# Patient Record
Sex: Female | Born: 1945 | Race: White | Hispanic: No | Smoking: Former smoker
Health system: Southern US, Community
[De-identification: ages and names within clinical notes are randomized; demographics above are authoritative.]

## PROBLEM LIST (undated history)

## (undated) DIAGNOSIS — S72002D Fracture of unspecified part of neck of left femur, subsequent encounter for closed fracture with routine healing: Secondary | ICD-10-CM

## (undated) DIAGNOSIS — S86912A Strain of unspecified muscle(s) and tendon(s) at lower leg level, left leg, initial encounter: Secondary | ICD-10-CM

---

## 2019-12-17 ENCOUNTER — Other Ambulatory Visit: Payer: Self-pay

## 2019-12-17 ENCOUNTER — Ambulatory Visit (INDEPENDENT_AMBULATORY_CARE_PROVIDER_SITE_OTHER): Payer: Medicare HMO

## 2019-12-17 ENCOUNTER — Ambulatory Visit
Admission: EM | Admit: 2019-12-17 | Discharge: 2019-12-17 | Disposition: A | Discharge status: Home / Self Care | Payer: Medicare HMO | Attending: Family Medicine | Admitting: Family Medicine

## 2019-12-17 DIAGNOSIS — R0602 Shortness of breath: Secondary | ICD-10-CM

## 2019-12-17 DIAGNOSIS — R059 Cough, unspecified: Secondary | ICD-10-CM

## 2019-12-17 DIAGNOSIS — J441 Chronic obstructive pulmonary disease with (acute) exacerbation: Secondary | ICD-10-CM

## 2019-12-17 DIAGNOSIS — Z760 Encounter for issue of repeat prescription: Secondary | ICD-10-CM

## 2019-12-17 MED ORDER — PREDNISONE 20 MG PO TABS: ORAL_TABLET | ORAL | 0 refills | Status: DC

## 2019-12-17 MED ORDER — ALBUTEROL SULFATE HFA 108 (90 BASE) MCG/ACT IN AERS
1.0000 | INHALATION_SPRAY | Freq: Four times a day (QID) | RESPIRATORY_TRACT | 5 refills | Status: DC | PRN
Start: 2019-12-17 — End: 2020-01-07

## 2019-12-17 MED ORDER — ALBUTEROL SULFATE HFA 108 (90 BASE) MCG/ACT IN AERS
1.0000 | INHALATION_SPRAY | Freq: Four times a day (QID) | RESPIRATORY_TRACT | 5 refills | Status: DC | PRN
Start: 2019-12-17 — End: 2019-12-17

## 2019-12-17 MED ORDER — AMOXICILLIN 875 MG PO TABS: 875.0000 mg | ORAL_TABLET | Freq: Two times a day (BID) | ORAL | 0 refills | Status: DC

## 2019-12-17 NOTE — ED Triage Notes (Signed)
Pt states she is in from [place] and is out of her inhaler and would like a refill. Pt is aox4 and ambulatory but gets dyspneic with exertion.

## 2019-12-17 NOTE — ED Provider Notes (Signed)
[facility]    CSN: [id number] Arrival date & time: 12/17/19  1225      History   Chief Complaint Chief Complaint  Patient presents with  . Medication Refill    Inhaler    HPI Heather Melendez is a 74 y.o. female.   Patient is here visiting her sister.  She currently resides in [place] and before that [place].  She has a rescue inhaler that is her only inhaler; no maintenance inhalers.  She denies fever or chills.  She denies heart problems.  She tells me she uses the inhaler on a regular basis.  HPI  History reviewed. No pertinent past medical history.  There are no problems to display for this patient.   History reviewed. No pertinent surgical history.  OB History   No obstetric history on file.      Home Medications    Prior to Admission medications   Not on File    Family History History reviewed. No pertinent family history.  Social History Social History   Tobacco Use  . Smoking status: Former Games developer  . Smokeless tobacco: Never Used  Vaping Use  . Vaping Use: Never used  Substance Use Topics  . Alcohol use: Not Currently  . Drug use: Never     Allergies   Patient has no known allergies.   Review of Systems Review of Systems  Respiratory: Positive for cough, shortness of breath and wheezing.   All other systems reviewed and are negative.    Physical Exam Triage Vital Signs ED Triage Vitals  Enc Vitals Group     BP 12/17/19 1240 (!) 159/91     Pulse Rate 12/17/19 1240 (!) 122     Resp 12/17/19 1240 (!) 22     Temp 12/17/19 1240 98.4 F (36.9 C)     Temp Source 12/17/19 1240 Oral     SpO2 --      Weight --      Height --      Head Circumference --      Peak Flow --      Pain Score 12/17/19 1243 0     Pain Loc --      Pain Edu? --      Excl. in GC? --    No data found.  Updated Vital Signs BP (!) 159/91 (BP Location: Left Arm)   Pulse (!) 122   Temp 98.4 F (36.9 C) (Oral)   Resp (!) 22   Visual  Acuity Right Eye Distance:   Left Eye Distance:   Bilateral Distance:    Right Eye Near:   Left Eye Near:    Bilateral Near:     Physical Exam Vitals and nursing note reviewed.  Constitutional:      Appearance: Normal appearance.  HENT:     Nose: Nose normal.     Mouth/Throat:     Mouth: Mucous membranes are moist.  Cardiovascular:     Rate and Rhythm: Normal rate.  Pulmonary:     Effort: Pulmonary effort is normal. No respiratory distress.     Breath sounds: Wheezing and rhonchi present.  Musculoskeletal:     Right lower leg: No edema.     Left lower leg: No edema.  Neurological:     Mental Status: She is alert.      UC Treatments / Results  Labs (all labs ordered are listed, but only abnormal results are displayed) Labs Reviewed - No data to display  EKG  Radiology No results found.  Procedures Procedures (including critical care time)  Medications Ordered in UC Medications - No data to display  Initial Impression / Assessment and Plan / UC Course  I have reviewed the triage vital signs and the nursing notes.  Pertinent labs & imaging results that were available during my care of the patient were reviewed by me and considered in my medical decision making (see chart for details).     Asthma.  Will refill albuterol inhaler without trying to talk her into maintenance medicine with inhaled steroid Final Clinical Impressions(s) / UC Diagnoses   Final diagnoses:  None   Discharge Instructions   None    ED Prescriptions    None     PDMP not reviewed this encounter.   Frederica Kuster, MD 12/24/19 [id number]

## 2019-12-17 NOTE — ED Provider Notes (Signed)
[facility]    CSN: [id number] Arrival date & time: 12/17/19  1225      History   Chief Complaint Chief Complaint  Patient presents with  . Medication Refill    Inhaler    HPI Heather Melendez is a 74 y.o. female.   This is a 74 year old woman who is making her first visit to this urgent care.  She has a long history of lung problems with a hospital admission a year ago.  She comes in for refill on her albuterol, noting that her breathing has been more difficult in the last few days.  She is visiting a relative at the present time.  She has no chest pain, ankle edema, GI symptoms.     History reviewed. No pertinent past medical history.  There are no problems to display for this patient.   History reviewed. No pertinent surgical history.  OB History   No obstetric history on file.      Home Medications    Prior to Admission medications   Medication Sig Start Date End Date Taking? Authorizing Provider  albuterol (VENTOLIN HFA) 108 (90 Base) MCG/ACT inhaler Inhale 1-2 puffs into the lungs every 6 (six) hours as needed for wheezing or shortness of breath. 12/17/19   Elvina Sidle, MD  amoxicillin (AMOXIL) 875 MG tablet Take 1 tablet (875 mg total) by mouth 2 (two) times daily. Take with food 12/17/19   Elvina Sidle, MD  predniSONE (DELTASONE) 20 MG tablet Two daily with food 12/17/19   Elvina Sidle, MD    Family History History reviewed. No pertinent family history.  Social History Social History   Tobacco Use  . Smoking status: Former Games developer  . Smokeless tobacco: Never Used  Vaping Use  . Vaping Use: Never used  Substance Use Topics  . Alcohol use: Not Currently  . Drug use: Never     Allergies   Patient has no known allergies.   Review of Systems Review of Systems  Constitutional: Positive for fatigue.  Respiratory: Positive for cough and wheezing.      Physical Exam Triage Vital Signs ED Triage Vitals  Enc Vitals  Group     BP 12/17/19 1240 (!) 159/91     Pulse Rate 12/17/19 1240 (!) 122     Resp 12/17/19 1240 (!) 22     Temp 12/17/19 1240 98.4 F (36.9 C)     Temp Source 12/17/19 1240 Oral     SpO2 --      Weight --      Height --      Head Circumference --      Peak Flow --      Pain Score 12/17/19 1243 0     Pain Loc --      Pain Edu? --      Excl. in GC? --    No data found.  Updated Vital Signs BP (!) 159/91 (BP Location: Left Arm)   Pulse (!) 122   Temp 98.4 F (36.9 C) (Oral)   Resp (!) 22    Physical Exam Vitals reviewed.  Constitutional:      General: She is not in acute distress.    Appearance: Normal appearance. She is normal weight. She is ill-appearing.  Eyes:     Conjunctiva/sclera: Conjunctivae normal.  Cardiovascular:     Rate and Rhythm: Tachycardia present.     Pulses: Normal pulses.  Pulmonary:     Effort: Pulmonary effort is normal.  Breath sounds: Wheezing, rhonchi and rales present.  Musculoskeletal:        General: Normal range of motion.     Cervical back: Normal range of motion and neck supple.  Skin:    General: Skin is warm and dry.  Neurological:     General: No focal deficit present.     Mental Status: She is alert.  Psychiatric:        Mood and Affect: Mood normal.        Thought Content: Thought content normal.      UC Treatments / Results  Labs (all labs ordered are listed, but only abnormal results are displayed) Labs Reviewed - No data to display  EKG   Radiology DG Chest 2 View  Result Date: 12/17/2019 CLINICAL DATA:  Cough.  Shortness of breath. EXAM: CHEST - 2 VIEW COMPARISON:  None. FINDINGS: The heart size and mediastinal contours are within normal limits. Both lungs are clear. No pneumothorax or pleural effusion is noted. Hyperexpansion of the lungs is noted. The visualized skeletal structures are unremarkable. IMPRESSION: No active cardiopulmonary disease. Hyperexpansion of the lungs suggesting chronic obstructive  pulmonary disease. Electronically Signed   By: Lupita Raider M.D.   On: 12/17/2019 13:21    Procedures Procedures (including critical care time)  Medications Ordered in UC Medications - No data to display  Initial Impression / Assessment and Plan / UC Course  I have reviewed the triage vital signs and the nursing notes.  Pertinent labs & imaging results that were available during my care of the patient were reviewed by me and considered in my medical decision making (see chart for details).     Final Clinical Impressions(s) / UC Diagnoses   Final diagnoses:  COPD exacerbation [facility])   Discharge Instructions   None    ED Prescriptions    Medication Sig Dispense Auth. Provider   predniSONE (DELTASONE) 20 MG tablet  (Status: Discontinued) Two daily with food 10 tablet Elvina Sidle, MD   albuterol (VENTOLIN HFA) 108 (90 Base) MCG/ACT inhaler  (Status: Discontinued) Inhale 1-2 puffs into the lungs every 6 (six) hours as needed for wheezing or shortness of breath. 16 g Elvina Sidle, MD   amoxicillin (AMOXIL) 875 MG tablet  (Status: Discontinued) Take 1 tablet (875 mg total) by mouth 2 (two) times daily. Take with food 20 tablet Elvina Sidle, MD   albuterol (VENTOLIN HFA) 108 (90 Base) MCG/ACT inhaler Inhale 1-2 puffs into the lungs every 6 (six) hours as needed for wheezing or shortness of breath. 16 g Elvina Sidle, MD   amoxicillin (AMOXIL) 875 MG tablet Take 1 tablet (875 mg total) by mouth 2 (two) times daily. Take with food 20 tablet Elvina Sidle, MD   predniSONE (DELTASONE) 20 MG tablet Two daily with food 10 tablet Elvina Sidle, MD     I have reviewed the PDMP during this encounter.   Elvina Sidle, MD 12/17/19 1342

## 2020-01-07 ENCOUNTER — Other Ambulatory Visit: Payer: Self-pay

## 2020-01-07 ENCOUNTER — Ambulatory Visit
Admission: EM | Admit: 2020-01-07 | Discharge: 2020-01-07 | Disposition: A | Discharge status: Home / Self Care | Payer: Medicare HMO | Attending: Emergency Medicine | Admitting: Emergency Medicine

## 2020-01-07 ENCOUNTER — Ambulatory Visit (INDEPENDENT_AMBULATORY_CARE_PROVIDER_SITE_OTHER): Payer: Medicare HMO

## 2020-01-07 DIAGNOSIS — J189 Pneumonia, unspecified organism: Secondary | ICD-10-CM

## 2020-01-07 DIAGNOSIS — R0602 Shortness of breath: Secondary | ICD-10-CM | POA: Diagnosis not present

## 2020-01-07 DIAGNOSIS — Z720 Tobacco use: Secondary | ICD-10-CM

## 2020-01-07 DIAGNOSIS — J441 Chronic obstructive pulmonary disease with (acute) exacerbation: Secondary | ICD-10-CM

## 2020-01-07 DIAGNOSIS — R059 Cough, unspecified: Secondary | ICD-10-CM | POA: Diagnosis not present

## 2020-01-07 MED ORDER — PREDNISONE 20 MG PO TABS: 40.0000 mg | ORAL_TABLET | Freq: Every day | ORAL | 0 refills | Status: AC

## 2020-01-07 MED ORDER — BENZONATATE 200 MG PO CAPS: 200.0000 mg | ORAL_CAPSULE | Freq: Three times a day (TID) | ORAL | 0 refills | Status: AC | PRN

## 2020-01-07 MED ORDER — AZITHROMYCIN 250 MG PO TABS: 250.0000 mg | ORAL_TABLET | Freq: Every day | ORAL | 0 refills | Status: AC

## 2020-01-07 MED ORDER — AMOXICILLIN-POT CLAVULANATE 875-125 MG PO TABS: 1.0000 | ORAL_TABLET | Freq: Two times a day (BID) | ORAL | 0 refills | Status: AC

## 2020-01-07 MED ORDER — ALBUTEROL SULFATE HFA 108 (90 BASE) MCG/ACT IN AERS
1.0000 | INHALATION_SPRAY | Freq: Four times a day (QID) | RESPIRATORY_TRACT | 0 refills | Status: AC | PRN
Start: 2020-01-07 — End: ?

## 2020-01-07 NOTE — ED Triage Notes (Signed)
Pt states she is sob and has been unable to see a primary care physician and is living in [place]. Pt has some rattling when breathing and gets dyspneic with exertion still. Pt states the inhaler helps a little. Pt is aox4 and ambulatory in line with the last visit.

## 2020-01-07 NOTE — Discharge Instructions (Signed)
X-ray does have area concerning for pneumonia Begin Augmentin twice daily for 1 week Azithromycin 2 tablets today, 1 tablet for the following 4 days Prednisone 40 mg daily to further help with inflammation and wheezing Continue albuterol inhaler as needed Tessalon for cough Rest and fluids Follow-up if not improving or worsening

## 2020-01-07 NOTE — ED Provider Notes (Signed)
[facility]    CSN: [id number] Arrival date & time: 01/07/20  1006      History   Chief Complaint Chief Complaint  Patient presents with   Shortness of Breath    X 2 weeks    HPI Heather Melendez is a 74 y.o. female presenting today for evaluation of shortness of breath and cough.  Patient reports over the past 3 weeks she has had cough and shortness of breath.  Reports over the past few days symptoms have increasingly worsened.  Denies any fevers.  Was seen here approximately 3 weeks ago and was started on amoxicillin, prednisone course and albuterol inhaler.  Symptoms did improve, but have worsened again.  Inhaler not fully resolving symptoms.   HPI  History reviewed. No pertinent past medical history.  There are no problems to display for this patient.   History reviewed. No pertinent surgical history.  OB History   No obstetric history on file.      Home Medications    Prior to Admission medications   Medication Sig Start Date End Date Taking? Authorizing Provider  albuterol (VENTOLIN HFA) 108 (90 Base) MCG/ACT inhaler Inhale 1-2 puffs into the lungs every 6 (six) hours as needed for wheezing or shortness of breath. 01/07/20   Laticia Vannostrand C, PA-C  amoxicillin-clavulanate (AUGMENTIN) 875-125 MG tablet Take 1 tablet by mouth every 12 (twelve) hours for 7 days. 01/07/20 01/14/20  Cindy Fullman C, PA-C  azithromycin (ZITHROMAX) 250 MG tablet Take 1 tablet (250 mg total) by mouth daily. Take first 2 tablets together, then 1 every day until finished. 01/07/20   Canio Winokur C, PA-C  benzonatate (TESSALON) 200 MG capsule Take 1 capsule (200 mg total) by mouth 3 (three) times daily as needed for up to 7 days for cough. 01/07/20 01/14/20  Nanako Stopher C, PA-C  predniSONE (DELTASONE) 20 MG tablet Take 2 tablets (40 mg total) by mouth daily with breakfast for 5 days. 01/07/20 01/12/20  Kionte Baumgardner, Junius Creamer, PA-C    Family History History reviewed. No  pertinent family history.  Social History Social History   Tobacco Use   Smoking status: Former Smoker   Smokeless tobacco: Never Used  Building services engineer Use: Never used  Substance Use Topics   Alcohol use: Not Currently   Drug use: Never     Allergies   Patient has no known allergies.   Review of Systems Review of Systems  Constitutional: Negative for activity change, appetite change, chills, fatigue and fever.  HENT: Positive for congestion. Negative for ear pain, rhinorrhea, sinus pressure, sore throat and trouble swallowing.   Eyes: Negative for discharge and redness.  Respiratory: Positive for cough, shortness of breath and wheezing. Negative for chest tightness.   Cardiovascular: Negative for chest pain.  Gastrointestinal: Negative for abdominal pain, diarrhea, nausea and vomiting.  Musculoskeletal: Negative for myalgias.  Skin: Negative for rash.  Neurological: Negative for dizziness, light-headedness and headaches.     Physical Exam Triage Vital Signs ED Triage Vitals  Enc Vitals Group     BP 01/07/20 1101 137/88     Pulse Rate 01/07/20 1101 (!) 111     Resp 01/07/20 1101 (!) 23     Temp 01/07/20 1101 98.2 F (36.8 C)     Temp Source 01/07/20 1101 Oral     SpO2 01/07/20 1101 94 %     Weight --      Height --      Head Circumference --  Peak Flow --      Pain Score 01/07/20 1103 0     Pain Loc --      Pain Edu? --      Excl. in GC? --    No data found.  Updated Vital Signs BP 137/88 (BP Location: Right Arm)    Pulse (!) 111    Temp 98.2 F (36.8 C) (Oral)    Resp (!) 23    SpO2 94%   Visual Acuity Right Eye Distance:   Left Eye Distance:   Bilateral Distance:    Right Eye Near:   Left Eye Near:    Bilateral Near:     Physical Exam Vitals and nursing note reviewed.  Constitutional:      Appearance: She is well-developed and well-nourished.     Comments: No acute distress  HENT:     Head: Normocephalic and atraumatic.      Ears:     Comments: Bilateral ears without tenderness to palpation of external auricle, tragus and mastoid, EAC's without erythema or swelling, TM's with good bony landmarks and cone of light. Non erythematous.     Nose: Nose normal.     Mouth/Throat:     Comments: Oral mucosa pink and moist, no tonsillar enlargement or exudate. Posterior pharynx patent and nonerythematous, no uvula deviation or swelling. Normal phonation. Eyes:     Conjunctiva/sclera: Conjunctivae normal.  Cardiovascular:     Rate and Rhythm: Normal rate.  Pulmonary:     Effort: Pulmonary effort is normal. No respiratory distress.     Comments: Crackles noted more prominently on left lower lung fields Abdominal:     General: There is no distension.  Musculoskeletal:        General: Normal range of motion.     Cervical back: Neck supple.  Skin:    General: Skin is warm and dry.  Neurological:     Mental Status: She is alert and oriented to person, place, and time.  Psychiatric:        Mood and Affect: Mood and affect normal.      UC Treatments / Results  Labs (all labs ordered are listed, but only abnormal results are displayed) Labs Reviewed - No data to display  EKG   Radiology DG Chest 2 View  Result Date: 01/07/2020 CLINICAL DATA:  cough x 3 weeks, hx tobacco use, worsening SOB EXAM: CHEST - 2 VIEW COMPARISON:  12/17/2019. FINDINGS: No pneumothorax or pleural effusion. Hazy right basilar opacities and peribronchial cuffing. Diffuse interstitial prominence. Stable cardiomediastinal silhouette. No acute osseous abnormality. IMPRESSION: Emphysema. Hazy right basilar opacities. Differential includes atelectasis, infection or sequela of aspiration. Peribronchial cuffing can be seen in bronchitis or asthma. Electronically Signed   By: Stana Bunting M.D.   On: 01/07/2020 11:50    Procedures Procedures (including critical care time)  Medications Ordered in UC Medications - No data to  display  Initial Impression / Assessment and Plan / UC Course  I have reviewed the triage vital signs and the nursing notes.  Pertinent labs & imaging results that were available during my care of the patient were reviewed by me and considered in my medical decision making (see chart for details).     Possible right basilar opacity noted along with emphysema.  Treating with Augmentin and azithromycin, prednisone course and continuing albuterol inhaler.  Tessalon for cough.  Rest and fluids.  O2 stable at this time, but is borderline, recommended to go to emergency room if any symptoms  progressing or worsening.  Discussed strict return precautions. Patient verbalized understanding and is agreeable with plan.  Final Clinical Impressions(s) / UC Diagnoses   Final diagnoses:  Community acquired pneumonia of right lower lobe of lung  COPD exacerbation (HCC)     Discharge Instructions     X-ray does have area concerning for pneumonia Begin Augmentin twice daily for 1 week Azithromycin 2 tablets today, 1 tablet for the following 4 days Prednisone 40 mg daily to further help with inflammation and wheezing Continue albuterol inhaler as needed Tessalon for cough Rest and fluids Follow-up if not improving or worsening    ED Prescriptions    Medication Sig Dispense Auth. Provider   amoxicillin-clavulanate (AUGMENTIN) 875-125 MG tablet Take 1 tablet by mouth every 12 (twelve) hours for 7 days. 14 tablet Rufus Cypert C, PA-C   azithromycin (ZITHROMAX) 250 MG tablet Take 1 tablet (250 mg total) by mouth daily. Take first 2 tablets together, then 1 every day until finished. 6 tablet Elnoria Livingston C, PA-C   benzonatate (TESSALON) 200 MG capsule Take 1 capsule (200 mg total) by mouth 3 (three) times daily as needed for up to 7 days for cough. 28 capsule Mykela Mewborn C, PA-C   predniSONE (DELTASONE) 20 MG tablet Take 2 tablets (40 mg total) by mouth daily with breakfast for 5 days. 10  tablet Nethan Caudillo C, PA-C   albuterol (VENTOLIN HFA) 108 (90 Base) MCG/ACT inhaler Inhale 1-2 puffs into the lungs every 6 (six) hours as needed for wheezing or shortness of breath. 18 g Karo Rog, Albright C, PA-C     PDMP not reviewed this encounter.   Lew Dawes, PA-C 01/07/20 1216

## 2020-03-20 ENCOUNTER — Inpatient Hospital Stay: Admit: 2020-03-20 | Discharge: 2020-03-20 | Disposition: A | Discharge status: Home / Self Care | Payer: MEDICARE

## 2020-03-20 ENCOUNTER — Emergency Department: Admit: 2020-03-20 | Payer: MEDICARE | Primary: Internal Medicine

## 2020-03-20 DIAGNOSIS — R0789 Other chest pain: Secondary | ICD-10-CM

## 2020-03-20 LAB — CBC WITH AUTOMATED DIFF
ABS. BASOPHILS: 0.1 10*3/uL (ref 0.0–0.1)
ABS. EOSINOPHILS: 0.3 10*3/uL (ref 0.0–0.4)
ABS. IMM. GRANS.: 0.1 10*3/uL — ABNORMAL HIGH (ref 0.00–0.04)
ABS. LYMPHOCYTES: 2.3 10*3/uL (ref 0.8–3.5)
ABS. MONOCYTES: 0.9 10*3/uL (ref 0.0–1.0)
ABS. NEUTROPHILS: 11.9 10*3/uL — ABNORMAL HIGH (ref 1.8–8.0)
ABSOLUTE NRBC: 0 10*3/uL (ref 0.00–0.01)
BASOPHILS: 1 % (ref 0–1)
EOSINOPHILS: 2 % (ref 0–7)
HCT: 46.3 % (ref 35.0–47.0)
HGB: 15.1 g/dL (ref 11.5–16.0)
IMMATURE GRANULOCYTES: 1 % — ABNORMAL HIGH (ref 0–0.5)
LYMPHOCYTES: 15 % (ref 12–49)
MCH: 30.8 PG (ref 26.0–34.0)
MCHC: 32.6 g/dL (ref 30.0–36.5)
MCV: 94.3 FL (ref 80.0–99.0)
MONOCYTES: 6 % (ref 5–13)
MPV: 8.8 FL — ABNORMAL LOW (ref 8.9–12.9)
NEUTROPHILS: 75 % (ref 32–75)
NRBC: 0 PER 100 WBC
PLATELET: 390 10*3/uL (ref 150–400)
RBC: 4.91 M/uL (ref 3.80–5.20)
RDW: 13.6 % (ref 11.5–14.5)
WBC: 15.5 10*3/uL — ABNORMAL HIGH (ref 3.6–11.0)

## 2020-03-20 LAB — METABOLIC PANEL, COMPREHENSIVE
A-G Ratio: 0.9 — ABNORMAL LOW (ref 1.1–2.2)
ALT (SGPT): 24 U/L (ref 12–78)
AST (SGOT): 16 U/L (ref 15–37)
Albumin: 3.9 g/dL (ref 3.5–5.0)
Alk. phosphatase: 100 U/L (ref 45–117)
Anion gap: 9 mmol/L (ref 5–15)
BUN/Creatinine ratio: 20 (ref 12–20)
BUN: 31 mg/dL — ABNORMAL HIGH (ref 6–20)
Bilirubin, total: 0.4 mg/dL (ref 0.2–1.0)
CO2: 24 mmol/L (ref 21–32)
Calcium: 9.3 mg/dL (ref 8.5–10.1)
Chloride: 106 mmol/L (ref 97–108)
Creatinine: 1.52 mg/dL — ABNORMAL HIGH (ref 0.55–1.02)
GFR est AA: 41 mL/min/{1.73_m2} — ABNORMAL LOW (ref >60)
GFR est non-AA: 34 mL/min/{1.73_m2} — ABNORMAL LOW (ref >60)
Globulin: 4.5 g/dL — ABNORMAL HIGH (ref 2.0–4.0)
Glucose: 115 mg/dL — ABNORMAL HIGH (ref 65–100)
Potassium: 3.7 mmol/L (ref 3.5–5.1)
Protein, total: 8.4 g/dL — ABNORMAL HIGH (ref 6.4–8.2)
Sodium: 139 mmol/L (ref 136–145)

## 2020-03-20 LAB — TROPONIN-HIGH SENSITIVITY: Troponin-High Sensitivity: 8 ng/L (ref 0–51)

## 2020-03-20 LAB — CBC WITH AUTO DIFFERENTIAL
Basophils %: 1 % (ref 0–1)
Basophils Absolute: 0.1 10*3/uL (ref 0.0–0.1)
Eosinophils %: 2 % (ref 0–7)
Eosinophils Absolute: 0.3 10*3/uL (ref 0.0–0.4)
Granulocyte Absolute Count: 0.1 10*3/uL — ABNORMAL HIGH (ref 0.00–0.04)
Hematocrit: 46.3 % (ref 35.0–47.0)
Hemoglobin: 15.1 g/dL (ref 11.5–16.0)
Immature Granulocytes: 1 % — ABNORMAL HIGH (ref 0–0.5)
Lymphocytes %: 15 % (ref 12–49)
Lymphocytes Absolute: 2.3 10*3/uL (ref 0.8–3.5)
MCH: 30.8 PG (ref 26.0–34.0)
MCHC: 32.6 g/dL (ref 30.0–36.5)
MCV: 94.3 FL (ref 80.0–99.0)
MPV: 8.8 FL — ABNORMAL LOW (ref 8.9–12.9)
Monocytes %: 6 % (ref 5–13)
Monocytes Absolute: 0.9 10*3/uL (ref 0.0–1.0)
NRBC Absolute: 0 10*3/uL (ref 0.00–0.01)
Neutrophils %: 75 % (ref 32–75)
Neutrophils Absolute: 11.9 10*3/uL — ABNORMAL HIGH (ref 1.8–8.0)
Nucleated RBCs: 0 PER 100 WBC
Platelets: 390 10*3/uL (ref 150–400)
RBC: 4.91 M/uL (ref 3.80–5.20)
RDW: 13.6 % (ref 11.5–14.5)
WBC: 15.5 10*3/uL — ABNORMAL HIGH (ref 3.6–11.0)

## 2020-03-20 LAB — COMPREHENSIVE METABOLIC PANEL
ALT: 24 U/L (ref 12–78)
AST: 16 U/L (ref 15–37)
Albumin/Globulin Ratio: 0.9 — ABNORMAL LOW (ref 1.1–2.2)
Albumin: 3.9 g/dL (ref 3.5–5.0)
Alkaline Phosphatase: 100 U/L (ref 45–117)
Anion Gap: 9 mmol/L (ref 5–15)
BUN: 31 mg/dL — ABNORMAL HIGH (ref 6–20)
Bun/Cre Ratio: 20 (ref 12–20)
CO2: 24 mmol/L (ref 21–32)
Calcium: 9.3 mg/dL (ref 8.5–10.1)
Chloride: 106 mmol/L (ref 97–108)
Creatinine: 1.52 mg/dL — ABNORMAL HIGH (ref 0.55–1.02)
EGFR IF NonAfrican American: 34 mL/min/{1.73_m2} — ABNORMAL LOW (ref >60)
GFR African American: 41 mL/min/{1.73_m2} — ABNORMAL LOW (ref >60)
Globulin: 4.5 g/dL — ABNORMAL HIGH (ref 2.0–4.0)
Glucose: 115 mg/dL — ABNORMAL HIGH (ref 65–100)
Potassium: 3.7 mmol/L (ref 3.5–5.1)
Sodium: 139 mmol/L (ref 136–145)
Total Bilirubin: 0.4 mg/dL (ref 0.2–1.0)
Total Protein: 8.4 g/dL — ABNORMAL HIGH (ref 6.4–8.2)

## 2020-03-20 LAB — TROPONIN, HIGH SENSITIVITY: Troponin, High Sensitivity: 8 ng/L (ref 0–51)

## 2020-03-20 MED ORDER — KETOROLAC TROMETHAMINE 10 MG TAB
10 mg | ORAL_TABLET | Freq: Four times a day (QID) | ORAL | 0 refills | Status: AC | PRN
Start: 2020-03-20 — End: 2020-03-23

## 2020-03-20 MED ORDER — KETOROLAC TROMETHAMINE 30 MG/ML INJECTION
30 mg/mL (1 mL) | Freq: Once | INTRAMUSCULAR | Status: AC
Start: 2020-03-20 — End: 2020-03-20
  Administered 2020-03-20: 15:00:00 via INTRAVENOUS

## 2020-03-20 MED ORDER — SODIUM CHLORIDE 0.9% BOLUS IV
0.9 % | Freq: Once | INTRAVENOUS | Status: AC
Start: 2020-03-20 — End: 2020-03-20
  Administered 2020-03-20: 15:00:00 via INTRAVENOUS

## 2020-03-20 MED FILL — KETOROLAC TROMETHAMINE 30 MG/ML INJECTION: 30 mg/mL (1 mL) | INTRAMUSCULAR | Qty: 1

## 2020-03-20 MED FILL — SODIUM CHLORIDE 0.9 % IV: INTRAVENOUS | Qty: 1000

## 2020-03-20 NOTE — ED Notes (Signed)
Pt c/o chest pain.. Reports that she fell the other day hurting her back but this am she started having chest pain.Marland Kitchen Unsure if cracked her rib or just what is wrong. Hurts with movement.

## 2020-03-20 NOTE — ED Provider Notes (Signed)
ED Provider Notes by Margot ChimesPidcoe, Koren Plyler P, PA-C at 03/20/20 [phone]                Author: Margot ChimesPidcoe, Soleil Mas P, PA-C  Service: Emergency Medicine  Author Type: Physician Assistant       Filed: 03/22/20 2234  Date of Service: 03/20/20 0816  Status: Attested           Editor: Magenta Schmiesing, Burtis JunesJoshua P, PA-C (Physician Assistant)  Cosigner: Tressie EllisShayya, Sami, MD at 03/22/20 2259          Attestation signed by Tressie EllisShayya, Sami, MD at 03/22/20 2259          I was personally available for consultation in the emergency department. The APP did not have questions for me or request to have me see the patient. I have  reviewed the chart and agree with the documentation as recorded by the APP, including the assessment, treatment plan, and disposition.      Tressie EllisSami Shayya, MD                                    EMERGENCY DEPARTMENT HISTORY AND PHYSICAL EXAM           Date: 03/20/2020   Patient Name: [name]        History of Presenting Illness          Chief Complaint       Patient presents with        ?  Chest Pain           History Provided By: Patient      HPI: [name],  75 y.o. female with a past medical history significant for HTN and COPD who presents to  the ED with cc of chest pain.  Patient reports onset of pain after sustaining a fall 2 days ago.  States that she tripped over her dresser and fell to the floor landing on her back.  Denies hitting her head or any loss of consciousness with the event.   States that she initially had some achy back pain, which radiates around to her right chest wall.  States that her pain is exacerbated with movement and mildly relieved while at rest.  Denies treating her symptoms with anything.  Patient denies any associated  symptoms including any shortness of breath, lightheadedness, dizziness, weakness, numbness, near syncope, diaphoresis, or nausea.  She states that she is otherwise well and has no further concerns.  Pacifically denies any fevers, chills, cough, congestion,  sore throat, abdominal  pain, vomiting, diarrhea, difficulty urinating, dysuria, hematuria, frequency, saddle anesthesia, urinary/fecal incontinence, headaches, or rash.      There are no other complaints, changes, or physical findings at this time.      PCP: Cioflec, Doran Durandaniela G, MD        No current facility-administered medications on file prior to encounter.          No current outpatient medications on file prior to encounter.             Past History        Past Medical History:     Past Medical History:        Diagnosis  Date         ?  Chronic obstructive pulmonary disease (HCC)       ?  Hypercholesteremia       ?  Insomnia           ?  Sleep disorder             Past Surgical History:   History reviewed. No pertinent surgical history.      Family History:   History reviewed. No pertinent family history.      Social History:     Social History          Tobacco Use         ?  Smoking status:  Former Smoker     ?  Smokeless tobacco:  Never Used       Substance Use Topics         ?  Alcohol use:  Not Currently         ?  Drug use:  Never           Allergies:     Allergies        Allergen  Reactions         ?  Cheese  Nausea and Vomiting             Severe vomiting                Review of Systems        Review of Systems    Constitutional: Negative.  Negative for chills, diaphoresis and fever.    HENT: Negative.  Negative for congestion, rhinorrhea and sore throat.     Eyes: Negative.     Respiratory: Negative.  Negative for cough, chest tightness, shortness of breath and wheezing.     Cardiovascular: Positive for chest pain. Negative for palpitations.    Gastrointestinal: Negative.  Negative for abdominal pain, diarrhea, nausea and vomiting.    Genitourinary: Negative.  Negative for difficulty urinating, dysuria, flank pain, frequency and hematuria.    Musculoskeletal: Positive for back pain and myalgias .    Skin: Negative.  Negative for rash.    Neurological: Negative.  Negative for dizziness, syncope, weakness, light-headedness,  numbness and headaches.    Psychiatric/Behavioral: Negative.     All other systems reviewed and are negative.           Physical Exam        Physical Exam   Vitals and nursing note reviewed.   Constitutional:        General: She is not in acute distress.     Appearance: Normal appearance. She is not ill-appearing or toxic-appearing.    HENT:       Head: Normocephalic and atraumatic.      Mouth/Throat:      Mouth: Mucous membranes are moist.      Pharynx: Oropharynx is clear.   Eyes:       Extraocular Movements: Extraocular movements intact.      Pupils: Pupils are equal, round, and reactive to light.   Cardiovascular:       Rate and Rhythm: Normal rate and regular rhythm.      Pulses: Normal pulses.      Heart sounds: Normal heart sounds. No murmur heard.   No friction rub. No gallop.     Pulmonary:       Effort: Pulmonary effort is normal.      Breath sounds: Normal breath sounds. No wheezing, rhonchi or rales.   Chest:       Chest wall: Tenderness (right lateral chest wall)  present. No deformity or crepitus.   Abdominal :      General: There is no distension.      Palpations: Abdomen is soft.  Tenderness: There is no abdominal tenderness. There is no guarding or rebound.     Musculoskeletal:      Cervical back: Neck supple. No tenderness.      Right lower leg: No edema.      Left lower leg: No edema.    Skin:      General: Skin is warm and dry.      Findings: No rash.    Neurological:       General: No focal deficit present.      Mental Status: She is alert and oriented to person, place, and time.    Psychiatric:         Mood and Affect: Mood normal.         Behavior: Behavior normal.               Lab and Diagnostic Study Results        Labs -       No results found for this or any previous visit (from the past 12 hour(s)).      Radiologic Studies -    @     CT Results   (Last 48 hours)          None                 CXR Results   (Last 48 hours)          None                       Medical  Decision Making     - I am the first provider for this patient.      - I reviewed the vital signs, available nursing notes, past medical history, past surgical history, family history and social history.      - Initial assessment performed. The patients presenting problems have been discussed, and they are in agreement with the care plan formulated and outlined with them.  I have encouraged them to ask questions as they arise throughout their visit.      Vital Signs-Reviewed the patient's vital signs.   No data found.      Records Reviewed: Nursing Notes and Old Medical Records             Provider Notes (Medical Decision Making):       MDM   Number of Diagnoses or Management Options   Atypical chest pain   Diagnosis management comments: DDx: atypical chest pain, ACS, costochondritis, PNA, bronchitis, CHF, pleural effusion, MSK pain, GERD, pancreatitis      Pt presents with acute chest pain; stable vitals and nontoxic appearing.       The pt is unlikely to have PE. There is no pleuritic chest pain, no tachycardia, no hypoxia, pt non-smoker, no unilateral leg swelling, no hormone use, no recent travel/immobilization, no recent surgery. Therefore no d-dimer or PE      The pt is unlikely to have aortic dissection. The pulses are equal, there is no sharp tearing chest pain radiating to back, the patient is not extremely hypertensive. Therefore no d-dimer or CTA chest for dissection      Patient not extremely hypertensive, unlikely to be hypertensive emergency.  No history of valve abnormality and no harsh murmur, no signs of flash pulmonary edema, therefore less likely ruptured valve.      Endocarditis unlikely -- no IV drug abuse, native valve, no fevers, patient well appearing, no murmurs/splinter hemorrhages/janeway lesions or oslar nodes  The pt is unlikely to have esophageal rupture. There is no history of forceful vomiting, patient well appearing, no difficulty swallowing      Will pursue cardiac work-up with  EKG, troponin, CXR. While the pt is less likely to have pneumonia as there is no cough and no fever, and less likely to have ptx, will order CXR to assess lung fields. Will provide pain control and reassess.          Amount and/or Complexity of Data Reviewed   Clinical lab tests: ordered and reviewed   Tests in the radiology section of CPT??: ordered and reviewed   Tests in the medicine section of CPT??: ordered and reviewed    Review and summarize past medical records: yes    Independent visualization of images, tracings, or specimens: yes             ED Course:    10:24 AM   Pt has been reassessed and updated on all results and findings.  Patient reports pain is significantly improved and is requesting to be discharged home.  Her work-up has been unremarkable and she will be discharged upon completion of IV fluids.  She has  been educated on strict return precautions conveys good understanding and agreement with care plan as outlined.  Patient has no additional concerns and all questions have been answered.           Procedures     Medical Decision Makingedical Decision Making   Performed by: Margot Chimes, PA-C   PROCEDURES:   Procedures            Disposition     Disposition: DC- Adult Discharges: All of the diagnostic tests were reviewed and questions answered. Diagnosis, care  plan and treatment options were discussed.  The patient understands the instructions and will follow up as directed. The patients results have been reviewed with them.  They have been counseled regarding their diagnosis.  The patient verbally convey understanding  and agreement of the signs, symptoms, diagnosis, treatment and prognosis and additionally agrees to follow up as recommended with their PCP in 24 - 48 hours.  They also agree with the care-plan and convey that all of their questions have been answered.   I have also put together some discharge instructions for them that include: 1) educational information regarding their  diagnosis, 2) how to care for their diagnosis at home, as well a 3) list of reasons why they would want to return to the ED prior to  their follow-up appointment, should their condition change.            DISCHARGE PLAN:   1. There are no discharge medications for this patient.      2.      Follow-up Information               Follow up With  Specialties  Details  Why  Contact Info              Cioflec, Doran Durand, MD  Internal Medicine    As needed  [address]   [address]   [place] [address]   [phone]                3.  Return to ED if worse    4.      Discharge Medication List as of 03/20/2020 11:31 AM  Diagnosis        Clinical Impression:       1.  Atypical chest pain            Attestations:      Burtis Junes Kamaryn Grimley, PA-C      Please note that this dictation was completed with Dragon, the computer voice recognition software.  Quite often unanticipated grammatical, syntax, homophones, and other interpretive errors are inadvertently  transcribed by the computer software.  Please disregard these errors.  Please excuse any errors that have escaped final proofreading.  Thank you.

## 2020-03-20 NOTE — ED Notes (Signed)
Pt understood d/c paperwork and instructions. Pt 22 g  iv was removed and pt ambulated out of ED with family by side.

## 2020-03-21 LAB — EKG, 12 LEAD, INITIAL
Atrial Rate: 122 {beats}/min
Calculated P Axis: 44 degrees
Calculated R Axis: -14 degrees
Calculated T Axis: 29 degrees
P-R Interval: 144 ms
Q-T Interval: 310 ms
QRS Duration: 64 ms
QTC Calculation (Bezet): 441 ms
Ventricular Rate: 122 {beats}/min

## 2020-03-21 LAB — EKG 12-LEAD
Atrial Rate: 122 {beats}/min
P Axis: 44 degrees
P-R Interval: 144 ms
Q-T Interval: 310 ms
QRS Duration: 64 ms
QTc Calculation (Bazett): 441 ms
R Axis: -14 degrees
T Axis: 29 degrees
Ventricular Rate: 122 {beats}/min

## 2020-11-27 ENCOUNTER — Inpatient Hospital Stay
Admit: 2020-11-27 | Discharge: 2020-11-27 | Disposition: A | Discharge status: Skilled Nursing Facility | Payer: MEDICARE | Attending: Emergency Medicine

## 2020-11-27 DIAGNOSIS — W19XXXA Unspecified fall, initial encounter: Secondary | ICD-10-CM

## 2020-11-27 LAB — CBC WITH AUTOMATED DIFF
ABS. BASOPHILS: 0.1 10*3/uL (ref 0.0–0.1)
ABS. EOSINOPHILS: 0.1 10*3/uL (ref 0.0–0.4)
ABS. IMM. GRANS.: 0.1 10*3/uL — ABNORMAL HIGH (ref 0.00–0.04)
ABS. LYMPHOCYTES: 3.3 10*3/uL (ref 0.8–3.5)
ABS. MONOCYTES: 0.8 10*3/uL (ref 0.0–1.0)
ABS. NEUTROPHILS: 7.5 10*3/uL (ref 1.8–8.0)
ABSOLUTE NRBC: 0 10*3/uL (ref 0.00–0.01)
BASOPHILS: 0 % (ref 0–1)
EOSINOPHILS: 1 % (ref 0–7)
HCT: 38.2 % (ref 35.0–47.0)
HGB: 12.3 g/dL (ref 11.5–16.0)
IMMATURE GRANULOCYTES: 0 % (ref 0–0.5)
LYMPHOCYTES: 28 % (ref 12–49)
MCH: 31.1 PG (ref 26.0–34.0)
MCHC: 32.2 g/dL (ref 30.0–36.5)
MCV: 96.7 FL (ref 80.0–99.0)
MONOCYTES: 7 % (ref 5–13)
MPV: 8.7 FL — ABNORMAL LOW (ref 8.9–12.9)
NEUTROPHILS: 64 % (ref 32–75)
NRBC: 0 PER 100 WBC
PLATELET: 368 10*3/uL (ref 150–400)
RBC: 3.95 M/uL (ref 3.80–5.20)
RDW: 13.5 % (ref 11.5–14.5)
WBC: 11.8 10*3/uL — ABNORMAL HIGH (ref 3.6–11.0)

## 2020-11-27 LAB — METABOLIC PANEL, COMPREHENSIVE
A-G Ratio: 1.3 (ref 1.1–2.2)
ALT (SGPT): 24 U/L (ref 12–78)
AST (SGOT): 24 U/L (ref 15–37)
Albumin: 4 g/dL (ref 3.5–5.0)
Alk. phosphatase: 71 U/L (ref 45–117)
Anion gap: 9 mmol/L (ref 5–15)
BUN/Creatinine ratio: 20 (ref 12–20)
BUN: 20 mg/dL (ref 6–20)
Bilirubin, total: 0.7 mg/dL (ref 0.2–1.0)
CO2: 23 mmol/L (ref 21–32)
Calcium: 9.4 mg/dL (ref 8.5–10.1)
Chloride: 105 mmol/L (ref 97–108)
Creatinine: 1 mg/dL (ref 0.55–1.02)
Globulin: 3 g/dL (ref 2.0–4.0)
Glucose: 88 mg/dL (ref 65–100)
Potassium: 3.1 mmol/L — ABNORMAL LOW (ref 3.5–5.1)
Protein, total: 7 g/dL (ref 6.4–8.2)
Sodium: 137 mmol/L (ref 136–145)
eGFR: 59 mL/min/{1.73_m2} — ABNORMAL LOW (ref >60)

## 2020-11-27 LAB — EKG, 12 LEAD, INITIAL
Atrial Rate: 95 {beats}/min
Calculated P Axis: 66 degrees
Calculated R Axis: 2 degrees
Calculated T Axis: 41 degrees
Diagnosis: NORMAL
P-R Interval: 158 ms
Q-T Interval: 394 ms
QRS Duration: 68 ms
QTC Calculation (Bezet): 495 ms
Ventricular Rate: 95 {beats}/min

## 2020-11-27 LAB — TROPONIN-HIGH SENSITIVITY: Troponin-High Sensitivity: 17 ng/L (ref 0–51)

## 2020-11-27 LAB — MAGNESIUM
Magnesium: 2.2 mg/dL (ref 1.6–2.4)
Magnesium: 2.2 mg/dL (ref 1.6–2.4)

## 2020-11-27 LAB — PROTHROMBIN TIME + INR
INR: 0.9 (ref 0.9–1.1)
Prothrombin time: 12.7 s (ref 11.9–14.6)

## 2020-11-27 LAB — CBC WITH AUTO DIFFERENTIAL
Basophils %: 0 % (ref 0–1)
Basophils Absolute: 0.1 10*3/uL (ref 0.0–0.1)
Eosinophils %: 1 % (ref 0–7)
Eosinophils Absolute: 0.1 10*3/uL (ref 0.0–0.4)
Granulocyte Absolute Count: 0.1 10*3/uL — ABNORMAL HIGH (ref 0.00–0.04)
Hematocrit: 38.2 % (ref 35.0–47.0)
Hemoglobin: 12.3 g/dL (ref 11.5–16.0)
Immature Granulocytes: 0 % (ref 0–0.5)
Lymphocytes %: 28 % (ref 12–49)
Lymphocytes Absolute: 3.3 10*3/uL (ref 0.8–3.5)
MCH: 31.1 PG (ref 26.0–34.0)
MCHC: 32.2 g/dL (ref 30.0–36.5)
MCV: 96.7 FL (ref 80.0–99.0)
MPV: 8.7 FL — ABNORMAL LOW (ref 8.9–12.9)
Monocytes %: 7 % (ref 5–13)
Monocytes Absolute: 0.8 10*3/uL (ref 0.0–1.0)
NRBC Absolute: 0 10*3/uL (ref 0.00–0.01)
Neutrophils %: 64 % (ref 32–75)
Neutrophils Absolute: 7.5 10*3/uL (ref 1.8–8.0)
Nucleated RBCs: 0 PER 100 WBC
Platelets: 368 10*3/uL (ref 150–400)
RBC: 3.95 M/uL (ref 3.80–5.20)
RDW: 13.5 % (ref 11.5–14.5)
WBC: 11.8 10*3/uL — ABNORMAL HIGH (ref 3.6–11.0)

## 2020-11-27 LAB — COMPREHENSIVE METABOLIC PANEL
ALT: 24 U/L (ref 12–78)
AST: 24 U/L (ref 15–37)
Albumin/Globulin Ratio: 1.3 (ref 1.1–2.2)
Albumin: 4 g/dL (ref 3.5–5.0)
Alkaline Phosphatase: 71 U/L (ref 45–117)
Anion Gap: 9 mmol/L (ref 5–15)
BUN: 20 mg/dL (ref 6–20)
Bun/Cre Ratio: 20 (ref 12–20)
CO2: 23 mmol/L (ref 21–32)
Calcium: 9.4 mg/dL (ref 8.5–10.1)
Chloride: 105 mmol/L (ref 97–108)
Creatinine: 1 mg/dL (ref 0.55–1.02)
ESTIMATED GLOMERULAR FILTRATION RATE: 59 mL/min/{1.73_m2} — ABNORMAL LOW (ref >60)
Globulin: 3 g/dL (ref 2.0–4.0)
Glucose: 88 mg/dL (ref 65–100)
Potassium: 3.1 mmol/L — ABNORMAL LOW (ref 3.5–5.1)
Sodium: 137 mmol/L (ref 136–145)
Total Bilirubin: 0.7 mg/dL (ref 0.2–1.0)
Total Protein: 7 g/dL (ref 6.4–8.2)

## 2020-11-27 LAB — EKG 12-LEAD
Atrial Rate: 95 {beats}/min
Diagnosis: NORMAL
P Axis: 66 degrees
P-R Interval: 158 ms
Q-T Interval: 394 ms
QRS Duration: 68 ms
QTc Calculation (Bazett): 495 ms
R Axis: 2 degrees
T Axis: 41 degrees
Ventricular Rate: 95 {beats}/min

## 2020-11-27 LAB — PROTIME-INR
INR: 0.9 (ref 0.9–1.1)
Protime: 12.7 s (ref 11.9–14.6)

## 2020-11-27 LAB — TROPONIN, HIGH SENSITIVITY: Troponin, High Sensitivity: 17 ng/L (ref 0–51)

## 2020-11-27 MED ORDER — ACETAMINOPHEN 325 MG TABLET
325 mg | Freq: Four times a day (QID) | ORAL | Status: AC | PRN
Start: 2020-11-27 — End: 2020-11-28
  Administered 2020-11-27: 18:00:00 via ORAL

## 2020-11-27 MED ORDER — POTASSIUM CHLORIDE SR 20 MEQ TAB, PARTICLES/CRYSTALS
20 mEq | Freq: Every day | ORAL | Status: AC
Start: 2020-11-27 — End: 2020-12-01
  Administered 2020-11-27 – 2020-12-01 (×5): via ORAL

## 2020-11-27 MED ORDER — ACETAMINOPHEN 650 MG RECTAL SUPPOSITORY
650 mg | Freq: Four times a day (QID) | RECTAL | Status: AC | PRN
Start: 2020-11-27 — End: 2020-11-28

## 2020-11-27 MED ORDER — TIOTROPIUM BROMIDE 2.5 MCG/ACTUATION MIST FOR INHALATION
2.5 mcg/actuation | Freq: Every day | RESPIRATORY_TRACT | Status: DC
Start: 2020-11-27 — End: 2020-12-01
  Administered 2020-11-27 – 2020-12-01 (×4): via RESPIRATORY_TRACT

## 2020-11-27 MED ORDER — SODIUM CHLORIDE 0.9 % IJ SYRG
INTRAMUSCULAR | Status: AC | PRN
Start: 2020-11-27 — End: 2020-12-01

## 2020-11-27 MED ORDER — ENOXAPARIN 40 MG/0.4 ML SUB-Q SYRINGE
40 mg/0.4 mL | Freq: Every day | SUBCUTANEOUS | Status: DC
Start: 2020-11-27 — End: 2020-11-27

## 2020-11-27 MED ORDER — ATORVASTATIN 20 MG TAB
20 mg | Freq: Every day | ORAL | Status: AC
Start: 2020-11-27 — End: 2020-12-01
  Administered 2020-11-27 – 2020-12-01 (×5): via ORAL

## 2020-11-27 MED ORDER — METHOCARBAMOL 500 MG TAB
500 mg | Freq: Every evening | ORAL | Status: AC | PRN
Start: 2020-11-27 — End: 2020-11-30

## 2020-11-27 MED ORDER — ONDANSETRON (PF) 4 MG/2 ML INJECTION
42 mg/2 mL | Freq: Four times a day (QID) | INTRAMUSCULAR | Status: DC | PRN
Start: 2020-11-27 — End: 2020-12-01

## 2020-11-27 MED ORDER — SODIUM CHLORIDE 0.9 % IJ SYRG
Freq: Three times a day (TID) | INTRAMUSCULAR | Status: AC
Start: 2020-11-27 — End: 2020-12-01
  Administered 2020-11-27 – 2020-12-01 (×14): via INTRAVENOUS

## 2020-11-27 MED ORDER — ONDANSETRON 4 MG TAB, RAPID DISSOLVE
4 mg | Freq: Three times a day (TID) | ORAL | Status: DC | PRN
Start: 2020-11-27 — End: 2020-12-01

## 2020-11-27 MED ORDER — ALBUTEROL SULFATE HFA 90 MCG/ACTUATION AEROSOL INHALER
90 mcg/actuation | RESPIRATORY_TRACT | Status: AC | PRN
Start: 2020-11-27 — End: 2020-12-01

## 2020-11-27 MED ORDER — OXYCODONE 5 MG TAB
5 mg | ORAL | Status: AC | PRN
Start: 2020-11-27 — End: 2020-11-30
  Administered 2020-11-27 – 2020-11-28 (×2): via ORAL

## 2020-11-27 MED ORDER — PANTOPRAZOLE 40 MG TAB, DELAYED RELEASE
40 mg | Freq: Every day | ORAL | Status: AC
Start: 2020-11-27 — End: 2020-12-01
  Administered 2020-11-27 – 2020-12-01 (×5): via ORAL

## 2020-11-27 MED ORDER — BUDESONIDE-FORMOTEROL HFA 160 MCG-4.5 MCG/ACTUATION AEROSOL INHALER
Freq: Two times a day (BID) | RESPIRATORY_TRACT | Status: AC
Start: 2020-11-27 — End: 2020-12-01
  Administered 2020-11-27 – 2020-12-01 (×8): via RESPIRATORY_TRACT

## 2020-11-27 MED ORDER — POLYETHYLENE GLYCOL 3350 17 GRAM (100 %) ORAL POWDER PACKET
17 gram | Freq: Every day | ORAL | Status: DC | PRN
Start: 2020-11-27 — End: 2020-12-01

## 2020-11-27 MED FILL — PANTOPRAZOLE 40 MG TAB, DELAYED RELEASE: 40 mg | ORAL | Qty: 1

## 2020-11-27 MED FILL — OXYCODONE 5 MG TAB: 5 mg | ORAL | Qty: 1

## 2020-11-27 MED FILL — POTASSIUM CHLORIDE SR 20 MEQ TAB, PARTICLES/CRYSTALS: 20 mEq | ORAL | Qty: 2

## 2020-11-27 MED FILL — SYMBICORT 160 MCG-4.5 MCG/ACTUATION HFA AEROSOL INHALER: RESPIRATORY_TRACT | Qty: 6

## 2020-11-27 MED FILL — TYLENOL 325 MG TABLET: 325 mg | ORAL | Qty: 2

## 2020-11-27 MED FILL — SODIUM CHLORIDE 0.9 % IJ SYRG: INTRAMUSCULAR | Qty: 40

## 2020-11-27 MED FILL — SPIRIVA RESPIMAT 2.5 MCG/ACTUATION SOLUTION FOR INHALATION: 2.5 mcg/actuation | RESPIRATORY_TRACT | Qty: 4

## 2020-11-27 MED FILL — ATORVASTATIN 20 MG TAB: 20 mg | ORAL | Qty: 1

## 2020-11-27 NOTE — ED Provider Notes (Signed)
ED Provider Notes by Marcelyn Bruins, MD at 11/27/20 [phone]                Author: Marcelyn Bruins, MD  Service: Emergency Medicine  Author Type: Physician       Filed: 11/27/20 2116  Date of Service: 11/27/20 0331  Status: Signed          Editor: Marcelyn Bruins, MD (Physician)               2EMERGENCY DEPARTMENT HISTORY AND PHYSICAL EXAM           Date: 11/27/2020   Patient Name: [name]        History of Presenting Illness          Chief Complaint       Patient presents with        ?  Fall           History Provided By: Patient      HPI: [name], 75 y.o. female with a past medical history significant  PD, hypertension, paroxysmal A. fib reportedly on a blood thinner   presents to the ED with cc of fall.  Patient states that she occasionally falls due to balance issues, today was going to bathroom when she lost her balance fell forward striking her head, denies any loss of consciousness, denies any neck pain or stiffness.   Patient able to stand after event.  Currently patient complaining of pain to her left hip, denies any chest pain shortness of breath, no abdominal pain nausea or vomiting.      There are no other complaints, changes, or physical findings at this time.      PCP: Cioflec, Doran Durand, MD        No current facility-administered medications on file prior to encounter.          Current Outpatient Medications on File Prior to Encounter          Medication  Sig  Dispense  Refill           ?  fluticasone-umeclidin-vilanter (TRELEGY ELLIPTA) 200-62.5-25 mcg inhaler  Trelegy Ellipta 200 mcg-62.5 mcg-25 mcg powder for inhalation    INHALE 1 PUFF BY MOUTH ONCE A DAY         ?  albuterol (PROVENTIL HFA, VENTOLIN HFA, PROAIR HFA) 90 mcg/actuation inhaler  albuterol sulfate HFA 90 mcg/actuation aerosol inhaler    INHALE 2 PUFFS EVERY 6 HOURS AS NEEDED         ?  methocarbamoL (ROBAXIN) 500 mg tablet  Take 500 mg by mouth nightly as needed.         ?  omeprazole (PRILOSEC) 20 mg capsule  Take  20 mg by mouth daily.               ?  rosuvastatin (CRESTOR) 10 mg tablet  Take 10 mg by mouth daily.                 Past History        Past Medical History:     Past Medical History:        Diagnosis  Date         ?  Chronic obstructive pulmonary disease (HCC)       ?  Hypercholesteremia       ?  Insomnia           ?  Sleep disorder  Past Surgical History:   History reviewed. No pertinent surgical history.      Family History:   History reviewed. No pertinent family history.      Social History:     Social History          Tobacco Use         ?  Smoking status:  Former              Packs/day:  1.00         Types:  Cigarettes         Quit date:  2021         Years since quitting:  1.8         Passive exposure:  Past         ?  Smokeless tobacco:  Never       Vaping Use         ?  Vaping Use:  Never used       Substance Use Topics         ?  Alcohol use:  Yes              Alcohol/week:  1.0 standard drink         Types:  1 Cans of beer per week             Comment: 1 can of beer every 1-2 weeks         ?  Drug use:  Never           Allergies:     Allergies        Allergen  Reactions         ?  Cheese  Nausea and Vomiting             Severe vomiting                Review of Systems        Review of Systems    Constitutional:  Negative for activity change, chills, fatigue and fever.    HENT:  Negative for congestion and trouble swallowing.     Eyes:  Negative for photophobia and visual disturbance.    Respiratory:  Negative for cough, chest tightness and shortness of breath.     Cardiovascular:  Negative for chest pain and palpitations.    Gastrointestinal:  Negative for abdominal distention, abdominal pain, diarrhea, nausea and vomiting.    Genitourinary:  Negative for dysuria, flank pain and frequency.    Musculoskeletal:  Positive for arthralgias. Negative for back pain and myalgias.    Skin:  Negative for color change, pallor and rash.    Neurological:  Negative for dizziness, tremors, weakness and  headaches.    Psychiatric/Behavioral:  Negative for confusion.          Physical Exam        Physical Exam   Vitals and nursing note reviewed.    Constitutional:        General: She is not in acute distress.      Appearance: Normal appearance. She is normal weight. She is not ill-appearing.    HENT:       Head: Normocephalic.            Nose: Nose normal.       Mouth/Throat:       Mouth: Mucous membranes are moist.    Eyes:       Extraocular Movements: Extraocular movements intact.  Pupils: Pupils are equal, round, and reactive to light.     Cardiovascular:       Rate and Rhythm: Normal rate and regular rhythm.       Pulses: Normal pulses.    Pulmonary:       Effort: Pulmonary effort is normal.     Abdominal:       General: Abdomen is flat. Bowel sounds are normal.       Palpations: Abdomen is soft.       Tenderness: There is no abdominal tenderness. There is no guarding.     Musculoskeletal:          General: No tenderness. Normal range of motion.       Cervical back: Normal range of motion and neck supple. No muscular tenderness.    Skin:      General: Skin is warm and dry.    Neurological:       General: No focal deficit present.       Mental Status: She is alert and oriented to person, place, and time.       Sensory: No sensory deficit.       Motor: No weakness.            Diagnostic Study Results        Labs -         Recent Results (from the past 12 hour(s))     PROTHROMBIN TIME + INR          Collection Time: 11/27/20 12:39 PM         Result  Value  Ref Range            Prothrombin time  12.7  11.9 - 14.6 sec       INR  0.9  0.9 - 1.1         MAGNESIUM          Collection Time: 11/27/20 12:39 PM         Result  Value  Ref Range            Magnesium  2.2  1.6 - 2.4 mg/dL           Radiologic Studies -    @lastxrresult @     CT Results  (Last 48 hours)                                       11/27/20 0307    CT HEAD WO CONT  Final result            Impression:    No acute intracranial hemorrhage. Large left  frontal scalp hematoma.                                     Narrative:    EXAM: CT HEAD WO CONT             INDICATION: s/p fall, patient on anticoagulants, r/o ICH             COMPARISON: None.             CONTRAST: None.             TECHNIQUE: Unenhanced CT of the head was performed using 5 mm images. Brain and      bone windows were generated. Coronal and sagittal reformats.  CT dose reduction      was achieved through use of a standardized protocol tailored for this      examination and automatic exposure control for dose modulation.               FINDINGS:      The ventricles and sulci are normal in size, shape and configuration.. There is      no significant white matter disease. There is no intracranial hemorrhage,      extra-axial collection, or mass effect. The basilar cisterns are open. No CT      evidence of acute infarct.             The bone windows demonstrate no abnormalities. The visualized portions of the      paranasal sinuses and mastoid air cells are clear. Large left frontal scalp      hematoma.                                      CXR Results  (Last 48 hours)                                       11/27/20 0236    XR CHEST PORT  Final result            Impression:           No acute cardiopulmonary process. Questionable spiculated opacity in the left      upper lobe. Recommend follow-up imaging.                              Narrative:    EXAM:  XR CHEST PORT             INDICATION: Status post fall             COMPARISON: 03/20/2020             TECHNIQUE: Upright portable chest AP view             FINDINGS: Cardiac monitoring leads. The cardiac silhouette is within normal      limits. The pulmonary vasculature is within normal limits.              Questionable spiculated opacity in the left upper lobe. The visualized bones and      upper abdomen are age-appropriate.                                            Medical Decision Making     I am the first provider for this patient.      I reviewed the  vital signs, available nursing notes, past medical history, past surgical history, family history and social history.      Vital Signs-Reviewed the patient's vital signs.   Patient Vitals for the past 12 hrs:            Temp  Pulse  Resp  BP  SpO2            11/27/20 2038  98.2 ??F (36.8 ??C)  (!) 106  22  93/62  96 %  11/27/20 1946  --  (!) 105  --  --  --     11/27/20 1924  --  --  --  --  95 %     11/27/20 1530  98.1 ??F (36.7 ??C)  (!) 105  22  (!) 96/58  97 %            11/27/20 1155  98.3 ??F (36.8 ??C)  (!) 102  20  106/69  98 %           Records Reviewed: Nursing Notes      The patient presents with fall, head injury with a differential diagnosis of cranial hemorrhage, left hip injury, gait instability,          Provider Notes (Medical Decision Making):       MDM          75 year old female history of hypertension COPD, reportedly is on a blood thinner presents to emergency department for mechanical fall this evening.    Patient states that she has fallen multiple  times over the last several weeks, has old ecchymotic regions over head and face, patient denies any loss of consciousness today, is complaining of mild left hip pain.    Patient awake alert oriented, nonfocal neurological exam, stable vitals,  mild left greater trochanter tenderness palpation no obvious deformity.    Will obtain head CT, chest x-ray, left hip x-rays.      ED Course:    Initial assessment performed. The patients presenting problems have been discussed, and they are in agreement with the care plan formulated and outlined with them.  I have encouraged them to ask questions as they arise throughout their visit.        ED Course as of 11/27/20 2113       Sat Nov 27, 2020        0340  CT head shows no acute intracranial injury, hip and chest x-ray shows no acute injury, will discharge patient home.  [PZ]     [id number]  Patient reassessed, states that she has been falling frequently over the last week, describes as sudden episodes of  dizziness, with possible loss of consciousness.   Patient fell on 59  Discussed case with Dr. Willey Blade, will admit patient for weakness, possible syncope.   [PZ]              ED Course User Index   [  PZ] Marcelyn Bruins, MD              PROCEDURES   Procedures             PLAN:   1.      Current Discharge Medication List               2.      Follow-up Information                  Follow up With  Specialties  Details  Why  Contact Info              Cioflec, Doran Durand, MD  Internal Medicine Physician  In 1 week  As needed  [address]   [address]   [place] [address]   [phone]                     Return to ED if worse         Diagnosis        Clinical Impression:       1.  Fall, initial encounter      2.  Contusion of scalp, initial encounter      3.  Weakness         4.  Syncope and collapse
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
## 2020-11-27 NOTE — Progress Notes (Signed)
Progress  Notes by Vira Browns, NP at 11/27/20 1209                Author: Vira Browns, NP  Service: Internal Medicine  Author Type: Nurse Practitioner       Filed: 11/27/20 1215  Date of Service: 11/27/20 1209  Status: Signed          Editor: Vira Browns, NP (Nurse Practitioner)                        Hospitalist Progress Note          Vira Browns, APRN, FNP-C      Daily Progress Note: 11/27/2020         Subjective:    Subjective    Patient examined alert and oriented lying in bed.. she reports multiple falls at home. Reports living home alone since passing of her spouse.       Review of Systems:    Review of Systems    Constitutional:  Negative for chills and fever.    Respiratory:  Negative for cough and shortness of breath.     Cardiovascular:  Negative for chest pain.    Gastrointestinal:  Negative for abdominal pain, nausea and vomiting.    Musculoskeletal:  Positive for falls and joint pain .    Neurological:  Positive for weakness.          Objective:    Objective        Vitals:   Patient Vitals for the past 12 hrs:            Temp  Pulse  Resp  BP  SpO2            11/27/20 1155  98.3 ??F (36.8 ??C)  (!) 102  20  106/69  98 %            11/27/20 0900  98.3 ??F (36.8 ??C)  (!) 113  19  118/78  99 %     11/27/20 0805  --  --  --  --  96 %     11/27/20 0552  --  --  --  (!) 146/92  98 %     11/27/20 0529  --  --  --  125/85  99 %     11/27/20 0429  --  --  --  (!) 145/91  --     11/27/20 0329  --  --  --  128/84  98 %     11/27/20 0313  --  --  --  123/84  98 %            11/27/20 0218  98.2 ??F (36.8 ??C)  94  18  120/84  97 %               Physical Exam   Vitals and nursing note reviewed.    Cardiovascular:       Rate and Rhythm: Tachycardia present.    Pulmonary:       Breath sounds: Normal breath sounds.     Abdominal:       General: Bowel sounds are normal.    Skin:      Findings: Bruising present.       Comments: Bilateral ey bruising, left frontal hematoma bruising, bilateral arm bruising     Neurological:       Mental Status: She is alert and oriented to person, place, and time.  Lab Results:     Recent Results (from the past 24 hour(s))     CBC WITH AUTOMATED DIFF          Collection Time: 11/27/20  4:24 AM         Result  Value  Ref Range            WBC  11.8 (H)  3.6 - 11.0 K/uL       RBC  3.95  3.80 - 5.20 M/uL       HGB  12.3  11.5 - 16.0 g/dL       HCT  38.2  35.0 - 47.0 %       MCV  96.7  80.0 - 99.0 FL       MCH  31.1  26.0 - 34.0 PG       MCHC  32.2  30.0 - 36.5 g/dL       RDW  13.5  11.5 - 14.5 %       PLATELET  368  150 - 400 K/uL       MPV  8.7 (L)  8.9 - 12.9 FL       NRBC  0.0  0.0 PER 100 WBC       ABSOLUTE NRBC  0.00  0.00 - 0.01 K/uL       NEUTROPHILS  64  32 - 75 %       LYMPHOCYTES  28  12 - 49 %       MONOCYTES  7  5 - 13 %       EOSINOPHILS  1  0 - 7 %       BASOPHILS  0  0 - 1 %       IMMATURE GRANULOCYTES  0  0 - 0.5 %       ABS. NEUTROPHILS  7.5  1.8 - 8.0 K/UL       ABS. LYMPHOCYTES  3.3  0.8 - 3.5 K/UL       ABS. MONOCYTES  0.8  0.0 - 1.0 K/UL       ABS. EOSINOPHILS  0.1  0.0 - 0.4 K/UL            ABS. BASOPHILS  0.1  0.0 - 0.1 K/UL            ABS. IMM. GRANS.  0.1 (H)  0.00 - 0.04 K/UL       DF  AUTOMATED          METABOLIC PANEL, COMPREHENSIVE          Collection Time: 11/27/20  4:24 AM         Result  Value  Ref Range            Sodium  137  136 - 145 mmol/L       Potassium  3.1 (L)  3.5 - 5.1 mmol/L       Chloride  105  97 - 108 mmol/L       CO2  23  21 - 32 mmol/L       Anion gap  9  5 - 15 mmol/L       Glucose  88  65 - 100 mg/dL       BUN  20  6 - 20 mg/dL       Creatinine  1.00  0.55 - 1.02 mg/dL       BUN/Creatinine ratio  20  12 - 20  eGFR  59 (L)  >60 ml/min/1.41m       Calcium  9.4  8.5 - 10.1 mg/dL       Bilirubin, total  0.7  0.2 - 1.0 mg/dL       AST (SGOT)  24  15 - 37 U/L       ALT (SGPT)  24  12 - 78 U/L       Alk. phosphatase  71  45 - 117 U/L       Protein, total  7.0  6.4 - 8.2 g/dL       Albumin  4.0  3.5 - 5.0 g/dL       Globulin  3.0   2.0 - 4.0 g/dL       A-G Ratio  1.3  1.1 - 2.2         TROPONIN-HIGH SENSITIVITY          Collection Time: 11/27/20  4:24 AM         Result  Value  Ref Range            Troponin-High Sensitivity  17  0 - 51 ng/L           Results              ** No results found for the last 336 hours. **                      Diagnostic Images:     CT Results  (Last 48 hours)                                       11/27/20 0307    CT HEAD WO CONT  Final result            Impression:    No acute intracranial hemorrhage. Large left frontal scalp hematoma.                                     Narrative:    EXAM: CT HEAD WO CONT             INDICATION: s/p fall, patient on anticoagulants, r/o ICH             COMPARISON: None.             CONTRAST: None.             TECHNIQUE: Unenhanced CT of the head was performed using 5 mm images. Brain and      bone windows were generated. Coronal and sagittal reformats. CT dose reduction      was achieved through use of a standardized protocol tailored for this      examination and automatic exposure control for dose modulation.               FINDINGS:      The ventricles and sulci are normal in size, shape and configuration.. There is      no significant white matter disease. There is no intracranial hemorrhage,      extra-axial collection, or mass effect. The basilar cisterns are open. No CT      evidence of acute infarct.             The bone windows demonstrate no abnormalities. The visualized portions of the  paranasal sinuses and mastoid air cells are clear. Large left frontal scalp      hematoma.                                          CT HEAD WO CONT       Final Result     No acute intracranial hemorrhage. Large left frontal scalp hematoma.                      XR CHEST PORT       Final Result          No acute cardiopulmonary process. Questionable spiculated opacity in the left     upper lobe. Recommend follow-up imaging.                 XR HIP LT W OR WO PELV 2-3 VWS       Final Result      No acute abnormality.                           Current Medications:      Current Facility-Administered Medications:    ?  albuterol (PROVENTIL HFA, VENTOLIN HFA, PROAIR HFA) inhaler 2 Puff, 2 Puff, Inhalation, Q4H PRN, Cha, Paula Compton, MD   ?  methocarbamoL (ROBAXIN) tablet 500 mg, 500 mg, Oral, QHS PRN, Cha, Paula Compton, MD   ?  pantoprazole (PROTONIX) tablet 40 mg, 40 mg, Oral, ACB, Cha, Paula Compton, MD, 40 mg at 11/27/20 4967   ?  atorvastatin (LIPITOR) tablet 20 mg, 20 mg, Oral, DAILY, Cha, Paula Compton, MD, 20 mg at 11/27/20 5916   ?  budesonide-formoteroL (SYMBICORT) 160-4.5 mcg/actuation HFA inhaler 1 Puff, 1 Puff, Inhalation, BID RT, 1 Puff at 11/27/20 0805 **AND**  tiotropium bromide (SPIRIVA RESPIMAT) 2.5 mcg /actuation, 2 Puff, Inhalation, DAILY, Cha, Paula Compton, MD, 2 Puff at 11/27/20 0800   ?  sodium chloride (NS) flush 5-40 mL, 5-40 mL, IntraVENous, Q8H, Cha, Paula Compton, MD, 10 mL at 11/27/20 3846   ?  sodium chloride (NS) flush 5-40 mL, 5-40 mL, IntraVENous, PRN, Cha, Paula Compton, MD   ?  acetaminophen (TYLENOL) tablet 650 mg, 650 mg, Oral, Q6H PRN **OR** acetaminophen (TYLENOL) suppository 650 mg, 650 mg, Rectal, Q6H PRN,  Cha, Paula Compton, MD   ?  polyethylene glycol (MIRALAX) packet 17 g, 17 g, Oral, DAILY PRN, Cha, Paula Compton, MD   ?  ondansetron (ZOFRAN ODT) tablet 4 mg, 4 mg, Oral, Q8H PRN **OR** ondansetron (ZOFRAN) injection 4 mg, 4 mg, IntraVENous, Q6H PRN, Cha,  Paula Compton, MD   ?  potassium chloride (K-DUR, KLOR-CON M20) SR tablet 40 mEq, 40 mEq, Oral, DAILY, Cha, Paula Compton, MD, 40 mEq at 11/27/20 6599          ASSESSMENT:         Paitlyn Mcclatchey is a 75 y.o. female with PMH of COPD, HLP, GERD and insomnia. Patient presented to the ED with chief complaint of recurrent falls. She reports has been having dizziness, worsening  by standing up. Also associated with mild palpitation, generalized weakness and fall. She typically doesn't loss consciousness however did loss consciousness last Sunday and hit her head. Did  not recall the details of fall. She denies history of heart  disease including atrial fibrillation or seizure. Does report to  have been eating and drinking poorly since husband passed recently.        In the ED, vital signs stable. Mild hypokalemia and leukocytosis. CT head showed no acute intracranial hemorrhage. Obtain therapy eval. Admit to observation services.            # Orthostatic hypotension   - PT/OT eval   - orthostatic vital signs       # Syncope   - suspect secondary to above. Low suspicious of cardiac syncope based on history   - monitor on telemetry.        # Ambulatory dysfunction   - pt/ot eval   CM for dispo planning      # Hypokalemia   - repletion ordered   - monitor        # COPD   - not in exacerbation   - Continue home medications.        # HLP   - Continue home medications.          Full Code   Dvt Prophylaxis none   GI Prophylaxis protonix            Above treatment plan reviewed and discussed with patient in detail at bedside, all questions answered.      Care Plan discussed with: Patient/Family      Total time spent with patient: 30 minutes.      Vira Browns, NP

## 2020-11-27 NOTE — H&P (Signed)
History and Physical    Patient: [name] MRN: [id number]  SSN: [id number]    Date of Birth: [birth year]  Age: 75 y.o.  Sex: female      Subjective:      Boluwatife Mutchler is a 75 y.o. female with PMH of COPD, HLP, GERD and insomnia. Patient presented to the ED with chief complaint of recurrent falls. She reports has been having dizziness, worsening by standing up. Also associated with mild palpitation, generalized weakness and fall. She typically doesn't loss consciousness however did loss consciousness last Sunday and hit her head. Did not recall the details of fall. She denies history of heart disease including atrial fibrillation or seizure. Does report to have been eating and drinking poorly since husband passed recently.     In the ED, vital signs stable. Mild hypokalemia and leukocytosis. CT head showed no acute intracranial hemorrhage.     Past Medical History:   Diagnosis Date    Chronic obstructive pulmonary disease ([place])     Hypercholesteremia     Insomnia     Sleep disorder      No past surgical history on file.   History reviewed. No pertinent family history.  Social History     Tobacco Use    Smoking status: Former    Smokeless tobacco: Never   Substance Use Topics    Alcohol use: Not Currently      Prior to Admission medications    Medication Sig Start Date End Date Taking? Authorizing Provider   fluticasone-umeclidin-vilanter (TRELEGY ELLIPTA) 200-62.5-25 mcg inhaler Trelegy Ellipta 200 mcg-62.5 mcg-25 mcg powder for inhalation   INHALE 1 PUFF BY MOUTH ONCE A DAY 08/19/20  Yes Provider, Historical   omeprazole (PRILOSEC) 20 mg capsule Take 20 mg by mouth daily. 08/11/20  Yes Provider, Historical   albuterol (PROVENTIL HFA, VENTOLIN HFA, PROAIR HFA) 90 mcg/actuation inhaler albuterol sulfate HFA 90 mcg/actuation aerosol inhaler   INHALE 2 PUFFS EVERY 6 HOURS AS NEEDED    Provider, Historical   methocarbamoL (ROBAXIN) 500 mg tablet Take 500 mg by mouth nightly as needed. 10/21/20   Provider, Historical    rosuvastatin (CRESTOR) 10 mg tablet Take 10 mg by mouth daily.    Provider, Historical        Allergies   Allergen Reactions    Cheese Nausea and Vomiting     Severe vomiting       Review of Systems:   Constitutional: No fevers, No chills, No fatigue, No weakness  Eyes: No visual disturbance  Ears, Nose, Mouth, Throat, and Face: No nasal congestion, No sore throat  Respiratory: No cough, No sputum, No wheezing, No SOB  Cardiovascular: No chest pain, No lower extremity edema, No Palpitations   Gastrointestinal: No nausea, No vomiting, No diarrhea, No constipation, No abdominal pain  Genitourinary: No frequency, No dysuria, No hematuria  Integument/Breast: No rash, No skin lesion(s), No dryness  Musculoskeletal: No arthralgias, No neck pain, No back pain  Neurological: See HPI   Behavioral/Psychiatric: No anxiety, No depression      Objective:     Vitals:    11/27/20 0329 11/27/20 0429 11/27/20 0529 11/27/20 0552   BP: 128/84 (!) 145/91 125/85 (!) 146/92   Pulse:       Resp:       Temp:       SpO2: 98%  99% 98%   Weight:       Height:            Physical Exam:  General: alert, cooperative, no distress  Head: Large left frontal scalp hematoma.noted with hematoma surrounding the eyes bilaterally   Eye: conjunctivae/corneas clear. PERRL, EOM's intact.   Throat and Neck: normal and no erythema or exudates noted. No mass   Lung: clear to auscultation bilaterally  Heart: regular rate and rhythm,   Abdomen: soft, non-tender. Bowel sounds normal. No masses,  Extremities:  able to move all extremities normal, atraumatic  Skin: Normal.  Neurologic: AOx3. Motor function and sensation grossly intact.  Psychiatric: non focal    Recent Results (from the past 24 hour(s))   CBC WITH AUTOMATED DIFF    Collection Time: 11/27/20  4:24 AM   Result Value Ref Range    WBC 11.8 (H) 3.6 - 11.0 K/uL    RBC 3.95 3.80 - 5.20 M/uL    HGB 12.3 11.5 - 16.0 g/dL    HCT 38.2 35.0 - 47.0 %    MCV 96.7 80.0 - 99.0 FL    MCH 31.1 26.0 - 34.0 PG     MCHC 32.2 30.0 - 36.5 g/dL    RDW 13.5 11.5 - 14.5 %    PLATELET 368 150 - 400 K/uL    MPV 8.7 (L) 8.9 - 12.9 FL    NRBC 0.0 0.0 PER 100 WBC    ABSOLUTE NRBC 0.00 0.00 - 0.01 K/uL    NEUTROPHILS 64 32 - 75 %    LYMPHOCYTES 28 12 - 49 %    MONOCYTES 7 5 - 13 %    EOSINOPHILS 1 0 - 7 %    BASOPHILS 0 0 - 1 %    IMMATURE GRANULOCYTES 0 0 - 0.5 %    ABS. NEUTROPHILS 7.5 1.8 - 8.0 K/UL    ABS. LYMPHOCYTES 3.3 0.8 - 3.5 K/UL    ABS. MONOCYTES 0.8 0.0 - 1.0 K/UL    ABS. EOSINOPHILS 0.1 0.0 - 0.4 K/UL    ABS. BASOPHILS 0.1 0.0 - 0.1 K/UL    ABS. IMM. GRANS. 0.1 (H) 0.00 - 0.04 K/UL    DF AUTOMATED     METABOLIC PANEL, COMPREHENSIVE    Collection Time: 11/27/20  4:24 AM   Result Value Ref Range    Sodium 137 136 - 145 mmol/L    Potassium 3.1 (L) 3.5 - 5.1 mmol/L    Chloride 105 97 - 108 mmol/L    CO2 23 21 - 32 mmol/L    Anion gap 9 5 - 15 mmol/L    Glucose 88 65 - 100 mg/dL    BUN 20 6 - 20 mg/dL    Creatinine 1.00 0.55 - 1.02 mg/dL    BUN/Creatinine ratio 20 12 - 20      eGFR 59 (L) >60 ml/min/1.40m    Calcium 9.4 8.5 - 10.1 mg/dL    Bilirubin, total 0.7 0.2 - 1.0 mg/dL    AST (SGOT) 24 15 - 37 U/L    ALT (SGPT) 24 12 - 78 U/L    Alk. phosphatase 71 45 - 117 U/L    Protein, total 7.0 6.4 - 8.2 g/dL    Albumin 4.0 3.5 - 5.0 g/dL    Globulin 3.0 2.0 - 4.0 g/dL    A-G Ratio 1.3 1.1 - 2.2     TROPONIN-HIGH SENSITIVITY    Collection Time: 11/27/20  4:24 AM   Result Value Ref Range    Troponin-High Sensitivity 17 0 - 51 ng/L       XR Results (maximum last 3):  Results from  Hospital Encounter encounter on 11/27/20    XR HIP LT W OR WO PELV 2-3 VWS    Narrative  EXAM: XR HIP LT W OR WO PELV 2-3 VWS    INDICATION: sp fall, L hip pain.    COMPARISON: None.    FINDINGS: AP view of the pelvis and a frogleg lateral view of the left hip  demonstrate no fracture, dislocation or other acute abnormality.    Impression  No acute abnormality.      XR CHEST PORT    Narrative  EXAM:  XR CHEST PORT    INDICATION: Status post  fall    COMPARISON: 03/20/2020    TECHNIQUE: Upright portable chest AP view    FINDINGS: Cardiac monitoring leads. The cardiac silhouette is within normal  limits. The pulmonary vasculature is within normal limits.    Questionable spiculated opacity in the left upper lobe. The visualized bones and  upper abdomen are age-appropriate.    Impression  No acute cardiopulmonary process. Questionable spiculated opacity in the left  upper lobe. Recommend follow-up imaging.      Results from [place] encounter on 03/20/20    XR CHEST PORT    Narrative  Chest one view.    AP upright view at 0902 dated 03/20/2020.    The heart size and pulmonary blood flow are normal. There is tortuosity in the  aorta.    There is minor volume loss/scar in the left lower lobe. There is no pleural  fluid or pneumothorax.    Deformity of the posterior right sixth rib is likely related to remote trauma.  There is no evidence for an acute displaced rib fracture.    Impression  No acute skeletal abnormality.      CT Results (maximum last 3):  Results from [place] encounter on 11/27/20    CT HEAD WO CONT    Narrative  EXAM: CT HEAD WO CONT    INDICATION: s/p fall, patient on anticoagulants, r/o ICH    COMPARISON: None.    CONTRAST: None.    TECHNIQUE: Unenhanced CT of the head was performed using 5 mm images. Brain and  bone windows were generated. Coronal and sagittal reformats. CT dose reduction  was achieved through use of a standardized protocol tailored for this  examination and automatic exposure control for dose modulation.    FINDINGS:  The ventricles and sulci are normal in size, shape and configuration.. There is  no significant white matter disease. There is no intracranial hemorrhage,  extra-axial collection, or mass effect. The basilar cisterns are open. No CT  evidence of acute infarct.    The bone windows demonstrate no abnormalities. The visualized portions of the  paranasal sinuses and mastoid air cells are clear.  Large left frontal scalp  hematoma.    Impression  No acute intracranial hemorrhage. Large left frontal scalp hematoma.      MRI Results (maximum last 3):  No results found for this or any previous visit.      Nuclear Medicine Results (maximum last 3):  No results found for this or any previous visit.      [place] Results (maximum last 3):  No results found for this or any previous visit.      Assessment and plan:   # Orthostatic hypotension  - Admit to obs  - PT/OT  - orthostatic vital signs    # Syncope  - suspect secondary to above. Low suspicious of cardiac syncope based on history  -  monitor on telemetry.     # Hypokalemia  - repletion ordered  - monitor     # COPD  - not in exacerbation  - Continue home medications.     # HLP  - Continue home medications.      # Full code by default, need further clarification    # Medication reconciliation incomplete, appreciate assistance from pharmacy or nursing staff        Signed By: Weldon Inches, MD     November 27, 2020

## 2020-11-27 NOTE — Progress Notes (Unsigned)
Problem: Falls - Risk of  Goal: *Absence of Falls  Description: Document [name] Fall Risk and appropriate interventions in the flowsheet.  Outcome: Progressing Towards Goal  Note: Fall Risk Interventions:  Mobility Interventions: Bed/chair exit alarm, Patient to call before getting OOB    Mentation Interventions: Bed/chair exit alarm, Door open when patient unattended    Medication Interventions: Bed/chair exit alarm    Elimination Interventions: Bed/chair exit alarm    History of Falls Interventions: Bed/chair exit alarm, Door open when patient unattended, Room close to nurse's station         Problem: Patient Education: Go to Patient Education Activity  Goal: Patient/Family Education  Outcome: Progressing Towards Goal

## 2020-11-27 NOTE — ED Notes (Signed)
Report sheet tubed to 4W

## 2020-11-27 NOTE — ED Notes (Signed)
EMS reports GLF with unknown LOC, takes blood thinner. Trauma bravo activated.  Presents with bruising to both eyes, with hematoma above left eye. Pt alert to self, GCS 15. Pt stays by herself

## 2020-11-28 LAB — METABOLIC PANEL, BASIC
Anion gap: 6 mmol/L (ref 5–15)
BUN/Creatinine ratio: 24 — ABNORMAL HIGH (ref 12–20)
BUN: 23 mg/dL — ABNORMAL HIGH (ref 6–20)
CO2: 23 mmol/L (ref 21–32)
Calcium: 9 mg/dL (ref 8.5–10.1)
Chloride: 110 mmol/L — ABNORMAL HIGH (ref 97–108)
Creatinine: 0.95 mg/dL (ref 0.55–1.02)
Glucose: 154 mg/dL — ABNORMAL HIGH (ref 65–100)
Potassium: 3.9 mmol/L (ref 3.5–5.1)
Sodium: 139 mmol/L (ref 136–145)
eGFR: >60 mL/min/{1.73_m2} (ref >60)

## 2020-11-28 LAB — BASIC METABOLIC PANEL
Anion Gap: 6 mmol/L (ref 5–15)
BUN: 23 mg/dL — ABNORMAL HIGH (ref 6–20)
Bun/Cre Ratio: 24 — ABNORMAL HIGH (ref 12–20)
CO2: 23 mmol/L (ref 21–32)
Calcium: 9 mg/dL (ref 8.5–10.1)
Chloride: 110 mmol/L — ABNORMAL HIGH (ref 97–108)
Creatinine: 0.95 mg/dL (ref 0.55–1.02)
ESTIMATED GLOMERULAR FILTRATION RATE: >60 mL/min/{1.73_m2} (ref >60)
Glucose: 154 mg/dL — ABNORMAL HIGH (ref 65–100)
Potassium: 3.9 mmol/L (ref 3.5–5.1)
Sodium: 139 mmol/L (ref 136–145)

## 2020-11-28 MED ORDER — ACETAMINOPHEN 325 MG TABLET
325 mg | Freq: Four times a day (QID) | ORAL | Status: DC
Start: 2020-11-28 — End: 2020-11-30
  Administered 2020-11-28 – 2020-11-30 (×8): via ORAL

## 2020-11-28 MED ORDER — ACETAMINOPHEN 650 MG RECTAL SUPPOSITORY
650 mg | Freq: Four times a day (QID) | RECTAL | Status: DC
Start: 2020-11-28 — End: 2020-11-30

## 2020-11-28 MED ORDER — MELATONIN 3 MG TAB
3 mg | Freq: Every evening | ORAL | Status: AC
Start: 2020-11-28 — End: 2020-12-01
  Administered 2020-11-28 – 2020-12-01 (×4): via ORAL

## 2020-11-28 MED FILL — OXYCODONE 5 MG TAB: 5 mg | ORAL | Qty: 1

## 2020-11-28 MED FILL — TYLENOL 325 MG TABLET: 325 mg | ORAL | Qty: 2

## 2020-11-28 MED FILL — PANTOPRAZOLE 40 MG TAB, DELAYED RELEASE: 40 mg | ORAL | Qty: 1

## 2020-11-28 MED FILL — POTASSIUM CHLORIDE SR 20 MEQ TAB, PARTICLES/CRYSTALS: 20 mEq | ORAL | Qty: 2

## 2020-11-28 MED FILL — ATORVASTATIN 20 MG TAB: 20 mg | ORAL | Qty: 1

## 2020-11-28 MED FILL — MELATONIN 3 MG TAB: 3 mg | ORAL | Qty: 1

## 2020-11-28 NOTE — Progress Notes (Signed)
Problem: Falls - Risk of  Goal: *Absence of Falls  Description: Document Schmid Fall Risk and appropriate interventions in the flowsheet.  Outcome: Progressing Towards Goal  Note: Fall Risk Interventions:  Mobility Interventions: Bed/chair exit alarm    Mentation Interventions: Bed/chair exit alarm    Medication Interventions: Bed/chair exit alarm    Elimination Interventions: Bed/chair exit alarm    History of Falls Interventions: Bed/chair exit alarm         Problem: Patient Education: Go to Patient Education Activity  Goal: Patient/Family Education  Outcome: Progressing Towards Goal     Problem: Patient Education: Go to Patient Education Activity  Goal: Patient/Family Education  Outcome: Progressing Towards Goal

## 2020-11-28 NOTE — Progress Notes (Deleted)
Problem: Falls - Risk of  Goal: *Absence of Falls  Description: Document Schmid Fall Risk and appropriate interventions in the flowsheet.  Outcome: Progressing Towards Goal  Note: Fall Risk Interventions:  Mobility Interventions: Bed/chair exit alarm    Mentation Interventions: Bed/chair exit alarm    Medication Interventions: Bed/chair exit alarm    Elimination Interventions: Bed/chair exit alarm    History of Falls Interventions: Bed/chair exit alarm         Problem: Patient Education: Go to Patient Education Activity  Goal: Patient/Family Education  Outcome: Progressing Towards Goal

## 2020-11-28 NOTE — Progress Notes (Signed)
Progress  Notes by Mamie Laurel, NP at 11/28/20 1021                Author: Mamie Laurel, NP  Service: Internal Medicine  Author Type: Nurse Practitioner       Filed: 11/28/20 1027  Date of Service: 11/28/20 1021  Status: Signed          Editor: Mamie Laurel, NP (Nurse Practitioner)                        Hospitalist Progress Note          Mamie Laurel, APRN, FNP-C      Daily Progress Note: 11/28/2020         Subjective:    Subjective    Patient examined alert and oriented lying in bed.  She reports ports headache secondary to recent ground-level fall.  Also requesting to have diet change due to inability to chew food, left dentures at home.      Review of Systems:    Review of Systems    Constitutional:  Negative for chills and fever.    Respiratory:  Negative for cough and shortness of breath.     Cardiovascular:  Negative for chest pain.    Gastrointestinal:  Negative for abdominal pain, nausea and vomiting.    Musculoskeletal:  Positive for falls and joint pain .    Neurological:  Positive for weakness and headaches .          Objective:    Objective        Vitals:   Patient Vitals for the past 12 hrs:            Temp  Pulse  Resp  BP  SpO2            11/28/20 1007  97.9 ??F (36.6 ??C)  (!) 106  16  102/70  97 %            11/28/20 0400  --  83  --  --  --     11/28/20 0324  98.6 ??F (37 ??C)  86  20  123/77  97 %     11/28/20 0000  --  85  --  --  --            11/27/20 2330  98.2 ??F (36.8 ??C)  --  20  102/66  97 %                  Physical Exam   Vitals and nursing note reviewed.    Cardiovascular:       Rate and Rhythm: Tachycardia present.    Pulmonary:       Breath sounds: Normal breath sounds.     Abdominal:       General: Bowel sounds are normal.    Skin:      Findings: Bruising present.       Comments: Bilateral eye bruising, left frontal hematoma bruising, bilateral arm bruising    Neurological:       Mental Status: She is alert and oriented to person, place, and time.           Lab Results:      Recent Results (from the past 24 hour(s))     PROTHROMBIN TIME + INR          Collection Time: 11/27/20 12:39 PM         Result  Value  Ref Range  Prothrombin time  12.7  11.9 - 14.6 sec       INR  0.9  0.9 - 1.1         MAGNESIUM          Collection Time: 11/27/20 12:39 PM         Result  Value  Ref Range            Magnesium  2.2  1.6 - 2.4 mg/dL           Results              ** No results found for the last 336 hours. **                      Diagnostic Images:     CT Results  (Last 48 hours)                                       11/27/20 0307    CT HEAD WO CONT  Final result            Impression:    No acute intracranial hemorrhage. Large left frontal scalp hematoma.                                     Narrative:    EXAM: CT HEAD WO CONT             INDICATION: s/p fall, patient on anticoagulants, r/o ICH             COMPARISON: None.             CONTRAST: None.             TECHNIQUE: Unenhanced CT of the head was performed using 5 mm images. Brain and      bone windows were generated. Coronal and sagittal reformats. CT dose reduction      was achieved through use of a standardized protocol tailored for this      examination and automatic exposure control for dose modulation.               FINDINGS:      The ventricles and sulci are normal in size, shape and configuration.. There is      no significant white matter disease. There is no intracranial hemorrhage,      extra-axial collection, or mass effect. The basilar cisterns are open. No CT      evidence of acute infarct.             The bone windows demonstrate no abnormalities. The visualized portions of the      paranasal sinuses and mastoid air cells are clear. Large left frontal scalp      hematoma.                                          CT HEAD WO CONT       Final Result     No acute intracranial hemorrhage. Large left frontal scalp hematoma.                      XR CHEST PORT       Final  Result          No acute cardiopulmonary process.  Questionable spiculated opacity in the left     upper lobe. Recommend follow-up imaging.                 XR HIP LT W OR WO PELV 2-3 VWS       Final Result     No acute abnormality.                           Current Medications:      Current Facility-Administered Medications:    ?  acetaminophen (TYLENOL) tablet 650 mg, 650 mg, Oral, Q6H **OR** acetaminophen (TYLENOL) suppository 650 mg, 650 mg, Rectal, Q6H, Travion Ke,  Amar Sippel, NP   ?  albuterol (PROVENTIL HFA, VENTOLIN HFA, PROAIR HFA) inhaler 2 Puff, 2 Puff, Inhalation, Q4H PRN, Cha, Raquel James, MD   ?  methocarbamoL (ROBAXIN) tablet 500 mg, 500 mg, Oral, QHS PRN, Cha, Raquel James, MD   ?  pantoprazole (PROTONIX) tablet 40 mg, 40 mg, Oral, ACB, Cha, Raquel James, MD, 40 mg at 11/28/20 0820   ?  atorvastatin (LIPITOR) tablet 20 mg, 20 mg, Oral, DAILY, Cha, Raquel James, MD, 20 mg at 11/28/20 0820   ?  budesonide-formoteroL (SYMBICORT) 160-4.5 mcg/actuation HFA inhaler 1 Puff, 1 Puff, Inhalation, BID RT, 1 Puff at 11/27/20 1923 **AND**  tiotropium bromide (SPIRIVA RESPIMAT) 2.5 mcg /actuation, 2 Puff, Inhalation, DAILY, Cha, Raquel James, MD, 2 Puff at 11/27/20 0800   ?  sodium chloride (NS) flush 5-40 mL, 5-40 mL, IntraVENous, Q8H, Cha, Ming Han, MD, 10 mL at 11/28/20 0519   ?  sodium chloride (NS) flush 5-40 mL, 5-40 mL, IntraVENous, PRN, Cha, Raquel James, MD   ?  polyethylene glycol (MIRALAX) packet 17 g, 17 g, Oral, DAILY PRN, Cha, Raquel James, MD   ?  ondansetron (ZOFRAN ODT) tablet 4 mg, 4 mg, Oral, Q8H PRN **OR** ondansetron (ZOFRAN) injection 4 mg, 4 mg, IntraVENous, Q6H PRN, Cha,  Raquel James, MD   ?  potassium chloride (K-DUR, KLOR-CON M20) SR tablet 40 mEq, 40 mEq, Oral, DAILY, Cha, Raquel James, MD, 40 mEq at 11/28/20 0820   ?  oxyCODONE IR (ROXICODONE) tablet 5 mg, 5 mg, Oral, Q4H PRN, Mamie Laurel, NP, 5 mg at 11/28/20 1020   ?  melatonin tablet 3 mg, 3 mg, Oral, QHS, Tegegn, Nahom, MD, 3 mg at 11/27/20 2355          ASSESSMENT:         [name] is a 75 y.o. female with PMH of  COPD, HLP, GERD and insomnia. Patient presented to the ED with chief complaint of recurrent falls. She reports has been having dizziness, worsening  by standing up. Also associated with mild palpitation, generalized weakness and fall. She typically doesn't loss consciousness however did loss consciousness last Sunday and hit her head. Did not recall the details of fall. She denies history of heart  disease including atrial fibrillation or seizure. Does report to have been eating and drinking poorly since husband passed recently.        In the ED, vital signs stable. Mild hypokalemia and leukocytosis. CT head showed no acute intracranial hemorrhage. Obtain therapy eval. Admit to observation services.             # Orthostatic hypotension   - PT/OT eval   - orthostatic vital signs       # Syncope   -  suspect secondary to above. Low suspicious of cardiac syncope based on history   - monitor on telemetry.        # Ambulatory dysfunction   - pt/ot eval   - CM for dispo planning      # Hypokalemia   - repletion ordered   - monitor        # COPD   - not in exacerbation   - Continue home medications.        # HLP   - Continue home medications.          Full Code   Dvt Prophylaxis none   GI Prophylaxis protonix            Above treatment plan reviewed and discussed with patient in detail at bedside, all questions answered.      Care Plan discussed with: Patient/Family      Total time spent with patient: 30 minutes.      Mamie Laurel, NP

## 2020-11-28 NOTE — Progress Notes (Signed)
Medicare Outpatient Observation Notice (MOON)/ [place] Outpatient Observation Notice (VOON) provided to patient/representative with verbal explanation of the notice. Time allotted for questions regarding the notice.  Patient /representative provided a completed copy of the MOON/VOON notice.  Copy filed in Med Rec Dept.

## 2020-11-29 LAB — CBC WITH AUTOMATED DIFF
ABS. BASOPHILS: 0.1 10*3/uL (ref 0.0–0.1)
ABS. EOSINOPHILS: 0.4 10*3/uL (ref 0.0–0.4)
ABS. IMM. GRANS.: 0 10*3/uL (ref 0.00–0.04)
ABS. LYMPHOCYTES: 3.3 10*3/uL (ref 0.8–3.5)
ABS. MONOCYTES: 0.6 10*3/uL (ref 0.0–1.0)
ABS. NEUTROPHILS: 5.4 10*3/uL (ref 1.8–8.0)
ABSOLUTE NRBC: 0 10*3/uL (ref 0.00–0.01)
BASOPHILS: 1 % (ref 0–1)
EOSINOPHILS: 4 % (ref 0–7)
HCT: 35.8 % (ref 35.0–47.0)
HGB: 11.3 g/dL — ABNORMAL LOW (ref 11.5–16.0)
IMMATURE GRANULOCYTES: 0 % (ref 0–0.5)
LYMPHOCYTES: 33 % (ref 12–49)
MCH: 31.3 PG (ref 26.0–34.0)
MCHC: 31.6 g/dL (ref 30.0–36.5)
MCV: 99.2 FL — ABNORMAL HIGH (ref 80.0–99.0)
MONOCYTES: 7 % (ref 5–13)
MPV: 8.5 FL — ABNORMAL LOW (ref 8.9–12.9)
NEUTROPHILS: 55 % (ref 32–75)
NRBC: 0 PER 100 WBC
PLATELET: 341 10*3/uL (ref 150–400)
RBC: 3.61 M/uL — ABNORMAL LOW (ref 3.80–5.20)
RDW: 13.8 % (ref 11.5–14.5)
WBC: 9.8 10*3/uL (ref 3.6–11.0)

## 2020-11-29 LAB — METABOLIC PANEL, BASIC
Anion gap: 4 mmol/L — ABNORMAL LOW (ref 5–15)
BUN/Creatinine ratio: 36 — ABNORMAL HIGH (ref 12–20)
BUN: 26 mg/dL — ABNORMAL HIGH (ref 6–20)
CO2: 26 mmol/L (ref 21–32)
Calcium: 9.2 mg/dL (ref 8.5–10.1)
Chloride: 109 mmol/L — ABNORMAL HIGH (ref 97–108)
Creatinine: 0.72 mg/dL (ref 0.55–1.02)
Glucose: 105 mg/dL — ABNORMAL HIGH (ref 65–100)
Potassium: 4.8 mmol/L (ref 3.5–5.1)
Sodium: 139 mmol/L (ref 136–145)
eGFR: >60 mL/min/{1.73_m2} (ref >60)

## 2020-11-29 LAB — BASIC METABOLIC PANEL
Anion Gap: 4 mmol/L — ABNORMAL LOW (ref 5–15)
BUN: 26 mg/dL — ABNORMAL HIGH (ref 6–20)
Bun/Cre Ratio: 36 — ABNORMAL HIGH (ref 12–20)
CO2: 26 mmol/L (ref 21–32)
Calcium: 9.2 mg/dL (ref 8.5–10.1)
Chloride: 109 mmol/L — ABNORMAL HIGH (ref 97–108)
Creatinine: 0.72 mg/dL (ref 0.55–1.02)
ESTIMATED GLOMERULAR FILTRATION RATE: >60 mL/min/{1.73_m2} (ref >60)
Glucose: 105 mg/dL — ABNORMAL HIGH (ref 65–100)
Potassium: 4.8 mmol/L (ref 3.5–5.1)
Sodium: 139 mmol/L (ref 136–145)

## 2020-11-29 LAB — CBC WITH AUTO DIFFERENTIAL
Basophils %: 1 % (ref 0–1)
Basophils Absolute: 0.1 10*3/uL (ref 0.0–0.1)
Eosinophils %: 4 % (ref 0–7)
Eosinophils Absolute: 0.4 10*3/uL (ref 0.0–0.4)
Granulocyte Absolute Count: 0 10*3/uL (ref 0.00–0.04)
Hematocrit: 35.8 % (ref 35.0–47.0)
Hemoglobin: 11.3 g/dL — ABNORMAL LOW (ref 11.5–16.0)
Immature Granulocytes: 0 % (ref 0–0.5)
Lymphocytes %: 33 % (ref 12–49)
Lymphocytes Absolute: 3.3 10*3/uL (ref 0.8–3.5)
MCH: 31.3 PG (ref 26.0–34.0)
MCHC: 31.6 g/dL (ref 30.0–36.5)
MCV: 99.2 FL — ABNORMAL HIGH (ref 80.0–99.0)
MPV: 8.5 FL — ABNORMAL LOW (ref 8.9–12.9)
Monocytes %: 7 % (ref 5–13)
Monocytes Absolute: 0.6 10*3/uL (ref 0.0–1.0)
NRBC Absolute: 0 10*3/uL (ref 0.00–0.01)
Neutrophils %: 55 % (ref 32–75)
Neutrophils Absolute: 5.4 10*3/uL (ref 1.8–8.0)
Nucleated RBCs: 0 PER 100 WBC
Platelets: 341 10*3/uL (ref 150–400)
RBC: 3.61 M/uL — ABNORMAL LOW (ref 3.80–5.20)
RDW: 13.8 % (ref 11.5–14.5)
WBC: 9.8 10*3/uL (ref 3.6–11.0)

## 2020-11-29 MED ORDER — SODIUM CHLORIDE 0.9% BOLUS IV
0.9 % | Freq: Once | INTRAVENOUS | Status: AC
Start: 2020-11-29 — End: 2020-11-29
  Administered 2020-11-29: 15:00:00 via INTRAVENOUS

## 2020-11-29 MED FILL — SPIRIVA RESPIMAT 2.5 MCG/ACTUATION SOLUTION FOR INHALATION: 2.5 mcg/actuation | RESPIRATORY_TRACT | Qty: 8

## 2020-11-29 MED FILL — PANTOPRAZOLE 40 MG TAB, DELAYED RELEASE: 40 mg | ORAL | Qty: 1

## 2020-11-29 MED FILL — SYMBICORT 160 MCG-4.5 MCG/ACTUATION HFA AEROSOL INHALER: RESPIRATORY_TRACT | Qty: 6

## 2020-11-29 MED FILL — TYLENOL 325 MG TABLET: 325 mg | ORAL | Qty: 2

## 2020-11-29 MED FILL — POTASSIUM CHLORIDE SR 20 MEQ TAB, PARTICLES/CRYSTALS: 20 mEq | ORAL | Qty: 2

## 2020-11-29 MED FILL — ATORVASTATIN 20 MG TAB: 20 mg | ORAL | Qty: 1

## 2020-11-29 MED FILL — SODIUM CHLORIDE 0.9 % IV: INTRAVENOUS | Qty: 1000

## 2020-11-29 MED FILL — MELATONIN 3 MG TAB: 3 mg | ORAL | Qty: 1

## 2020-11-29 NOTE — Discharge Summary (Signed)
Physician Discharge Summary     Patient ID:    [name]  [id number]  75 y.o.  July 11, 1945    Admit date: 11/27/2020    Discharge date : 11/29/2020    Chronic Diagnoses:    Problem List as of 11/29/2020 Never Reviewed            Codes Class Noted - Resolved    Cardiac syncope ICD-10-CM: R55  ICD-9-CM: 780.2  11/27/2020 - Present        * (Principal) Ambulatory dysfunction ICD-10-CM: R26.2  ICD-9-CM: 719.7  11/27/2020 - Present       22    Final Diagnoses:   Cardiac syncope [R55]    Reason for Hospitalization:    Orthostatic hypotension  Syncope likely s.t above  Hypokalemia  COPD    Hospital Course:   75 y.o. female with PMH of COPD, HLP, GERD and insomnia. Patient presented to the ED with chief complaint of recurrent falls. She reports has been having dizziness, worsening by standing up. Also associated with mild palpitation, generalized weakness and fall. She typically doesn't loss consciousness however did loss consciousness last Sunday and hit her head. Did not recall the details of fall. She denies history of heart disease including atrial fibrillation or seizure. Does report to have been eating and drinking poorly since husband passed recently.      In the ED, vital signs stable. Mild hypokalemia and leukocytosis. CT head showed no acute intracranial hemorrhage. Obtain therapy eval. Admit to observation services. Her urine is still dark, will repeat IV bolus, and repeat orthostatic vitals. EKG NSR and troponin negative.        INSTRUCTIONS ON DISCHARGE    (1) no medication changes were made. Please follow-up with PCP in 1 week      Discharge Medications:   Current Discharge Medication List        CONTINUE these medications which have NOT CHANGED    Details   fluticasone-umeclidin-vilanter (TRELEGY ELLIPTA) 200-62.5-25 mcg inhaler Trelegy Ellipta 200 mcg-62.5 mcg-25 mcg powder for inhalation   INHALE 1 PUFF BY MOUTH ONCE A DAY      albuterol (PROVENTIL HFA, VENTOLIN HFA, PROAIR HFA) 90  mcg/actuation inhaler albuterol sulfate HFA 90 mcg/actuation aerosol inhaler   INHALE 2 PUFFS EVERY 6 HOURS AS NEEDED      methocarbamoL (ROBAXIN) 500 mg tablet Take 500 mg by mouth nightly as needed.      omeprazole (PRILOSEC) 20 mg capsule Take 20 mg by mouth daily.      rosuvastatin (CRESTOR) 10 mg tablet Take 10 mg by mouth daily.               Follow up Care:    1. Cioflec, Doran Durand, MD in 1-2 weeks.  Please call to set up an appointment shortly after discharge.      Diet:  Regular Diet    Disposition:  Likely rehab    Advanced Directive:   FULL x   DNR      Discharge Exam:  Physical Exam  Vitals and nursing note reviewed.   Cardiovascular:      Rate and Rhythm: Tachycardia present.   Pulmonary:      Breath sounds: Normal breath sounds.   Abdominal:      General: Bowel sounds are normal.   Skin:     Findings: Bruising present.      Comments: Bilateral eye bruising, left frontal hematoma bruising, bilateral arm bruising   Neurological:  Mental Status: She is alert and oriented to person, place, and time.     CONSULTATIONS: None    Significant Diagnostic Studies:     Radiologic Studies -   Results from Hospital Encounter encounter on 11/27/20    XR HIP LT W OR WO PELV 2-3 VWS    Narrative  EXAM: XR HIP LT W OR WO PELV 2-3 VWS    INDICATION: sp fall, L hip pain.    COMPARISON: None.    FINDINGS: AP view of the pelvis and a frogleg lateral view of the left hip  demonstrate no fracture, dislocation or other acute abnormality.    Impression  No acute abnormality.     CT Results  (Last 48 hours)      None                Discharge time spent 35 minutes    Signed:  Alfonzo Beers, MD  11/29/2020  8:39 AM

## 2020-11-29 NOTE — Progress Notes (Signed)
11/29/20 1117   Peripheral IV 11/29/20 Right;Upper Cephalic   Placement Date/Time: 11/29/20 1117   Number of Attempts: 1  Inserted By: Louann Sjogren, RN.  Size: 20 G  Orientation: Right;Upper  Location: Cephalic   Site Assessment Clean, dry, & intact   Phlebitis Assessment 0   Infiltration Assessment 0   Dressing Status Clean, dry, & intact   Dressing Type Tape;Transparent   Hub Color/Line Status Blue   Action Taken Open ports on tubing capped   Alcohol Cap Used Yes     Ultrasound guided PIV insertion by vascular access team.

## 2020-11-29 NOTE — Progress Notes (Signed)
CM met with patient and her friend, Ms. Normand Sloop, in room to discuss DCP.  At this time it appears patient will need SNF, awaiting PT/OT recc, patient and friend agreeable, SNF choice list provided, patient and Ms. [name] requested [facility] CM asked for second/third choice for backup, Ms. Normand Sloop reported that she did not want patient to go to another SNF and she was not familiar with any others, CM asked that they keep SNF choice list and continue to review in the event that patient is not accepted at [facility], they verbalized understanding.    Patient will need insurance auth prior to d/c, CM explained this to patient and Ms. [name], CM awaiting PT/OT eval notes in order to send to facility.    CM continues to follow and monitor for needs.

## 2020-11-29 NOTE — Progress Notes (Signed)
 PHYSICAL THERAPY EVALUATION  Patient: [name] (75 y.o. female)  Date: 11/29/2020  Primary Diagnosis: Cardiac syncope [R55]       Precautions: fall      ASSESSMENT  Pt is a 75 y.o. female admitted on 11/27/2020 for fall; pt currently being treated for syncope . Pt semi supine  upon PT arrival, agreeable to evaluation. Pt A&O x 4.      Based on the objective data described, the patient presents with generalized weakness, impaired functional mobility, impaired amb, impaired balance, and decreased endurance to activities. (See below for objective details and assist levels).Patient able to amb to the bathroom and reported feeling dizzy.Assisted return to bed and BP taken.Not orthostatic. Overall pt tolerated session good today with PT. Pt will benefit from continued skilled PT to address above deficits and return to PLOF. Current PT DC recommendation Skilled Nursing Facility once medically appropriate.    Current Level of Function Impacting Discharge (mobility/balance): decreased gen strength    Other factors to consider for discharge: SNF      PLAN :  Recommendations and Planned Interventions: bed mobility training, transfer training, gait training, therapeutic exercises, patient and family training/education, and therapeutic activities      Recommend for staff: Out of bed to chair for meals, Encourage HEP in prep for ADLs/mobility, and Use of bed/chair alarm for safety    Frequency/Duration: Patient will be followed by physical therapy:  2-3x/week to address goals.    Recommendation for discharge: (in order for the patient to meet his/her long term goals)  Skilled Nursing Facility    This discharge recommendation:  Has been made in collaboration with the attending provider and/or case management    IF patient discharges home will need the following DME: rolling walker         SUBJECTIVE:   Patient stated "I am ok."    OBJECTIVE DATA SUMMARY:   HISTORY:    Past Medical History:   Diagnosis Date    Chronic  obstructive pulmonary disease (HCC)     Hypercholesteremia     Insomnia     Sleep disorder    History reviewed. No pertinent surgical history.    Home Situation  Home Environment: Private residence  # Steps to Enter: 8  Rails to Enter: Yes  Hand Rails : Bilateral  One/Two Story Residence: One story  Living Alone: Yes  Support Systems: Friend/Neighbor  Patient Expects to be Discharged to:: Skilled nursing facility  Current DME Used/Available at Home: [place], straight, Environmental consultant    EXAMINATION/PRESENTATION/DECISION MAKING:   Critical Behavior:  Neurologic State: Alert  Orientation Level: Oriented X4  Cognition: Follows commands     Hearing:     Skin:  bruise on face from fall  Edema: facial  Range Of Motion:  AROM: Generally decreased, functional                       Strength:    Strength: Generally decreased, functional                    Tone & Sensation:   Tone: Normal                                    Functional Mobility:  Bed Mobility:  Rolling: Stand-by assistance  Supine to Sit: Stand-by assistance  Sit to Supine: Stand-by assistance  Scooting: Stand-by assistance  Transfers:  Sit  to Stand: Contact guard assistance  Stand to Sit: Contact guard assistance                       Balance:   Sitting: Intact  Standing: Intact;With support  Ambulation/Gait Training:  Distance (ft): 22 Feet (ft)  Assistive Device: Gait belt;Walker, rolling  Ambulation - Level of Assistance: Contact guard assistance     Gait Description (WDL): Exceptions to WDL                                                   Functional Measure:  [employer] AM-PACT "6 Clicks"         Basic Mobility Inpatient Short Form  How much difficulty does the patient currently have... Unable A Lot A Little None   1.  Turning over in bed (including adjusting bedclothes, sheets and blankets)?   []  1   []  2   [x]  3   []  4   2.  Sitting down on and standing up from a chair with arms ( e.g., wheelchair, bedside commode, etc.)   []  1   []  2   [x]  3   []  4   3.   Moving from lying on back to sitting on the side of the bed?   []  1   []  2   [x]  3   []  4          How much help from another person does the patient currently need... Total A Lot A Little None   4.  Moving to and from a bed to a chair (including a wheelchair)?   []  1   []  2   [x]  3   []  4   5.  Need to walk in hospital room?   []  1   []  2   [x]  3   []  4   6.  Climbing 3-5 steps with a railing?   []  1   []  2   [x]  3   []  4    2007, Trustees of [address], under license to [place], [place]. All rights reserved     Score:  Initial: 18/24 Most Recent: X (Date: 11/29/2020 )   Interpretation of Tool:  Represents activities that are increasingly more difficult (i.e. Bed mobility, Transfers, Gait).  Score 24 23 22-20 19-15 14-10 9-7 6   Modifier CH CI CJ CK CL CM CN         Physical Therapy Evaluation Charge Determination   History Examination Presentation Decision-Making   LOW Complexity : Zero comorbidities / personal factors that will impact the outcome / POC LOW Complexity : 1-2 Standardized tests and measures addressing body structure, function, activity limitation and / or participation in recreation  LOW Complexity : Stable, uncomplicated  Other outcome measures ampac 6  18/24      Based on the above components, the patient evaluation is determined to be of the following complexity level: LOW     Pain Rating:  3/10 face    Activity Tolerance:   Good    After treatment patient left in no apparent distress:   Bed locked and in lowest position Supine in bed, Call bell within reach, and Bed / chair alarm activated .    GOALS:    Problem: Mobility Impaired (Adult and Pediatric)  Goal: *Acute  Goals and Plan of Care (Insert Text)  Description: Patient stated goal: Get ebtter  Patient will move from supine to sit and sit to supine , scoot up and down, and roll side to side in bed with modified independence within 7 day(s).    Patient will transfer from bed to chair and chair to bed with modified independence using the  least restrictive device within 7 day(s).    Patient will improve static standing balance to modified independence within 1 week(s).  Patient will ambulate 60 feet with modified independence with least restrictive device within 1 weeks.     Outcome: Progressing Towards Goal       COMMUNICATION/EDUCATION:   The patient's plan of care was discussed with: Occupational therapist, Registered nurse, and Case management.     Fall prevention education was provided and the patient/caregiver indicated understanding. and Patient/family have participated as able in goal setting and plan of care.        Thank you for this referral.  Wilfredo JONELLE Blanch, PT   Time Calculation: 20 mins

## 2020-11-30 ENCOUNTER — Observation Stay: Admit: 2020-11-30 | Payer: MEDICARE | Primary: Internal Medicine

## 2020-11-30 MED ORDER — OXYCODONE 5 MG TAB
5 mg | Freq: Four times a day (QID) | ORAL | Status: DC | PRN
Start: 2020-11-30 — End: 2020-12-01
  Administered 2020-12-01: 18:00:00 via ORAL

## 2020-11-30 MED ORDER — ACETAMINOPHEN 325 MG TABLET
325 mg | ORAL | Status: DC | PRN
Start: 2020-11-30 — End: 2020-12-01

## 2020-11-30 MED ORDER — ACETAMINOPHEN 650 MG RECTAL SUPPOSITORY
650 mg | Freq: Four times a day (QID) | RECTAL | Status: DC | PRN
Start: 2020-11-30 — End: 2020-12-01

## 2020-11-30 MED FILL — SYMBICORT 160 MCG-4.5 MCG/ACTUATION HFA AEROSOL INHALER: RESPIRATORY_TRACT | Qty: 6

## 2020-11-30 MED FILL — MELATONIN 3 MG TAB: 3 mg | ORAL | Qty: 1

## 2020-11-30 MED FILL — SPIRIVA RESPIMAT 2.5 MCG/ACTUATION SOLUTION FOR INHALATION: 2.5 mcg/actuation | RESPIRATORY_TRACT | Qty: 4

## 2020-11-30 MED FILL — POTASSIUM CHLORIDE SR 20 MEQ TAB, PARTICLES/CRYSTALS: 20 mEq | ORAL | Qty: 2

## 2020-11-30 MED FILL — PANTOPRAZOLE 40 MG TAB, DELAYED RELEASE: 40 mg | ORAL | Qty: 1

## 2020-11-30 MED FILL — TYLENOL 325 MG TABLET: 325 mg | ORAL | Qty: 2

## 2020-11-30 MED FILL — ATORVASTATIN 20 MG TAB: 20 mg | ORAL | Qty: 1

## 2020-11-30 NOTE — Progress Notes (Signed)
Pt do not have personal CPAP/BIPAP device in the room. Pt also stated that she does not use any kind of Device at night

## 2020-11-30 NOTE — Progress Notes (Signed)
Patient accepted to go to [facility], but will require auth. CM awaiting OT note for SNF to start auth. CM will make therapy team aware in AM IDR meeting at [place].    --------------    1135- Updated therapy notes uploaded in [place].    Patients [name] has been approved to go to [facility] today, room 217A and nurse can call report to [phone].    CM met with patient at bedside to notify. Patient asks that CM call her friend Normand Sloop to provide transport to SNF.    CM called [name] @ [phone] and a man answered that stated that [name] was at a doctor's appointment and will be at the hospital in the next hour or so to visit patient.    CM notified patient of the above. CM will follow-up after lunch to confirm patients transport with her friend [name].    -----------------    1200- CM spoke with Tia Alert, NP and DC order has been discontinued. Cardiology is being consulted for a work-up. CM notified [place] via [place] and gave update to patient.

## 2020-11-30 NOTE — Progress Notes (Signed)
OCCUPATIONAL THERAPY EVALUATION  Patient: [name] (75 y.o. female)  Date: 11/30/2020  Primary Diagnosis: Cardiac syncope [R55]       Precautions: fall risk       ASSESSMENT  Pt is a 75 y.o. female presenting to [facility] with c/o recurrent falls with associated dizziness, worsening with standing, admitted 11/27/20 and currently being treated for fall, cardiac syncope. CT head (-) for acute intracranial hemorrhage. Pt received sitting up in recliner chair upon arrival, AXO x4, and agreeable to OT evaluation.     Based on current observations, pt presents with deficits in generalized strength/AROM, static/dynamic standing balance (see PT note for gait details), and functional activity tolerance currently impacting overall performance of ADLs and functional transfers/mobility (see below for objective details and assist levels). Overall, pt tolerates session fair with no c/o pain, however dizziness reported with OOB mobility; able to ambulate bed>chair using gt belt and RW for toileting routine and mod A for hygiene s/t required UE support. Pt left resting comfortably in chair with call bell/needs in reach at end of session. RN updated. Pt would benefit from continued skilled OT services to address current impairments and improve IND and safety with self cares and functional transfers/mobility. Current OT d/c recommendation Skilled Nursing Facility once medically appropriate.    Other factors to consider for discharge: family/social support, DME, time since onset, severity of deficits, functional baseline     Patient will benefit from skilled therapy intervention to address the above noted impairments.       PLAN :  Recommendations and Planned Interventions: self care training, functional mobility training, therapeutic exercise, balance training, therapeutic activities, endurance activities, neuromuscular re-education, patient education, and family training/education    Recommend with staff: Out of bed to chair for meals  and Encourage HEP in prep for ADLs/mobility    Recommend next session: UB dressing    Frequency/Duration: Patient will be followed by occupational therapy:  3-5x/week to address goals.    Recommendation for discharge: (in order for the patient to meet his/her long term goals)  Skilled Nursing Facility    This discharge recommendation:  Has been made in collaboration with the attending provider and/or case management       SUBJECTIVE:   Patient stated "I get dizzy when I move."    OBJECTIVE DATA SUMMARY:   HISTORY:   Past Medical History:   Diagnosis Date    Chronic obstructive pulmonary disease (HCC)     Hypercholesteremia     Insomnia     Sleep disorder      History reviewed. No pertinent surgical history.    Per pt report:   Home Situation  Home Environment: Private residence  # Steps to Enter: 7  Rails to Enter: Yes  Secondary school teacher : Bilateral  One/Two Story Residence: Two story, live on 1st floor  Living Alone: Yes  Support Systems: Friend/Neighbor  Patient Expects to be Discharged to:: Skilled nursing facility  Current DME Used/Available at Home: Environmental consultant, rolling, Medical laboratory scientific officer, straight    Hand dominance: Right    EXAMINATION OF PERFORMANCE DEFICITS:  Cognitive/Behavioral Status:  Neurologic State: Alert  Orientation Level: Oriented X4  Cognition: Follows commands;Appropriate decision making               Hearing:  Auditory  Auditory Impairment: None      Range of Motion:  AROM: Within functional limits  Strength:  Strength: Generally decreased, functional                Coordination:  Coordination: Within functional limits  Fine Motor Skills-Upper: Left Intact;Right Intact    Gross Motor Skills-Upper: Left Intact;Right Intact    Tone & Sensation:  Tone: Normal                         Balance:  Sitting: Intact  Standing: Impaired;With support  Standing - Static: Fair;Constant support  Standing - Dynamic : Fair;Constant support    Functional Mobility and Transfers for ADLs:  Bed Mobility:  Rolling:  Stand-by assistance  Supine to Sit: Stand-by assistance  Scooting: Stand-by assistance    Transfers:  Sit to Stand: Contact guard assistance  Stand to Sit: Contact guard assistance  Bed to Chair: Contact guard assistance  Bathroom Mobility: Contact guard assistance  Toilet Transfer : Contact guard assistance      ADL Intervention and task modifications:       Grooming  Grooming Assistance: Set-up;Stand-by assistance  Position Performed: Seated in chair  Washing Hands: Set-up;Stand-by assistance                   Lower Body Dressing Assistance  Slip on Shoes with Back: Set-up;Stand-by assistance  Position Performed: Bending forward method;Seated in chair    Toileting  Toileting Assistance: Moderate assistance  Bladder Hygiene: Contact guard assistance  Bowel Hygiene: Total assistance (dependent)         Therapeutic Exercise:  Pt would benefit from UE HEP to improve overall UE AROM/strength and can be further educated in next treatment session.      Functional Measure:    [employer] AM-PACTM "6 Clicks"                                                       Daily Activity Inpatient Short Form  How much help from another person does the patient currently need... Total; A Lot A Little None   1.  Putting on and taking off regular lower body clothing? []   1 []   2 [x]   3 []   4   2.  Bathing (including washing, rinsing, drying)? []   1 []   2 [x]   3 []   4   3.  Toileting, which includes using toilet, bedpan or urinal? []  1 []   2 [x]   3 []   4   4.  Putting on and taking off regular upper body clothing? []   1 []   2 [x]   3 []   4   5.  Taking care of personal grooming such as brushing teeth? []   1 []   2 [x]   3 []   4   6.  Eating meals? []   1 []   2 []   3 [x]   4    2007, Trustees of [address], under license to [employer] [place]. All rights reserved     Score: 19/24     Interpretation of Tool:  Represents clinically-significant functional categories (i.e. Activities of daily living).  Percentage of Impairment CH    0%    CI    1-19% CJ    20-39% CK    40-59% CL    60-79% CM    80-99% CN     100%   AMPAC  Score 6-24 24 23  20-22 15-19 10-14 7-9 6     Occupational Therapy Evaluation Charge Determination   History Examination Decision-Making   LOW Complexity : Brief history review  LOW Complexity : 1-3 performance deficits relating to physical, cognitive , or psychosocial skils that result in activity limitations and / or participation restrictions  MEDIUM Complexity : Patient may present with comorbidities that affect occupational performnce. Miniml to moderate modification of tasks or assistance (eg, physical or verbal ) with assesment(s) is necessary to enable patient to complete evaluation       Based on the above components, the patient evaluation is determined to be of the following complexity level: LOW   Pain Rating:  0/10    Activity Tolerance:   Fair    After treatment patient left in no apparent distress:    Sitting in chair, Heels elevated for pressure relief, and Call bell within reach, bed locked and in lowest position    COMMUNICATION/EDUCATION:   The patient's plan of care was discussed with: Registered nurse and Case management.     Patient/family have participated as able in goal setting and plan of care. and Patient/family agree to work toward stated goals and plan of care.    This patient's plan of care is appropriate for delegation to OTA.      Thank you for this referral.  Jone Baseman  Time Calculation: 33 mins   Problem: Self Care Deficits Care Plan (Adult)  Goal: *Acute Goals and Plan of Care (Insert Text)  Description: FUNCTIONAL STATUS PRIOR TO ADMISSION: Patient was modified independent using a rolling walker and single point cane for functional mobility. Patient was modified independent for basic and instrumental ADLs.     HOME SUPPORT: The patient lived with alone and required assist from friend nearby for transportation at [facility].    Occupational Therapy Goals  Initiated 11/30/2020    Pt stated goal "to get  better and go home"  Pt will be MI sup <> sit in prep for EOB ADLs  Pt will be MI grooming standing sink side LRAD  Pt will be MI UB dressing sitting EOB/long sit   Pt will be MI LE dressing sitting EOB/long sit  Pt will be MI sit <> stand in prep for toileting LRAD  Pt will be MI toileting/toilet transfer/cloth mgmt LRAD  Pt will be IND following UE HEP in prep for self care tasks      Outcome: Not Met

## 2020-11-30 NOTE — Progress Notes (Signed)
Hospitalist Progress Note    Subjective:   Daily Progress Note: 11/30/2020 4:14 PM    Hospital Course:  Pt admitted for recurrent falls, and syncope. CT head did not show acute intracranial hemorrhage, there is a  left frontal scalp hematoma. She states had past falls, moved down to [place] with friend after her husband passed away last year.   Subjective:    Current Facility-Administered Medications   Medication Dose Route Frequency    acetaminophen (TYLENOL) tablet 650 mg  650 mg Oral Q4H PRN    Or    acetaminophen (TYLENOL) suppository 650 mg  650 mg Rectal Q6H PRN    oxyCODONE IR (ROXICODONE) tablet 5 mg  5 mg Oral Q6H PRN    albuterol (PROVENTIL HFA, VENTOLIN HFA, PROAIR HFA) inhaler 2 Puff  2 Puff Inhalation Q4H PRN    pantoprazole (PROTONIX) tablet 40 mg  40 mg Oral ACB    atorvastatin (LIPITOR) tablet 20 mg  20 mg Oral DAILY    budesonide-formoteroL (SYMBICORT) 160-4.5 mcg/actuation HFA inhaler 1 Puff  1 Puff Inhalation BID RT    And    tiotropium bromide (SPIRIVA RESPIMAT) 2.5 mcg /actuation  2 Puff Inhalation DAILY    sodium chloride (NS) flush 5-40 mL  5-40 mL IntraVENous Q8H    sodium chloride (NS) flush 5-40 mL  5-40 mL IntraVENous PRN    polyethylene glycol (MIRALAX) packet 17 g  17 g Oral DAILY PRN    ondansetron (ZOFRAN ODT) tablet 4 mg  4 mg Oral Q8H PRN    Or    ondansetron (ZOFRAN) injection 4 mg  4 mg IntraVENous Q6H PRN    potassium chloride (K-DUR, KLOR-CON M20) SR tablet 40 mEq  40 mEq Oral DAILY    melatonin tablet 3 mg  3 mg Oral QHS        Review of Systems:    Review of Systems   Constitutional:  Negative for chills and fever.   Respiratory:  Negative for cough and shortness of breath.    Cardiovascular:  Negative for chest pain and orthopnea.   Gastrointestinal:  Negative for abdominal pain, heartburn, nausea and vomiting.   Genitourinary:  Negative for dysuria and urgency.   Neurological:  Negative for dizziness and headaches.      Objective:     Visit Vitals  BP 119/79 (BP 1 Location: Left  upper arm, BP Patient Position: At rest)   Pulse 86   Temp 97.4 ??F (36.3 ??C)   Resp 18   Ht 5' (1.524 m)   Wt 52.2 kg (115 lb)   SpO2 97%   BMI 22.46 kg/m??      O2 Device: None (Room air)    Temp (24hrs), Avg:97.7 ??F (36.5 ??C), Min:97.4 ??F (36.3 ??C), Max:98 ??F (36.7 ??C)      No intake/output data recorded.  11/06 1901 - 11/08 0700  In: 337 [P.O.:337]  Out: 650 [Urine:650]    PHYSICAL EXAM:    Physical Exam  Constitutional:       General: She is not in acute distress.  HENT:      Head: Normocephalic and atraumatic. Right periorbital erythema and left periorbital erythema present.      Comments: Left forehead hematoma , facial bruising  Cardiovascular:      Rate and Rhythm: Normal rate and regular rhythm.      Pulses: Normal pulses.      Heart sounds: Normal heart sounds.   Pulmonary:      Effort: Pulmonary  effort is normal.      Breath sounds: Normal breath sounds.   Abdominal:      General: Bowel sounds are normal.      Palpations: Abdomen is soft.   Musculoskeletal:         General: Normal range of motion.   Skin:     General: Skin is warm and dry.   Neurological:      Mental Status: She is oriented to person, place, and time.   Psychiatric:         Mood and Affect: Mood normal.         Behavior: Behavior normal.          Data Review    Recent Results (from the past 24 hour(s))   ECHO ADULT COMPLETE    Collection Time: 11/30/20  3:45 PM   Result Value Ref Range    LV EDV A2C 53 mL    LV EDV A4C 64 mL    LV ESV A2C 27 mL    LV ESV A4C 31 mL    IVSd 1.2 (A) 0.6 - 0.9 cm    LVIDd 3.5 (A) 3.9 - 5.3 cm    LVIDs 2.4 cm    LVOT Diameter 2.0 cm    LVOT Mean Gradient 1 mmHg    LVOT VTI 14.3 cm    LVOT Peak Velocity 0.8 m/s    LVOT Peak Gradient 3 mmHg    LVPWd 1.1 (A) 0.6 - 0.9 cm    LV E' Lateral Velocity 8 cm/s    LV E' Septal Velocity 4 cm/s    LV Ejection Fraction A2C 50 %    LV Ejection Fraction A4C 52 %    EF BP 52 (A) 55 - 100 %    LVOT Area 3.1 cm2    LVOT SV 44.9 ml    LA Minor Axis 4.4 cm    LA Major Axis 3.5 cm     LA Area 2C 12.3 cm2    LA Area 4C 8.3 cm2    LA Diameter 3.0 cm    RA Area 4C 10.7 cm2    RA Volume 24 ml    AV Mean Gradient 3 mmHg    AV VTI 16.5 cm    AV Mean Velocity 0.8 m/s    AV Peak Velocity 1.0 m/s    AV Peak Gradient 4 mmHg    AV Area by VTI 2.7 cm2    AV Area by Peak Velocity 2.7 cm2    Aortic Root 3.7 cm    MV E Wave Deceleration Time 107.0 ms    MV E Velocity 0.63 m/s    MV Mean Gradient 2 mmHg    MV VTI 17.9 cm    MV Mean Velocity 0.6 m/s    MV Max Velocity 1.2 m/s    MV Peak Gradient 5 mmHg    MV Area by VTI 2.5 cm2    RV Basal Dimension 5.0 cm    TAPSE 2.0 1.7 cm    Fractional Shortening 2D 31 28 - 44 %    LV ESV Index A4C 21 mL/m2    LV EDV Index A4C 43 mL/m2    LV ESV Index A2C 18 mL/m2    LV EDV Index A2C 36 mL/m2    LVIDd Index 2.36 cm/m2    LVIDs Index 1.62 cm/m2    LV RWT Ratio 0.63     LV Mass 2D 127.3 67 - 162 g  LV Mass 2D Index 86.0 43 - 95 g/m2    E/E' Ratio (Averaged) 11.81     E/E' Lateral 7.88     E/E' Septal 15.75     LVOT Stroke Volume Index 30.3 mL/m2    LA Size Index 2.03 cm/m2    LA/AO Root Ratio 0.81     RA Volume Index A4C 16 mL/m2    Ao Root Index 2.50 cm/m2    AV Velocity Ratio 0.80     LVOT:AV VTI Index 0.87     AVA/BSA VTI 1.8 cm2/m2    AVA/BSA Peak Velocity 1.8 cm2/m2    DJ:MEQA VTI Index 1.25        CT HEAD WO CONT   Final Result   No acute intracranial hemorrhage. Large left frontal scalp hematoma.            XR CHEST PORT   Final Result      No acute cardiopulmonary process. Questionable spiculated opacity in the left   upper lobe. Recommend follow-up imaging.         XR HIP LT W OR WO PELV 2-3 VWS   Final Result   No acute abnormality.      CT HEAD W WO CONT    (Results Pending)       Principal Problem:    Ambulatory dysfunction (11/27/2020)    Active Problems:    Cardiac syncope (11/27/2020)        Assessment/Plan:   Falls- recurrent, r/o cardiac etiology, order echo, results pending, orthostatic VS, EKG NSR w/prolonged QT interval, repeat EKG in am, monitor  telemetry, consult to cardiology, OT/PT    2. COPD- on inhalers,     3. CAD- statin.     4. Discharge- cancel until cardia evaluation is complete.         DVT Prophylaxis: sequential compression  Code Status:  Full Code  POA:  next contact Elle Trisvan [phone]    Care Plan discussed with:   ____patient, staff nurse, CM, cardiology___________________________________________________________    Estill Batten, NP

## 2020-11-30 NOTE — Consults (Signed)
Consult    NAME: [name]   DOB:  Apr 21, 1945   MRN:  [id number]     Date/Time:  11/30/2020 4:51 PM    Patient PCP: Diona Browner, MD  ________________________________________________________________________     Problem List:   -Syncope  -COPD  -Dyslipidemia  -Insomnia        Assessment/Plan:   -75 year old white female with no known established coronary artery disease admitted to the hospital with syncopal episode.  -When I saw the patient she was lying comfortably in the bed and apparently has significant bruises on her face and head.  -She was alert oriented cooperative pleasant and able to communicate with me appropriately and stated that she do not have any heart disease.  -Cardiac enzyme has been unremarkable EKG shows no acute changes but sinus rhythm and no ST-T wave changes consistent with significant ischemia or injury pattern or any pauses or prolonged intervals.  -Echocardiogram is pending.  -We will check echocardiogram and then make further cardiac management decision as clinically warranted.    For consultation and letting me participate in the care of your patient.      []         High complexity decision making was performed      Patient Active Problem List   Diagnosis Code    Cardiac syncope R55    Ambulatory dysfunction R26.2        Subjective:   CHIEF COMPLAINT:     HISTORY OF PRESENT ILLNESS:     [name] is a 75 y.o.  Caucasian female who no known established coronary artery disease and admitted to the hospital after she passed out at home. When I saw the patient she was lying comfortably in the bed awake alert oriented cooperative pleasant and able to communicate with me appropriately. She stated that she was at home and fell.  She denies any palpitation dizziness chest pain or shortness of breath or any other anginal equivalent prior to passing out. She has no known established coronary artery disease and do not recall any cardiovascular testing being done in the recent past. Her  coronary artery disease risk factors include age greater than 75 and dyslipidemia. We will check serial cardiac enzymes monitor her on telemetry and check echocardiogram and then make further cardiac management decision as clinically warranted.      We were asked to consult for work up and evaluation of the above problems.     Past Medical History:   Diagnosis Date    Chronic obstructive pulmonary disease (HCC)     Hypercholesteremia     Insomnia     Sleep disorder       History reviewed. No pertinent surgical history.  Allergies   Allergen Reactions    Cheese Nausea and Vomiting     Severe vomiting      Meds:  See below  Social History     Tobacco Use    Smoking status: Former     Packs/day: 1.00     Types: Cigarettes     Quit date: 2021     Years since quitting: 1.8     Passive exposure: Past    Smokeless tobacco: Never   Substance Use Topics    Alcohol use: Yes     Alcohol/week: 1.0 standard drink     Types: 1 Cans of beer per week     Comment: 1 can of beer every 1-2 weeks      History reviewed. No pertinent family history.  REVIEW OF SYSTEMS:     []          Unable to obtain  ROS due to ---   [x]          Total of 12 systems reviewed as follows:    Constitutional: negative fever, negative chills, negative weight loss  Eyes:   negative visual changes  ENT:   negative sore throat, tongue or lip swelling  Respiratory:  negative cough, negative dyspnea  Cards:  negative for chest pain, palpitations, lower extremity edema  GI:   negative for nausea, vomiting, diarrhea, and abdominal pain  Genitourinary: negative for frequency, dysuria  Integument:  negative for rash   Hematologic:  negative for easy bruising and gum/nose bleeding  Musculoskel: negative for myalgias,  back pain  Neurological:  negative for headaches, dizziness, vertigo, weakness  Behavl/Psych: negative for feelings of anxiety, depression     Pertinent Positives include :    Objective:      Physical Exam:    Last 24hrs VS reviewed since prior progress  note. Most recent are:    Visit Vitals  BP 119/79 (BP 1 Location: Left upper arm, BP Patient Position: At rest)   Pulse 86   Temp 97.4 ??F (36.3 ??C)   Resp 18   Ht 5' (1.524 m)   Wt 52.2 kg (115 lb)   SpO2 97%   BMI 22.46 kg/m??       Intake/Output Summary (Last 24 hours) at 11/30/2020 1651  Last data filed at 11/30/2020 0553  Gross per 24 hour   Intake --   Output 350 ml   Net -350 ml        General Appearance: Well developed, well nourished, alert & oriented x 3,    no acute distress.  Ears/Nose/Mouth/Throat: Pupils equal and round, Hearing grossly normal.  Neck: Supple.  JVP within normal limits. Carotids good upstrokes, with no bruit.  Chest: Lungs clear to auscultation bilaterally.  Cardiovascular: Regular rate and rhythm, S1S2 normal, no murmur, rubs, gallops.  Abdomen: Soft, non-tender, bowel sounds are active. No organomegaly.  Extremities: No edema bilaterally. Femoral pulses +2, Distal Pulses +1.  Skin: Warm and dry.  Neuro: Awake following command able to communicate appropriately.  Detailed examination deferred..    []          Post-cath site without hematoma, bruit, tenderness, or thrill.  Distal pulses intact.    CT HEAD WO CONT   Final Result   No acute intracranial hemorrhage. Large left frontal scalp hematoma.            XR CHEST PORT   Final Result      No acute cardiopulmonary process. Questionable spiculated opacity in the left   upper lobe. Recommend follow-up imaging.         XR HIP LT W OR WO PELV 2-3 VWS   Final Result   No acute abnormality.      CT HEAD W WO CONT    (Results Pending)   CT HEAD WO CONT    (Results Pending)        Data:      Telemetry:    EKG:  []   No new EKG for review.        Prior to Admission medications    Medication Sig Start Date End Date Taking? Authorizing Provider   fluticasone-umeclidin-vilanter (TRELEGY ELLIPTA) 200-62.5-25 mcg inhaler Trelegy Ellipta 200 mcg-62.5 mcg-25 mcg powder for inhalation   INHALE 1 PUFF BY MOUTH ONCE A DAY 08/19/20  Provider, Historical    albuterol (PROVENTIL HFA, VENTOLIN HFA, PROAIR HFA) 90 mcg/actuation inhaler albuterol sulfate HFA 90 mcg/actuation aerosol inhaler   INHALE 2 PUFFS EVERY 6 HOURS AS NEEDED    Provider, Historical   methocarbamoL (ROBAXIN) 500 mg tablet Take 500 mg by mouth nightly as needed. 10/21/20   Provider, Historical   omeprazole (PRILOSEC) 20 mg capsule Take 20 mg by mouth daily. 08/11/20   Provider, Historical   rosuvastatin (CRESTOR) 10 mg tablet Take 10 mg by mouth daily.    Provider, Historical       Recent Results (from the past 24 hour(s))   ECHO ADULT COMPLETE    Collection Time: 11/30/20  3:45 PM   Result Value Ref Range    LV EDV A2C 53 mL    LV EDV A4C 64 mL    LV ESV A2C 27 mL    LV ESV A4C 31 mL    IVSd 1.2 (A) 0.6 - 0.9 cm    LVIDd 3.5 (A) 3.9 - 5.3 cm    LVIDs 2.4 cm    LVOT Diameter 2.0 cm    LVOT Mean Gradient 1 mmHg    LVOT VTI 14.3 cm    LVOT Peak Velocity 0.8 m/s    LVOT Peak Gradient 3 mmHg    LVPWd 1.1 (A) 0.6 - 0.9 cm    LV E' Lateral Velocity 8 cm/s    LV E' Septal Velocity 4 cm/s    LV Ejection Fraction A2C 50 %    LV Ejection Fraction A4C 52 %    EF BP 52 (A) 55 - 100 %    LVOT Area 3.1 cm2    LVOT SV 44.9 ml    LA Minor Axis 4.4 cm    LA Major Axis 3.5 cm    LA Area 2C 12.3 cm2    LA Area 4C 8.3 cm2    LA Diameter 3.0 cm    RA Area 4C 10.7 cm2    RA Volume 24 ml    AV Mean Gradient 3 mmHg    AV VTI 16.5 cm    AV Mean Velocity 0.8 m/s    AV Peak Velocity 1.0 m/s    AV Peak Gradient 4 mmHg    AV Area by VTI 2.7 cm2    AV Area by Peak Velocity 2.7 cm2    Aortic Root 3.7 cm    MV E Wave Deceleration Time 107.0 ms    MV E Velocity 0.63 m/s    MV Mean Gradient 2 mmHg    MV VTI 17.9 cm    MV Mean Velocity 0.6 m/s    MV Max Velocity 1.2 m/s    MV Peak Gradient 5 mmHg    MV Area by VTI 2.5 cm2    RV Basal Dimension 5.0 cm    TAPSE 2.0 1.7 cm    Fractional Shortening 2D 31 28 - 44 %    LV ESV Index A4C 21 mL/m2    LV EDV Index A4C 43 mL/m2    LV ESV Index A2C 18 mL/m2    LV EDV Index A2C 36 mL/m2    LVIDd  Index 2.36 cm/m2    LVIDs Index 1.62 cm/m2    LV RWT Ratio 0.63     LV Mass 2D 127.3 67 - 162 g    LV Mass 2D Index 86.0 43 - 95 g/m2    E/E' Ratio (Averaged) 11.81     E/E' Lateral 7.88  E/E' Septal 15.75     LVOT Stroke Volume Index 30.3 mL/m2    LA Size Index 2.03 cm/m2    LA/AO Root Ratio 0.81     RA Volume Index A4C 16 mL/m2    Ao Root Index 2.50 cm/m2    AV Velocity Ratio 0.80     LVOT:AV VTI Index 0.87     AVA/BSA VTI 1.8 cm2/m2    AVA/BSA Peak Velocity 1.8 cm2/m2    RU:EAVW VTI Index 1.25         Lahoma Rocker, MD

## 2020-11-30 NOTE — Progress Notes (Signed)
 PHYSICAL THERAPY TREATMENT  Patient: [name] (75 y.o. female)  Date: 11/30/2020  Diagnosis: Cardiac syncope [R55] Ambulatory dysfunction      Precautions:    Chart, physical therapy assessment, plan of care and goals were reviewed.    ASSESSMENT  Patient continues with skilled PT services and is progressing towards goals. Pt supine in bed upon PT arrival, agreeable to session. (See below for objective details and assist levels). Overall pt tolerated session fairly today. Patient came to EOB when she reported 8/10 dizziness and sat EOB unsupported for 3-5 minutes to let dizziness subside. EOB BP was 125/88. Patient then performed SPT to bedside commode to finish BM. Patient then stood at [facility] to perform self peri care before ambulating 6 ,more feet to recliner chair where patient sat in chair. During all gait / standing patient reported increased dizziness in 8-10/10 range that went away with sitting. Patient then set up for breakfast and left seated in chair with call bell within reach and all needs meet. Nursing given report on how patient performed.     Current Level of Function Impacting Discharge (mobility/balance): impaired balance, general weakness, poor activity tolerance, dizziness with OOB mobility    Other factors to consider for discharge: PLOF, assistance at home level of deficits, acute medical state         PLAN :  Patient continues to benefit from skilled intervention to address the above impairments.  Continue treatment per established plan of care to address goals.      Recommendation for discharge: (in order for the patient to meet his/her long term goals)  Skilled Nursing Facility    This discharge recommendation:  Has been made in collaboration with the attending provider and/or case management    IF patient discharges home will need the following DME: to be determined (TBD)       SUBJECTIVE:   Patient stated "why am I so dizzy."    OBJECTIVE DATA SUMMARY:   Critical Behavior:  Neurologic  State: Alert  Orientation Level: Oriented X4  Cognition: Appropriate decision making, Follows commands     Functional Mobility Training:  Bed Mobility:  Rolling: Stand-by assistance  Supine to Sit: Stand-by assistance  Scooting: Stand-by assistance  Transfers:  Sit to Stand: Contact guard assistance  Stand to Sit: Contact guard assistance  Balance:  Sitting: Intact  Standing: Impaired;With support  Standing - Static: Fair;Constant support  Standing - Dynamic : Fair;Constant support  Ambulation/Gait Training:  Distance (ft): 10 Feet (ft)  Assistive Device: Gait belt;Walker, rolling  Ambulation - Level of Assistance: Contact guard assistance     Pain Rating:  No pain reported during session    Activity Tolerance:   Fair and requires rest breaks    After treatment patient left in no apparent distress:   Bed locked and returned to lowest position, Sitting in chair and Call bell within reach      COMMUNICATION/COLLABORATION:   The patient's plan of care was discussed with: Registered nurse.     GOALS:    Problem: Mobility Impaired (Adult and Pediatric)  Goal: *Acute Goals and Plan of Care (Insert Text)  Description: Patient stated goal: Get ebtter  Patient will move from supine to sit and sit to supine , scoot up and down, and roll side to side in bed with modified independence within 7 day(s).    Patient will transfer from bed to chair and chair to bed with modified independence using the least restrictive device within 7 day(s).  Patient will improve static standing balance to modified independence within 1 week(s).  Patient will ambulate 60 feet with modified independence with least restrictive device within 1 weeks.     Outcome: Progressing Towards Goal       Donnice Marsh, PTA, PT   Time Calculation: 39 mins

## 2020-12-01 ENCOUNTER — Observation Stay: Admit: 2020-12-01 | Payer: MEDICARE | Primary: Internal Medicine

## 2020-12-01 LAB — ECHO ADULT COMPLETE
AV Area by Peak Velocity: 2.7 cm2
AV Area by VTI: 2.7 cm2
AV Mean Gradient: 3 mmHg
AV Mean Velocity: 0.8 m/s
AV Peak Gradient: 4 mmHg
AV Peak Velocity: 1 m/s
AV VTI: 16.5 cm
AV Velocity Ratio: 0.8
AVA/BSA Peak Velocity: 1.8 cm2/m2
AVA/BSA VTI: 1.8 cm2/m2
Ao Root Index: 2.5 cm/m2
Aortic Root: 3.7 cm
E/E' Lateral: 7.88
E/E' Ratio (Averaged): 11.81
E/E' Septal: 15.75
EF BP: 52 % — AB (ref 55–100)
Fractional Shortening 2D: 31 % (ref 28–44)
IVSd: 1.2 cm — AB (ref 0.6–0.9)
LA Area 2C: 12.3 cm2
LA Area 4C: 8.3 cm2
LA Diameter: 3 cm
LA Major Axis: 3.5 cm
LA Minor Axis: 4.4 cm
LA Size Index: 2.03 cm/m2
LA/AO Root Ratio: 0.81
LV E' Lateral Velocity: 8 cm/s
LV E' Septal Velocity: 4 cm/s
LV EDV A2C: 53 mL
LV EDV A4C: 64 mL
LV EDV Index A2C: 36 mL/m2
LV EDV Index A4C: 43 mL/m2
LV ESV A2C: 27 mL
LV ESV A4C: 31 mL
LV ESV Index A2C: 18 mL/m2
LV ESV Index A4C: 21 mL/m2
LV Ejection Fraction A2C: 50 %
LV Ejection Fraction A4C: 52 %
LV Mass 2D Index: 86 g/m2 (ref 43–95)
LV Mass 2D: 127.3 g (ref 67–162)
LV RWT Ratio: 0.63
LVIDd Index: 2.36 cm/m2
LVIDd: 3.5 cm — AB (ref 3.9–5.3)
LVIDs Index: 1.62 cm/m2
LVIDs: 2.4 cm
LVOT Area: 3.1 cm2
LVOT Diameter: 2 cm
LVOT Mean Gradient: 1 mmHg
LVOT Peak Gradient: 3 mmHg
LVOT Peak Velocity: 0.8 m/s
LVOT SV: 44.9 ml
LVOT Stroke Volume Index: 30.3 mL/m2
LVOT VTI: 14.3 cm
LVOT:AV VTI Index: 0.87
LVPWd: 1.1 cm — AB (ref 0.6–0.9)
MV Area by VTI: 2.5 cm2
MV E Velocity: 0.63 m/s
MV E Wave Deceleration Time: 107 ms
MV Max Velocity: 1.2 m/s
MV Mean Gradient: 2 mmHg
MV Mean Velocity: 0.6 m/s
MV Peak Gradient: 5 mmHg
MV VTI: 17.9 cm
MV:LVOT VTI Index: 1.25
RA Area 4C: 10.7 cm2
RA Volume Index A4C: 16 mL/m2
RA Volume: 24 ml
RV Basal Dimension: 5 cm
TAPSE: 2 cm (ref 1.7–?)

## 2020-12-01 LAB — TRANSTHORACIC ECHOCARDIOGRAM (TTE) COMPLETE (CONTRAST/BUBBLE/3D PRN)
AV Area by Peak Velocity: 2.7 cm2
AV Area by VTI: 2.7 cm2
AV Mean Gradient: 3 mmHg
AV Mean Velocity: 0.8 m/s
AV Peak Gradient: 4 mmHg
AV Peak Velocity: 1 m/s
AV VTI: 16.5 cm
AV Velocity Ratio: 0.8
AVA/BSA Peak Velocity: 1.8 cm2/m2
AVA/BSA VTI: 1.8 cm2/m2
Ao Root Index: 2.5 cm/m2
Aortic Root: 3.7 cm
E/E' Lateral: 7.88
E/E' Ratio (Averaged): 11.81
E/E' Septal: 15.75
EF BP: 52 % — AB (ref 55–100)
Fractional Shortening 2D: 31 % (ref 28–44)
IVSd: 1.2 cm — AB (ref 0.6–0.9)
LA Area 2C: 12.3 cm2
LA Area 4C: 8.3 cm2
LA Diameter: 3 cm
LA Major Axis: 3.5 cm
LA Minor Axis: 4.4 cm
LA Size Index: 2.03 cm/m2
LA/AO Root Ratio: 0.81
LV E' Lateral Velocity: 8 cm/s
LV E' Septal Velocity: 4 cm/s
LV EDV A2C: 53 mL
LV EDV A4C: 64 mL
LV EDV Index A2C: 36 mL/m2
LV EDV Index A4C: 43 mL/m2
LV ESV A2C: 27 mL
LV ESV A4C: 31 mL
LV ESV Index A2C: 18 mL/m2
LV ESV Index A4C: 21 mL/m2
LV Ejection Fraction A2C: 50 %
LV Ejection Fraction A4C: 52 %
LV Mass 2D Index: 86 g/m2 (ref 43–95)
LV Mass 2D: 127.3 g (ref 67–162)
LV RWT Ratio: 0.63
LVIDd Index: 2.36 cm/m2
LVIDd: 3.5 cm — AB (ref 3.9–5.3)
LVIDs Index: 1.62 cm/m2
LVIDs: 2.4 cm
LVOT Area: 3.1 cm2
LVOT Diameter: 2 cm
LVOT Mean Gradient: 1 mmHg
LVOT Peak Gradient: 3 mmHg
LVOT Peak Velocity: 0.8 m/s
LVOT SV: 44.9 ml
LVOT Stroke Volume Index: 30.3 mL/m2
LVOT VTI: 14.3 cm
LVOT:AV VTI Index: 0.87
LVPWd: 1.1 cm — AB (ref 0.6–0.9)
Left Ventricular Ejection Fraction: 53
MV Area by VTI: 2.5 cm2
MV E Velocity: 0.63 m/s
MV E Wave Deceleration Time: 107 ms
MV Max Velocity: 1.2 m/s
MV Mean Gradient: 2 mmHg
MV Mean Velocity: 0.6 m/s
MV Peak Gradient: 5 mmHg
MV VTI: 17.9 cm
MV:LVOT VTI Index: 1.25
RA Area 4C: 10.7 cm2
RA Volume Index A4C: 16 mL/m2
RA Volume: 24 ml
RV Basal Dimension: 5 cm
TAPSE: 2 cm

## 2020-12-01 MED ORDER — ACETAMINOPHEN 325 MG TABLET
325 mg | ORAL_TABLET | ORAL | 0 refills | Status: AC | PRN
Start: 2020-12-01 — End: 2020-12-31

## 2020-12-01 MED FILL — OXYCODONE 5 MG TAB: 5 mg | ORAL | Qty: 1

## 2020-12-01 MED FILL — PANTOPRAZOLE 40 MG TAB, DELAYED RELEASE: 40 mg | ORAL | Qty: 1

## 2020-12-01 MED FILL — SYMBICORT 160 MCG-4.5 MCG/ACTUATION HFA AEROSOL INHALER: RESPIRATORY_TRACT | Qty: 6

## 2020-12-01 MED FILL — SPIRIVA RESPIMAT 2.5 MCG/ACTUATION SOLUTION FOR INHALATION: 2.5 mcg/actuation | RESPIRATORY_TRACT | Qty: 4

## 2020-12-01 MED FILL — POTASSIUM CHLORIDE SR 20 MEQ TAB, PARTICLES/CRYSTALS: 20 mEq | ORAL | Qty: 2

## 2020-12-01 MED FILL — ATORVASTATIN 20 MG TAB: 20 mg | ORAL | Qty: 1

## 2020-12-01 MED FILL — MELATONIN 3 MG TAB: 3 mg | ORAL | Qty: 1

## 2020-12-01 NOTE — Progress Notes (Signed)
Discharge plan of care/case management plan validated with provider discharge order.    Pt discharging to [facility]. Pt dressed and waiting for transportation. Pick up time 1530. Pt has one bag leaving with her. Pt complains of dizziness. States she has been dizzy since she has been here. Pt rates pain a 4/10.     Vital signs obtained, IV site removed. Telebox removed and sent back to [facility] [address].

## 2020-12-01 NOTE — Progress Notes (Signed)
Attempted to call report to [facility]. Front desk transferred me several times and I ended up at a voicemail for  a [place]. Will try again to call report in a few minutes.

## 2020-12-01 NOTE — Progress Notes (Signed)
Transportation picked up patient for transport to [facility].

## 2020-12-01 NOTE — Progress Notes (Signed)
Progress Note      12/01/2020 2:50 PM  NAME: [name]   MRN:  [id number]   Admit Diagnosis: Cardiac syncope [R55]      Problem List:   -Syncope  -COPD  -Dyslipidemia  -Insomnia     Assessment/Plan:   -Patient is lying comfortably in the bed.  -No acute cardiovascular event overnight.  Telemetry reveals sinus rhythm.  -Patient is awake alert oriented cooperative pleasant and denies any symptoms of chest pain shortness of breath palpitation or any other anginal equivalent.  -Normal ejection fraction of 55% per echocardiogram.  -Based on my overall cardiac assessment until proven otherwise this may be a orthostatic hypotension versus vasovagal syncopal episode.  -Agree to proceed with acute rehab followed by cardiology follow-up visit in my office in 2 weeks for further assessment and management as clinically indicated.  -Okay to discharge per my cardiac assessment.         []        High complexity decision making was performed in this patient at high risk for decompensation with multiple organ involvement.    Subjective:     [name] is a 75 y.o.  Caucasian female who no known established coronary artery disease and admitted to the hospital after she passed out at home. When I saw the patient she was lying comfortably in the bed awake alert oriented cooperative pleasant and able to communicate with me appropriately. She stated that she was at home and fell.  She denies any palpitation dizziness chest pain or shortness of breath or any other anginal equivalent prior to passing out. She has no known established coronary artery disease and do not recall any cardiovascular testing being done in the recent past. Her coronary artery disease risk factors include age greater than 47 and dyslipidemia. We will check serial cardiac enzymes monitor her on telemetry and check echocardiogram and then make further cardiac management decision as clinically warranted    Review of Systems:    Symptom Y/N Comments  Symptom Y/N  Comments   Fever/Chills N   Chest Pain N    Poor Appetite N   Edema N    Cough N   Abdominal Pain N    Sputum N   Joint Pain N    SOB/DOE N   Pruritis/Rash N    Nausea/vomit N   Tolerating PT/OT Y    Diarrhea N   Tolerating Diet Y    Constipation N   Other       Could NOT obtain due to:      Objective:      Physical Exam:    Last 24hrs VS reviewed since prior progress note. Most recent are:    Visit Vitals  BP 106/73 (BP 1 Location: Left upper arm, BP Patient Position: Standing)   Pulse (!) 124   Temp 98.2 ??F (36.8 ??C)   Resp 18   Ht 5' (1.524 m)   Wt 52.2 kg (115 lb)   SpO2 98%   BMI 22.46 kg/m??     No intake or output data in the 24 hours ending 12/01/20 1450     General Appearance: Well developed, well nourished, alert & oriented x 3,    no acute distress.  Ears/Nose/Mouth/Throat: Hearing grossly normal.  Neck: Supple.  Chest: Lungs clear to auscultation bilaterally.  Cardiovascular: Regular rate and rhythm, S1S2 normal, no murmur.  Abdomen: Soft, non-tender, bowel sounds are active.  Extremities: No edema bilaterally.  Skin: Warm and dry.    []   Post-cath site without hematoma, bruit, tenderness, or thrill.  Distal pulses intact.    PMH/SH reviewed - no change compared to H&P    Data Review    Telemetry: normal sinus rhythm     EKG:   []   No new EKG for review  CT HEAD WO CONT   Final Result   Left frontal/supraorbital scalp hematoma without associated skull fracture.      No intracranial hemorrhage or acute infarct.      Extensive chronic microangiopathic white matter disease.            CT HEAD WO CONT   Final Result   No acute intracranial hemorrhage. Large left frontal scalp hematoma.            XR CHEST PORT   Final Result      No acute cardiopulmonary process. Questionable spiculated opacity in the left   upper lobe. Recommend follow-up imaging.         XR HIP LT W OR WO PELV 2-3 VWS   Final Result   No acute abnormality.           Lab Data Personally Reviewed:    Recent Labs     11/29/20  0759    WBC 9.8   HGB 11.3*   HCT 35.8   PLT 341     No results for input(s): INR, PTP, APTT, INREXT in the last 72 hours.   Recent Labs     11/29/20  0759   NA 139   K 4.8   CL 109*   CO2 26   BUN 26*   CREA 0.72   GLU 105*   CA 9.2     No results for input(s): CPK, CKNDX, TROIQ in the last 72 hours.    No lab exists for component: CPKMB  No results found for: CHOL, CHOLX, CHLST, CHOLV, HDL, HDLP, LDL, LDLC, DLDLP, TGLX, TRIGL, TRIGP, CHHD, CHHDX    No results for input(s): AP, TBIL, TP, ALB, GLOB, GGT, AML, LPSE in the last 72 hours.    No lab exists for component: SGOT, GPT, AMYP, HLPSE  No results for input(s): PH, PCO2, PO2 in the last 72 hours.    Medications Personally Reviewed:    Current Facility-Administered Medications   Medication Dose Route Frequency    acetaminophen (TYLENOL) tablet 650 mg  650 mg Oral Q4H PRN    Or    acetaminophen (TYLENOL) suppository 650 mg  650 mg Rectal Q6H PRN    oxyCODONE IR (ROXICODONE) tablet 5 mg  5 mg Oral Q6H PRN    albuterol (PROVENTIL HFA, VENTOLIN HFA, PROAIR HFA) inhaler 2 Puff  2 Puff Inhalation Q4H PRN    pantoprazole (PROTONIX) tablet 40 mg  40 mg Oral ACB    atorvastatin (LIPITOR) tablet 20 mg  20 mg Oral DAILY    budesonide-formoteroL (SYMBICORT) 160-4.5 mcg/actuation HFA inhaler 1 Puff  1 Puff Inhalation BID RT    And    tiotropium bromide (SPIRIVA RESPIMAT) 2.5 mcg /actuation  2 Puff Inhalation DAILY    sodium chloride (NS) flush 5-40 mL  5-40 mL IntraVENous Q8H    sodium chloride (NS) flush 5-40 mL  5-40 mL IntraVENous PRN    polyethylene glycol (MIRALAX) packet 17 g  17 g Oral DAILY PRN    ondansetron (ZOFRAN ODT) tablet 4 mg  4 mg Oral Q8H PRN    Or    ondansetron (ZOFRAN) injection 4 mg  4 mg IntraVENous Q6H PRN  potassium chloride (K-DUR, KLOR-CON M20) SR tablet 40 mEq  40 mEq Oral DAILY    melatonin tablet 3 mg  3 mg Oral QHS         Lahoma Rocker, MD

## 2020-12-01 NOTE — Progress Notes (Signed)
PHYSICAL THERAPY TREATMENT  Patient: [name] (75 y.o. female)  Date: 12/01/2020  Diagnosis: Cardiac syncope [R55] Ambulatory dysfunction      Precautions:    Chart, physical therapy assessment, plan of care and goals were reviewed.    ASSESSMENT  Patient continues with skilled PT services and is progressing towards goals. Pt supine in bed upon PT arrival, agreeable to session. (See below for objective details and assist levels). Overall pt tolerated session good today. Patient came to sitting EOB and stood at [facility] for 1 minute for peri care/ pad changing without any reported dizziness. Patient then ambulated 14 feet inside room with RW reporting no dizziness at first but then after 5-6 feet reporting dizziness. Patient sat in recliner chair where she reported that while dizziness was present it wasn't as bad as it had been. Patient gait was also slow slightly unsteady with narrow shuffled gait but no knee buckling and 1 minor LOB that patient was able to self correct.Patient then left seated in recliner chair with call bell within reach and all needs meet.  Current Level of Function Impacting Discharge (mobility/balance): impaired balance, general weakness, poor activity tolerance    Other factors to consider for discharge: PLOF, assistance at home, level of deficits,a cute medical state         PLAN :  Patient continues to benefit from skilled intervention to address the above impairments.  Continue treatment per established plan of care to address goals.    Recommendation for discharge: (in order for the patient to meet his/her long term goals)  Skilled Nursing Facility    This discharge recommendation:  Has been made in collaboration with the attending provider and/or case management    IF patient discharges home will need the following DME: to be determined (TBD)       SUBJECTIVE:   Patient stated "the dizziness is till there but its better."    OBJECTIVE DATA SUMMARY:   Critical Behavior:  Neurologic State:  Alert  Orientation Level: Oriented X4  Cognition: Follows commands     Functional Mobility Training:  Bed Mobility:  Rolling: Stand-by assistance  Supine to Sit: Stand-by assistance  Scooting: Stand-by assistance  Transfers:  Sit to Stand: Contact guard assistance  Stand to Sit: Contact guard assistance  Balance:  Sitting: Intact  Standing: Impaired;With support  Standing - Static: Good;Constant support  Standing - Dynamic : Fair;Constant support  Ambulation/Gait Training:  Distance (ft): 14 Feet (ft)  Assistive Device: Gait belt;Walker, rolling  Ambulation - Level of Assistance: Contact guard assistance     Pain Rating:  No pain reported during session    Activity Tolerance:   Fair and requires rest breaks    After treatment patient left in no apparent distress:   Bed locked and returned to lowest position, Sitting in chair and Call bell within reach      COMMUNICATION/COLLABORATION:   The patient's plan of care was discussed with: Registered nurse.     GOALS:    Problem: Mobility Impaired (Adult and Pediatric)  Goal: *Acute Goals and Plan of Care (Insert Text)  Description: Patient stated goal: Get ebtter  Patient will move from supine to sit and sit to supine , scoot up and down, and roll side to side in bed with modified independence within 7 day(s).    Patient will transfer from bed to chair and chair to bed with modified independence using the least restrictive device within 7 day(s).    Patient will improve static  standing balance to modified independence within 1 week(s).  Patient will ambulate 60 feet with modified independence with least restrictive device within 1 weeks.     Outcome: Progressing Towards Goal       Marcelline Mates, PTA, PT   Time Calculation: 28 mins

## 2020-12-01 NOTE — Discharge Summary (Signed)
Discharge Summary by Estill Batten, NP at 12/01/20 1437                Author: Estill Batten, NP  Service: Hospitalist  Author Type: Nurse Practitioner       Filed: 12/01/20 1445  Date of Service: 12/01/20 1437  Status: Signed           Editor: Estill Batten, NP (Nurse Practitioner)  Cosigner: Arnette Felts, MD at 12/01/20 1834                                              Hospitalist Discharge Summary        Patient ID:     [name]   [id number]   75 y.o.   May 31, 1945      Admit date: 11/27/2020      Discharge date : 12/01/2020            Final Diagnoses:    Principal Problem:     Ambulatory dysfunction (11/27/2020)      Active Problems:     Cardiac syncope (11/27/2020)            Reason for Hospitalization:   Falls, syncope      Hospital Course:    Pt admitted for recurrent falls, and syncope. CT head did not show acute intracranial hemorrhage, there is a  left frontal scalp hematoma. She states had past falls, moved down to [place] with friend  after her husband passed away last year.    Repeat CT scan of the head did not show any intracranial hemorrhage, still left frontal/supraorbital scalp hematoma without skull fracture. There is evidence of chronic microangiopathic white matter disease. Echocardiogram showing  EF 50-55%, with low  normal LV systolic function. There is reduced systolic function in right ventricle, mild sclerosis of the aortic valve with mild regurgitation. Right atrium is mildy dilated. Cardiology recommending follow up as outpatient. Pt is stable for discharge  today to SNF. Continue current medications as prescribed. Pt is in agreement with plan.       Discharge Medications:      Current Discharge Medication List                 START taking these medications          Details        acetaminophen (TYLENOL) 325 mg tablet  Take 2 Tablets by mouth every four (4) hours as needed for Pain or Fever for up to 30 days.   Qty: 120 Tablet, Refills: 0   Start date: 12/01/2020, End date:  12/31/2020                        CONTINUE these medications which have NOT CHANGED          Details        fluticasone-umeclidin-vilanter (TRELEGY ELLIPTA) 200-62.5-25 mcg inhaler  Trelegy Ellipta 200 mcg-62.5 mcg-25 mcg powder for inhalation    INHALE 1 PUFF BY MOUTH ONCE A DAY               albuterol (PROVENTIL HFA, VENTOLIN HFA, PROAIR HFA) 90 mcg/actuation inhaler  albuterol sulfate HFA 90 mcg/actuation aerosol inhaler    INHALE 2 PUFFS EVERY 6 HOURS AS NEEDED  omeprazole (PRILOSEC) 20 mg capsule  Take 20 mg by mouth daily.               rosuvastatin (CRESTOR) 10 mg tablet  Take 10 mg by mouth daily.                        STOP taking these medications                  methocarbamoL (ROBAXIN) 500 mg tablet  Comments:    Reason for Stopping:                                Follow up Care:     1. Cioflec, Doran Durand, MD in 2 weeks.          Follow-up Information                  Follow up With  Specialties  Details  Why  Contact Info              Cioflec, Doran Durand, MD  Internal Medicine Physician  Follow up in 2 week(s)    [address]   [address] [place] [address]   [phone]                 Lahoma Rocker, MD  Cardio Vascular Surgery, Cardiovascular Disease Physician, Interventional Cardiology Physician  Follow up in 2 week(s)    [address] [place] [address]   [phone]                           * Follow-up Care/Patient Instructions:   Activity: Activity as tolerated   Diet: Regular Diet   Wound Care: None needed        Condition at Discharge:  Stable   __________________________________________________________________      Disposition   Skilled Nursing Facility   ____________________________________________________________________      Code Status:   Full Code   ___________________________________________________________________      Discharge Exam:   Patient seen and examined by me on discharge day.   Physical Exam    Constitutional:        General: She is not in  acute distress.   HENT:       Head: Normocephalic and atraumatic. Right periorbital erythema and left periorbital  erythema present.       Comments: Left forehead hematoma , facial bruising   Cardiovascular:       Rate and Rhythm: Normal rate and regular rhythm.       Pulses: Normal pulses.       Heart sounds: Normal heart sounds.    Pulmonary:       Effort: Pulmonary effort is normal.       Breath sounds: Normal breath sounds.    Abdominal:       General: Bowel sounds are normal.       Palpations: Abdomen is soft.    Musculoskeletal:          General: Normal range of motion.    Skin:      General: Skin is warm and dry.    Neurological:       Mental Status: She is oriented to person, place, and time.    Psychiatric:          Mood and  Affect: Mood normal.          Behavior: Behavior normal.    CONSULTATIONS: Cardiology      Significant Diagnostic Studies:      Recent Results (from the past 24 hour(s))     ECHO ADULT COMPLETE          Collection Time: 11/30/20  3:45 PM         Result  Value  Ref Range            LV EDV A2C  53  mL       LV EDV A4C  64  mL       LV ESV A2C  27  mL       LV ESV A4C  31  mL       IVSd  1.2 (A)  0.6 - 0.9 cm       LVIDd  3.5 (A)  3.9 - 5.3 cm       LVIDs  2.4  cm       LVOT Diameter  2.0  cm            LVOT Mean Gradient  1  mmHg            LVOT VTI  14.3  cm       LVOT Peak Velocity  0.8  m/s       LVOT Peak Gradient  3  mmHg       LVPWd  1.1 (A)  0.6 - 0.9 cm       LV E' Lateral Velocity  8  cm/s       LV E' Septal Velocity  4  cm/s       LV Ejection Fraction A2C  50  %       LV Ejection Fraction A4C  52  %       EF BP  52 (A)  55 - 100 %       LVOT Area  3.1  cm2       LVOT SV  44.9  ml       LA Minor Axis  4.4  cm       LA Major Axis  3.5  cm       LA Area 2C  12.3  cm2       LA Area 4C  8.3  cm2       LA Diameter  3.0  cm       RA Area 4C  10.7  cm2       RA Volume  24  ml       AV Mean Gradient  3  mmHg       AV VTI  16.5  cm       AV Mean Velocity  0.8  m/s       AV Peak Velocity   1.0  m/s       AV Peak Gradient  4  mmHg       AV Area by VTI  2.7  cm2       AV Area by Peak Velocity  2.7  cm2       Aortic Root  3.7  cm       MV E Wave Deceleration Time  107.0  ms       MV E Velocity  0.63  m/s       MV Mean Gradient  2  mmHg  MV VTI  17.9  cm       MV Mean Velocity  0.6  m/s       MV Max Velocity  1.2  m/s       MV Peak Gradient  5  mmHg       MV Area by VTI  2.5  cm2       RV Basal Dimension  5.0  cm       TAPSE  2.0  1.7 cm       Fractional Shortening 2D  31  28 - 44 %       LV ESV Index A4C  21  mL/m2       LV EDV Index A4C  43  mL/m2       LV ESV Index A2C  18  mL/m2       LV EDV Index A2C  36  mL/m2       LVIDd Index  2.36  cm/m2       LVIDs Index  1.62  cm/m2       LV RWT Ratio  0.63         LV Mass 2D  127.3  67 - 162 g       LV Mass 2D Index  86.0  43 - 95 g/m2            E/E' Ratio (Averaged)  11.81              E/E' Lateral  7.88         E/E' Septal  15.75         LVOT Stroke Volume Index  30.3  mL/m2       LA Size Index  2.03  cm/m2       LA/AO Root Ratio  0.81         RA Volume Index A4C  16  mL/m2       Ao Root Index  2.50  cm/m2       AV Velocity Ratio  0.80         LVOT:AV VTI Index  0.87         AVA/BSA VTI  1.8  cm2/m2       AVA/BSA Peak Velocity  1.8  cm2/m2            ZO:XWRU VTI Index  1.25            CT HEAD WO CONT       Final Result     Left frontal/supraorbital scalp hematoma without associated skull fracture.          No intracranial hemorrhage or acute infarct.          Extensive chronic microangiopathic white matter disease.                      CT HEAD WO CONT       Final Result     No acute intracranial hemorrhage. Large left frontal scalp hematoma.                      XR CHEST PORT       Final Result          No acute cardiopulmonary process. Questionable spiculated opacity in the left     upper lobe. Recommend follow-up imaging.                 XR HIP LT W OR WO  PELV 2-3 VWS       Final Result     No acute abnormality.                  Discharge: time spent   35 minutes in discharge   Education and counseling.       Signed:   Estill Batten, NP   12/01/2020   2:37 PM

## 2020-12-01 NOTE — Progress Notes (Signed)
Patients friend [name] approached CM at nurse's station stating that she was leaving the hospital to go home and wanted to know if transport can be arranged for patient so that she didn't have to come back up to the hospital to pick patient up if she was cleared to DC today. CM informed [name] that Medicare only covers stretcher transport and they will not cover the cost if the patient can sit in a chair and has no qualifying bed bound diagnosis. CM stated that transportation will need to be paid for out-of-pocket. [name] verbalized understanding and requested cost of transport.    CM called [facility] and spoke with [place]. Quote given for wheelchair transport- $122.88. CM notified [name] and she agreed with price and paid by phone. Transport set up as a will call as patient does not have a DC yet.    ----------------    1440Sabra Heck, NP informed CM that patient is cleared to DC. DC order received. CM notified [facility] via [facility], requesting room number.    Patient will go to [facility], room 205A and nurse can call report to [phone] [facility] Unit).    CM notified patient and called patients friend, [name] to notify of DC.    CM called [facility] and spoke with [place]. Transport confirmed for 3:30pm.    Discharge plan of care/case management plan validated with provider discharge order.

## 2021-01-02 ENCOUNTER — Emergency Department: Admit: 2021-01-02 | Payer: MEDICARE | Primary: Internal Medicine

## 2021-01-02 ENCOUNTER — Inpatient Hospital Stay
Admit: 2021-01-02 | Discharge: 2021-01-10 | Disposition: A | Discharge status: Skilled Nursing Facility | Payer: MEDICARE | Attending: Internal Medicine | Admitting: Internal Medicine

## 2021-01-02 DIAGNOSIS — R000 Tachycardia, unspecified: Secondary | ICD-10-CM

## 2021-01-02 DIAGNOSIS — A419 Sepsis, unspecified organism: Principal | ICD-10-CM

## 2021-01-02 NOTE — ED Notes (Signed)
Lab called at this time regarding add-on urine culture.

## 2021-01-02 NOTE — ED Notes (Signed)
Care assumed and bedside SBAR report endorsed on Caitlin H. Pt cardiac monitoring continued , spO2 Monitoring continued, IV reassed, call bell within reach, side rails up x2, resting comfortable but easily arousable, no signs of acute distress., bed in lowest position, MAR reviewed, Labs reviewed, will continue to monitor

## 2021-01-02 NOTE — ED Notes (Signed)
 Patient c/o I have a bad headache. Will relay to MD.

## 2021-01-02 NOTE — ED Notes (Signed)
Patient placed on bedpan at this time.

## 2021-01-02 NOTE — ED Notes (Signed)
EMS stated they where called for recurring falls.

## 2021-01-02 NOTE — ED Provider Notes (Signed)
ED Provider Notes by Rada Hay, MD at 01/02/21 2110                Author: Rada Hay, MD  Service: --  Author Type: Physician       Filed: 01/02/21 2148  Date of Service: 01/02/21 2110  Status: Signed          Editor: Rada Hay, MD (Physician)               EMERGENCY DEPARTMENT HISTORY AND PHYSICAL EXAM           Date: 01/02/2021   Patient Name: [name]        History of Presenting Illness          Chief Complaint       Patient presents with        ?  Fall           History Provided By: Patient, EMS      HPI: [name], 75 y.o. female patient with history as reported below, recent admission for multiple falls presenting to the ED for fall.   Per patient she fell multiple times over the last 3 days hitting her face and head.  She is not sure what is causing her to fall as she is unable to clearly remember what happened.  Today Per EMS her friend called for falls.  He does not complain of any  acute pain aside from some head pain from contusions that she previously had.  She denies any shortness of breath, dysuria, abdominal pain, chest pain, fever or chills.       There are no other complaints, changes, or physical findings at this time.        Past History        Past Medical History:     Past Medical History:        Diagnosis  Date         ?  Chronic obstructive pulmonary disease (HCC)       ?  Hypercholesteremia       ?  Insomnia           ?  Sleep disorder             Past Surgical History:   No past surgical history on file.      Family History:   No family history on file.      Social History:     Social History          Tobacco Use         ?  Smoking status:  Former              Packs/day:  1.00         Types:  Cigarettes         Quit date:  2021         Years since quitting:  1.9         Passive exposure:  Past         ?  Smokeless tobacco:  Never       Vaping Use         ?  Vaping Use:  Never used       Substance Use Topics         ?  Alcohol use:  Yes               Alcohol/week:  1.0 standard drink  Types:  1 Cans of beer per week             Comment: 1 can of beer every 1-2 weeks         ?  Drug use:  Never           Allergies:     Allergies        Allergen  Reactions         ?  Cheese  Nausea and Vomiting             Severe vomiting           PCP: Cioflec, Adela Ports, MD        No current facility-administered medications on file prior to encounter.          Current Outpatient Medications on File Prior to Encounter          Medication  Sig  Dispense  Refill           ?  fluticasone-umeclidin-vilanter (TRELEGY ELLIPTA) 200-62.5-25 mcg inhaler  Trelegy Ellipta 200 mcg-62.5 mcg-25 mcg powder for inhalation    INHALE 1 PUFF BY MOUTH ONCE A DAY         ?  albuterol (PROVENTIL HFA, VENTOLIN HFA, PROAIR HFA) 90 mcg/actuation inhaler  albuterol sulfate HFA 90 mcg/actuation aerosol inhaler    INHALE 2 PUFFS EVERY 6 HOURS AS NEEDED         ?  omeprazole (PRILOSEC) 20 mg capsule  Take 20 mg by mouth daily.               ?  rosuvastatin (CRESTOR) 10 mg tablet  Take 10 mg by mouth daily.                 Review of Systems     Review of Systems   A 10 point review of system was performed and was negative except as noted above in HPI        Physical Exam     Physical Exam   Vitals and nursing note reviewed.    Constitutional:        General: She is not in acute distress.      Appearance: Normal appearance.    HENT:       Head:       Comments: Multiple old facial bruising along the left forehead, bilateral cheeks.  No step-off to palpation.      Nose: Nose normal.    Eyes:       Extraocular Movements: Extraocular movements intact.       Conjunctiva/sclera: Conjunctivae normal.       Pupils: Pupils are equal, round, and reactive to light.    Neck:       Comments: There is no midline pain to palpation.   Cardiovascular:       Rate and Rhythm: Tachycardia present.    Pulmonary:       Effort: Pulmonary effort is normal.       Breath sounds: Normal breath sounds.     Abdominal:        General: Abdomen is flat. There is no distension.       Tenderness: There is no abdominal tenderness. There is no guarding or rebound.     Musculoskeletal:          General: No swelling, tenderness, deformity or signs of injury.       Cervical back: Normal range of motion and neck supple.  Right lower leg: No edema.       Left lower leg: No edema.       Comments: No midline vertebral tenderness to palpation.  Although there is a kyphosis and the patient does have some paraspinal tenderness at the thoracic vertebrae     Skin:      General: Skin is warm.       Findings: Bruising present.    Neurological:       General: No focal deficit present.       Mental Status: She is alert and oriented to person, place, and time.       GCS: GCS eye subscore is 4. GCS verbal subscore is 5 . GCS motor subscore is 6.       Cranial Nerves: No cranial nerve deficit.       Sensory: No sensory deficit.       Motor: No weakness or abnormal muscle tone.            Lab and Diagnostic Study Results     Labs -         Recent Results (from the past 12 hour(s))     METABOLIC PANEL, COMPREHENSIVE          Collection Time: 01/02/21  6:36 PM         Result  Value  Ref Range            Sodium  137  136 - 145 mmol/L       Potassium  4.3  3.5 - 5.1 mmol/L       Chloride  108  97 - 108 mmol/L       CO2  20 (L)  21 - 32 mmol/L            Anion gap  9  5 - 15 mmol/L            Glucose  134 (H)  65 - 100 mg/dL       BUN  22 (H)  6 - 20 mg/dL       Creatinine  0.85  0.55 - 1.02 mg/dL       BUN/Creatinine ratio  26 (H)  12 - 20         eGFR  >60  >60 ml/min/1.68m       Calcium  9.1  8.5 - 10.1 mg/dL       Bilirubin, total  0.4  0.2 - 1.0 mg/dL       AST (SGOT)  42 (H)  15 - 37 U/L       ALT (SGPT)  45  12 - 78 U/L       Alk. phosphatase  95  45 - 117 U/L       Protein, total  7.2  6.4 - 8.2 g/dL       Albumin  3.5  3.5 - 5.0 g/dL       Globulin  3.7  2.0 - 4.0 g/dL       A-G Ratio  0.9 (L)  1.1 - 2.2         CBC WITH AUTOMATED DIFF           Collection Time: 01/02/21  6:36 PM         Result  Value  Ref Range            WBC  22.5 (H)  3.6 - 11.0 K/uL       RBC  3.73 (L)  3.80 - 5.20 M/uL       HGB  11.9  11.5 - 16.0 g/dL       HCT  36.2  35.0 - 47.0 %       MCV  97.1  80.0 - 99.0 FL       MCH  31.9  26.0 - 34.0 PG       MCHC  32.9  30.0 - 36.5 g/dL       RDW  13.3  11.5 - 14.5 %       PLATELET  312  150 - 400 K/uL       MPV  8.8 (L)  8.9 - 12.9 FL       NRBC  0.0  0.0 PER 100 WBC       ABSOLUTE NRBC  0.00  0.00 - 0.01 K/uL       NEUTROPHILS  91 (H)  32 - 75 %       LYMPHOCYTES  4 (L)  12 - 49 %       MONOCYTES  4 (L)  5 - 13 %       EOSINOPHILS  0  0 - 7 %       BASOPHILS  0  0 - 1 %       IMMATURE GRANULOCYTES  1 (H)  0 - 0.5 %            ABS. NEUTROPHILS  20.5 (H)  1.8 - 8.0 K/UL            ABS. LYMPHOCYTES  0.8  0.8 - 3.5 K/UL       ABS. MONOCYTES  0.9  0.0 - 1.0 K/UL       ABS. EOSINOPHILS  0.1  0.0 - 0.4 K/UL       ABS. BASOPHILS  0.1  0.0 - 0.1 K/UL       ABS. IMM. GRANS.  0.2 (H)  0.00 - 0.04 K/UL       DF  AUTOMATED          TROPONIN-HIGH SENSITIVITY          Collection Time: 01/02/21  6:36 PM         Result  Value  Ref Range            Troponin-High Sensitivity  8  0 - 51 ng/L       NT-PRO BNP          Collection Time: 01/02/21  6:36 PM         Result  Value  Ref Range            NT pro-BNP  205 (H)  <125 pg/mL       LACTIC ACID          Collection Time: 01/02/21  7:26 PM         Result  Value  Ref Range            Lactic acid  1.0  0.4 - 2.0 mmol/L           Radiologic Studies -    _0 @     CT Results  (Last 48 hours)                                       01/02/21 1914    CT HEAD WO CONT  Final result  Impression:    Left frontal soft tissue swelling. No acute intracranial abnormality.                                     Narrative:    EXAM: CT HEAD WO CONT             INDICATION: fall             COMPARISON: 12/01/2020.             CONTRAST: None.             TECHNIQUE: Unenhanced CT of the head was performed using 5 mm  images. Brain and      bone windows were generated. Coronal and sagittal reformats. CT dose reduction      was achieved through use of a standardized protocol tailored for this      examination and automatic exposure control for dose modulation.               FINDINGS:      The ventricles and sulci are normal in size, shape and configuration.. Small      vessel ischemic changes are stable compared to the prior exam. There is no      intracranial hemorrhage, extra-axial collection, or mass effect. The basilar      cisterns are open. No CT evidence of acute infarct.             The bone windows demonstrate no abnormalities. The visualized portions of the      paranasal sinuses and mastoid air cells are clear. Left frontal soft tissue      swelling is noted.                               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                       Narrative:    EXAM:  CT CERVICAL SPINE WITHOUT CONTRAST             INDICATION: fall.             COMPARISON: None.             CONTRAST:  None.             TECHNIQUE: Multislice helical CT of the cervical spine was performed without      intravenous contrast administration.  Sagittal and coronal reformats were      generated.  CT dose reduction was achieved through use of a standardized      protocol tailored for this examination and automatic exposure control for dose      modulation.              FINDINGS:             The alignment is within normal limits. There is no fracture or compression      deformity. The odontoid process is intact. The craniocervical junction is within      normal limits. Significant multilevel spondylosis is noted, greatest at C3-4,      C4-5, C5-C6. 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 Bilateral neural foraminal narrowing is also noted at these      levels secondary to uncovertebral osteophyte formation.                                       CXR Results  (Last 48 hours)                                       01/02/21 1831    XR CHEST PORT  Final result            Impression:    No acute cardiopulmonary process.                              Narrative:    EXAM:  XR CHEST PORT             INDICATION:   Tachycardia             COMPARISON: Chest radiographs from 11/27/2020 and 03/20/2020.             FINDINGS: AP radiograph of the chest was obtained.             No evidence of focal consolidation. Previously demonstrated ill-defined nodular      opacity in the left upper lung is not definitively seen on current study and      likely represent interval resolution versus artifactual. No pleural effusion or      pneumothorax.  Heart, hila, mediastinum are within normal limits.              No acute osseous abnormalities.                                         Medical Decision Making and ED Course     Differential Diagnosis & Medical Decision Making Provider Note:    75 year old female presents to the ED for recurrent falls.  On arrival found to be tachycardic.  Tachypnea was recorded but patient does not appear to be in acute any acute respiratory distress and  speaking full sentences and is pleasant.  His differential diagnosis includes syncope, cardiogenic syncope, dehydration, electrolyte abnormality, sepsis given her tachycardia, intracranial process given multiple falls.  Will obtain labs and CT and likely  admit.      - I am the first provider for this patient.  I reviewed the vital signs, available nursing notes, past medical history, past surgical history, family history and social history. The patients presenting problems have been discussed, and they are in agreement  with the care plan formulated and outlined with them.  I have encouraged them to ask questions as they arise throughout their visit.      Vital Signs-Reviewed the patient's vital signs.   Patient Vitals for the past 12 hrs:            Temp  Pulse  Resp  BP  SpO2             01/02/21 1853  --  (!) 124  (!) 31  133/82  95 %            01/02/21 1832  98.1 ??F (36.7 ??C)  Marland Kitchen)  129  26  131/87  93 %           ED Course:      ED Course as of 01/02/21 2148       Sun Jan 02, 2021        2111  Heart rate improved to 110 after fluids.  The patient is resting in the stretcher comfortably no acute distress  [PC]              ED Course User Index   [PC] Rosemary Pentecost, Raelyn Ensign, MD     The patient has a white count of 22, she meets sepsis criteria so we ordered blood cultures lactic acid and the patient was given fluids.  I will treat her with ceftriaxone for presumed  UTI although the source is currently unclear.  She remains pleasant and in no acute distress.  Clarified with the patient and she is not on any steroids.      Noted lactic acid 1.0.      Patient still pending UA.  Continues to appear well but given early sepsis, recent falls that are recurrent will admit for further work-up.  Discussed case with hospitalist who accepts.           Procedures     Performed by: Rada Hay, MD   Procedures          Disposition     Disposition: Admitted to Floor Medical Floor the case was discussed with the admitting physician         Diagnosis/Clinical Impression        Clinical Impression:       1.  Tachycardia      2.  Leukocytosis, unspecified type      3.  Recurrent falls         4.  Contusion of head, unspecified part of head, sequela            Attestations: I,  Rada Hay, MD, am the primary clinician of record.      Please note that this dictation was completed with Dragon, the computer voice recognition software.  Quite often unanticipated grammatical, syntax, homophones, and other interpretive errors are inadvertently  transcribed by the computer software.  Please disregard these errors.  Please excuse any errors that have escaped final proofreading.  Thank you.

## 2021-01-02 NOTE — H&P (Signed)
H&P by Crawford Givens, MD at 01/02/21 2340                Author: Crawford Givens, MD  Service: Hospitalist  Author Type: Physician       Filed: 01/03/21 0500  Date of Service: 01/02/21 2340  Status: Signed          Editor: Crawford Givens, MD (Physician)                  Hospitalist History & Physical Notes.          [facility].                    Name : [name]       MRN number : [id number]      Date of Birth : September 14, 1945         Subjective :     Chief Complaint : Recurrent falls with difficulty ambulating.      Source of information : Patient, ED provider, previous records.        History of present illness:     75 y.o. female with history of COPD stable on treatment maintenance inhalers, hyperlipidemia, history of recurrent falls with generalized weakness suspected of cardiac syncope presents to the emergency room complaining  of recurrent falls.  Last 3 days she was falling and injuring herself and she does not know how she is falling.  Admits dizziness with ambulation.  Denies any chest pain or palpitations.  Denies any fever or chills.  Admits appetite is decreased and not  eating well.      On review of previous admission physical therapy Occupational Therapy notes this patient does have trouble with gait short and shuffled gait with unsteadiness, has dizziness with ambulation was discharged to [facility] care rehab facility  on November 9.  But not sure how long she was there when she went home.      Evaluation in the emergency room significant leukocytosis with left shift, negative evidence of infection, chest x-ray did not show any pneumonia.  Urine analysis is clear, skin with no wounds.  Negative for COVID-19, influenza.  She seems to have infection  but source is unclear at this time.      She does have mild cough but states not producing any sputum.  It sounded congested when I was examining her.     Past Medical History:        Diagnosis   Date         ?  Chronic obstructive pulmonary disease (HCC)       ?  Hypercholesteremia       ?  Insomnia           ?  Sleep disorder       Compression fracture of thoracic vertebra body, thoracic degenerative disc disease and kyphosis.         Past surgical history: None      Family history: Not relevant at this time.  And unable to get information.        Social History          Tobacco Use         ?  Smoking status:  Former              Packs/day:  1.00         Types:  Cigarettes         Quit date:  2021  Years since quitting:  1.9         Passive exposure:  Past         ?  Smokeless tobacco:  Never       Substance Use Topics         ?  Alcohol use:  Yes              Alcohol/week:  1.0 standard drink         Types:  1 Cans of beer per week             Comment: 1 can of beer every 1-2 weeks            Prior to Admission medications             Medication  Sig  Start Date  End Date  Taking?  Authorizing Provider            etodolac (LODINE) 400 mg tablet  Take 400 mg by mouth two (2) times a day.  12/30/20      Provider, Historical     atorvastatin (LIPITOR) 40 mg tablet  Take 40 mg by mouth daily.  12/23/20      Provider, Historical     metoprolol succinate (TOPROL-XL) 25 mg XL tablet  Take 25 mg by mouth daily.  12/30/20      Provider, Historical     fluticasone-umeclidin-vilanter (TRELEGY ELLIPTA) 200-62.5-25 mcg inhaler  Trelegy Ellipta 200 mcg-62.5 mcg-25 mcg powder for inhalation    INHALE 1 PUFF BY MOUTH ONCE A DAY  08/19/20      Provider, Historical     albuterol (PROVENTIL HFA, VENTOLIN HFA, PROAIR HFA) 90 mcg/actuation inhaler  albuterol sulfate HFA 90 mcg/actuation aerosol inhaler    INHALE 2 PUFFS EVERY 6 HOURS AS NEEDED        Provider, Historical            omeprazole (PRILOSEC) 20 mg capsule  Take 20 mg by mouth daily.  08/11/20      Provider, Historical          Allergies        Allergen  Reactions         ?  Cheese  Nausea and Vomiting             Severe vomiting                      Review of  Systems:   Constitutional: Appetite is fair.  Denies weight loss, complains of fatigue.     Eye: No recent visual disturbances, no discharge, no double vision.   Ear/nose/mouth/throat : No hearing disturbance, no ear pain, + nasal congestion, no sore throat, no trouble swallowing.   Respiratory : No trouble breathing, ++ congestive nonproductive cough, ++ shortness of breath, no hemoptysis, no wheezing.   Cardiovascular : No chest pain, no palpitation,  no orthopnea, no peripheral edema.  Gastrointestinal : No nausea or vomiting.  .   Genitourinary : Has incontinence issues for a long time.  Wear diapers.   Endocrine : .  Immunologic :  No seasonal allergies.    Musculoskeletal : As in history of present illness.  Complains of significant back pain, gait disturbances and joint pains.     Integumentary : No rash, No pruritus,    Hematology : No petechiae, No easy bruising,  No tendency to bleed easy.   Neurology : Denies change in mental status, No headache, No  confusion, No numbness or tingling.   Psychiatric : Worried about her condition.     Vitals:     Patient Vitals for the past 12 hrs:            Temp  Pulse  Resp  BP  SpO2            01/02/21 2231  --  97  24  128/80  97 %            01/02/21 2216  --  (!) 101  24  132/77  94 %     01/02/21 2201  --  (!) 101  25  126/79  95 %     01/02/21 2146  --  (!) 106  25  117/80  94 %     01/02/21 1916  --  (!) 115  30  (!) 128/116  93 %     01/02/21 1853  --  (!) 124  (!) 31  133/82  95 %            01/02/21 1832  98.1 ??F (36.7 ??C)  (!) 129  26  131/87  93 %           Physical Exam:    General : Looks anxious, not in any distress.Marland Kitchen   HEENT : PERRLA, dry oral mucosa, atraumatic normocephalic, Normal ear and nose.   Neck : Supple, no JVD, no masses noted, no carotid bruit.   Lungs : Breath sounds with moderate air entry bilaterally, no wheezes or rales, no accessory muscle use.   CVS : Rhythm rate regular, S1+, S2+, no murmur or gallop.   Abdomen : Soft, nontender,  bowel  sounds active.   Extremities : No edema noted,  pedal pulses palpable.   Musculoskeletal : Fair range of motion, no joint swelling or effusion.   Skin : Dry, warm.  No pathological rash.   Lymphatic : No cervical lymphadenopathy.   Neurological : Awake, alert, oriented to time place person.     Psychiatric : Mood and affect appears appropriate to the situation.          Data Review:      Recent Results (from the past 24 hour(s))     METABOLIC PANEL, COMPREHENSIVE          Collection Time: 01/02/21  6:36 PM         Result  Value  Ref Range            Sodium  137  136 - 145 mmol/L       Potassium  4.3  3.5 - 5.1 mmol/L       Chloride  108  97 - 108 mmol/L       CO2  20 (L)  21 - 32 mmol/L       Anion gap  9  5 - 15 mmol/L       Glucose  134 (H)  65 - 100 mg/dL       BUN  22 (H)  6 - 20 mg/dL       Creatinine  0.85  0.55 - 1.02 mg/dL       BUN/Creatinine ratio  26 (H)  12 - 20         eGFR  >60  >60 ml/min/1.40m       Calcium  9.1  8.5 - 10.1 mg/dL       Bilirubin, total  0.4  0.2 - 1.0 mg/dL  AST (SGOT)  42 (H)  15 - 37 U/L       ALT (SGPT)  45  12 - 78 U/L       Alk. phosphatase  95  45 - 117 U/L       Protein, total  7.2  6.4 - 8.2 g/dL       Albumin  3.5  3.5 - 5.0 g/dL       Globulin  3.7  2.0 - 4.0 g/dL       A-G Ratio  0.9 (L)  1.1 - 2.2         CBC WITH AUTOMATED DIFF          Collection Time: 01/02/21  6:36 PM         Result  Value  Ref Range            WBC  22.5 (H)  3.6 - 11.0 K/uL            RBC  3.73 (L)  3.80 - 5.20 M/uL            HGB  11.9  11.5 - 16.0 g/dL       HCT  36.2  35.0 - 47.0 %       MCV  97.1  80.0 - 99.0 FL       MCH  31.9  26.0 - 34.0 PG       MCHC  32.9  30.0 - 36.5 g/dL       RDW  13.3  11.5 - 14.5 %       PLATELET  312  150 - 400 K/uL       MPV  8.8 (L)  8.9 - 12.9 FL       NRBC  0.0  0.0 PER 100 WBC       ABSOLUTE NRBC  0.00  0.00 - 0.01 K/uL       NEUTROPHILS  91 (H)  32 - 75 %       LYMPHOCYTES  4 (L)  12 - 49 %       MONOCYTES  4 (L)  5 - 13 %       EOSINOPHILS  0  0 - 7 %        BASOPHILS  0  0 - 1 %       IMMATURE GRANULOCYTES  1 (H)  0 - 0.5 %       ABS. NEUTROPHILS  20.5 (H)  1.8 - 8.0 K/UL       ABS. LYMPHOCYTES  0.8  0.8 - 3.5 K/UL       ABS. MONOCYTES  0.9  0.0 - 1.0 K/UL       ABS. EOSINOPHILS  0.1  0.0 - 0.4 K/UL       ABS. BASOPHILS  0.1  0.0 - 0.1 K/UL       ABS. IMM. GRANS.  0.2 (H)  0.00 - 0.04 K/UL       DF  AUTOMATED          TROPONIN-HIGH SENSITIVITY          Collection Time: 01/02/21  6:36 PM         Result  Value  Ref Range            Troponin-High Sensitivity  8  0 - 51 ng/L       NT-PRO BNP          Collection Time: 01/02/21  6:36  PM         Result  Value  Ref Range            NT pro-BNP  205 (H)  <125 pg/mL       LACTIC ACID          Collection Time: 01/02/21  7:26 PM         Result  Value  Ref Range            Lactic acid  1.0  0.4 - 2.0 mmol/L       URINALYSIS W/ REFLEX CULTURE          Collection Time: 01/02/21  9:44 PM       Specimen: Urine         Result  Value  Ref Range            Color  Yellow/Straw          Appearance  Clear  Clear         Specific gravity  1.015  1.003 - 1.030         pH (UA)  5.0          Protein  Negative  Negative mg/dL       Glucose  Negative  Negative mg/dL       Ketone  15 (A)  Negative mg/dL       Bilirubin  Negative  Negative         Blood  Negative  Negative         Urobilinogen  0.1 (L)  0.2 - 1.0 EU/dL       Nitrites  Negative  Negative         Leukocyte Esterase  Negative  Negative         UA:UC IF INDICATED  Culture not indicated by UA result  Culture not indicated by UA result         WBC  0-4  0 - 4 /hpf       RBC  0-5  0 - 5 /hpf       Bacteria  Negative  Negative /hpf            Mucus  Trace  /lpf           Radiologic Studies :      CT Results  (Last 48 hours)                                       01/02/21 1914    CT HEAD WO CONT  Final result            Impression:    Left frontal soft tissue swelling. No acute intracranial abnormality.                                     Narrative:    EXAM: CT HEAD WO CONT              INDICATION: fall             COMPARISON: 12/01/2020.             CONTRAST: None.             TECHNIQUE: Unenhanced CT of the head was performed using 5 mm images. Brain and  bone windows were generated. Coronal and sagittal reformats. CT dose reduction      was achieved through use of a standardized protocol tailored for this      examination and automatic exposure control for dose modulation.               FINDINGS:      The ventricles and sulci are normal in size, shape and configuration.. Small      vessel ischemic changes are stable compared to the prior exam. There is no      intracranial hemorrhage, extra-axial collection, or mass effect. The basilar      cisterns are open. No CT evidence of acute infarct.             The bone windows demonstrate no abnormalities. The visualized portions of the      paranasal sinuses and mastoid air cells are clear. Left frontal soft tissue      swelling is noted.                               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                       Narrative:    EXAM:  CT CERVICAL SPINE WITHOUT CONTRAST             INDICATION: fall.             COMPARISON: None.             CONTRAST:  None.             TECHNIQUE: Multislice helical CT of the cervical spine was performed without      intravenous contrast administration.  Sagittal and coronal reformats were      generated.  CT dose reduction was achieved through use of a standardized      protocol tailored for this examination and automatic exposure control for dose      modulation.              FINDINGS:             The alignment is within normal limits. There is no fracture or compression      deformity. The odontoid process is intact. The craniocervical junction is within      normal limits. Significant multilevel spondylosis is noted, greatest at C3-4,      C4-5, C5-C6. 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 Bilateral neural foraminal narrowing is also noted at these      levels secondary to uncovertebral osteophyte formation.                                      CXR Results  (Last 48 hours)                                       01/02/21 1831    XR CHEST PORT  Final result            Impression:  No acute cardiopulmonary process.                              Narrative:    EXAM:  XR CHEST PORT             INDICATION:   Tachycardia             COMPARISON: Chest radiographs from 11/27/2020 and 03/20/2020.             FINDINGS: AP radiograph of the chest was obtained.             No evidence of focal consolidation. Previously demonstrated ill-defined nodular      opacity in the left upper lung is not definitively seen on current study and      likely represent interval resolution versus artifactual. No pleural effusion or      pneumothorax.  Heart, hila, mediastinum are within normal limits.              No acute osseous abnormalities.                                            Assessment and Plan :        Sepsis syndrome: With leukocytosis with left shift and tachycardia meets the criteria.  But source is unclear at this point.  Started on ceftriaxone with suspected urinary tract infection, will continue and request consultation from infectious disease.   She is negative for work-up.    Gait disturbance: Recurrent falls, has similar issues in the past and admission needed.  Evaluation from PT and OT suggest  short step shuffling gait and unsteadiness.  She also complains of dizziness with ambulation.   Not sure if she has any orthostatics done last admission not, we will ask when she is stable on the floor.      Benign essential hypertension: On small dose of metoprolol succinate which we will continue with holding parameters.  It may help the heart rate.    Chronic obstructive pulmonary disease stable with inhalers she is using which we will continue,  no evidence of  exacerbation.    Recurrent falls with injuries: Seems to may have a problem with the autonomic nervous system, may benefit evaluation with neurology for her gait.  We can also suspect Parkinson's disease with shuffling gait and especially  with the dizziness.  She may not be safe to live alone, may need to find a long-term placement for and supervised care      Admitted to medical floor, full CODE STATUS, home medications reviewed and verified.  Has advanced medical directives on file with medical power of attorney.  Enoxaparin for VTE prophylaxis.         CC : Cioflec, Adela Ports, MD      Signed By:  Crawford Givens, MD           January 02, 2021         This dictation was done by dragon, computer voice recognition software.  Often unanticipated grammatical, syntax, Homer phones and other interpretive errors are inadvertently transcribed.  Please excuse errors that have escaped final proofreading.

## 2021-01-03 LAB — EKG, 12 LEAD, INITIAL
Atrial Rate: 126 {beats}/min
Calculated P Axis: 68 degrees
Calculated R Axis: 24 degrees
Calculated T Axis: 53 degrees
P-R Interval: 156 ms
Q-T Interval: 296 ms
QRS Duration: 62 ms
QTC Calculation (Bezet): 428 ms
Ventricular Rate: 126 {beats}/min

## 2021-01-03 LAB — RENAL FUNCTION PANEL
Albumin: 3 g/dL — ABNORMAL LOW (ref 3.5–5.0)
Albumin: 3 g/dL — ABNORMAL LOW (ref 3.5–5.0)
Anion Gap: 8 mmol/L (ref 5–15)
Anion gap: 8 mmol/L (ref 5–15)
BUN/Creatinine ratio: 20 (ref 12–20)
BUN: 15 mg/dL (ref 6–20)
BUN: 15 mg/dL (ref 6–20)
Bun/Cre Ratio: 20 (ref 12–20)
CO2: 23 mmol/L (ref 21–32)
CO2: 23 mmol/L (ref 21–32)
Calcium: 9 mg/dL (ref 8.5–10.1)
Calcium: 9 mg/dL (ref 8.5–10.1)
Chloride: 108 mmol/L (ref 97–108)
Chloride: 108 mmol/L (ref 97–108)
Creatinine: 0.76 mg/dL (ref 0.55–1.02)
Creatinine: 0.76 mg/dL (ref 0.55–1.02)
ESTIMATED GLOMERULAR FILTRATION RATE: >60 mL/min/{1.73_m2} (ref >60)
Glucose: 118 mg/dL — ABNORMAL HIGH (ref 65–100)
Glucose: 118 mg/dL — ABNORMAL HIGH (ref 65–100)
Phosphorus: 2.5 mg/dL — ABNORMAL LOW (ref 2.6–4.7)
Phosphorus: 2.5 mg/dL — ABNORMAL LOW (ref 2.6–4.7)
Potassium: 3.6 mmol/L (ref 3.5–5.1)
Potassium: 3.6 mmol/L (ref 3.5–5.1)
Sodium: 139 mmol/L (ref 136–145)
Sodium: 139 mmol/L (ref 136–145)
eGFR: >60 mL/min/{1.73_m2} (ref >60)

## 2021-01-03 LAB — CBC WITH AUTOMATED DIFF
ABS. BASOPHILS: 0 10*3/uL (ref 0.0–0.1)
ABS. BASOPHILS: 0.1 10*3/uL (ref 0.0–0.1)
ABS. EOSINOPHILS: 0.1 10*3/uL (ref 0.0–0.4)
ABS. EOSINOPHILS: 0.1 10*3/uL (ref 0.0–0.4)
ABS. IMM. GRANS.: 0.2 10*3/uL — ABNORMAL HIGH (ref 0.00–0.04)
ABS. IMM. GRANS.: 0.2 10*3/uL — ABNORMAL HIGH (ref 0.00–0.04)
ABS. LYMPHOCYTES: 0.8 10*3/uL (ref 0.8–3.5)
ABS. LYMPHOCYTES: 1.9 10*3/uL (ref 0.8–3.5)
ABS. MONOCYTES: 0.9 10*3/uL (ref 0.0–1.0)
ABS. MONOCYTES: 0.9 10*3/uL (ref 0.0–1.0)
ABS. NEUTROPHILS: 18.7 10*3/uL — ABNORMAL HIGH (ref 1.8–8.0)
ABS. NEUTROPHILS: 20.5 10*3/uL — ABNORMAL HIGH (ref 1.8–8.0)
ABSOLUTE NRBC: 0 10*3/uL (ref 0.00–0.01)
ABSOLUTE NRBC: 0 10*3/uL (ref 0.00–0.01)
BASOPHILS: 0 % (ref 0–1)
BASOPHILS: 0 % (ref 0–1)
EOSINOPHILS: 0 % (ref 0–7)
EOSINOPHILS: 1 % (ref 0–7)
HCT: 33.9 % — ABNORMAL LOW (ref 35.0–47.0)
HCT: 36.2 % (ref 35.0–47.0)
HGB: 10.8 g/dL — ABNORMAL LOW (ref 11.5–16.0)
HGB: 11.9 g/dL (ref 11.5–16.0)
IMMATURE GRANULOCYTES: 1 % — ABNORMAL HIGH (ref 0–0.5)
IMMATURE GRANULOCYTES: 1 % — ABNORMAL HIGH (ref 0–0.5)
LYMPHOCYTES: 4 % — ABNORMAL LOW (ref 12–49)
LYMPHOCYTES: 9 % — ABNORMAL LOW (ref 12–49)
MCH: 31.2 PG (ref 26.0–34.0)
MCH: 31.9 PG (ref 26.0–34.0)
MCHC: 31.9 g/dL (ref 30.0–36.5)
MCHC: 32.9 g/dL (ref 30.0–36.5)
MCV: 97.1 FL (ref 80.0–99.0)
MCV: 98 FL (ref 80.0–99.0)
MONOCYTES: 4 % — ABNORMAL LOW (ref 5–13)
MONOCYTES: 4 % — ABNORMAL LOW (ref 5–13)
MPV: 8.8 FL — ABNORMAL LOW (ref 8.9–12.9)
MPV: 8.9 FL (ref 8.9–12.9)
NEUTROPHILS: 85 % — ABNORMAL HIGH (ref 32–75)
NEUTROPHILS: 91 % — ABNORMAL HIGH (ref 32–75)
NRBC: 0 PER 100 WBC
NRBC: 0 PER 100 WBC
PLATELET: 279 10*3/uL (ref 150–400)
PLATELET: 312 10*3/uL (ref 150–400)
RBC: 3.46 M/uL — ABNORMAL LOW (ref 3.80–5.20)
RBC: 3.73 M/uL — ABNORMAL LOW (ref 3.80–5.20)
RDW: 13.3 % (ref 11.5–14.5)
RDW: 13.3 % (ref 11.5–14.5)
WBC: 21.8 10*3/uL — ABNORMAL HIGH (ref 3.6–11.0)
WBC: 22.5 10*3/uL — ABNORMAL HIGH (ref 3.6–11.0)

## 2021-01-03 LAB — TROPONIN-HIGH SENSITIVITY: Troponin-High Sensitivity: 8 ng/L (ref 0–51)

## 2021-01-03 LAB — URINALYSIS W/ REFLEX CULTURE
BACTERIA, URINE: NEGATIVE /hpf
Bacteria: NEGATIVE /hpf
Bilirubin, Urine: NEGATIVE
Bilirubin: NEGATIVE
Blood, Urine: NEGATIVE
Blood: NEGATIVE
Glucose, Ur: NEGATIVE mg/dL
Glucose: NEGATIVE mg/dL
Ketone: 15 mg/dL — AB
Ketones, Urine: 15 mg/dL — AB
Leukocyte Esterase, Urine: NEGATIVE
Leukocyte Esterase: NEGATIVE
Nitrite, Urine: NEGATIVE
Nitrites: NEGATIVE
Protein, UA: NEGATIVE mg/dL
Protein: NEGATIVE mg/dL
Specific Gravity, UA: 1.015 (ref 1.003–1.030)
Specific gravity: 1.015 (ref 1.003–1.030)
Urobilinogen, UA, POCT: 0.1 EU/dL — ABNORMAL LOW (ref 0.2–1.0)
Urobilinogen: 0.1 EU/dL — ABNORMAL LOW (ref 0.2–1.0)
pH (UA): 5
pH, UA: 5

## 2021-01-03 LAB — METABOLIC PANEL, COMPREHENSIVE
A-G Ratio: 0.9 — ABNORMAL LOW (ref 1.1–2.2)
ALT (SGPT): 45 U/L (ref 12–78)
AST (SGOT): 42 U/L — ABNORMAL HIGH (ref 15–37)
Albumin: 3.5 g/dL (ref 3.5–5.0)
Alk. phosphatase: 95 U/L (ref 45–117)
Anion gap: 9 mmol/L (ref 5–15)
BUN/Creatinine ratio: 26 — ABNORMAL HIGH (ref 12–20)
BUN: 22 mg/dL — ABNORMAL HIGH (ref 6–20)
Bilirubin, total: 0.4 mg/dL (ref 0.2–1.0)
CO2: 20 mmol/L — ABNORMAL LOW (ref 21–32)
Calcium: 9.1 mg/dL (ref 8.5–10.1)
Chloride: 108 mmol/L (ref 97–108)
Creatinine: 0.85 mg/dL (ref 0.55–1.02)
Globulin: 3.7 g/dL (ref 2.0–4.0)
Glucose: 134 mg/dL — ABNORMAL HIGH (ref 65–100)
Potassium: 4.3 mmol/L (ref 3.5–5.1)
Protein, total: 7.2 g/dL (ref 6.4–8.2)
Sodium: 137 mmol/L (ref 136–145)
eGFR: >60 mL/min/{1.73_m2} (ref >60)

## 2021-01-03 LAB — INFLUENZA A & B AG (RAPID TEST)
Influenza A Antigen: NEGATIVE
Influenza B Antigen: NEGATIVE

## 2021-01-03 LAB — LACTIC ACID
Lactic Acid: 1 mmol/L (ref 0.4–2.0)
Lactic acid: 1 mmol/L (ref 0.4–2.0)

## 2021-01-03 LAB — COVID-19 RAPID TEST: COVID-19 rapid test: NOT DETECTED

## 2021-01-03 LAB — NT-PRO BNP: NT pro-BNP: 205 pg/mL — ABNORMAL HIGH (ref <125)

## 2021-01-03 LAB — PROCALCITONIN
Procalcitonin: 0.35 ng/mL — ABNORMAL HIGH
Procalcitonin: 0.35 ng/mL — ABNORMAL HIGH

## 2021-01-03 LAB — CBC WITH AUTO DIFFERENTIAL
Basophils %: 0 % (ref 0–1)
Basophils %: 0 % (ref 0–1)
Basophils Absolute: 0 10*3/uL (ref 0.0–0.1)
Basophils Absolute: 0.1 10*3/uL (ref 0.0–0.1)
Eosinophils %: 0 % (ref 0–7)
Eosinophils %: 1 % (ref 0–7)
Eosinophils Absolute: 0.1 10*3/uL (ref 0.0–0.4)
Eosinophils Absolute: 0.1 10*3/uL (ref 0.0–0.4)
Granulocyte Absolute Count: 0.2 10*3/uL — ABNORMAL HIGH (ref 0.00–0.04)
Granulocyte Absolute Count: 0.2 10*3/uL — ABNORMAL HIGH (ref 0.00–0.04)
Hematocrit: 33.9 % — ABNORMAL LOW (ref 35.0–47.0)
Hematocrit: 36.2 % (ref 35.0–47.0)
Hemoglobin: 10.8 g/dL — ABNORMAL LOW (ref 11.5–16.0)
Hemoglobin: 11.9 g/dL (ref 11.5–16.0)
Immature Granulocytes: 1 % — ABNORMAL HIGH (ref 0–0.5)
Immature Granulocytes: 1 % — ABNORMAL HIGH (ref 0–0.5)
Lymphocytes %: 4 % — ABNORMAL LOW (ref 12–49)
Lymphocytes %: 9 % — ABNORMAL LOW (ref 12–49)
Lymphocytes Absolute: 0.8 10*3/uL (ref 0.8–3.5)
Lymphocytes Absolute: 1.9 10*3/uL (ref 0.8–3.5)
MCH: 31.2 PG (ref 26.0–34.0)
MCH: 31.9 PG (ref 26.0–34.0)
MCHC: 31.9 g/dL (ref 30.0–36.5)
MCHC: 32.9 g/dL (ref 30.0–36.5)
MCV: 97.1 FL (ref 80.0–99.0)
MCV: 98 FL (ref 80.0–99.0)
MPV: 8.8 FL — ABNORMAL LOW (ref 8.9–12.9)
MPV: 8.9 FL (ref 8.9–12.9)
Monocytes %: 4 % — ABNORMAL LOW (ref 5–13)
Monocytes %: 4 % — ABNORMAL LOW (ref 5–13)
Monocytes Absolute: 0.9 10*3/uL (ref 0.0–1.0)
Monocytes Absolute: 0.9 10*3/uL (ref 0.0–1.0)
NRBC Absolute: 0 10*3/uL (ref 0.00–0.01)
NRBC Absolute: 0 10*3/uL (ref 0.00–0.01)
Neutrophils %: 85 % — ABNORMAL HIGH (ref 32–75)
Neutrophils %: 91 % — ABNORMAL HIGH (ref 32–75)
Neutrophils Absolute: 18.7 10*3/uL — ABNORMAL HIGH (ref 1.8–8.0)
Neutrophils Absolute: 20.5 10*3/uL — ABNORMAL HIGH (ref 1.8–8.0)
Nucleated RBCs: 0 PER 100 WBC
Nucleated RBCs: 0 PER 100 WBC
Platelets: 279 10*3/uL (ref 150–400)
Platelets: 312 10*3/uL (ref 150–400)
RBC: 3.46 M/uL — ABNORMAL LOW (ref 3.80–5.20)
RBC: 3.73 M/uL — ABNORMAL LOW (ref 3.80–5.20)
RDW: 13.3 % (ref 11.5–14.5)
RDW: 13.3 % (ref 11.5–14.5)
WBC: 21.8 10*3/uL — ABNORMAL HIGH (ref 3.6–11.0)
WBC: 22.5 10*3/uL — ABNORMAL HIGH (ref 3.6–11.0)

## 2021-01-03 LAB — COMPREHENSIVE METABOLIC PANEL
ALT: 45 U/L (ref 12–78)
AST: 42 U/L — ABNORMAL HIGH (ref 15–37)
Albumin/Globulin Ratio: 0.9 — ABNORMAL LOW (ref 1.1–2.2)
Albumin: 3.5 g/dL (ref 3.5–5.0)
Alkaline Phosphatase: 95 U/L (ref 45–117)
Anion Gap: 9 mmol/L (ref 5–15)
BUN: 22 mg/dL — ABNORMAL HIGH (ref 6–20)
Bun/Cre Ratio: 26 — ABNORMAL HIGH (ref 12–20)
CO2: 20 mmol/L — ABNORMAL LOW (ref 21–32)
Calcium: 9.1 mg/dL (ref 8.5–10.1)
Chloride: 108 mmol/L (ref 97–108)
Creatinine: 0.85 mg/dL (ref 0.55–1.02)
ESTIMATED GLOMERULAR FILTRATION RATE: >60 mL/min/{1.73_m2} (ref >60)
Globulin: 3.7 g/dL (ref 2.0–4.0)
Glucose: 134 mg/dL — ABNORMAL HIGH (ref 65–100)
Potassium: 4.3 mmol/L (ref 3.5–5.1)
Sodium: 137 mmol/L (ref 136–145)
Total Bilirubin: 0.4 mg/dL (ref 0.2–1.0)
Total Protein: 7.2 g/dL (ref 6.4–8.2)

## 2021-01-03 LAB — EKG 12-LEAD
Atrial Rate: 126 {beats}/min
P Axis: 68 degrees
P-R Interval: 156 ms
Q-T Interval: 296 ms
QRS Duration: 62 ms
QTc Calculation (Bazett): 428 ms
R Axis: 24 degrees
T Axis: 53 degrees
Ventricular Rate: 126 {beats}/min

## 2021-01-03 LAB — RAPID INFLUENZA A/B ANTIGENS
Flu A Antigen: NEGATIVE
Influenza B Antigen: NEGATIVE

## 2021-01-03 LAB — TROPONIN, HIGH SENSITIVITY: Troponin, High Sensitivity: 8 ng/L (ref 0–51)

## 2021-01-03 LAB — COVID-19, RAPID: SARS-CoV-2, Rapid: NOT DETECTED

## 2021-01-03 LAB — PROBNP, N-TERMINAL: BNP: 205 pg/mL — ABNORMAL HIGH (ref <125)

## 2021-01-03 MED ORDER — SODIUM CHLORIDE 0.9 % IV PIGGY BACK
400 mg | Freq: Two times a day (BID) | INTRAVENOUS | Status: AC
Start: 2021-01-03 — End: 2021-01-04
  Administered 2021-01-03 – 2021-01-04 (×3): via INTRAVENOUS

## 2021-01-03 MED ORDER — ALBUTEROL SULFATE HFA 90 MCG/ACTUATION AEROSOL INHALER
90 mcg/actuation | RESPIRATORY_TRACT | Status: AC | PRN
Start: 2021-01-03 — End: 2021-01-10

## 2021-01-03 MED ORDER — METOPROLOL SUCCINATE SR 50 MG 24 HR TAB
50 mg | Freq: Every day | ORAL | Status: AC
Start: 2021-01-03 — End: 2021-01-10
  Administered 2021-01-03 – 2021-01-09 (×7): via ORAL

## 2021-01-03 MED ORDER — ONDANSETRON 4 MG TAB, RAPID DISSOLVE
4 mg | Freq: Four times a day (QID) | ORAL | Status: AC | PRN
Start: 2021-01-03 — End: 2021-01-10

## 2021-01-03 MED ORDER — ENOXAPARIN 30 MG/0.3 ML SUB-Q SYRINGE
300.3 mg/0.3 mL | Freq: Every day | SUBCUTANEOUS | Status: DC
Start: 2021-01-03 — End: 2021-01-10
  Administered 2021-01-03 – 2021-01-10 (×8): via SUBCUTANEOUS

## 2021-01-03 MED ORDER — SODIUM CHLORIDE 0.9 % IJ SYRG
INTRAMUSCULAR | Status: AC | PRN
Start: 2021-01-03 — End: 2021-01-10

## 2021-01-03 MED ORDER — ACETAMINOPHEN 325 MG TABLET
325 mg | Freq: Four times a day (QID) | ORAL | Status: AC | PRN
Start: 2021-01-03 — End: 2021-01-10
  Administered 2021-01-05 – 2021-01-10 (×8): via ORAL

## 2021-01-03 MED ORDER — ACETAMINOPHEN 650 MG RECTAL SUPPOSITORY
650 mg | Freq: Four times a day (QID) | RECTAL | Status: AC | PRN
Start: 2021-01-03 — End: 2021-01-04

## 2021-01-03 MED ORDER — ACETAMINOPHEN 325 MG TABLET
325 mg | ORAL | Status: AC
Start: 2021-01-03 — End: 2021-01-02
  Administered 2021-01-03: via ORAL

## 2021-01-03 MED ORDER — SODIUM CHLORIDE 0.9 % IJ SYRG
Freq: Three times a day (TID) | INTRAMUSCULAR | Status: DC
Start: 2021-01-03 — End: 2021-01-10
  Administered 2021-01-03 – 2021-01-10 (×23): via INTRAVENOUS

## 2021-01-03 MED ORDER — BUDESONIDE-FORMOTEROL HFA 160 MCG-4.5 MCG/ACTUATION AEROSOL INHALER
Freq: Two times a day (BID) | RESPIRATORY_TRACT | Status: AC
Start: 2021-01-03 — End: 2021-01-10
  Administered 2021-01-03 – 2021-01-10 (×15): via RESPIRATORY_TRACT

## 2021-01-03 MED ORDER — ATORVASTATIN 40 MG TAB
40 mg | Freq: Every day | ORAL | Status: AC
Start: 2021-01-03 — End: 2021-01-10
  Administered 2021-01-03 – 2021-01-10 (×8): via ORAL

## 2021-01-03 MED ORDER — ENOXAPARIN 40 MG/0.4 ML SUB-Q SYRINGE
400.4 mg/0.4 mL | Freq: Every day | SUBCUTANEOUS | Status: DC
Start: 2021-01-03 — End: 2021-01-03

## 2021-01-03 MED ORDER — PANTOPRAZOLE 40 MG TAB, DELAYED RELEASE
40 mg | Freq: Every day | ORAL | Status: AC
Start: 2021-01-03 — End: 2021-01-10
  Administered 2021-01-03 – 2021-01-10 (×8): via ORAL

## 2021-01-03 MED ORDER — ONDANSETRON (PF) 4 MG/2 ML INJECTION
4 mg/2 mL | Freq: Four times a day (QID) | INTRAMUSCULAR | Status: AC | PRN
Start: 2021-01-03 — End: 2021-01-10

## 2021-01-03 MED ORDER — SODIUM CHLORIDE 0.9 % IJ SYRG
INTRAMUSCULAR | Status: AC | PRN
Start: 2021-01-03 — End: 2021-01-10
  Administered 2021-01-03: 01:00:00 via INTRAVENOUS

## 2021-01-03 MED ORDER — WATER FOR INJECTION, STERILE INJECTION
1 gram | INTRAMUSCULAR | Status: AC
Start: 2021-01-03 — End: 2021-01-03
  Administered 2021-01-03: 01:00:00 via INTRAVENOUS

## 2021-01-03 MED ORDER — TIOTROPIUM BROMIDE 2.5 MCG/ACTUATION MIST FOR INHALATION
2.5 mcg/actuation | Freq: Every day | RESPIRATORY_TRACT | Status: AC
Start: 2021-01-03 — End: 2021-01-10
  Administered 2021-01-03 – 2021-01-10 (×7): via RESPIRATORY_TRACT

## 2021-01-03 MED FILL — METOPROLOL SUCCINATE SR 25 MG 24 HR TAB: 25 mg | ORAL | Qty: 1

## 2021-01-03 MED FILL — SODIUM CHLORIDE 0.9 % IJ SYRG: INTRAMUSCULAR | Qty: 10

## 2021-01-03 MED FILL — TEFLARO 400 MG INTRAVENOUS SOLUTION: 400 mg | INTRAVENOUS | Qty: 400

## 2021-01-03 MED FILL — SYMBICORT 160 MCG-4.5 MCG/ACTUATION HFA AEROSOL INHALER: RESPIRATORY_TRACT | Qty: 6

## 2021-01-03 MED FILL — TYLENOL 325 MG TABLET: 325 mg | ORAL | Qty: 2

## 2021-01-03 MED FILL — SODIUM CHLORIDE 0.9 % IJ SYRG: INTRAMUSCULAR | Qty: 40

## 2021-01-03 MED FILL — ATORVASTATIN 40 MG TAB: 40 mg | ORAL | Qty: 1

## 2021-01-03 MED FILL — ENOXAPARIN 30 MG/0.3 ML SUB-Q SYRINGE: 30 mg/0.3 mL | SUBCUTANEOUS | Qty: 0.3

## 2021-01-03 MED FILL — PANTOPRAZOLE 40 MG TAB, DELAYED RELEASE: 40 mg | ORAL | Qty: 1

## 2021-01-03 MED FILL — CEFTRIAXONE 1 GRAM SOLUTION FOR INJECTION: 1 gram | INTRAMUSCULAR | Qty: 1

## 2021-01-03 MED FILL — SPIRIVA RESPIMAT 2.5 MCG/ACTUATION SOLUTION FOR INHALATION: 2.5 mcg/actuation | RESPIRATORY_TRACT | Qty: 4

## 2021-01-03 NOTE — ED Notes (Signed)
Assumed care of patient and received report from [facility] RN. Pt resting with call bell in reach.

## 2021-01-03 NOTE — Consults (Signed)
Consults by  Vallarie Mare, MD at 01/03/21 1801                Author: Vallarie Mare, MD  Service: --  Author Type: Physician       Filed: 01/03/21 2257  Date of Service: 01/03/21 1801  Status: Signed          Editor: Vallarie Mare, MD (Physician)                  Consult Date: 01/03/2021      Consults  Leukocytosis with left shift, source of infection unclear      Subjective  This is a 75 year old female brought to ED by EMS because of frequent falls.  She was afebrile but tachycardic and tachypneic. Exam remarkable facial bruises and paraspinal tenderness.  Her WBC was 22,500 with mildly elevated procal.  UA was  unremarkable.  Rapid flu tests and Covid-19 were negative. CXR was unremarkable.  CT Head showed left frontal soft tissue swelling but no acute intracranial abnormality. CT cervical spine showed multilevel spondylosis. Images reviewed by me.  Blood and  urine cultures sent.  ID has been consulted for this reason.    Patient resting comfortably with no complaints but admits to frequent falls at home with dizziness.         Past Medical History:        Diagnosis  Date         ?  Chronic obstructive pulmonary disease (HCC)       ?  Hypercholesteremia       ?  Insomnia           ?  Sleep disorder           History reviewed. No pertinent surgical history.   No family history on file.      Social History          Tobacco Use         ?  Smoking status:  Former              Packs/day:  1.00         Types:  Cigarettes         Quit date:  2021         Years since quitting:  1.9         Passive exposure:  Past         ?  Smokeless tobacco:  Never       Substance Use Topics         ?  Alcohol use:  Yes              Alcohol/week:  1.0 standard drink         Types:  1 Cans of beer per week             Comment: 1 can of beer every 1-2 weeks            Current Facility-Administered Medications             Medication  Dose  Route  Frequency  Provider  Last Rate  Last Admin              ?  enoxaparin  (LOVENOX) injection 30 mg   30 mg  SubCUTAneous  DAILY  Garnette Gunner, Sakuntla, MD           ?  sodium chloride (NS) flush 5-10 mL   5-10 mL  IntraVENous  PRN  Calcagni, Raelyn Ensign, MD     10 mL at 01/02/21 1930              ?  cefTRIAXone (ROCEPHIN) 1 g in sterile water (preservative free) 10 mL IV syringe   1 g  IntraVENous  Q24H  Calcagni, Raelyn Ensign, MD     1 g at 01/02/21 1930              ?  albuterol (PROVENTIL HFA, VENTOLIN HFA, PROAIR HFA) inhaler 2 Puff   2 Puff  Inhalation  Q4H PRN  Crawford Givens, MD           ?  pantoprazole (PROTONIX) tablet 40 mg   40 mg  Oral  DAILY  Crawford Givens, MD           ?  atorvastatin (LIPITOR) tablet 40 mg   40 mg  Oral  DAILY  Crawford Givens, MD           ?  metoprolol succinate (TOPROL-XL) XL tablet 25 mg   25 mg  Oral  DAILY  Crawford Givens, MD           ?  budesonide-formoteroL (SYMBICORT) 160-4.5 mcg/actuation HFA inhaler 2 Puff   2 Puff  Inhalation  BID RT  Crawford Givens, MD     2 Puff at 01/03/21 0605          And              ?  tiotropium bromide (SPIRIVA RESPIMAT) 2.5 mcg /actuation   2 Puff  Inhalation  DAILY  Crawford Givens, MD     2 Puff at 01/03/21 0809     ?  sodium chloride (NS) flush 5-40 mL   5-40 mL  IntraVENous  Q8H  Crawford Givens, MD           ?  sodium chloride (NS) flush 5-40 mL   5-40 mL  IntraVENous  PRN  Crawford Givens, MD           ?  acetaminophen (TYLENOL) tablet 650 mg   650 mg  Oral  Q6H PRN  Crawford Givens, MD                Or              ?  acetaminophen (TYLENOL) suppository 650 mg   650 mg  Rectal  Q6H PRN  Crawford Givens, MD           ?  ondansetron (ZOFRAN ODT) tablet 4 mg   4 mg  Oral  Q6H PRN  Crawford Givens, MD                Or              ?  ondansetron (ZOFRAN) injection 4 mg   4 mg  IntraVENous  Q6H PRN  Crawford Givens, MD                Current Outpatient Medications          Medication  Sig  Dispense  Refill           ?  etodolac (LODINE) 400 mg  tablet  Take 400 mg by mouth two (2) times a day.               ?  atorvastatin (LIPITOR) 40 mg tablet  Take 40 mg by mouth daily.         ?  metoprolol  succinate (TOPROL-XL) 25 mg XL tablet  Take 25 mg by mouth daily.         ?  fluticasone-umeclidin-vilanter (TRELEGY ELLIPTA) 200-62.5-25 mcg inhaler  Trelegy Ellipta 200 mcg-62.5 mcg-25 mcg powder for inhalation    INHALE 1 PUFF BY MOUTH ONCE A DAY         ?  albuterol (PROVENTIL HFA, VENTOLIN HFA, PROAIR HFA) 90 mcg/actuation inhaler  albuterol sulfate HFA 90 mcg/actuation aerosol inhaler    INHALE 2 PUFFS EVERY 6 HOURS AS NEEDED               ?  omeprazole (PRILOSEC) 20 mg capsule  Take 20 mg by mouth daily.                Review of Systems    Constitutional: Negative.     HENT: Negative.      Eyes: Negative.     Respiratory: Negative.      Cardiovascular: Negative.     Endocrine: Negative.     Genitourinary: Negative.     Musculoskeletal: Negative.     Skin: Negative.     Allergic/Immunologic: Negative.     Neurological: Negative.     Hematological: Negative.     Psychiatric/Behavioral: Negative.         Objective       Vital signs for last 24 hours:   Visit Vitals      BP  123/65 (BP 1 Location: Left upper arm, BP Patient Position: At rest)     Pulse  93     Temp  98.2 ??F (36.8 ??C)     Resp  26     Ht  5' (1.524 m)     Wt  100 lb (45.4 kg)     SpO2  95%        BMI  19.53 kg/m??           Intake/Output this shift:   Current Shift: No intake/output data recorded.   Last 3 Shifts: No intake/output data recorded.      Data Review:      Recent Results (from the past 24 hour(s))     EKG, 12 LEAD, INITIAL          Collection Time: 01/02/21  6:29 PM         Result  Value  Ref Range            Ventricular Rate  126  BPM       Atrial Rate  126  BPM       P-R Interval  156  ms       QRS Duration  62  ms       Q-T Interval  296  ms       QTC Calculation (Bezet)  428  ms       Calculated P Axis  68  degrees       Calculated R Axis  24  degrees       Calculated T Axis  53   degrees       Diagnosis                 Sinus tachycardia   Nonspecific ST abnormality   Abnormal ECG   No previous ECGs available   Confirmed by AGYEMAN, KWABENA ([id number]) on 01/03/2021 6:33:56 AM          METABOLIC PANEL, COMPREHENSIVE          Collection Time: 01/02/21  6:36  PM         Result  Value  Ref Range            Sodium  137  136 - 145 mmol/L       Potassium  4.3  3.5 - 5.1 mmol/L       Chloride  108  97 - 108 mmol/L       CO2  20 (L)  21 - 32 mmol/L       Anion gap  9  5 - 15 mmol/L       Glucose  134 (H)  65 - 100 mg/dL       BUN  22 (H)  6 - 20 mg/dL       Creatinine  0.85  0.55 - 1.02 mg/dL       BUN/Creatinine ratio  26 (H)  12 - 20         eGFR  >60  >60 ml/min/1.28m       Calcium  9.1  8.5 - 10.1 mg/dL       Bilirubin, total  0.4  0.2 - 1.0 mg/dL       AST (SGOT)  42 (H)  15 - 37 U/L       ALT (SGPT)  45  12 - 78 U/L       Alk. phosphatase  95  45 - 117 U/L       Protein, total  7.2  6.4 - 8.2 g/dL       Albumin  3.5  3.5 - 5.0 g/dL       Globulin  3.7  2.0 - 4.0 g/dL       A-G Ratio  0.9 (L)  1.1 - 2.2         CBC WITH AUTOMATED DIFF          Collection Time: 01/02/21  6:36 PM         Result  Value  Ref Range            WBC  22.5 (H)  3.6 - 11.0 K/uL       RBC  3.73 (L)  3.80 - 5.20 M/uL       HGB  11.9  11.5 - 16.0 g/dL       HCT  36.2  35.0 - 47.0 %       MCV  97.1  80.0 - 99.0 FL       MCH  31.9  26.0 - 34.0 PG       MCHC  32.9  30.0 - 36.5 g/dL       RDW  13.3  11.5 - 14.5 %       PLATELET  312  150 - 400 K/uL       MPV  8.8 (L)  8.9 - 12.9 FL       NRBC  0.0  0.0 PER 100 WBC       ABSOLUTE NRBC  0.00  0.00 - 0.01 K/uL       NEUTROPHILS  91 (H)  32 - 75 %       LYMPHOCYTES  4 (L)  12 - 49 %       MONOCYTES  4 (L)  5 - 13 %       EOSINOPHILS  0  0 - 7 %       BASOPHILS  0  0 - 1 %       IMMATURE GRANULOCYTES  1 (H)  0 - 0.5 %       ABS. NEUTROPHILS  20.5 (H)  1.8 - 8.0 K/UL       ABS. LYMPHOCYTES  0.8  0.8 - 3.5 K/UL       ABS. MONOCYTES  0.9  0.0 - 1.0 K/UL       ABS. EOSINOPHILS  0.1  0.0 - 0.4  K/UL       ABS. BASOPHILS  0.1  0.0 - 0.1 K/UL       ABS. IMM. GRANS.  0.2 (H)  0.00 - 0.04 K/UL       DF  AUTOMATED          TROPONIN-HIGH SENSITIVITY          Collection Time: 01/02/21  6:36 PM         Result  Value  Ref Range            Troponin-High Sensitivity  8  0 - 51 ng/L       NT-PRO BNP          Collection Time: 01/02/21  6:36 PM         Result  Value  Ref Range            NT pro-BNP  205 (H)  <125 pg/mL       LACTIC ACID          Collection Time: 01/02/21  7:26 PM         Result  Value  Ref Range            Lactic acid  1.0  0.4 - 2.0 mmol/L       URINALYSIS W/ REFLEX CULTURE          Collection Time: 01/02/21  9:44 PM       Specimen: Urine         Result  Value  Ref Range            Color  Yellow/Straw          Appearance  Clear  Clear         Specific gravity  1.015  1.003 - 1.030         pH (UA)  5.0          Protein  Negative  Negative mg/dL       Glucose  Negative  Negative mg/dL       Ketone  15 (A)  Negative mg/dL       Bilirubin  Negative  Negative         Blood  Negative  Negative         Urobilinogen  0.1 (L)  0.2 - 1.0 EU/dL       Nitrites  Negative  Negative         Leukocyte Esterase  Negative  Negative         UA:UC IF INDICATED  Culture not indicated by UA result  Culture not indicated by UA result         WBC  0-4  0 - 4 /hpf       RBC  0-5  0 - 5 /hpf       Bacteria  Negative  Negative /hpf       Mucus  Trace  /lpf       COVID-19 RAPID TEST          Collection Time: 01/02/21 11:32 PM         Result  Value  Ref Range            COVID-19 rapid test  Not Detected  Not Detected         INFLUENZA A & B AG (RAPID TEST)          Collection Time: 01/02/21 11:32 PM         Result  Value  Ref Range            Influenza A Antigen  Negative  Negative         Influenza B Antigen  Negative  Negative         CBC WITH AUTOMATED DIFF          Collection Time: 01/03/21  4:08 AM         Result  Value  Ref Range            WBC  21.8 (H)  3.6 - 11.0 K/uL       RBC  3.46 (L)  3.80 - 5.20 M/uL       HGB  10.8  (L)  11.5 - 16.0 g/dL       HCT  33.9 (L)  35.0 - 47.0 %       MCV  98.0  80.0 - 99.0 FL       MCH  31.2  26.0 - 34.0 PG       MCHC  31.9  30.0 - 36.5 g/dL       RDW  13.3  11.5 - 14.5 %       PLATELET  279  150 - 400 K/uL       MPV  8.9  8.9 - 12.9 FL       NRBC  0.0  0.0 PER 100 WBC       ABSOLUTE NRBC  0.00  0.00 - 0.01 K/uL       NEUTROPHILS  85 (H)  32 - 75 %       LYMPHOCYTES  9 (L)  12 - 49 %       MONOCYTES  4 (L)  5 - 13 %       EOSINOPHILS  1  0 - 7 %       BASOPHILS  0  0 - 1 %       IMMATURE GRANULOCYTES  1 (H)  0 - 0.5 %       ABS. NEUTROPHILS  18.7 (H)  1.8 - 8.0 K/UL       ABS. LYMPHOCYTES  1.9  0.8 - 3.5 K/UL       ABS. MONOCYTES  0.9  0.0 - 1.0 K/UL       ABS. EOSINOPHILS  0.1  0.0 - 0.4 K/UL       ABS. BASOPHILS  0.0  0.0 - 0.1 K/UL       ABS. IMM. GRANS.  0.2 (H)  0.00 - 0.04 K/UL       DF  AUTOMATED          RENAL FUNCTION PANEL          Collection Time: 01/03/21  4:08 AM         Result  Value  Ref Range            Sodium  139  136 - 145 mmol/L       Potassium  3.6  3.5 - 5.1 mmol/L       Chloride  108  97 - 108 mmol/L  CO2  23  21 - 32 mmol/L       Anion gap  8  5 - 15 mmol/L       Glucose  118 (H)  65 - 100 mg/dL       BUN  15  6 - 20 mg/dL       Creatinine  0.76  0.55 - 1.02 mg/dL       BUN/Creatinine ratio  20  12 - 20         eGFR  >60  >60 ml/min/1.57m       Calcium  9.0  8.5 - 10.1 mg/dL       Phosphorus  2.5 (L)  2.6 - 4.7 mg/dL       Albumin  3.0 (L)  3.5 - 5.0 g/dL       PROCALCITONIN          Collection Time: 01/03/21  4:08 AM         Result  Value  Ref Range            Procalcitonin  0.35 (H)  0 ng/mL        CXR (12/11)               CT Head (12/11)              Physical Exam   Vitals and nursing note reviewed.    Constitutional:        Appearance: She is not ill-appearing.    HENT:       Head: Normocephalic and atraumatic.            Comments: Flesh colored swelling left forehead, nontender, no erythema, no ecchymoses      Right Ear: External ear normal.       Left Ear:  External ear normal.       Mouth/Throat:       Mouth: Mucous membranes are moist.    Eyes:       Pupils: Pupils are equal, round, and reactive to light.     Cardiovascular:       Rate and Rhythm: Normal rate and regular rhythm.    Pulmonary:       Effort: Pulmonary effort is normal.       Breath sounds: Normal breath sounds.     Abdominal:       General: Bowel sounds are normal. There is no distension.       Palpations: Abdomen is soft.       Tenderness: There is no abdominal tenderness.     Musculoskeletal:       Cervical back: Neck supple.       Right lower leg: No edema.       Left lower leg: No edema.    Skin:      Findings: No lesion or rash.    Neurological:       General: No focal deficit present.       Mental Status: She is alert and oriented to person, place, and time.    Psychiatric:          Behavior: Behavior normal.       ASSESSMENT/PLAN      1.  Leukocytosis, neutrophilic, etiology unclear   2.   Mildly elevated procalcitonin   3.   Soft tissue swelling left forehead, no signs of infection   4.  Frequent falls with dizziness      Comment: Inarguably significant leukocytosis with normal WBC last month.  Agree that there is no clear source of infection.  Would favor broad spectrum empiric coverage pending culture results.      1.  Ceftaroline empirically for MRSA and Gram negative coverage   2.  In am, repeat CBC, procal, check CRP   3.  Follow-up blood and urine cultures      Kourtney Montesinos A. Trudie Reed, MD

## 2021-01-03 NOTE — ED Notes (Signed)
 TRANSFER - OUT REPORT:    Verbal report given to Martin(name) on [name]  being transferred to [facility] east(unit) for routine progression of care       Report consisted of patient's Situation, Background, Assessment and   Recommendations(SBAR).     Information from the following report(s) SBAR was reviewed with the receiving nurse.    Lines:   Peripheral IV 01/02/21 Anterior;Distal;Right Forearm (Active)        Opportunity for questions and clarification was provided.      Patient transported with:   [employer]

## 2021-01-03 NOTE — Consults (Signed)
Consult Date: 01/03/2021    IP CONSULT TO NEUROLOGY  Consult performed by: Gabrielle Dare, MD  Consult ordered by: Crawford Givens, MD      [name] is a 75 year old woman with past medical history of COPD, active smoker who has been having severe dizziness for at least the past few days but she cannot recall exactly when it started.  She ambulates with a walker and has been using this walker over the past several months.  She says that she is having severe dizziness which is causing her to fall.  She recalls yesterday she was setting up in the kitchen and as she was trying to get out of the kitchen to walk in the hallway she was felt very very dizzy.  She denies any room spinning.  She cannot give any further history.  Denies any focal symptoms numbness tingling or weakness.  CT scan of the head was unrevealing.  Labs show anemia.    Subjective     Past Medical History:   Diagnosis Date    Chronic obstructive pulmonary disease ([place])     Hypercholesteremia     Insomnia     Sleep disorder       History reviewed. No pertinent surgical history.  No family history on file.   Social History     Tobacco Use    Smoking status: Former     Packs/day: 1.00     Types: Cigarettes     Quit date: 2021     Years since quitting: 1.9     Passive exposure: Past    Smokeless tobacco: Never   Substance Use Topics    Alcohol use: Yes     Alcohol/week: 1.0 standard drink     Types: 1 Cans of beer per week     Comment: 1 can of beer every 1-2 weeks       Current Facility-Administered Medications   Medication Dose Route Frequency Provider Last Rate Last Admin    enoxaparin (LOVENOX) injection 30 mg  30 mg SubCUTAneous DAILY Garnette Gunner, [place], MD        sodium chloride (NS) flush 5-10 mL  5-10 mL IntraVENous PRN Calcagni, Raelyn Ensign, MD   10 mL at 01/02/21 1930    cefTRIAXone (ROCEPHIN) 1 g in sterile water (preservative free) 10 mL IV syringe  1 g IntraVENous Q24H Calcagni, Raelyn Ensign, MD   1 g at 01/02/21 1930    albuterol  (PROVENTIL HFA, VENTOLIN HFA, PROAIR HFA) inhaler 2 Puff  2 Puff Inhalation Q4H PRN Crawford Givens, MD        pantoprazole (PROTONIX) tablet 40 mg  40 mg Oral DAILY Crawford Givens, MD        atorvastatin (LIPITOR) tablet 40 mg  40 mg Oral DAILY Crawford Givens, MD        metoprolol succinate (TOPROL-XL) XL tablet 25 mg  25 mg Oral DAILY Crawford Givens, MD        budesonide-formoteroL (SYMBICORT) 160-4.5 mcg/actuation HFA inhaler 2 Puff  2 Puff Inhalation BID RT Crawford Givens, MD   2 Puff at 01/03/21 2536    And    tiotropium bromide (SPIRIVA RESPIMAT) 2.5 mcg /actuation  2 Puff Inhalation DAILY Crawford Givens, MD   2 Puff at 01/03/21 0809    sodium chloride (NS) flush 5-40 mL  5-40 mL IntraVENous Q8H Crawford Givens, MD        sodium chloride (NS) flush 5-40 mL  5-40 mL IntraVENous PRN Crawford Givens, MD  acetaminophen (TYLENOL) tablet 650 mg  650 mg Oral Q6H PRN Crawford Givens, MD        Or    acetaminophen (TYLENOL) suppository 650 mg  650 mg Rectal Q6H PRN Crawford Givens, MD        ondansetron (ZOFRAN ODT) tablet 4 mg  4 mg Oral Q6H PRN Crawford Givens, MD        Or    ondansetron (ZOFRAN) injection 4 mg  4 mg IntraVENous Q6H PRN Crawford Givens, MD         Current Outpatient Medications   Medication Sig Dispense Refill    etodolac (LODINE) 400 mg tablet Take 400 mg by mouth two (2) times a day.      atorvastatin (LIPITOR) 40 mg tablet Take 40 mg by mouth daily.      metoprolol succinate (TOPROL-XL) 25 mg XL tablet Take 25 mg by mouth daily.      fluticasone-umeclidin-vilanter (TRELEGY ELLIPTA) 200-62.5-25 mcg inhaler Trelegy Ellipta 200 mcg-62.5 mcg-25 mcg powder for inhalation   INHALE 1 PUFF BY MOUTH ONCE A DAY      albuterol (PROVENTIL HFA, VENTOLIN HFA, PROAIR HFA) 90 mcg/actuation inhaler albuterol sulfate HFA 90 mcg/actuation aerosol inhaler   INHALE 2 PUFFS EVERY 6 HOURS AS NEEDED      omeprazole (PRILOSEC) 20 mg  capsule Take 20 mg by mouth daily.          Review of Systems   Neurological:  Positive for dizziness.   All other systems reviewed and are negative.    Objective     Vital signs for last 24 hours:  Visit Vitals  BP 123/65 (BP 1 Location: Left upper arm, BP Patient Position: At rest)   Pulse 93   Temp 98.2 ??F (36.8 ??C)   Resp 26   Ht 5' (1.524 m)   Wt 45.4 kg (100 lb)   SpO2 95%   BMI 19.53 kg/m??       Intake/Output this shift:  Current Shift: No intake/output data recorded.  Last 3 Shifts: No intake/output data recorded.    Data Review:   Recent Results (from the past 24 hour(s))   EKG, 12 LEAD, INITIAL    Collection Time: 01/02/21  6:29 PM   Result Value Ref Range    Ventricular Rate 126 BPM    Atrial Rate 126 BPM    P-R Interval 156 ms    QRS Duration 62 ms    Q-T Interval 296 ms    QTC Calculation (Bezet) 428 ms    Calculated P Axis 68 degrees    Calculated R Axis 24 degrees    Calculated T Axis 53 degrees    Diagnosis       Sinus tachycardia  Nonspecific ST abnormality  Abnormal ECG  No previous ECGs available  Confirmed by Geraldine Solar, KWABENA ([id number]) on 01/03/2021 6:44:03 AM     METABOLIC PANEL, COMPREHENSIVE    Collection Time: 01/02/21  6:36 PM   Result Value Ref Range    Sodium 137 136 - 145 mmol/L    Potassium 4.3 3.5 - 5.1 mmol/L    Chloride 108 97 - 108 mmol/L    CO2 20 (L) 21 - 32 mmol/L    Anion gap 9 5 - 15 mmol/L    Glucose 134 (H) 65 - 100 mg/dL    BUN 22 (H) 6 - 20 mg/dL    Creatinine 0.85 0.55 - 1.02 mg/dL    BUN/Creatinine ratio 26 (H) 12 - 20  eGFR >60 >60 ml/min/1.16m    Calcium 9.1 8.5 - 10.1 mg/dL    Bilirubin, total 0.4 0.2 - 1.0 mg/dL    AST (SGOT) 42 (H) 15 - 37 U/L    ALT (SGPT) 45 12 - 78 U/L    Alk. phosphatase 95 45 - 117 U/L    Protein, total 7.2 6.4 - 8.2 g/dL    Albumin 3.5 3.5 - 5.0 g/dL    Globulin 3.7 2.0 - 4.0 g/dL    A-G Ratio 0.9 (L) 1.1 - 2.2     CBC WITH AUTOMATED DIFF    Collection Time: 01/02/21  6:36 PM   Result Value Ref Range    WBC 22.5 (H) 3.6 - 11.0 K/uL    RBC 3.73  (L) 3.80 - 5.20 M/uL    HGB 11.9 11.5 - 16.0 g/dL    HCT 36.2 35.0 - 47.0 %    MCV 97.1 80.0 - 99.0 FL    MCH 31.9 26.0 - 34.0 PG    MCHC 32.9 30.0 - 36.5 g/dL    RDW 13.3 11.5 - 14.5 %    PLATELET 312 150 - 400 K/uL    MPV 8.8 (L) 8.9 - 12.9 FL    NRBC 0.0 0.0 PER 100 WBC    ABSOLUTE NRBC 0.00 0.00 - 0.01 K/uL    NEUTROPHILS 91 (H) 32 - 75 %    LYMPHOCYTES 4 (L) 12 - 49 %    MONOCYTES 4 (L) 5 - 13 %    EOSINOPHILS 0 0 - 7 %    BASOPHILS 0 0 - 1 %    IMMATURE GRANULOCYTES 1 (H) 0 - 0.5 %    ABS. NEUTROPHILS 20.5 (H) 1.8 - 8.0 K/UL    ABS. LYMPHOCYTES 0.8 0.8 - 3.5 K/UL    ABS. MONOCYTES 0.9 0.0 - 1.0 K/UL    ABS. EOSINOPHILS 0.1 0.0 - 0.4 K/UL    ABS. BASOPHILS 0.1 0.0 - 0.1 K/UL    ABS. IMM. GRANS. 0.2 (H) 0.00 - 0.04 K/UL    DF AUTOMATED     TROPONIN-HIGH SENSITIVITY    Collection Time: 01/02/21  6:36 PM   Result Value Ref Range    Troponin-High Sensitivity 8 0 - 51 ng/L   NT-PRO BNP    Collection Time: 01/02/21  6:36 PM   Result Value Ref Range    NT pro-BNP 205 (H) <125 pg/mL   LACTIC ACID    Collection Time: 01/02/21  7:26 PM   Result Value Ref Range    Lactic acid 1.0 0.4 - 2.0 mmol/L   URINALYSIS W/ REFLEX CULTURE    Collection Time: 01/02/21  9:44 PM    Specimen: Urine   Result Value Ref Range    Color Yellow/Straw      Appearance Clear Clear      Specific gravity 1.015 1.003 - 1.030      pH (UA) 5.0      Protein Negative Negative mg/dL    Glucose Negative Negative mg/dL    Ketone 15 (A) Negative mg/dL    Bilirubin Negative Negative      Blood Negative Negative      Urobilinogen 0.1 (L) 0.2 - 1.0 EU/dL    Nitrites Negative Negative      Leukocyte Esterase Negative Negative      UA:UC IF INDICATED Culture not indicated by UA result Culture not indicated by UA result      WBC 0-4 0 - 4 /hpf  RBC 0-5 0 - 5 /hpf    Bacteria Negative Negative /hpf    Mucus Trace /lpf   COVID-19 RAPID TEST    Collection Time: 01/02/21 11:32 PM   Result Value Ref Range    COVID-19 rapid test Not Detected Not Detected      INFLUENZA A & B AG (RAPID TEST)    Collection Time: 01/02/21 11:32 PM   Result Value Ref Range    Influenza A Antigen Negative Negative      Influenza B Antigen Negative Negative     CBC WITH AUTOMATED DIFF    Collection Time: 01/03/21  4:08 AM   Result Value Ref Range    WBC 21.8 (H) 3.6 - 11.0 K/uL    RBC 3.46 (L) 3.80 - 5.20 M/uL    HGB 10.8 (L) 11.5 - 16.0 g/dL    HCT 33.9 (L) 35.0 - 47.0 %    MCV 98.0 80.0 - 99.0 FL    MCH 31.2 26.0 - 34.0 PG    MCHC 31.9 30.0 - 36.5 g/dL    RDW 13.3 11.5 - 14.5 %    PLATELET 279 150 - 400 K/uL    MPV 8.9 8.9 - 12.9 FL    NRBC 0.0 0.0 PER 100 WBC    ABSOLUTE NRBC 0.00 0.00 - 0.01 K/uL    NEUTROPHILS 85 (H) 32 - 75 %    LYMPHOCYTES 9 (L) 12 - 49 %    MONOCYTES 4 (L) 5 - 13 %    EOSINOPHILS 1 0 - 7 %    BASOPHILS 0 0 - 1 %    IMMATURE GRANULOCYTES 1 (H) 0 - 0.5 %    ABS. NEUTROPHILS 18.7 (H) 1.8 - 8.0 K/UL    ABS. LYMPHOCYTES 1.9 0.8 - 3.5 K/UL    ABS. MONOCYTES 0.9 0.0 - 1.0 K/UL    ABS. EOSINOPHILS 0.1 0.0 - 0.4 K/UL    ABS. BASOPHILS 0.0 0.0 - 0.1 K/UL    ABS. IMM. GRANS. 0.2 (H) 0.00 - 0.04 K/UL    DF AUTOMATED     RENAL FUNCTION PANEL    Collection Time: 01/03/21  4:08 AM   Result Value Ref Range    Sodium 139 136 - 145 mmol/L    Potassium 3.6 3.5 - 5.1 mmol/L    Chloride 108 97 - 108 mmol/L    CO2 23 21 - 32 mmol/L    Anion gap 8 5 - 15 mmol/L    Glucose 118 (H) 65 - 100 mg/dL    BUN 15 6 - 20 mg/dL    Creatinine 0.76 0.55 - 1.02 mg/dL    BUN/Creatinine ratio 20 12 - 20      eGFR >60 >60 ml/min/1.81m    Calcium 9.0 8.5 - 10.1 mg/dL    Phosphorus 2.5 (L) 2.6 - 4.7 mg/dL    Albumin 3.0 (L) 3.5 - 5.0 g/dL   PROCALCITONIN    Collection Time: 01/03/21  4:08 AM   Result Value Ref Range    Procalcitonin 0.35 (H) 0 ng/mL       Physical Exam    Neuro Physical Exam      General: Well developed, well nourished. Patient in no apparent distress.  Cardiac: Regular rate and rhythm.  Other:     Neurological Exam:  Mental Status: Awake alert oriented x4   Cranial Nerves:   Intact visual  fields.  PERRL, EOM's full, no nystagmus, no ptosis. Facial sensation is normal. Facial movement is symmetric.  Palate is midline.  Normal sternocleidomastoid strength. Tongue is midline. Hearing is intact bilaterally.   Motor:  5/5 strength in upper and lower proximal and distal muscles. Normal bulk and tone.    Reflexes:   Deep tendon reflexes 2+/4 and symmetrical.   Sensory:   Normal to light touch throughout, proprioception intact   Gait Not tested because patient uses walker at baseline   Tremor:   No tremor noted.   Cerebellar:  No cerebellar signs present.   Babinski:      Down bilaterally    Assessment and plan: [name] is a 75 year old woman with severe dizziness especially upon walking.  Neurology was consulted for frequent falls but falls are secondary to her lightheadedness possibly secondary to anemia.  No focal neurological deficits.  No further neurological work-up.  Recommend checking orthostatic blood pressures.

## 2021-01-03 NOTE — ED Notes (Signed)
Patient resting quietly in bed at this time. Resp reg and unlabored. No distress noted. Vital signs WNL at this time.

## 2021-01-03 NOTE — Progress Notes (Signed)
Reason for Admission:  Sepsis                     RUR Score:   10%                  Plan for utilizing home health:  Pt signed Choice Letter for HH/referral sent via [facility]/uses walker/awaiting therapy evals.         PCP: First and Last name:  Cioflec, Doran Durand, MD     Name of Practice:    Are you a current patient: Yes/No: Yes   Approximate date of last visit: Been a while since last visit.    Can you participate in a virtual visit with your PCP: Yes/Call.                    Current Advanced Directive/Advance Care Plan: Full Code      Healthcare Decision Maker:     Primary Decision Maker: Ileene Rubens [phone]                  Transition of Care Plan:                    D/C Plan is home alone with home health.Friend to transport home. Send Rxs to [facility] in [facility] upon discharge.

## 2021-01-03 NOTE — ACP (Advance Care Planning) (Signed)
Advance Care Planning  Healthcare Decision Maker:       Primary Decision Maker: [name] [phone]

## 2021-01-03 NOTE — Progress Notes (Signed)
Progress  Notes by Vira Browns, NP at 01/03/21 1429                Author: Vira Browns, NP  Service: Internal Medicine  Author Type: Nurse Practitioner       Filed: 01/03/21 1438  Date of Service: 01/03/21 1429  Status: Signed          Editor: Vira Browns, NP (Nurse Practitioner)                        Hospitalist Progress Note          Vira Browns, APRN, FNP-C      Daily Progress Note: 01/03/2021         Subjective:    Subjective    Patient examined alert and oriented sitting in bed. She reports she lives home alone and has been having a lot of falls.       Review of Systems:    Review of Systems    Constitutional:  Negative for chills and fever.    Eyes:  Negative for blurred vision.    Respiratory:  Negative for cough.     Cardiovascular:  Negative for chest pain and leg swelling.    Gastrointestinal:  Negative for abdominal pain, nausea and vomiting.    Genitourinary:  Negative for dysuria.    Musculoskeletal:  Positive for falls and myalgias .    Skin:  Negative for rash.    Neurological:  Positive for weakness.          Objective:    Objective        Vitals:   Patient Vitals for the past 12 hrs:            Temp  Pulse  Resp  BP  SpO2            01/03/21 1407  97.8 ??F (36.6 ??C)  90  18  112/61  95 %            01/03/21 1323  --  89  22  113/69  97 %     01/03/21 1153  --  92  24  117/64  97 %     01/03/21 0810  --  --  --  --  95 %     01/03/21 0710  --  93  26  123/65  95 %     01/03/21 0702  --  99  25  --  95 %     01/03/21 0647  --  92  23  --  97 %     01/03/21 0532  98.2 ??F (36.8 ??C)  100  24  132/78  93 %     01/03/21 0347  --  (!) 108  17  131/68  95 %     01/03/21 0332  --  (!) 108  26  138/64  94 %     01/03/21 0317  --  96  26  131/74  95 %     01/03/21 0302  --  (!) 103  23  135/76  94 %     01/03/21 0247  --  (!) 103  24  132/74  94 %            01/03/21 0232  --  (!) 110  18  137/76  95 %            Physical Exam   Vitals and nursing note reviewed.  Constitutional:         Appearance: Normal appearance.     Eyes:       Extraocular Movements: Extraocular movements intact.     Cardiovascular:       Rate and Rhythm: Normal rate.    Pulmonary:       Breath sounds: Normal breath sounds.    Skin:      General: Skin is warm and dry.       Findings: Bruising present.       Comments: Left facial bruising    Neurological:       Mental Status: She is alert and oriented to person, place, and time.       Motor: Weakness present.       Gait: Gait abnormal.           Lab Results:     Recent Results (from the past 24 hour(s))     EKG, 12 LEAD, INITIAL          Collection Time: 01/02/21  6:29 PM         Result  Value  Ref Range            Ventricular Rate  126  BPM       Atrial Rate  126  BPM       P-R Interval  156  ms       QRS Duration  62  ms       Q-T Interval  296  ms       QTC Calculation (Bezet)  428  ms       Calculated P Axis  68  degrees       Calculated R Axis  24  degrees       Calculated T Axis  53  degrees       Diagnosis                 Sinus tachycardia   Nonspecific ST abnormality   Abnormal ECG   No previous ECGs available   Confirmed by Geraldine Solar, [email or web address] ([id number]) on 01/03/2021 6:33:56 AM          METABOLIC PANEL, COMPREHENSIVE          Collection Time: 01/02/21  6:36 PM         Result  Value  Ref Range            Sodium  137  136 - 145 mmol/L       Potassium  4.3  3.5 - 5.1 mmol/L       Chloride  108  97 - 108 mmol/L       CO2  20 (L)  21 - 32 mmol/L       Anion gap  9  5 - 15 mmol/L       Glucose  134 (H)  65 - 100 mg/dL       BUN  22 (H)  6 - 20 mg/dL       Creatinine  0.85  0.55 - 1.02 mg/dL       BUN/Creatinine ratio  26 (H)  12 - 20         eGFR  >60  >60 ml/min/1.34m       Calcium  9.1  8.5 - 10.1 mg/dL       Bilirubin, total  0.4  0.2 - 1.0 mg/dL       AST (SGOT)  42 (H)  15 - 37 U/L  ALT (SGPT)  45  12 - 78 U/L       Alk. phosphatase  95  45 - 117 U/L       Protein, total  7.2  6.4 - 8.2 g/dL       Albumin  3.5  3.5 - 5.0 g/dL       Globulin  3.7  2.0 - 4.0 g/dL        A-G Ratio  0.9 (L)  1.1 - 2.2         CBC WITH AUTOMATED DIFF          Collection Time: 01/02/21  6:36 PM         Result  Value  Ref Range            WBC  22.5 (H)  3.6 - 11.0 K/uL       RBC  3.73 (L)  3.80 - 5.20 M/uL       HGB  11.9  11.5 - 16.0 g/dL       HCT  36.2  35.0 - 47.0 %       MCV  97.1  80.0 - 99.0 FL       MCH  31.9  26.0 - 34.0 PG       MCHC  32.9  30.0 - 36.5 g/dL       RDW  13.3  11.5 - 14.5 %       PLATELET  312  150 - 400 K/uL       MPV  8.8 (L)  8.9 - 12.9 FL       NRBC  0.0  0.0 PER 100 WBC       ABSOLUTE NRBC  0.00  0.00 - 0.01 K/uL       NEUTROPHILS  91 (H)  32 - 75 %       LYMPHOCYTES  4 (L)  12 - 49 %       MONOCYTES  4 (L)  5 - 13 %       EOSINOPHILS  0  0 - 7 %       BASOPHILS  0  0 - 1 %       IMMATURE GRANULOCYTES  1 (H)  0 - 0.5 %       ABS. NEUTROPHILS  20.5 (H)  1.8 - 8.0 K/UL       ABS. LYMPHOCYTES  0.8  0.8 - 3.5 K/UL       ABS. MONOCYTES  0.9  0.0 - 1.0 K/UL       ABS. EOSINOPHILS  0.1  0.0 - 0.4 K/UL       ABS. BASOPHILS  0.1  0.0 - 0.1 K/UL       ABS. IMM. GRANS.  0.2 (H)  0.00 - 0.04 K/UL       DF  AUTOMATED          TROPONIN-HIGH SENSITIVITY          Collection Time: 01/02/21  6:36 PM         Result  Value  Ref Range            Troponin-High Sensitivity  8  0 - 51 ng/L       NT-PRO BNP          Collection Time: 01/02/21  6:36 PM         Result  Value  Ref Range  NT pro-BNP  205 (H)  <125 pg/mL       CULTURE, BLOOD, PAIRED          Collection Time: 01/02/21  7:26 PM       Specimen: Blood         Result  Value  Ref Range            Special Requests:  No Special Requests          Culture result:  No growth after 17 hours          LACTIC ACID          Collection Time: 01/02/21  7:26 PM         Result  Value  Ref Range            Lactic acid  1.0  0.4 - 2.0 mmol/L       URINALYSIS W/ REFLEX CULTURE          Collection Time: 01/02/21  9:44 PM       Specimen: Urine         Result  Value  Ref Range            Color  Yellow/Straw          Appearance  Clear  Clear          Specific gravity  1.015  1.003 - 1.030         pH (UA)  5.0          Protein  Negative  Negative mg/dL       Glucose  Negative  Negative mg/dL       Ketone  15 (A)  Negative mg/dL       Bilirubin  Negative  Negative         Blood  Negative  Negative         Urobilinogen  0.1 (L)  0.2 - 1.0 EU/dL       Nitrites  Negative  Negative         Leukocyte Esterase  Negative  Negative         UA:UC IF INDICATED  Culture not indicated by UA result  Culture not indicated by UA result         WBC  0-4  0 - 4 /hpf       RBC  0-5  0 - 5 /hpf       Bacteria  Negative  Negative /hpf       Mucus  Trace  /lpf       COVID-19 RAPID TEST          Collection Time: 01/02/21 11:32 PM         Result  Value  Ref Range            COVID-19 rapid test  Not Detected  Not Detected         INFLUENZA A & B AG (RAPID TEST)          Collection Time: 01/02/21 11:32 PM         Result  Value  Ref Range            Influenza A Antigen  Negative  Negative         Influenza B Antigen  Negative  Negative         CBC WITH AUTOMATED DIFF          Collection Time: 01/03/21  4:08 AM  Result  Value  Ref Range            WBC  21.8 (H)  3.6 - 11.0 K/uL       RBC  3.46 (L)  3.80 - 5.20 M/uL       HGB  10.8 (L)  11.5 - 16.0 g/dL       HCT  33.9 (L)  35.0 - 47.0 %       MCV  98.0  80.0 - 99.0 FL       MCH  31.2  26.0 - 34.0 PG       MCHC  31.9  30.0 - 36.5 g/dL       RDW  13.3  11.5 - 14.5 %       PLATELET  279  150 - 400 K/uL       MPV  8.9  8.9 - 12.9 FL       NRBC  0.0  0.0 PER 100 WBC       ABSOLUTE NRBC  0.00  0.00 - 0.01 K/uL       NEUTROPHILS  85 (H)  32 - 75 %       LYMPHOCYTES  9 (L)  12 - 49 %       MONOCYTES  4 (L)  5 - 13 %       EOSINOPHILS  1  0 - 7 %       BASOPHILS  0  0 - 1 %       IMMATURE GRANULOCYTES  1 (H)  0 - 0.5 %       ABS. NEUTROPHILS  18.7 (H)  1.8 - 8.0 K/UL       ABS. LYMPHOCYTES  1.9  0.8 - 3.5 K/UL       ABS. MONOCYTES  0.9  0.0 - 1.0 K/UL       ABS. EOSINOPHILS  0.1  0.0 - 0.4 K/UL       ABS. BASOPHILS  0.0  0.0 - 0.1 K/UL        ABS. IMM. GRANS.  0.2 (H)  0.00 - 0.04 K/UL       DF  AUTOMATED          RENAL FUNCTION PANEL          Collection Time: 01/03/21  4:08 AM         Result  Value  Ref Range            Sodium  139  136 - 145 mmol/L       Potassium  3.6  3.5 - 5.1 mmol/L       Chloride  108  97 - 108 mmol/L       CO2  23  21 - 32 mmol/L       Anion gap  8  5 - 15 mmol/L       Glucose  118 (H)  65 - 100 mg/dL       BUN  15  6 - 20 mg/dL       Creatinine  0.76  0.55 - 1.02 mg/dL       BUN/Creatinine ratio  20  12 - 20         eGFR  >60  >60 ml/min/1.75m       Calcium  9.0  8.5 - 10.1 mg/dL       Phosphorus  2.5 (L)  2.6 - 4.7 mg/dL       Albumin  3.0 (L)  3.5 - 5.0 g/dL       PROCALCITONIN          Collection Time: 01/03/21  4:08 AM         Result  Value  Ref Range            Procalcitonin  0.35 (H)  0 ng/mL           Results                  Procedure  Component  Value  Units  Date/Time           COVID-19 RAPID TEST [[id number]]  Collected: 01/02/21 2332            Order Status: Completed  Specimen: Nasopharyngeal  Updated: 01/03/21 0101                COVID-19 rapid test  Not Detected                  Comment:  Rapid [employer] ID Now   The specimen is NEGATIVE for SARS-CoV2, the novel coronavirus associated with COVID-19. A negative result does  not rule out COVID-19.   This test has been authorized by the FDA under an Emergency Use Authorization (EUA) for use by authorized laboratories.   Fact sheet for Healthcare Providers:  DebtSupply.pl Fact sheet for Patients:  https://lee-mcguire.com/   Methodology: Isothermal Nucleic Acid Amplification                     INFLUENZA A & B AG (RAPID TEST) [[id number]]  Collected: 01/02/21 2332            Order Status: Completed  Specimen: Nasopharyngeal from Nasal washing  Updated: 01/03/21 0103                Influenza A Antigen  Negative              Influenza B Antigen  Negative                CULTURE, URINE [[id number]]  Collected: 01/02/21 2315             Order Status: Sent  Specimen: Urine  Updated: 01/03/21 0728           COVID-19 WITH INFLUENZA A/B [[id number]]              Order Status: Canceled  Specimen: Nasopharyngeal             CULTURE, BLOOD, PAIRED [[id number]]  Collected: 01/02/21 1926            Order Status: Completed  Specimen: Blood  Updated: 01/03/21 1244                Special Requests:  No Special Requests                     Culture result:  No growth after 17 hours                           Diagnostic Images:     CT Results  (Last 48 hours)                                       01/02/21 1914    CT HEAD WO CONT  Final result  Impression:    Left frontal soft tissue swelling. No acute intracranial abnormality.                                Narrative:    EXAM: CT HEAD WO CONT             INDICATION: fall             COMPARISON: 12/01/2020.             CONTRAST: None.             TECHNIQUE: Unenhanced CT of the head was performed using 5 mm images. Brain and      bone windows were generated. Coronal and sagittal reformats. CT dose reduction      was achieved through use of a standardized protocol tailored for this      examination and automatic exposure control for dose modulation.               FINDINGS:      The ventricles and sulci are normal in size, shape and configuration.. Small      vessel ischemic changes are stable compared to the prior exam. There is no      intracranial hemorrhage, extra-axial collection, or mass effect. The basilar      cisterns are open. No CT evidence of acute infarct.             The bone windows demonstrate no abnormalities. The visualized portions of the      paranasal sinuses and mastoid air cells are clear. Left frontal soft tissue      swelling is noted.                          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                       Narrative:    EXAM:  CT CERVICAL SPINE WITHOUT CONTRAST              INDICATION: fall.             COMPARISON: None.             CONTRAST:  None.             TECHNIQUE: Multislice helical CT of the cervical spine was performed without      intravenous contrast administration.  Sagittal and coronal reformats were      generated.  CT dose reduction was achieved through use of a standardized      protocol tailored for this examination and automatic exposure control for dose      modulation.              FINDINGS:             The alignment is within normal limits. There is no fracture or compression      deformity. The odontoid process is intact. The craniocervical junction is within      normal limits. Significant multilevel spondylosis is noted, greatest at C3-4,      C4-5, C5-C6. 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 Bilateral neural foraminal narrowing is  also noted at these      levels secondary to uncovertebral osteophyte formation.                                          CT HEAD WO CONT       Final Result     Left frontal soft tissue swelling. No acute intracranial abnormality.                      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            XR CHEST PORT       Final Result     No acute cardiopulmonary process.                                Current Medications:      Current Facility-Administered Medications:    ?  enoxaparin (LOVENOX) injection 30 mg, 30 mg, SubCUTAneous, DAILY, Kondragunta, Sakuntla, MD, 30 mg at 01/03/21 0900   ?  cefTARoline (TEFLARO) 400 mg in 0.9% sodium chloride (MBP/ADV) 50 mL MBP, 400 mg, IntraVENous, Q12H, Vallarie Mare, MD, Last Rate: 50  mL/hr at 01/03/21 1320, 400 mg at 01/03/21 1320   ?  sodium chloride (NS) flush 5-10 mL, 5-10 mL, IntraVENous, PRN, Calcagni, Raelyn Ensign, MD, 10 mL at 01/02/21 1930   ?  albuterol (PROVENTIL HFA, VENTOLIN HFA, PROAIR HFA) inhaler 2 Puff, 2 Puff, Inhalation, Q4H PRN, Crawford Givens, MD   ?  pantoprazole  (PROTONIX) tablet 40 mg, 40 mg, Oral, DAILY, Crawford Givens, MD, 40 mg at 01/03/21 0901   ?  atorvastatin (LIPITOR) tablet 40 mg, 40 mg, Oral, DAILY, Crawford Givens, MD, 40 mg at 01/03/21 0901   ?  metoprolol succinate (TOPROL-XL) XL tablet 25 mg, 25 mg, Oral, DAILY, Kondragunta, Sakuntla, MD, 25 mg at 01/03/21 0901   ?  budesonide-formoteroL (SYMBICORT) 160-4.5 mcg/actuation HFA inhaler 2 Puff, 2 Puff, Inhalation, BID RT, 2 Puff at 01/03/21 0605 **AND**  tiotropium bromide (SPIRIVA RESPIMAT) 2.5 mcg Essie Hart, Inhalation, DAILY, Crawford Givens, MD, 2 Puff at 01/03/21 0809   ?  sodium chloride (NS) flush 5-40 mL, 5-40 mL, IntraVENous, Q8H, Garnette Gunner, Sakuntla, MD   ?  sodium chloride (NS) flush 5-40 mL, 5-40 mL, IntraVENous, PRN, Crawford Givens, MD   ?  acetaminophen (TYLENOL) tablet 650 mg, 650 mg, Oral, Q6H PRN **OR** acetaminophen (TYLENOL) suppository 650 mg, 650 mg, Rectal, Q6H PRN,  Crawford Givens, MD   ?  ondansetron (ZOFRAN ODT) tablet 4 mg, 4 mg, Oral, Q6H PRN **OR** ondansetron (ZOFRAN) injection 4 mg, 4 mg, IntraVENous, Q6H PRN, Crawford Givens, MD          ASSESSMENT:   75 y.o. female with history of COPD stable on treatment maintenance inhalers, hyperlipidemia, history of recurrent falls with generalized weakness suspected of cardiac syncope presents to the emergency  room complaining of recurrent falls.  Last 3 days she was falling and injuring herself and she does not know how she is falling.  Admits dizziness with ambulation.  Denies any chest pain or palpitations.  Denies any fever or chills.  Admits appetite  is decreased and not eating well.       On  review of previous admission physical therapy Occupational Therapy notes this patient does have trouble with gait short and shuffled gait with unsteadiness, has dizziness with ambulation was discharged to [facility] care rehab facility  on November 9.  But not sure how long she was  there when she went home.       Evaluation in the emergency room significant leukocytosis with left shift, negative evidence of infection, chest x-ray did not show any pneumonia.  Urine analysis is clear, skin with no wounds.  Negative for COVID-19, influenza.  She seems to have  infection but source is unclear at this time.       She does have mild cough but states not producing any sputum.  It sounded congested when I was examining her.            1.  Sepsis syndrome:    With leukocytosis with left shift and tachycardia meets the criteria.     But source is unclear at this point.    Continue ceftriaxone prophylaxis       2.  Gait disturbance w/ recurrent falls and injury   Recurrent falls, has similar issues in the past and admission needed.     Previous therapy eval suggest  short step shuffling gait and unsteadiness.     Obtain orthostatics   Recent echo Normal LEF   CM for disposition planning   Obtain therapy eval   She is agreeable to rehab placement      3.  Benign essential hypertension:    Bp currently at goal   Currently on metoprolol succinate 56m daily with holding parameters.     Obtain orthostatics      4.  Chronic obstructive pulmonary disease    no evidence of exacerbation.   Continue inhalers          Full Code   Dvt Prophylaxis none   GI Prophylaxis protonix      Above treatment plan reviewed and discussed with patient in detail at bedside, all questions answered.         Care Plan discussed with: Patient/Family         Total time spent with patient: 30 minutes.      JVira Browns NP

## 2021-01-04 ENCOUNTER — Inpatient Hospital Stay: Admit: 2021-01-05 | Payer: MEDICARE | Primary: Internal Medicine

## 2021-01-04 ENCOUNTER — Inpatient Hospital Stay: Admit: 2021-01-04 | Payer: MEDICARE | Primary: Internal Medicine

## 2021-01-04 DIAGNOSIS — A419 Sepsis, unspecified organism: Secondary | ICD-10-CM

## 2021-01-04 LAB — CBC WITH AUTOMATED DIFF
ABS. BASOPHILS: 0 10*3/uL (ref 0.0–0.1)
ABS. EOSINOPHILS: 0.3 10*3/uL (ref 0.0–0.4)
ABS. IMM. GRANS.: 0.1 10*3/uL — ABNORMAL HIGH (ref 0.00–0.04)
ABS. LYMPHOCYTES: 1.9 10*3/uL (ref 0.8–3.5)
ABS. MONOCYTES: 0.8 10*3/uL (ref 0.0–1.0)
ABS. NEUTROPHILS: 13.3 10*3/uL — ABNORMAL HIGH (ref 1.8–8.0)
ABSOLUTE NRBC: 0 10*3/uL (ref 0.00–0.01)
BASOPHILS: 0 % (ref 0–1)
EOSINOPHILS: 2 % (ref 0–7)
HCT: 33.4 % — ABNORMAL LOW (ref 35.0–47.0)
HGB: 10.9 g/dL — ABNORMAL LOW (ref 11.5–16.0)
IMMATURE GRANULOCYTES: 1 % — ABNORMAL HIGH (ref 0–0.5)
LYMPHOCYTES: 12 % (ref 12–49)
MCH: 31.6 PG (ref 26.0–34.0)
MCHC: 32.6 g/dL (ref 30.0–36.5)
MCV: 96.8 FL (ref 80.0–99.0)
MONOCYTES: 5 % (ref 5–13)
MPV: 8.7 FL — ABNORMAL LOW (ref 8.9–12.9)
NEUTROPHILS: 80 % — ABNORMAL HIGH (ref 32–75)
NRBC: 0 PER 100 WBC
PLATELET: 270 10*3/uL (ref 150–400)
RBC: 3.45 M/uL — ABNORMAL LOW (ref 3.80–5.20)
RDW: 13.2 % (ref 11.5–14.5)
WBC: 16.5 10*3/uL — ABNORMAL HIGH (ref 3.6–11.0)

## 2021-01-04 LAB — CULTURE, URINE
Culture result:: NO GROWTH
Culture: NO GROWTH

## 2021-01-04 LAB — PROCALCITONIN
Procalcitonin: 0.23 ng/mL — ABNORMAL HIGH
Procalcitonin: 0.23 ng/mL — ABNORMAL HIGH

## 2021-01-04 LAB — C REACTIVE PROTEIN, QT: C-Reactive protein: 16.5 mg/dL — ABNORMAL HIGH (ref 0.00–0.60)

## 2021-01-04 LAB — CBC WITH AUTO DIFFERENTIAL
Basophils %: 0 % (ref 0–1)
Basophils Absolute: 0 10*3/uL (ref 0.0–0.1)
Eosinophils %: 2 % (ref 0–7)
Eosinophils Absolute: 0.3 10*3/uL (ref 0.0–0.4)
Granulocyte Absolute Count: 0.1 10*3/uL — ABNORMAL HIGH (ref 0.00–0.04)
Hematocrit: 33.4 % — ABNORMAL LOW (ref 35.0–47.0)
Hemoglobin: 10.9 g/dL — ABNORMAL LOW (ref 11.5–16.0)
Immature Granulocytes: 1 % — ABNORMAL HIGH (ref 0–0.5)
Lymphocytes %: 12 % (ref 12–49)
Lymphocytes Absolute: 1.9 10*3/uL (ref 0.8–3.5)
MCH: 31.6 PG (ref 26.0–34.0)
MCHC: 32.6 g/dL (ref 30.0–36.5)
MCV: 96.8 FL (ref 80.0–99.0)
MPV: 8.7 FL — ABNORMAL LOW (ref 8.9–12.9)
Monocytes %: 5 % (ref 5–13)
Monocytes Absolute: 0.8 10*3/uL (ref 0.0–1.0)
NRBC Absolute: 0 10*3/uL (ref 0.00–0.01)
Neutrophils %: 80 % — ABNORMAL HIGH (ref 32–75)
Neutrophils Absolute: 13.3 10*3/uL — ABNORMAL HIGH (ref 1.8–8.0)
Nucleated RBCs: 0 PER 100 WBC
Platelets: 270 10*3/uL (ref 150–400)
RBC: 3.45 M/uL — ABNORMAL LOW (ref 3.80–5.20)
RDW: 13.2 % (ref 11.5–14.5)
WBC: 16.5 10*3/uL — ABNORMAL HIGH (ref 3.6–11.0)

## 2021-01-04 LAB — C-REACTIVE PROTEIN: CRP: 16.5 mg/dL — ABNORMAL HIGH (ref 0.00–0.60)

## 2021-01-04 MED ORDER — PIPERACILLIN-TAZOBACTAM 4.5 GRAM IV SOLR
4.5 gram | Freq: Once | INTRAVENOUS | Status: AC
Start: 2021-01-04 — End: 2021-01-05

## 2021-01-04 MED ORDER — PIPERACILLIN-TAZOBACTAM 3.375 GRAM IV SOLR
3.375 gram | Freq: Three times a day (TID) | INTRAVENOUS | Status: AC
Start: 2021-01-04 — End: 2021-01-09
  Administered 2021-01-05 – 2021-01-09 (×13): via INTRAVENOUS

## 2021-01-04 MED ORDER — SODIUM CHLORIDE 0.9% BOLUS IV
0.9 % | Freq: Once | INTRAVENOUS | Status: AC
Start: 2021-01-04 — End: 2021-01-04
  Administered 2021-01-04: 20:00:00 via INTRAVENOUS

## 2021-01-04 MED ORDER — KETOROLAC TROMETHAMINE 15 MG/ML INJECTION
15 mg/mL | INTRAMUSCULAR | Status: AC
Start: 2021-01-04 — End: 2021-01-04
  Administered 2021-01-04: 17:00:00 via INTRAMUSCULAR

## 2021-01-04 MED FILL — TEFLARO 400 MG INTRAVENOUS SOLUTION: 400 mg | INTRAVENOUS | Qty: 400

## 2021-01-04 MED FILL — ATORVASTATIN 40 MG TAB: 40 mg | ORAL | Qty: 1

## 2021-01-04 MED FILL — SYMBICORT 160 MCG-4.5 MCG/ACTUATION HFA AEROSOL INHALER: RESPIRATORY_TRACT | Qty: 12

## 2021-01-04 MED FILL — SODIUM CHLORIDE 0.9 % IV: INTRAVENOUS | Qty: 250

## 2021-01-04 MED FILL — METOPROLOL SUCCINATE SR 50 MG 24 HR TAB: 50 mg | ORAL | Qty: 1

## 2021-01-04 MED FILL — SYMBICORT 160 MCG-4.5 MCG/ACTUATION HFA AEROSOL INHALER: RESPIRATORY_TRACT | Qty: 6

## 2021-01-04 MED FILL — PANTOPRAZOLE 40 MG TAB, DELAYED RELEASE: 40 mg | ORAL | Qty: 1

## 2021-01-04 MED FILL — SPIRIVA RESPIMAT 2.5 MCG/ACTUATION SOLUTION FOR INHALATION: 2.5 mcg/actuation | RESPIRATORY_TRACT | Qty: 4

## 2021-01-04 MED FILL — ENOXAPARIN 30 MG/0.3 ML SUB-Q SYRINGE: 30 mg/0.3 mL | SUBCUTANEOUS | Qty: 0.3

## 2021-01-04 MED FILL — KETOROLAC TROMETHAMINE 15 MG/ML INJECTION: 15 mg/mL | INTRAMUSCULAR | Qty: 1

## 2021-01-04 NOTE — Consults (Signed)
Consult Date: 01/04/2021    Consults  [name] is a 75 year old woman with past medical history of COPD, active smoker who has been having severe dizziness for at least the past few days but she cannot recall exactly when it started.  She ambulates with a walker and has been using this walker over the past several months.  She says that she is having severe dizziness which is causing her to fall.  She recalls yesterday she was setting up in the kitchen and as she was trying to get out of the kitchen to walk in the hallway she was felt very very dizzy.  She denies any room spinning.  She cannot give any further history.  Denies any focal symptoms numbness tingling or weakness.  CT scan of the head was unrevealing.  Labs show anemia.    Subjective   [name] tells me today she is having headache this morning which she has been having for the past several months at least.  She usually has early morning headaches.  She has been taking very frequent Tylenol and ibuprofen for this.  She has also been falling for several months if not years.  Recently moved to [place] from [place] and does not have any doctors here.    Past Medical History:   Diagnosis Date    Chronic obstructive pulmonary disease (HCC)     Hypercholesteremia     Insomnia     Sleep disorder       History reviewed. No pertinent surgical history.  No family history on file.   Social History     Tobacco Use    Smoking status: Former     Packs/day: 1.00     Types: Cigarettes     Quit date: 2021     Years since quitting: 1.9     Passive exposure: Past    Smokeless tobacco: Never   Substance Use Topics    Alcohol use: Yes     Alcohol/week: 1.0 standard drink     Types: 1 Cans of beer per week     Comment: 1 can of beer every 1-2 weeks       Current Facility-Administered Medications   Medication Dose Route Frequency Provider Last Rate Last Admin    ketorolac (TORADOL) injection 15 mg  15 mg IntraMUSCular NOW Osie Bond, MD        enoxaparin  (LOVENOX) injection 30 mg  30 mg SubCUTAneous DAILY Celso Sickle, Sakuntla, MD   30 mg at 01/03/21 0900    cefTARoline (TEFLARO) 400 mg in 0.9% sodium chloride (MBP/ADV) 50 mL MBP  400 mg IntraVENous Q12H Lorina Rabon, MD 50 mL/hr at 01/03/21 2030 400 mg at 01/03/21 2030    sodium chloride (NS) flush 5-10 mL  5-10 mL IntraVENous PRN Calcagni, Althea Grimmer, MD   10 mL at 01/02/21 1930    albuterol (PROVENTIL HFA, VENTOLIN HFA, PROAIR HFA) inhaler 2 Puff  2 Puff Inhalation Q4H PRN [name], MD        pantoprazole (PROTONIX) tablet 40 mg  40 mg Oral DAILY [name], MD   40 mg at 01/03/21 0901    atorvastatin (LIPITOR) tablet 40 mg  40 mg Oral DAILY [name], MD   40 mg at 01/03/21 0901    metoprolol succinate (TOPROL-XL) XL tablet 25 mg  25 mg Oral DAILY [name], MD   25 mg at 01/03/21 0901    budesonide-formoteroL (SYMBICORT) 160-4.5 mcg/actuation HFA inhaler 2 Puff  2 Puff Inhalation  BID RT [name], MD   2 Puff at 01/03/21 2104    And    tiotropium bromide (SPIRIVA RESPIMAT) 2.5 mcg /actuation  2 Puff Inhalation DAILY [name], MD   2 Puff at 01/03/21 0809    sodium chloride (NS) flush 5-40 mL  5-40 mL IntraVENous Q8H [name], MD   10 mL at 01/04/21 0542    sodium chloride (NS) flush 5-40 mL  5-40 mL IntraVENous PRN [name], MD        acetaminophen (TYLENOL) tablet 650 mg  650 mg Oral Q6H PRN [name], MD        ondansetron (ZOFRAN ODT) tablet 4 mg  4 mg Oral Q6H PRN [name], MD        Or    ondansetron (ZOFRAN) injection 4 mg  4 mg IntraVENous Q6H PRN [name], MD            Review of Systems   Neurological:  Positive for dizziness.   All other systems reviewed and are negative.    Objective     Vital signs for last 24 hours:  Visit Vitals  BP 132/82 (BP 1 Location: Left upper arm, BP Patient Position: At rest;Lying)   Pulse 90   Temp 97.6 ??F (36.4 ??C)   Resp 19   Ht 5'  (1.524 m)   Wt 43.5 kg (95 lb 14.4 oz)   SpO2 100%   Breastfeeding No   BMI 18.73 kg/m??       Intake/Output this shift:  Current Shift: No intake/output data recorded.  Last 3 Shifts: 12/11 1901 - 12/13 0700  In: 50 [I.V.:50]  Out: -     Data Review:   Recent Results (from the past 24 hour(s))   CBC WITH AUTOMATED DIFF    Collection Time: 01/04/21  7:22 AM   Result Value Ref Range    WBC 16.5 (H) 3.6 - 11.0 K/uL    RBC 3.45 (L) 3.80 - 5.20 M/uL    HGB 10.9 (L) 11.5 - 16.0 g/dL    HCT 28.4 (L) 13.2 - 47.0 %    MCV 96.8 80.0 - 99.0 FL    MCH 31.6 26.0 - 34.0 PG    MCHC 32.6 30.0 - 36.5 g/dL    RDW 44.0 10.2 - 72.5 %    PLATELET 270 150 - 400 K/uL    MPV 8.7 (L) 8.9 - 12.9 FL    NRBC 0.0 0.0 PER 100 WBC    ABSOLUTE NRBC 0.00 0.00 - 0.01 K/uL    NEUTROPHILS 80 (H) 32 - 75 %    LYMPHOCYTES 12 12 - 49 %    MONOCYTES 5 5 - 13 %    EOSINOPHILS 2 0 - 7 %    BASOPHILS 0 0 - 1 %    IMMATURE GRANULOCYTES 1 (H) 0 - 0.5 %    ABS. NEUTROPHILS 13.3 (H) 1.8 - 8.0 K/UL    ABS. LYMPHOCYTES 1.9 0.8 - 3.5 K/UL    ABS. MONOCYTES 0.8 0.0 - 1.0 K/UL    ABS. EOSINOPHILS 0.3 0.0 - 0.4 K/UL    ABS. BASOPHILS 0.0 0.0 - 0.1 K/UL    ABS. IMM. GRANS. 0.1 (H) 0.00 - 0.04 K/UL    DF AUTOMATED     C REACTIVE PROTEIN, QT    Collection Time: 01/04/21  7:22 AM   Result Value Ref Range    C-Reactive protein 16.50 (H) 0.00 - 0.60 mg/dL   PROCALCITONIN  Collection Time: 01/04/21  7:22 AM   Result Value Ref Range    Procalcitonin 0.23 (H) 0 ng/mL       Physical Exam    Neuro Physical Exam      General: Well developed, well nourished. Patient in no apparent distress.  Cardiac: Regular rate and rhythm.  Other:     Neurological Exam:  Mental Status: Awake alert oriented x4   Cranial Nerves:   Intact visual fields.  PERRL, EOM's full, no nystagmus, no ptosis. Facial sensation is normal. Facial movement is symmetric.  Palate is midline.  Normal sternocleidomastoid strength. Tongue is midline. Hearing is intact bilaterally.   Motor:  5/5 strength in upper and  lower proximal and distal muscles. Normal bulk and tone.    Reflexes:   Deep tendon reflexes 3+/4 left patellar, did not test right one due to pain.   3+ in bilateral brachiorad   Sensory:   Normal to light touch throughout, proprioception intact   Gait Not tested because patient uses walker at baseline   Tremor:   No tremor noted.   Cerebellar:  No cerebellar signs present.   Babinski:      Up bilaterally    Assessment and plan: [name] is a 75 year old woman with severe dizziness especially upon walking.  Neurology was consulted for frequent falls but falls are secondary to her lightheadedness possibly secondary to anemia.      On today's examination she does have Babinski and some hyperreflexia.  I will rule out cord compression with MRI of the cervical and thoracic spine.  If these are normal I plan to see her in the office in about 4 to 6 weeks for further work-up for recurrent falls and likely will proceed with an EMG.    Headaches: Possibly medication overuse headaches and she has been discouraged from using over-the-counter medication more than 2 days a week.  We will also give her 1 IM injection of Toradol now and Tylenol as needed for her current headache.

## 2021-01-04 NOTE — Progress Notes (Signed)
Progress Notes by Lorina Rabon, MD at 01/04/21 1440                Author: Lorina Rabon, MD  Service: --  Author Type: Physician       Filed: 01/04/21 1843  Date of Service: 01/04/21 1440  Status: Signed          Editor: Lorina Rabon, MD (Physician)                             Progress Note          Patient: [name]  MRN: [id number]   SSN: [id number]          Date of Birth: Feb 24, 1945   Age: 75 y.o.   Sex: female         Admit Date: 01/02/2021     LOS: 2 days         Subjective:     Patient with concern for sepsis, however, no obvious source of infection..  She remains afebrile with decreasing WBC and procal.  CRP also elevated. MRI ordered to rule out cord compression but showed abnormalities suggestive  of pneumonia. Dedicated CT Chest recommend.   Patient resting comfortably with no new complaints.         Objective:          Vitals:             01/03/21 2105  01/04/21 0235  01/04/21 0447  01/04/21 0759           BP:      124/82  132/82     Pulse:      81  90     Resp:      20  19     Temp:      97.5 ??F (36.4 ??C)  97.6 ??F (36.4 ??C)     SpO2:  94%    96%  100%     Weight:    95 lb 14.4 oz (43.5 kg)               Height:                    Intake and Output:   Current Shift: No intake/output data recorded.   Last three shifts: 12/11 1901 - 12/13 0700   In: 50 [I.V.:50]   Out: -       Physical Exam:    Vitals and nursing note reviewed.    Constitutional:        Appearance: She is not ill-appearing.    HENT:       Head: Normocephalic and atraumatic.         Comments: Flesh colored swelling left forehead, nontender, no erythema, no ecchymoses      Right Ear: External ear normal.       Left Ear: External ear normal.       Mouth/Throat:       Mouth: Mucous membranes are moist.    Eyes:       Pupils: Pupils are equal, round, and reactive to light.    Cardiovascular:       Rate and Rhythm: Normal rate and regular rhythm.    Pulmonary:       Effort: Pulmonary effort is normal.       Breath sounds:  Normal breath sounds.    Abdominal:       General: Bowel sounds are normal.  There is no distension.       Palpations: Abdomen is soft.       Tenderness: There is no abdominal tenderness.    Musculoskeletal:       Cervical back: Neck supple.       Right lower leg: No edema.       Left lower leg: No edema.    Skin:      Findings: No lesion or rash.    Neurological:       General: No focal deficit present.       Mental Status: She is alert and oriented to person, place, and time.    Psychiatric:          Behavior: Behavior normal.       Lab/Data Review:       WBC 16,500      CRP 16.50    Procal 0.23 <0.35      Blood cultures (12/11) No growth 2 days   Urine culture (12/11) No growth FINAL      MRI Thoracic spine w/o contrast (12/13)   1.  Subacute-appearing severe T10 compression fracture with osseous   retropulsion. 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 Associated T9-T11 Mild-to-moderate spinal   canal and neuroforaminal stenoses.   2.  T6-T7 Central disc extrusion with mild spinal stenosis at this level.   3.  Chronic T7 compression deformity.   4.  Scattered signal abnormalities in the bilateral lungs and a small right   pleural effusion may reflect focal pneumonia. Dedicated chest CT recommended for   further characterization.             Assessment:        Principal Problem:     Sepsis (HCC) (01/02/2021)      1.  Possible sepsis with leukocytosis, elevated CRP and procal,  etiology unclear   2.  Soft tissue swelling left forehead, no signs of infection   3.  Frequent falls with dizziness   4.  Babinksi and hyperreflexia (per Neurology), ruling out cord compression   5.  Possible pneumonia (per MRI)   6.     Comment: Inarguably significant leukocytosis with normal WBC last month. With possible pneumonia would de-escalate to Zosyn.        Plan:     1.  Discontinue Ceftaroline    2.  Start IV Zosyn for possible pneumonia   3.  CT Chest w/o  contrast   4.  In am, repeat CBC, procal, check CRP   5.  Follow-up blood cultures             Signed By:  Lorina Rabon, MD           January 04, 2021

## 2021-01-04 NOTE — Progress Notes (Signed)
 Comprehensive Nutrition Assessment    Type and Reason for Visit: (P) Initial (Low BMI)    Nutrition Recommendations/Plan:   Continue current diet and encourage good po intake and fluid intake  Order Ensure Enlive TID, instructed patient to take in between meals.     Malnutrition Assessment:  Malnutrition Status:  Moderate malnutrition (01/04/21 1508)    Context:  Acute illness     Findings of the 6 clinical characteristics of malnutrition:   Energy Intake:  75% or less of est energy req for 7 or more days  Weight Loss:  5% over one month     Body Fat Loss:  Unable to assess,     Muscle Mass Loss:  Unable to assess,    Fluid Accumulation:  Unable to assess,    Grip Strength:  Not performed     Nutrition Assessment:    (P) Patient admitted d/t fall at home.  CT of head unremarkable.  WBC elevated, sepsis criteria meet and patient given fluids.  Antibiotic given for presumed UTI.  Patient endorses gradual decrease in po intake since husband passed away and weight loss of about 12# in the past 2 years.  Patient states that she does eat well here if I like it, I eat it.  She does take Ensure at home and would like to receive here.  Labs Gluc 118.  Meds: Lipitor , protonix     Nutrition Related Findings:    (P) Patient denies chewing or swallowing difficulties despite missing some bottom teeth.  Denies gastrointestinal issues.  No edema reported.  No BM recorded. Wound Type: (P) None    Current Nutrition Intake & Therapies:  Average Meal Intake: (P) 51-75%  Average Supplement Intake: (P) None ordered  ADULT DIET Regular    Anthropometric Measures:  Height: (P) 5' (152.4 cm)  Ideal Body Weight (IBW): (P) 100 lbs ((P) 45 kg)  Admission Body Weight: (P) 99 lb 12.7 oz  Current Body Wt:  (P) 43.4 kg (95 lb 11.2 oz), (P) 95.7 % IBW. (P) Bed scale  Current BMI (kg/m2): (P) 18.7  Usual Body Weight: (P) 45.4 kg (100 lb)  % Weight Change (Calculated): (P) -4.3  Weight Adjustment: (P) No adjustment                 BMI Category: (P)  Underweight (BMI less than 22) age over 37    Estimated Daily Nutrient Needs:  Energy Requirements Based On: (P) Kcal/kg  Weight Used for Energy Requirements: (P) Current  Energy (kcal/day): (P) 1300-1500 kcals/day (30-35 kcals/kg)  Weight Used for Protein Requirements: (P) Current  Protein (g/day): (P) 44-52 grams/day (1.0-1.2 grams/kg)  Method Used for Fluid Requirements: (P) 1 ml/kcal  Fluid (ml/day): (P) 1300-1500 mls day (30-35 mls/kg)    Nutrition Diagnosis:   (P) Moderate malnutrition, Underweight, Unintended weight loss related to (P) inadequate protein-energy intake as evidenced by (P) intake 51-75%, weight loss greater than or equal to 5% in 1 month    Nutrition Interventions:   Food and/or Nutrient Delivery: (P) Continue current diet, Start oral nutrition supplement  Nutrition Education/Counseling: (P) No recommendations at this time  Coordination of Nutrition Care: (P) Continue to monitor while inpatient  Plan of Care discussed with: (P) RN    Goals:     Goals: (P) PO intake 75% or greater       Nutrition Monitoring and Evaluation:   Behavioral-Environmental Outcomes: (P) None identified  Food/Nutrient Intake Outcomes: (P) Food and nutrient intake, Supplement intake  Physical  Signs/Symptoms Outcomes: (P) Weight, Meal time behavior    Discharge Planning:    (P) Too soon to determine    Powell KANDICE Dines, RD  Contact: [phone]

## 2021-01-04 NOTE — Progress Notes (Signed)
Bedside and Verbal shift change report given to Tiffany LPN (oncoming nurse) by Mickie Bail LPN (offgoing nurse). Report included the following information SBAR, Kardex, Procedure Summary, Intake/Output, MAR, and Accordion.

## 2021-01-04 NOTE — Progress Notes (Signed)
Problem: Falls - Risk of  Goal: *Absence of Falls  Description: Document Bridgette Habermann Fall Risk and appropriate interventions in the flowsheet.  Note: Fall Risk Interventions:  Mobility Interventions: Bed/chair exit alarm         Medication Interventions: Patient to call before getting OOB         History of Falls Interventions: Bed/chair exit alarm, Room close to nurse's station

## 2021-01-04 NOTE — Consults (Signed)
MRI reviewed and ortho consulted for thoracic myelopathy

## 2021-01-04 NOTE — Progress Notes (Signed)
Hospitalist Progress Note    Subjective:   Daily Progress Note: 01/04/2021 7:55 AM    Hospital Course: Patient58 y.o. female with history of COPD stable on treatment maintenance inhalers, hyperlipidemia, history of recurrent falls with generalized weakness suspected of cardiac syncope presents to the emergency room complaining of recurrent falls.  Last 3 days she was falling and injuring herself and she does not know how she is falling.  Admits dizziness with ambulation.  Denies any chest pain or palpitations.  Denies any fever or chills.  Admits appetite is decreased and not eating well.      Evaluation in the emergency room significant leukocytosis with left shift, negative evidence of infection, chest x-ray did not show any pneumonia.  Urine analysis is clear, skin with no wounds.  Negative for COVID-19, influenza.  She seems to have infection but source is unclear at this time. ID consulted.  Inflammatory markers elevated.  Started on ceftaroline     Neurology consulted.  CT of the head and cervical spine unremarkable.  Per the neurology note no further work-up warranted.  Recommend PT and OT evaluation.  Patient most likely will need placement to rehab.  Orthostatics pending      Subjective: Patient says that she feels good today.  Has no specific complaints    Current Facility-Administered Medications   Medication Dose Route Frequency    enoxaparin (LOVENOX) injection 30 mg  30 mg SubCUTAneous DAILY    cefTARoline (TEFLARO) 400 mg in 0.9% sodium chloride (MBP/ADV) 50 mL MBP  400 mg IntraVENous Q12H    sodium chloride (NS) flush 5-10 mL  5-10 mL IntraVENous PRN    albuterol (PROVENTIL HFA, VENTOLIN HFA, PROAIR HFA) inhaler 2 Puff  2 Puff Inhalation Q4H PRN    pantoprazole (PROTONIX) tablet 40 mg  40 mg Oral DAILY    atorvastatin (LIPITOR) tablet 40 mg  40 mg Oral DAILY    metoprolol succinate (TOPROL-XL) XL tablet 25 mg  25 mg Oral DAILY    budesonide-formoteroL (SYMBICORT) 160-4.5 mcg/actuation HFA inhaler 2  Puff  2 Puff Inhalation BID RT    And    tiotropium bromide (SPIRIVA RESPIMAT) 2.5 mcg /actuation  2 Puff Inhalation DAILY    sodium chloride (NS) flush 5-40 mL  5-40 mL IntraVENous Q8H    sodium chloride (NS) flush 5-40 mL  5-40 mL IntraVENous PRN    acetaminophen (TYLENOL) tablet 650 mg  650 mg Oral Q6H PRN    Or    acetaminophen (TYLENOL) suppository 650 mg  650 mg Rectal Q6H PRN    ondansetron (ZOFRAN ODT) tablet 4 mg  4 mg Oral Q6H PRN    Or    ondansetron (ZOFRAN) injection 4 mg  4 mg IntraVENous Q6H PRN        Review of Systems  Constitutional: No fevers, No chills, No sweats, No fatigue, +Weakness  Eyes: No redness  Ears, nose, mouth, throat, and face: No nasal congestion, No sore throat, No voice change  Respiratory: No Shortness of Breath, No cough, No wheezing  Cardiovascular: No chest pain, No palpitations, No extremity edema  Gastrointestinal: No nausea, No vomiting, No diarrhea, No abdominal pain  Genitourinary: No frequency, No dysuria, No hematuria  Integument/breast: No skin lesion(s)   Neurological: No Confusion, No headaches, No dizziness      Objective:     Visit Vitals  BP 124/82 (BP 1 Location: Right upper arm, BP Patient Position: At rest;Supine)   Pulse 81   Temp 97.5 ??F (  36.4 ??C)   Resp 20   Ht 5' (1.524 m)   Wt 43.5 kg (95 lb 14.4 oz)   SpO2 96%   Breastfeeding No   BMI 18.73 kg/m??      O2 Device: None (Room air)    Temp (24hrs), Avg:97.5 ??F (36.4 ??C), Min:97.2 ??F (36.2 ??C), Max:97.8 ??F (36.6 ??C)      No intake/output data recorded.  12/11 1901 - 12/13 0700  In: 50 [I.V.:50]  Out: -     PHYSICAL EXAM:  Constitutional: No acute distress  Skin: Extremities and face reveal no rashes.   HEENT: Sclerae anicteric. Extra-occular muscles are intact. No oral ulcers. The neck is supple and no masses.   Cardiovascular: Regular rate and rhythm.   Respiratory:  Clear breath sounds bilaterally with no wheezes, rales, or rhonchi.   GI: Abdomen nondistended, soft, and nontender. Normal active bowel  sounds.  Musculoskeletal: No pitting edema of the lower legs. Able to move all ext  Neurological:  Patient is alert and oriented. Cranial nerves II-XII grossly intact  Psychiatric: Mood appears appropriate       Data Review    Recent Results (from the past 24 hour(s))   C REACTIVE PROTEIN, QT    Collection Time: 01/04/21  7:22 AM   Result Value Ref Range    C-Reactive protein 16.50 (H) 0.00 - 0.60 mg/dL       Radiology review: CT of head and C spine    Assessment:   1.  Sepsis syndrome etiology unclear  2.  Gait disturbance with recurrent fall/injury  3.  Hypertension  4.  COPD without exacerbation  5.  Hyperlipidemia    Plan:   1.  WBC is trending down.  Inflammatory markers elevated.  Unclear source of infection.  Urinalysis unremarkable.  Blood cultures and urine cultures pending.  Rapid COVID and influenza negative.  Infectious disease consulted.  On Ceftaroline   2.  CT of the head and cervical spine unremarkable.  Neurology consulted.  Per them no further work-up warranted.  PT and OT evaluation.  Patient most likely will require placement.  Orthostatics.  3.  Blood pressure stable.  Continue with metoprolol  4.  On Symbicort.  Oxygen saturation within normal limits on room air  5.  Continue atorvastatin  6.  CBC, BMP, inflammatory markers in a.m.    Dispo: 48 hours.  Barriers include cultures resulting, WBC trending down, PT and OT evaluation, case management for discharge planning.  Most likely will require placement    CODE STATUS full     DVT prophylaxis: Lovenox  Ulcer prophylaxis: Protonix    Care Plan discussed with: Patient/Family, Nurse, and Case Manager    Total time spent with patient: 34 minutes.

## 2021-01-04 NOTE — Progress Notes (Signed)
Attempted to see pt for PT eval; however, MRI results just came in and thoracic MRI shows:  Subacute-appearing severe T10 compression fracture with osseous  retropulsion. Moderate to severe spinal cord compression with findings of subtle  compressive myelopathy at this level. Associated T9-T11 Mild-to-moderate spinal  canal and neuroforaminal stenoses.  Will continue to follow and await neurology to clear pt for PT.

## 2021-01-04 NOTE — Progress Notes (Signed)
 Progress Notes by OLIVA TORIBIO Pastor at 01/04/21 1849                Author: OLIVA TORIBIO Pastor  Service: Pharmacist  Author Type: Pharmacist       Filed: 01/04/21 1849  Date of Service: 01/04/21 1849  Status: Signed          Editor: OLIVA TORIBIO Pastor (Pharmacist)                    Pharmacy Note - Zosyn       3375 mg Zosyn  IVPB q 8 h (4 hr infusion) ordered for treatment of Pneumonia (HAP). Per [facility] Extended Infusion B Lactam Policy, Zosyn  will be changed to 4500 mg IVPB x 1 over 30 min with 3375 mg Zosyn  IVPB q 8 h (4 hr infusion) to follow in 6 hours.       Estimated Creatinine Clearance: Estimated Creatinine Clearance: 44.6 mL/min (based on SCr of 0.76 mg/dL).   Dialysis Status, AKI, CKD: n/a     BMI:  Body mass index is 18.73 kg/m.        Recent Labs             01/04/21   0722  01/03/21   0408  01/02/21   1836          WBC  16.5*  21.8*  22.5*        Temp (24hrs), Avg:97.4 F (36.3 C), Min:97.2 F (36.2 C), Max:97.6 F (36.4 C)         Rationale for Adjustment:  Extended infusion dosing strategy aims to enhance microbiology and clinical efficacy.      Pharmacy will continue to monitor and adjust dose as necessary.        Please call Inpatient Pharmacy with any questions.      Thank you,   TORIBIO Pastor [facility] MS R.Ph

## 2021-01-04 NOTE — Progress Notes (Signed)
Patient orthostatic blood pressure and heart rate    Laying  104/68 HR 98    Sitting  120/80  Heart rate 108    Standing  109/69   Heart rate 111

## 2021-01-04 NOTE — Progress Notes (Signed)
 OT eval order received and acknowledged, attempted at 1435 however per MRI of spine on 12/13, subacute-appearing T10 severe biconcave compression fracture with at least 80% height loss centrally and up to 7 mm osseous retropulsion. Moderate to severe spinal cord compression with subtle increased T2/STIR signal at this level. Will hold therapy at this time, until Neuro has consulted following MRI. Will continue to follow pt and attempt OT eval at a later time as medically appropriate. Thank you.

## 2021-01-05 ENCOUNTER — Inpatient Hospital Stay: Admit: 2021-01-05 | Payer: MEDICARE | Primary: Internal Medicine

## 2021-01-05 LAB — METABOLIC PANEL, BASIC
Anion gap: 7 mmol/L (ref 5–15)
BUN/Creatinine ratio: 25 — ABNORMAL HIGH (ref 12–20)
BUN: 21 mg/dL — ABNORMAL HIGH (ref 6–20)
CO2: 22 mmol/L (ref 21–32)
Calcium: 9 mg/dL (ref 8.5–10.1)
Chloride: 112 mmol/L — ABNORMAL HIGH (ref 97–108)
Creatinine: 0.83 mg/dL (ref 0.55–1.02)
Glucose: 100 mg/dL (ref 65–100)
Potassium: 3.7 mmol/L (ref 3.5–5.1)
Sodium: 141 mmol/L (ref 136–145)
eGFR: >60 mL/min/{1.73_m2} (ref >60)

## 2021-01-05 LAB — CBC WITH AUTOMATED DIFF
ABS. BASOPHILS: 0 10*3/uL (ref 0.0–0.1)
ABS. EOSINOPHILS: 0.4 10*3/uL (ref 0.0–0.4)
ABS. IMM. GRANS.: 0.1 10*3/uL — ABNORMAL HIGH (ref 0.00–0.04)
ABS. LYMPHOCYTES: 2.5 10*3/uL (ref 0.8–3.5)
ABS. MONOCYTES: 0.6 10*3/uL (ref 0.0–1.0)
ABS. NEUTROPHILS: 8.7 10*3/uL — ABNORMAL HIGH (ref 1.8–8.0)
ABSOLUTE NRBC: 0 10*3/uL (ref 0.00–0.01)
BASOPHILS: 0 % (ref 0–1)
EOSINOPHILS: 3 % (ref 0–7)
HCT: 34.2 % — ABNORMAL LOW (ref 35.0–47.0)
HGB: 11 g/dL — ABNORMAL LOW (ref 11.5–16.0)
IMMATURE GRANULOCYTES: 1 % — ABNORMAL HIGH (ref 0–0.5)
LYMPHOCYTES: 20 % (ref 12–49)
MCH: 31.5 PG (ref 26.0–34.0)
MCHC: 32.2 g/dL (ref 30.0–36.5)
MCV: 98 FL (ref 80.0–99.0)
MONOCYTES: 5 % (ref 5–13)
MPV: 8.8 FL — ABNORMAL LOW (ref 8.9–12.9)
NEUTROPHILS: 71 % (ref 32–75)
NRBC: 0 PER 100 WBC
PLATELET: 322 10*3/uL (ref 150–400)
RBC: 3.49 M/uL — ABNORMAL LOW (ref 3.80–5.20)
RDW: 13.2 % (ref 11.5–14.5)
WBC: 12.3 10*3/uL — ABNORMAL HIGH (ref 3.6–11.0)

## 2021-01-05 LAB — C REACTIVE PROTEIN, QT: C-Reactive protein: 8.14 mg/dL — ABNORMAL HIGH (ref 0.00–0.60)

## 2021-01-05 LAB — PROCALCITONIN
Procalcitonin: 0.17 ng/mL — ABNORMAL HIGH
Procalcitonin: 0.17 ng/mL — ABNORMAL HIGH

## 2021-01-05 LAB — C-REACTIVE PROTEIN: CRP: 8.14 mg/dL — ABNORMAL HIGH (ref 0.00–0.60)

## 2021-01-05 LAB — CBC WITH AUTO DIFFERENTIAL
Basophils %: 0 % (ref 0–1)
Basophils Absolute: 0 10*3/uL (ref 0.0–0.1)
Eosinophils %: 3 % (ref 0–7)
Eosinophils Absolute: 0.4 10*3/uL (ref 0.0–0.4)
Granulocyte Absolute Count: 0.1 10*3/uL — ABNORMAL HIGH (ref 0.00–0.04)
Hematocrit: 34.2 % — ABNORMAL LOW (ref 35.0–47.0)
Hemoglobin: 11 g/dL — ABNORMAL LOW (ref 11.5–16.0)
Immature Granulocytes: 1 % — ABNORMAL HIGH (ref 0–0.5)
Lymphocytes %: 20 % (ref 12–49)
Lymphocytes Absolute: 2.5 10*3/uL (ref 0.8–3.5)
MCH: 31.5 PG (ref 26.0–34.0)
MCHC: 32.2 g/dL (ref 30.0–36.5)
MCV: 98 FL (ref 80.0–99.0)
MPV: 8.8 FL — ABNORMAL LOW (ref 8.9–12.9)
Monocytes %: 5 % (ref 5–13)
Monocytes Absolute: 0.6 10*3/uL (ref 0.0–1.0)
NRBC Absolute: 0 10*3/uL (ref 0.00–0.01)
Neutrophils %: 71 % (ref 32–75)
Neutrophils Absolute: 8.7 10*3/uL — ABNORMAL HIGH (ref 1.8–8.0)
Nucleated RBCs: 0 PER 100 WBC
Platelets: 322 10*3/uL (ref 150–400)
RBC: 3.49 M/uL — ABNORMAL LOW (ref 3.80–5.20)
RDW: 13.2 % (ref 11.5–14.5)
WBC: 12.3 10*3/uL — ABNORMAL HIGH (ref 3.6–11.0)

## 2021-01-05 LAB — BASIC METABOLIC PANEL
Anion Gap: 7 mmol/L (ref 5–15)
BUN: 21 mg/dL — ABNORMAL HIGH (ref 6–20)
Bun/Cre Ratio: 25 — ABNORMAL HIGH (ref 12–20)
CO2: 22 mmol/L (ref 21–32)
Calcium: 9 mg/dL (ref 8.5–10.1)
Chloride: 112 mmol/L — ABNORMAL HIGH (ref 97–108)
Creatinine: 0.83 mg/dL (ref 0.55–1.02)
ESTIMATED GLOMERULAR FILTRATION RATE: >60 mL/min/{1.73_m2} (ref >60)
Glucose: 100 mg/dL (ref 65–100)
Potassium: 3.7 mmol/L (ref 3.5–5.1)
Sodium: 141 mmol/L (ref 136–145)

## 2021-01-05 MED ORDER — TECHNETIUM TC-99M EXAMETAZIME LIPOPHILIC 0.5 MG INTRAVENOUS KIT
0.5 mg | Freq: Once | INTRAVENOUS | Status: AC
Start: 2021-01-05 — End: 2021-01-05
  Administered 2021-01-05: 17:00:00 via INTRAVENOUS

## 2021-01-05 MED FILL — PIPERACILLIN-TAZOBACTAM 3.375 GRAM IV SOLR: 3.375 gram | INTRAVENOUS | Qty: 3.38

## 2021-01-05 MED FILL — SYMBICORT 160 MCG-4.5 MCG/ACTUATION HFA AEROSOL INHALER: RESPIRATORY_TRACT | Qty: 6

## 2021-01-05 MED FILL — SPIRIVA RESPIMAT 2.5 MCG/ACTUATION SOLUTION FOR INHALATION: 2.5 mcg/actuation | RESPIRATORY_TRACT | Qty: 4

## 2021-01-05 MED FILL — PIPERACILLIN-TAZOBACTAM 4.5 GRAM IV SOLR: 4.5 gram | INTRAVENOUS | Qty: 4.5

## 2021-01-05 MED FILL — METOPROLOL SUCCINATE SR 50 MG 24 HR TAB: 50 mg | ORAL | Qty: 1

## 2021-01-05 MED FILL — TYLENOL 325 MG TABLET: 325 mg | ORAL | Qty: 2

## 2021-01-05 MED FILL — ENOXAPARIN 30 MG/0.3 ML SUB-Q SYRINGE: 30 mg/0.3 mL | SUBCUTANEOUS | Qty: 0.3

## 2021-01-05 MED FILL — PANTOPRAZOLE 40 MG TAB, DELAYED RELEASE: 40 mg | ORAL | Qty: 1

## 2021-01-05 MED FILL — ATORVASTATIN 40 MG TAB: 40 mg | ORAL | Qty: 1

## 2021-01-05 NOTE — Progress Notes (Signed)
Hospitalist Progress Note    Subjective:   Daily Progress Note: 01/05/2021 7:55 AM    Hospital Course: Patient89 y.o. female with history of COPD stable on treatment maintenance inhalers, hyperlipidemia, history of recurrent falls with generalized weakness suspected of cardiac syncope presents to the emergency room complaining of recurrent falls.  Last 3 days she was falling and injuring herself and she does not know how she is falling.  Admits dizziness with ambulation.  Denies any chest pain or palpitations.  Denies any fever or chills.  Admits appetite is decreased and not eating well.      Evaluation in the emergency room significant leukocytosis with left shift, negative evidence of infection, chest x-ray did not show any pneumonia.  Urine analysis is clear, skin with no wounds.  Negative for COVID-19, influenza.  She seems to have infection but source is unclear at this time. ID consulted.  Inflammatory markers elevated.  Started on ceftaroline and then transition to IV Zosyn.  CT of the chest showed mild right atelectasis/consolidation with trace right sided fusion with chronic T5, T7, T10 vertebral compression fractures, moderate coronary vascular calcifications nuclear medicine bone scan pending.     Neurology consulted.  CT of the head and cervical spine unremarkable.  Per the neurology note no further work-up warranted.  Recommend PT and OT evaluation.  Patient most likely will need placement to rehab.  Orthostatics positive.  Given a fluid bolus.  MRI of the cervical and thoracic spine showed no acute fracture or subluxation of the cervical spine with multilevel generative changes, disc disease, listlessness contributing to multilevel mild to moderate spinal canal stenosis and varying degrees of neural foraminal stenosis, subacute appearing severe T10 compression fracture with retropulsion, moderate to severe spinal cord compression with findings of subtle compression myopathy at this level, associated  T9-T11 mild to moderate spinal canal and neuroforaminal stenosis, T6-T7 central disc extrusion with mild spinal stenosis, chronic T7 compression deformity.    Subjective: Patient offers no complaints today.  Denies any back pain but says that she occasionally has it.    Current Facility-Administered Medications   Medication Dose Route Frequency    piperacillin-tazobactam (ZOSYN) 3.375 g in 0.9% sodium chloride (MBP/ADV) 100 mL MBP  3.375 g IntraVENous Q8H    piperacillin-tazobactam (ZOSYN) 4.5 g in 0.9% sodium chloride (MBP/ADV) 100 mL MBP  4.5 g IntraVENous ONCE    enoxaparin (LOVENOX) injection 30 mg  30 mg SubCUTAneous DAILY    sodium chloride (NS) flush 5-10 mL  5-10 mL IntraVENous PRN    albuterol (PROVENTIL HFA, VENTOLIN HFA, PROAIR HFA) inhaler 2 Puff  2 Puff Inhalation Q4H PRN    pantoprazole (PROTONIX) tablet 40 mg  40 mg Oral DAILY    atorvastatin (LIPITOR) tablet 40 mg  40 mg Oral DAILY    metoprolol succinate (TOPROL-XL) XL tablet 25 mg  25 mg Oral DAILY    budesonide-formoteroL (SYMBICORT) 160-4.5 mcg/actuation HFA inhaler 2 Puff  2 Puff Inhalation BID RT    And    tiotropium bromide (SPIRIVA RESPIMAT) 2.5 mcg /actuation  2 Puff Inhalation DAILY    sodium chloride (NS) flush 5-40 mL  5-40 mL IntraVENous Q8H    sodium chloride (NS) flush 5-40 mL  5-40 mL IntraVENous PRN    acetaminophen (TYLENOL) tablet 650 mg  650 mg Oral Q6H PRN    ondansetron (ZOFRAN ODT) tablet 4 mg  4 mg Oral Q6H PRN    Or    ondansetron (ZOFRAN) injection 4 mg  4  mg IntraVENous Q6H PRN        Review of Systems  Constitutional: No fevers, No chills, No sweats, No fatigue, +Weakness  Eyes: No redness  Ears, nose, mouth, throat, and face: No nasal congestion, No sore throat, No voice change  Respiratory: No Shortness of Breath, No cough, No wheezing  Cardiovascular: No chest pain, No palpitations, No extremity edema  Gastrointestinal: No nausea, No vomiting, No diarrhea, No abdominal pain  Genitourinary: No frequency, No dysuria, No  hematuria  Integument/breast: No skin lesion(s)   Neurological: No Confusion, No headaches, No dizziness      Objective:     Visit Vitals  BP 118/80 (BP 1 Location: Left upper arm, BP Patient Position: At rest)   Pulse 89   Temp 98.6 ??F (37 ??C)   Resp 18   Ht 5' (1.524 m)   Wt 43.5 kg (95 lb 14.4 oz)   SpO2 93%   Breastfeeding No   BMI 18.73 kg/m??      O2 Device: None (Room air)    Temp (24hrs), Avg:97.7 ??F (36.5 ??C), Min:97 ??F (36.1 ??C), Max:98.6 ??F (37 ??C)      No intake/output data recorded.  12/12 1901 - 12/14 0700  In: 50 [I.V.:50]  Out: -     PHYSICAL EXAM:  Constitutional: No acute distress  Skin: Extremities and face reveal no rashes.   HEENT: Sclerae anicteric. Extra-occular muscles are intact. No oral ulcers. The neck is supple and no masses.   Cardiovascular: RRR  Respiratory:  Clear breath sounds bilaterally with no wheezes, rales, or rhonchi.   GI: Abdomen nondistended, soft, and nontender. Normal active bowel sounds.  Musculoskeletal: No pitting edema of the lower legs. Able to move all ext  Neurological:  Patient is alert and oriented. Cranial nerves II-XII grossly intact  Psychiatric: Mood appears appropriate       Data Review    No results found for this or any previous visit (from the past 24 hour(s)).      Radiology review: CT of head and C spine    Assessment:   1.  Sepsis syndrome etiology unclear  2.  Gait disturbance with recurrent fall/injury with spinal cord compression, multiple vertebral compression fractures, thoracic myelopathy  3.  Hypertension  4.  COPD without exacerbation  5.  Hyperlipidemia    Plan:   1.  WBC is trending down.  Inflammatory markers elevated.  Unclear source of infection.  Urinalysis unremarkable.  Blood cultures and urine cultures pending.  Rapid COVID and influenza negative.  Infectious disease consulted.  CT of the chest reviewed.  Switch to IV Zosyn.  Nuc med bone scan pending  2.  CT of the head and cervical spine unremarkable.  Neurology consulted.  MRI of the  and thoracic spine severely abnormal with spinal cord compression, compression fractures, thoracic myelopathy.  Neurosurgery consulted.  PT and OT evaluation.  Patient most likely will require placement.  Orthostatics.  3.  Blood pressure stable.  Continue with metoprolol  4.  On Symbicort.  Oxygen saturation within normal limits on room air  5.  Continue atorvastatin  6.  CBC, BMP, inflammatory markers in a.m.    Dispo: 48 hours.  Barriers include cultures resulting, WBC trending down, PT and OT evaluation, ortho consult, and case management for discharge planning.  Most likely will require placement    CODE STATUS full     DVT prophylaxis: Lovenox  Ulcer prophylaxis: Protonix    Care Plan discussed with: Patient/Family,  Nurse, and Case Manager    Total time spent with patient: 33 minutes.

## 2021-01-05 NOTE — Progress Notes (Signed)
Bedside shift change report given to Kristen,RN (Cabin crew) by Rocco Pauls (offgoing nurse). Report included the following information SBAR.

## 2021-01-05 NOTE — Progress Notes (Signed)
PT eval order received and acknowledged. Per chart review orthopedic consult placed by neurology for thoracic myelopathy, will hold PT evaluation at this time pending orthopedic clearance. Thank you.

## 2021-01-05 NOTE — Progress Notes (Signed)
DC Plan: Disposition pending    Pt was accepted with [facility]. CM to f/up on PT/OT rec.

## 2021-01-05 NOTE — Progress Notes (Signed)
Physician Progress Note      PATIENT:               [name], [name]  CSN #:                  [id number]  DOB:                       1945/06/06  ADMIT DATE:       01/02/2021 6:18 PM  DISCH DATE:  RESPONDING  PROVIDER #:        Tonye Pearson PA-C Margorie Renner PA-C          QUERY TEXT:    Pt admitted with recurrent falls.  Noted documentation of Moderate malnutrition on 12/13 Dietician consult note.  If possible, please document in progress notes and discharge summary:      The medical record reflects the following:  Risk Factors: 75 year old female, decrease in PO intake, weight loss, BMI 18.7, Albumin 3.0  Clinical Indicators: 12/13 Dietician consult - Moderate malnutrition, Acute illness  Energy Intake:  75% or less of est energy req for 7 or more days  Weight Loss:  5% over one month  Body Fat Loss:  Unable to assess,  Muscle Mass Loss:  Unable to assess,  Fluid Accumulation:  Unable to assess  Treatment: 1.Continue current diet and encourage good po intake and fluid intake  2.Order Ensure Enlive TID, instructed patient to take in between meals.      Please email [email or web address] with any questions  Options provided:  -- moderate malnutrition confirmed present on admission  -- moderate malnutrition ruled out  -- Other - I will add my own diagnosis  -- Disagree - Not applicable / Not valid  -- Disagree - Clinically unable to determine / Unknown  -- Refer to Clinical Documentation Reviewer    PROVIDER RESPONSE TEXT:    The diagnosis of moderate malnutrition was confirmed as present on admission.    Query created by: Jill Alexanders on 01/05/2021 12:01 PM      Electronically signed by:  Juliane Poot Elienai Gailey PA-C 01/05/2021 2:45 PM

## 2021-01-05 NOTE — Progress Notes (Signed)
Pt unable to receive antibiotics . Pt has no IV access.

## 2021-01-05 NOTE — Progress Notes (Signed)
Problem: Falls - Risk of  Goal: *Absence of Falls  Description: Document [name] Fall Risk and appropriate interventions in the flowsheet.  Outcome: Progressing Towards Goal  Note: Fall Risk Interventions:  Mobility Interventions: Bed/chair exit alarm         Medication Interventions: Bed/chair exit alarm, Teach patient to arise slowly    Elimination Interventions: Call light in reach, Bed/chair exit alarm    History of Falls Interventions: Bed/chair exit alarm         Problem: Patient Education: Go to Patient Education Activity  Goal: Patient/Family Education  Outcome: Progressing Towards Goal     Problem: Pressure Injury - Risk of  Goal: *Prevention of pressure injury  Description: Document Braden Scale and appropriate interventions in the flowsheet.  Outcome: Progressing Towards Goal  Note: Pressure Injury Interventions:  Sensory Interventions: Assess need for specialty bed, Discuss PT/OT consult with provider, Minimize linen layers, Maintain/enhance activity level, Keep linens dry and wrinkle-free    Moisture Interventions: Absorbent underpads, Internal/External urinary devices, Minimize layers    Activity Interventions: PT/OT evaluation, Increase time out of bed    Mobility Interventions: HOB 30 degrees or less, PT/OT evaluation    Nutrition Interventions: Document food/fluid/supplement intake    Friction and Shear Interventions: Apply protective barrier, creams and emollients, Minimize layers, HOB 30 degrees or less

## 2021-01-05 NOTE — Progress Notes (Signed)
Progress Notes by Lorina Rabon, MD at 01/05/21 1202                Author: Lorina Rabon, MD  Service: --  Author Type: Physician       Filed: 01/05/21 1406  Date of Service: 01/05/21 1202  Status: Signed          Editor: Lorina Rabon, MD (Physician)                             Progress Note          Patient: [name]  MRN: [id number]   SSN: [id number]          Date of Birth: 11-04-1945   Age: 75 y.o.   Sex: female         Admit Date: 01/02/2021     LOS: 3 days         Subjective:     Patient with concern for sepsis, however, no obvious source of infection..  She remains afebrile with decreasing WBC and procal.  CRP also elevated. MRI ordered to rule out cord compression but showed abnormalities suggestive  of pneumonia. Dedicated CT Chest showed RLL consolidation. Ceretec scan pending.  Patient resting comfortably with no new complaints.         Objective:          Vitals:             01/04/21 2029  01/05/21 0313  01/05/21 0745  01/05/21 0803           BP:  122/80  118/80    (!) 151/86     Pulse:  95  89    80     Resp:  20  18    18      Temp:  97 ??F (36.1 ??C)  98.6 ??F (37 ??C)    97.7 ??F (36.5 ??C)     SpO2:  94%  93%  94%  94%     Weight:                   Height:                    Intake and Output:   Current Shift: No intake/output data recorded.   Last three shifts: 12/12 1901 - 12/14 0700   In: 50 [I.V.:50]   Out: -       Physical Exam:    Vitals and nursing note reviewed.    Constitutional:        Appearance: She is not ill-appearing.    HENT:       Head: Normocephalic and atraumatic.         Comments: Flesh colored swelling left forehead, nontender, no erythema, no ecchymoses      Right Ear: External ear normal.       Left Ear: External ear normal.       Mouth/Throat:       Mouth: Mucous membranes are moist.    Eyes:       Pupils: Pupils are equal, round, and reactive to light.    Cardiovascular:       Rate and Rhythm: Normal rate and regular rhythm.    Pulmonary:       Effort:  Pulmonary effort is normal.       Breath sounds: Normal breath sounds.    Abdominal:       General: Bowel  sounds are normal. There is no distension.       Palpations: Abdomen is soft.       Tenderness: There is no abdominal tenderness.    Musculoskeletal:       Cervical back: Neck supple.       Right lower leg: No edema.       Left lower leg: No edema.    Skin:      Findings: No lesion or rash.    Neurological:       General: No focal deficit present.       Mental Status: She is alert and oriented to person, place, and time.    Psychiatric:          Behavior: Behavior normal.       Lab/Data Review:       WBC 12,300      CRP 8.14 <16.50    Procal 0.17 <0.23 <0.35      Blood cultures (12/11) No growth 3 days   Urine culture (12/11) No growth FINAL      MRI Thoracic spine w/o contrast (12/13)   1.  Subacute-appearing severe T10 compression fracture with osseous   retropulsion. 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 Associated T9-T11 Mild-to-moderate spinal   canal and neuroforaminal stenoses.   2.  T6-T7 Central disc extrusion with mild spinal stenosis at this level.   3.  Chronic T7 compression deformity.   4.  Scattered signal abnormalities in the bilateral lungs and a small right   pleural effusion may reflect focal pneumonia. Dedicated chest CT recommended for   further characterization.              CT Chest (12/13)  Minimal right basilar atelectasis/consolidation with trace right-sided effusion.   Chronic T5, T7 and T10 vertebral compression fractures. Moderate coronary vascular calcifications.           Ceretec scan (12/13)         Assessment:        Principal Problem:     Sepsis (HCC) (01/02/2021)   1.  Presumptive sepsis with leukocytosis, elevated CRP and procal,  etiology  unclear, resolving Day #4 IV antibiotics, now Zosyn   2.  Possible RLL pneumonia   3.  Soft tissue swelling left forehead, no signs of infection   4.  Frequent falls with  dizziness   5.  Babinksi and hyperreflexia (per Neurology), ruling out cord compression   6.  Possible pneumonia (per MRI)   7.     Comment: Sepsis parameters improving with source presumed to be RLL pneumonia, despite absence or paucity of respiratory symptoms.         Plan:     1.  Continue IV Zosyn for possible pneumonia; can probably transition to Augmentin 875 mg BID for 10 days in the next 24-48 hours   2.  In am, repeat CBC, procal,  CRP   3.  Follow-up blood cultures   4.  Follow-up Ceretec scan             Signed By:  Lorina Rabon, MD           January 05, 2021

## 2021-01-05 NOTE — Progress Notes (Signed)
OT eval order received and acknowledged. Per chart review, pt is pending ortho consult d/t thoracic myelopathy. OT will hold pending ortho clearance. Thank you.

## 2021-01-06 LAB — METABOLIC PANEL, BASIC
Anion gap: 6 mmol/L (ref 5–15)
BUN/Creatinine ratio: 23 — ABNORMAL HIGH (ref 12–20)
BUN: 18 mg/dL (ref 6–20)
CO2: 22 mmol/L (ref 21–32)
Calcium: 8.8 mg/dL (ref 8.5–10.1)
Chloride: 111 mmol/L — ABNORMAL HIGH (ref 97–108)
Creatinine: 0.79 mg/dL (ref 0.55–1.02)
Glucose: 96 mg/dL (ref 65–100)
Potassium: 3.8 mmol/L (ref 3.5–5.1)
Sodium: 139 mmol/L (ref 136–145)
eGFR: >60 mL/min/{1.73_m2} (ref >60)

## 2021-01-06 LAB — CBC WITH AUTOMATED DIFF
ABS. BASOPHILS: 0.1 10*3/uL (ref 0.0–0.1)
ABS. EOSINOPHILS: 0.4 10*3/uL (ref 0.0–0.4)
ABS. IMM. GRANS.: 0.2 10*3/uL — ABNORMAL HIGH (ref 0.00–0.04)
ABS. LYMPHOCYTES: 2.2 10*3/uL (ref 0.8–3.5)
ABS. MONOCYTES: 0.9 10*3/uL (ref 0.0–1.0)
ABS. NEUTROPHILS: 7.6 10*3/uL (ref 1.8–8.0)
ABSOLUTE NRBC: 0 10*3/uL (ref 0.00–0.01)
BASOPHILS: 0 % (ref 0–1)
EOSINOPHILS: 4 % (ref 0–7)
HCT: 31.3 % — ABNORMAL LOW (ref 35.0–47.0)
HGB: 10 g/dL — ABNORMAL LOW (ref 11.5–16.0)
IMMATURE GRANULOCYTES: 2 % — ABNORMAL HIGH (ref 0–0.5)
LYMPHOCYTES: 20 % (ref 12–49)
MCH: 31.2 PG (ref 26.0–34.0)
MCHC: 31.9 g/dL (ref 30.0–36.5)
MCV: 97.5 FL (ref 80.0–99.0)
MONOCYTES: 8 % (ref 5–13)
MPV: 8.8 FL — ABNORMAL LOW (ref 8.9–12.9)
NEUTROPHILS: 66 % (ref 32–75)
NRBC: 0 PER 100 WBC
PLATELET: 298 10*3/uL (ref 150–400)
RBC: 3.21 M/uL — ABNORMAL LOW (ref 3.80–5.20)
RDW: 13 % (ref 11.5–14.5)
WBC: 11.4 10*3/uL — ABNORMAL HIGH (ref 3.6–11.0)

## 2021-01-06 LAB — C REACTIVE PROTEIN, QT: C-Reactive protein: 4.62 mg/dL — ABNORMAL HIGH (ref 0.00–0.60)

## 2021-01-06 LAB — PROCALCITONIN
Procalcitonin: 0.07 ng/mL — ABNORMAL HIGH
Procalcitonin: 0.07 ng/mL — ABNORMAL HIGH

## 2021-01-06 LAB — CBC WITH AUTO DIFFERENTIAL
Basophils %: 0 % (ref 0–1)
Basophils Absolute: 0.1 10*3/uL (ref 0.0–0.1)
Eosinophils %: 4 % (ref 0–7)
Eosinophils Absolute: 0.4 10*3/uL (ref 0.0–0.4)
Granulocyte Absolute Count: 0.2 10*3/uL — ABNORMAL HIGH (ref 0.00–0.04)
Hematocrit: 31.3 % — ABNORMAL LOW (ref 35.0–47.0)
Hemoglobin: 10 g/dL — ABNORMAL LOW (ref 11.5–16.0)
Immature Granulocytes: 2 % — ABNORMAL HIGH (ref 0–0.5)
Lymphocytes %: 20 % (ref 12–49)
Lymphocytes Absolute: 2.2 10*3/uL (ref 0.8–3.5)
MCH: 31.2 PG (ref 26.0–34.0)
MCHC: 31.9 g/dL (ref 30.0–36.5)
MCV: 97.5 FL (ref 80.0–99.0)
MPV: 8.8 FL — ABNORMAL LOW (ref 8.9–12.9)
Monocytes %: 8 % (ref 5–13)
Monocytes Absolute: 0.9 10*3/uL (ref 0.0–1.0)
NRBC Absolute: 0 10*3/uL (ref 0.00–0.01)
Neutrophils %: 66 % (ref 32–75)
Neutrophils Absolute: 7.6 10*3/uL (ref 1.8–8.0)
Nucleated RBCs: 0 PER 100 WBC
Platelets: 298 10*3/uL (ref 150–400)
RBC: 3.21 M/uL — ABNORMAL LOW (ref 3.80–5.20)
RDW: 13 % (ref 11.5–14.5)
WBC: 11.4 10*3/uL — ABNORMAL HIGH (ref 3.6–11.0)

## 2021-01-06 LAB — BASIC METABOLIC PANEL
Anion Gap: 6 mmol/L (ref 5–15)
BUN: 18 mg/dL (ref 6–20)
Bun/Cre Ratio: 23 — ABNORMAL HIGH (ref 12–20)
CO2: 22 mmol/L (ref 21–32)
Calcium: 8.8 mg/dL (ref 8.5–10.1)
Chloride: 111 mmol/L — ABNORMAL HIGH (ref 97–108)
Creatinine: 0.79 mg/dL (ref 0.55–1.02)
ESTIMATED GLOMERULAR FILTRATION RATE: >60 mL/min/{1.73_m2} (ref >60)
Glucose: 96 mg/dL (ref 65–100)
Potassium: 3.8 mmol/L (ref 3.5–5.1)
Sodium: 139 mmol/L (ref 136–145)

## 2021-01-06 LAB — C-REACTIVE PROTEIN: CRP: 4.62 mg/dL — ABNORMAL HIGH (ref 0.00–0.60)

## 2021-01-06 MED ORDER — AMITRIPTYLINE 25 MG TAB
25 mg | Freq: Every evening | ORAL | Status: AC
Start: 2021-01-06 — End: 2021-01-10
  Administered 2021-01-07 – 2021-01-10 (×4): via ORAL

## 2021-01-06 MED FILL — TYLENOL 325 MG TABLET: 325 mg | ORAL | Qty: 2

## 2021-01-06 MED FILL — SPIRIVA RESPIMAT 2.5 MCG/ACTUATION SOLUTION FOR INHALATION: 2.5 mcg/actuation | RESPIRATORY_TRACT | Qty: 4

## 2021-01-06 MED FILL — PANTOPRAZOLE 40 MG TAB, DELAYED RELEASE: 40 mg | ORAL | Qty: 1

## 2021-01-06 MED FILL — PIPERACILLIN-TAZOBACTAM 3.375 GRAM IV SOLR: 3.375 gram | INTRAVENOUS | Qty: 3.38

## 2021-01-06 MED FILL — ENOXAPARIN 30 MG/0.3 ML SUB-Q SYRINGE: 30 mg/0.3 mL | SUBCUTANEOUS | Qty: 0.3

## 2021-01-06 MED FILL — ATORVASTATIN 40 MG TAB: 40 mg | ORAL | Qty: 1

## 2021-01-06 MED FILL — SYMBICORT 160 MCG-4.5 MCG/ACTUATION HFA AEROSOL INHALER: RESPIRATORY_TRACT | Qty: 6

## 2021-01-06 MED FILL — METOPROLOL SUCCINATE SR 50 MG 24 HR TAB: 50 mg | ORAL | Qty: 1

## 2021-01-06 NOTE — Progress Notes (Signed)
 Problem: Mobility Impaired (Adult and Pediatric)  Goal: *Acute Goals and Plan of Care (Insert Text)  Description: FUNCTIONAL STATUS PRIOR TO ADMISSION: Patient was modified independent using a rolling walker for functional mobility. Per pt report, she had 5 falls in past 3 months.    HOME SUPPORT PRIOR TO ADMISSION: The patient lived alone with neighbor to provide assistance.    Physical Therapy Goals  Initiated 01/06/2021     Patient/family stated goal: I want to go home  1.  Patient will move from supine to sit and sit to supine  in bed with independence within 7 day(s).    2.  Patient will transfer from bed to chair and chair to bed with modified independence using the least restrictive device within 7 day(s).  3.  Patient will perform sit to stand with modified independence within 7 day(s).  4.  Patient will ambulate with modified independence for 150 feet with the least restrictive device within 7 day(s).   5.  Patient will participate in lower extremity therapeutic exercise with independence within 7 day(s).    6.  Patient will verbalize and demonstrate understanding of spinal precautions (No bending, lifting greater than 5 lbs, or twisting; log-roll technique; frequent repositioning as instructed) within 7 days.    01/06/2021 1455 by Janace Gurney  Outcome: Not Met     PHYSICAL THERAPY EVALUATION  Patient: [name] (75 y.o. female)  Date: 01/06/2021  Primary Diagnosis: Sepsis (HCC) [A41.9]       Precautions: falls, Back    ASSESSMENT  Pt is a 75 y.o. female with PMH of COPD, hyperlipidemia,came to [facility] ED with c/o recurrent falls with difficulty ambulating, admitted on 01/02/2021 for sepsis. MRI of the thoracic spine shows multiple remote compression fractures and subacute T10 compression fracture with retropulsed fragments that produce moderate stenosis without significant myelomalacia. Per ortho note,  fracture appears to be healing at this point so kyphoplasty is not indicated. Per nsg, pt has been  ambulating to and from the bathroom.    Pt is A& O x 4, received semi-supine in bed , agreeable for PT eval/tx. Based on current observations, pt  presents with deficits in generalized strength b/l LE, static/dynamic standing balance, functional activity tolerance impacting overall performance of functional transfers/mobility. Pt educated about back precautions, verbalizes understanding. Pt needs SBA for bed mobility( log roll technique), demonstrates intact static sitting balance , needs CGA for for sit <>stand and toilet transfers, IND with perineal care, good  static  standing balance with support , is able to ambulate - 51' with RW, step to shuffling gait with slow cadence ,decreased step length and decreased foot clearance b/l Le and no c/o dizziness. Overall, pt tolerates session fair today and would benefit from  skilled PT services to address noted deficits and maximize IND/safety with functional transfers/mobility. Recommend d/c to SNF when medically appropriate.     Current Level of Function Impacting Discharge (mobility/balance): Pt requires CGA for transfers/mobility    Other factors to consider for discharge: recurrent falls, lives alone      PLAN :  Recommendations and Planned Interventions: bed mobility training, transfer training, gait training, therapeutic exercises, neuromuscular re-education, patient and family training/education, and therapeutic activities      Recommend for staff: Out of bed to chair for meals, Encourage HEP in prep for ADLs/mobility, Amb to bathroom for toileting with gt belt and AD, and Use of bed/chair alarm for safety    Frequency/Duration: Patient will be followed by physical therapy:  3-5x/week to address goals.    Recommendation for discharge: (in order for the patient to meet his/her long term goals)  Skilled Nursing Facility    This discharge recommendation:  Has been made in collaboration with the attending provider and/or case management    IF patient discharges home  will need the following DME: patient owns DME required for discharge         SUBJECTIVE:   Patient stated "I had 5 falls in past 3 months"    OBJECTIVE DATA SUMMARY:   HISTORY:    Past Medical History:   Diagnosis Date    Chronic obstructive pulmonary disease (HCC)     Hypercholesteremia     Insomnia     Sleep disorder    History reviewed. No pertinent surgical history.    Home Situation  Home Environment: Private residence  # Steps to Enter: 4  Rails to Enter: Yes  Hand Rails : Bilateral (wide)  Wheelchair Ramp: No  One/Two Story Residence: Two story, live on 1st floor  # of Interior Steps: 12  Interior Rails: Left  Living Alone: Yes  Support Systems: Friend/Neighbor  Patient Expects to be Discharged to:: Skilled nursing facility  Current DME Used/Available at Home: Environmental consultant, rolling  Tub or Shower Type: Tub/Shower combination    EXAMINATION/PRESENTATION/DECISION MAKING:   Critical Behavior:  Neurologic State: Alert  Orientation Level: Oriented X4  Cognition: Follows commands     Hearing:  Auditory  Auditory Impairment: None  Skin:  intact where exposed    Range Of Motion:  AROM: Within functional limits    Strength:    Strength: Generally decreased, functional (4/5 grossly for b/l LE)    Tone & Sensation:   Tone: Normal    Sensation: Intact    Functional Mobility:  Bed Mobility:  Rolling: Stand-by assistance  Supine to Sit: Stand-by assistance  Sit to Supine: Stand-by assistance  Scooting: Stand-by assistance  Transfers:  Sit to Stand: Contact guard assistance  Stand to Sit: Contact guard assistance    Bed to Chair: Contact guard assistance    Balance:   Sitting: Intact;Without support  Standing: Impaired;With support  Standing - Static: Good;Constant support  Standing - Dynamic : Fair;Constant support  Ambulation/Gait Training:  Distance (ft): 45 Feet (ft)  Assistive Device: Walker, rolling;Gait belt  Ambulation - Level of Assistance: Contact guard assistance    Gait Abnormalities: Shuffling gait;Step to  gait    Speed/Cadence: Shuffled;Slow  Step Length: Right shortened;Left shortened    Functional Measure:  [employer] AM-PACT "6 Clicks"         Basic Mobility Inpatient Short Form  How much difficulty does the patient currently have... Unable A Lot A Little None   1.  Turning over in bed (including adjusting bedclothes, sheets and blankets)?   []  1   []  2   [x]  3   []  4   2.  Sitting down on and standing up from a chair with arms ( e.g., wheelchair, bedside commode, etc.)   []  1   []  2   [x]  3   []  4   3.  Moving from lying on back to sitting on the side of the bed?   []  1   []  2   [x]  3   []  4          How much help from another person does the patient currently need... Total A Lot A Little None   4.  Moving to and from a bed  to a chair (including a wheelchair)?   []  1   []  2   [x]  3   []  4   5.  Need to walk in hospital room?   []  1   []  2   [x]  3   []  4   6.  Climbing 3-5 steps with a railing?   []  1   []  2   [x]  3   []  4    2007, Trustees of [address], under license to [place], [place]. All rights reserved     Score:  Initial: 18 Most Recent: X (Date: 01/06/21 )   Interpretation of Tool:  Represents activities that are increasingly more difficult (i.e. Bed mobility, Transfers, Gait).  Score 24 23 22-20 19-15 14-10 9-7 6   Modifier CH CI CJ CK CL CM CN         Physical Therapy Evaluation Charge Determination   History Examination Presentation Decision-Making   MEDIUM  Complexity : 1-2 comorbidities / personal factors will impact the outcome/ POC  MEDIUM Complexity : 3 Standardized tests and measures addressing body structure, function, activity limitation and / or participation in recreation  LOW Complexity : Stable, uncomplicated  Other outcome measures ampac 6  medium complexity      Based on the above components, the patient evaluation is determined to be of the following complexity level: LOW     Pain Rating:  4-5/10 pain at back    Activity Tolerance:   Fair    After treatment patient left in no  apparent distress:   Bed locked and in lowest position Supine in bed, Call bell within reach, and Bed / chair alarm activated and nsg updated.      COMMUNICATION/EDUCATION:   The patient's plan of care was discussed with: Occupational therapist and Registered nurse.     Fall prevention education was provided and the patient/caregiver indicated understanding. and Patient/family agree to work toward stated goals and plan of care.    Partial PT/OT session occurred together for increased pt's safety.     Thank you for this referral.  Jannell Fantasia, PT   Time Calculation: 31 mins

## 2021-01-06 NOTE — Consults (Signed)
Consult Date: 01/06/2021    Consults  [name] is a 75 year old woman with past medical history of COPD, active smoker who has been having severe dizziness for at least the past few days but she cannot recall exactly when it started.  She ambulates with a walker and has been using this walker over the past several months.  She says that she is having severe dizziness which is causing her to fall.  She recalls yesterday she was setting up in the kitchen and as she was trying to get out of the kitchen to walk in the hallway she was felt very very dizzy.  She denies any room spinning.  She cannot give any further history.  Denies any focal symptoms numbness tingling or weakness.  CT scan of the head was unrevealing.  Labs show anemia.    Subjective              headache this morning    Past Medical History:   Diagnosis Date    Chronic obstructive pulmonary disease ([place])     Hypercholesteremia     Insomnia     Sleep disorder       History reviewed. No pertinent surgical history.  History reviewed. No pertinent family history.   Social History     Tobacco Use    Smoking status: Former     Packs/day: 1.00     Types: Cigarettes     Quit date: 2021     Years since quitting: 1.9     Passive exposure: Past    Smokeless tobacco: Never   Substance Use Topics    Alcohol use: Yes     Alcohol/week: 1.0 standard drink     Types: 1 Cans of beer per week     Comment: 1 can of beer every 1-2 weeks       Current Facility-Administered Medications   Medication Dose Route Frequency Provider Last Rate Last Admin    piperacillin-tazobactam (ZOSYN) 3.375 g in 0.9% sodium chloride (MBP/ADV) 100 mL MBP  3.375 g IntraVENous Q8H Vallarie Mare, MD 25 mL/hr at 01/06/21 0330 3.375 g at 01/06/21 0330    enoxaparin (LOVENOX) injection 30 mg  30 mg SubCUTAneous DAILY Crawford Givens, MD   30 mg at 01/05/21 1010    sodium chloride (NS) flush 5-10 mL  5-10 mL IntraVENous PRN Calcagni, Raelyn Ensign, MD   10 mL at 01/02/21 1930    albuterol  (PROVENTIL HFA, VENTOLIN HFA, PROAIR HFA) inhaler 2 Puff  2 Puff Inhalation Q4H PRN Crawford Givens, MD        pantoprazole (PROTONIX) tablet 40 mg  40 mg Oral DAILY Crawford Givens, MD   40 mg at 01/05/21 1010    atorvastatin (LIPITOR) tablet 40 mg  40 mg Oral DAILY Crawford Givens, MD   40 mg at 01/05/21 1010    metoprolol succinate (TOPROL-XL) XL tablet 25 mg  25 mg Oral DAILY Crawford Givens, MD   25 mg at 01/05/21 1010    budesonide-formoteroL (SYMBICORT) 160-4.5 mcg/actuation HFA inhaler 2 Puff  2 Puff Inhalation BID RT Crawford Givens, MD   2 Puff at 01/06/21 3382    And    tiotropium bromide (SPIRIVA RESPIMAT) 2.5 mcg /actuation  2 Puff Inhalation DAILY Crawford Givens, MD   2 Puff at 01/05/21 0745    sodium chloride (NS) flush 5-40 mL  5-40 mL IntraVENous Q8H Crawford Givens, MD   10 mL at 01/06/21 0540    sodium chloride (NS) flush 5-40  mL  5-40 mL IntraVENous PRN Crawford Givens, MD        acetaminophen (TYLENOL) tablet 650 mg  650 mg Oral Q6H PRN Crawford Givens, MD   650 mg at 01/05/21 0311    ondansetron (ZOFRAN ODT) tablet 4 mg  4 mg Oral Q6H PRN Crawford Givens, MD        Or    ondansetron (ZOFRAN) injection 4 mg  4 mg IntraVENous Q6H PRN Crawford Givens, MD            Review of Systems   Neurological:  Positive for dizziness.   All other systems reviewed and are negative.    Objective     Vital signs for last 24 hours:  Visit Vitals  BP 130/75 (BP 1 Location: Left upper arm, BP Patient Position: At rest)   Pulse 89   Temp 98.4 ??F (36.9 ??C)   Resp 20   Ht 5' (1.524 m)   Wt 43.4 kg (95 lb 10.9 oz)   SpO2 94%   Breastfeeding No   BMI 18.69 kg/m??       Intake/Output this shift:  Current Shift: No intake/output data recorded.  Last 3 Shifts: No intake/output data recorded.    Data Review:   Recent Results (from the past 24 hour(s))   METABOLIC PANEL, BASIC    Collection Time: 01/06/21  6:41 AM   Result Value Ref Range    Sodium 139 136 -  145 mmol/L    Potassium 3.8 3.5 - 5.1 mmol/L    Chloride 111 (H) 97 - 108 mmol/L    CO2 22 21 - 32 mmol/L    Anion gap 6 5 - 15 mmol/L    Glucose 96 65 - 100 mg/dL    BUN 18 6 - 20 mg/dL    Creatinine 0.79 0.55 - 1.02 mg/dL    BUN/Creatinine ratio 23 (H) 12 - 20      eGFR >60 >60 ml/min/1.61m    Calcium 8.8 8.5 - 10.1 mg/dL   CBC WITH AUTOMATED DIFF    Collection Time: 01/06/21  6:41 AM   Result Value Ref Range    WBC 11.4 (H) 3.6 - 11.0 K/uL    RBC 3.21 (L) 3.80 - 5.20 M/uL    HGB 10.0 (L) 11.5 - 16.0 g/dL    HCT 31.3 (L) 35.0 - 47.0 %    MCV 97.5 80.0 - 99.0 FL    MCH 31.2 26.0 - 34.0 PG    MCHC 31.9 30.0 - 36.5 g/dL    RDW 13.0 11.5 - 14.5 %    PLATELET 298 150 - 400 K/uL    MPV 8.8 (L) 8.9 - 12.9 FL    NRBC 0.0 0.0 PER 100 WBC    ABSOLUTE NRBC 0.00 0.00 - 0.01 K/uL    NEUTROPHILS 66 32 - 75 %    LYMPHOCYTES 20 12 - 49 %    MONOCYTES 8 5 - 13 %    EOSINOPHILS 4 0 - 7 %    BASOPHILS 0 0 - 1 %    IMMATURE GRANULOCYTES 2 (H) 0 - 0.5 %    ABS. NEUTROPHILS 7.6 1.8 - 8.0 K/UL    ABS. LYMPHOCYTES 2.2 0.8 - 3.5 K/UL    ABS. MONOCYTES 0.9 0.0 - 1.0 K/UL    ABS. EOSINOPHILS 0.4 0.0 - 0.4 K/UL    ABS. BASOPHILS 0.1 0.0 - 0.1 K/UL    ABS. IMM. GRANS. 0.2 (H) 0.00 - 0.04 K/UL  DF AUTOMATED     C REACTIVE PROTEIN, QT    Collection Time: 01/06/21  6:41 AM   Result Value Ref Range    C-Reactive protein 4.62 (H) 0.00 - 0.60 mg/dL       Physical Exam    Neuro Physical Exam      General: Well developed, well nourished. Patient in no apparent distress.  Cardiac: Regular rate and rhythm.  Other:     Neurological Exam:  Mental Status: Awake alert oriented x4   Cranial Nerves:   Intact visual fields.  PERRL, EOM's full, no nystagmus, no ptosis. Facial sensation is normal. Facial movement is symmetric.  Palate is midline.  Normal sternocleidomastoid strength. Tongue is midline. Hearing is intact bilaterally.   Motor:  5/5 strength in upper and lower proximal and distal muscles. Normal bulk and tone.    Reflexes:   Deep tendon  reflexes 3+/4 left patellar, did not test right one due to pain.   3+ in bilateral brachiorad   Sensory:   Normal to light touch throughout, proprioception intact   Gait Not tested because patient uses walker at baseline   Tremor:   No tremor noted.   Cerebellar:  No cerebellar signs present.   Babinski:      Up bilaterally    Assessment and plan: [name] is a 75 year old woman with severe dizziness especially upon walking.  Neurology was consulted for frequent falls but falls are secondary to her lightheadedness possibly secondary to anemia.      On today's examination she does have Babinski and some hyperreflexia.  I will rule out cord compression with MRI of the cervical and thoracic spine.  If these are normal I plan to see her in the office in about 4 to 6 weeks for further work-up for recurrent falls and likely will proceed with an EMG.    Headaches: We will start amitriptyline 25 mg daily.    Dizziness: Possibly orthostatic hypotension.  It resolves when she sits or lays down.    Follow-up in clinic in 6 weeks.

## 2021-01-06 NOTE — Progress Notes (Signed)
 OCCUPATIONAL THERAPY EVALUATION  Patient: [name] (75 y.o. female)  Date: 01/06/2021  Primary Diagnosis: Sepsis (HCC) [A41.9]       Precautions: fall risk, spinal precautions     ASSESSMENT  Pt is a 75 y.o. female presenting to [facility] with c/o recurrent falls w/ difficulty ambulating, admitted 12/11 and currently being treated for sepsis and subacute T10 compression fx w/ retropulsed fragments. Per orhto note, no plans for surgical intervention. Pt received semi-supine in bed upon arrival, AXO x4, and agreeable to OT/PT evaluation. OT/PT educated pt on spinal precautions prior to ambulation; pt verbalized understanding.     Based on current observations, pt presents with deficits in generalized strength/AROM, bed mobility, static/dynamic sitting balance, static/dynamic standing balance (see PT note for gait details), functional activity tolerance, and pain currently impacting overall performance of ADLs and functional transfers/mobility (see below for objective details and assist levels). Overall, pt tolerates session fair with 4-5/10 pain in back. She req'd SBA for bed mobility via log roll technique and ambulated within room using RW w/ CGA. Pt req'd cues for hand placement for safe transfer on/off surfaces; demo'd unsafe descent onto bathroom commode. Tolerated standing at bathroom while completing posterior pericare w/ CGA for safety; cues to maintain precautions while completing task. Pt would benefit from continued skilled OT services to address current impairments and improve IND and safety with self cares and functional transfers/mobility. Current OT d/c recommendation Skilled Nursing Facility once medically appropriate.    Other factors to consider for discharge: family/social support, DME, time since onset, severity of deficits, functional baseline     Patient will benefit from skilled therapy intervention to address the above noted impairments.       PLAN :  Recommendations and Planned Interventions:  self care training, functional mobility training, therapeutic exercise, balance training, therapeutic activities, endurance activities, and patient education    Recommend with staff: Amb to bathroom for toileting with gt belt and AD    Recommend next session: LB dressing , LB bathing, and Standing grooming    Frequency/Duration: Patient will be followed by occupational therapy:  3-5x/week to address goals.    Recommendation for discharge: (in order for the patient to meet his/her long term goals)  Skilled Nursing Facility    This discharge recommendation:  Has been made in collaboration with the attending provider and/or case management    IF patient discharges home will need the following DME: TBD at next placement       SUBJECTIVE:   Patient stated "I fall when I don't use my device."    OBJECTIVE DATA SUMMARY:   HISTORY:   Past Medical History:   Diagnosis Date    Chronic obstructive pulmonary disease (HCC)     Hypercholesteremia     Insomnia     Sleep disorder      History reviewed. No pertinent surgical history.    Per pt:   Home Situation  Home Environment: Private residence  # Steps to Enter: 5  Rails to Enter: Yes  Hand Rails : Bilateral (wide)  Wheelchair Ramp: No  One/Two Story Residence: Two story, live on 1st floor  # of Interior Steps: 12  Interior Rails: Left  Living Alone: Yes  Support Systems: Friend/Neighbor  Patient Expects to be Discharged to:: Skilled nursing facility  Current DME Used/Available at Home: Environmental consultant, rolling  Tub or Shower Type: Tub/Shower combination      EXAMINATION OF PERFORMANCE DEFICITS:  Cognitive/Behavioral Status:  Neurologic State: Alert  Orientation Level: Oriented  X4  Cognition: Follows commands               Hearing:  Auditory  Auditory Impairment: None      Range of Motion:  AROM: Generally decreased, functional                         Strength:  Strength: Generally decreased, functional                Coordination:     Fine Motor Skills-Upper: Left Intact;Right Intact     Gross Motor Skills-Upper: Left Intact;Right Intact    Tone & Sensation:  Tone: Normal  Sensation: Intact                      Balance:  Sitting: Intact;Without support  Standing: Impaired;With support  Standing - Static: Good;Constant support  Standing - Dynamic : Fair;Constant support    Functional Mobility and Transfers for ADLs:  Bed Mobility:  Rolling: Stand-by assistance  Supine to Sit: Stand-by assistance  Sit to Supine: Stand-by assistance  Scooting: Stand-by assistance    Transfers:  Sit to Stand: Contact guard assistance  Stand to Sit: Contact guard assistance  Bed to Chair: Contact guard assistance  Bathroom Mobility: Contact guard assistance  Toilet Transfer : Contact guard assistance  Assistive Device : Walker, rolling      ADL Intervention and task modifications:                                Toileting  Bowel Hygiene: Contact guard assistance (for standing balance)         Therapeutic Exercise:  Pt will benefit from BUE HEP to improve participation in ADLs and mobility. Plan will be initiated at next session.       Functional Measure:    [employer] AM-PACTM 6 Clicks                                                       Daily Activity Inpatient Short Form  How much help from another person does the patient currently need... Total; A Lot A Little None   1.  Putting on and taking off regular lower body clothing? []   1 [x]   2 []   3 []   4   2.  Bathing (including washing, rinsing, drying)? []   1 []   2 [x]   3 []   4   3.  Toileting, which includes using toilet, bedpan or urinal? []  1 []   2 [x]   3 []   4   4.  Putting on and taking off regular upper body clothing? []   1 []   2 [x]   3 []   4   5.  Taking care of personal grooming such as brushing teeth? []   1 []   2 [x]   3 []   4   6.  Eating meals? []   1 []   2 []   3 [x]   4    2007, Trustees of [address], under license to [employer], [place]. All rights reserved     Score: 18/24     Interpretation of Tool:  Represents clinically-significant functional  categories (i.e. Activities of daily living).  Percentage of Impairment CH    0%   CI  1-19% CJ    20-39% CK    40-59% CL    60-79% CM    80-99% CN     100%   AMPAC  Score 6-24 24 23  20-22 15-19 10-14 7-9 6     Occupational Therapy Evaluation Charge Determination   History Examination Decision-Making   LOW Complexity : Brief history review  LOW Complexity : 1-3 performance deficits relating to physical, cognitive , or psychosocial skils that result in activity limitations and / or participation restrictions  MEDIUM Complexity : Patient may present with comorbidities that affect occupational performnce. Miniml to moderate modification of tasks or assistance (eg, physical or verbal ) with assesment(s) is necessary to enable patient to complete evaluation       Based on the above components, the patient evaluation is determined to be of the following complexity level: LOW   Pain Rating:  See note above    Activity Tolerance:   Fair and requires rest breaks    After treatment patient left in no apparent distress:    Left w/ PT in bathroom to ambulate ,    COMMUNICATION/EDUCATION:   The patient's plan of care was discussed with: Physical therapist and Registered nurse.     Patient/family have participated as able in goal setting and plan of care. and Patient/family agree to work toward stated goals and plan of care.    This patient's plan of care is appropriate for delegation to OTA.     PT/OT sessions overlapped today for increased safety of pt and clinician.     Thank you for this referral.  Adelina Drones, OT  Time Calculation: 25 mins   Problem: Self Care Deficits Care Plan (Adult)  Goal: *Acute Goals and Plan of Care (Insert Text)  Description: FUNCTIONAL STATUS PRIOR TO ADMISSION: Pt reports she was IND w/ functional mobility/ADLs prior to admission. She reports at least 5 falls within the last 3 months.     HOME SUPPORT: Pt lives by herself and neighbors check in on her some.    Occupational Therapy  Goals  Initiated 01/06/2021    Pt stated goal I want to get better.  Pt will be IND sup <> sit in prep for EOB ADLs  Pt will be mod I grooming standing sink side LRAD  Pt will be IND UB dressing sitting EOB/long sit   Pt will be IND LE dressing sitting EOB/long sit  Pt will be Mod I sit <> stand in prep for toileting LRAD  Pt will be Mod I toileting/toilet transfer/cloth mgmt LRAD  Pt will be IND following UE HEP in prep for self care tasks   Outcome: Not Met

## 2021-01-06 NOTE — Consults (Signed)
Consults  by Alois Cliche, MD at 01/06/21 [phone]                Author: Alois Cliche, MD  Service: Orthopedic Surgery  Author Type: Physician       Filed: 01/06/21 0744  Date of Service: 01/06/21 0736  Status: Signed          Editor: Alois Cliche, MD (Physician)            Consult Orders        1. IP CONSULT TO ORTHOPEDIC SURGERY [[id number]] ordered by Margie Ege, PA-C at 01/04/21 1420                                            Spine Surgery Consult Note          Patient: [name]  MRN: [id number]   SSN: [id number]          Date of Birth: 22-Feb-1945   Age: 75 y.o.   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  She has a T10 compression fracture with retropulsed fragments but the fracture appears to be healing  at this point as well so kyphoplasty is not indicated.  For now I advised only physical therapy with possible placement in acute rehab versus discharge home.  She can follow-up with me in 3 to 4 weeks for follow-up evaluation of her myelopathy.  I will  revisit surgical intervention with her if there is any progression of myelopathy signs and symptoms which are relatively mild at present.  Based on the MRI findings, I do not feel that the spine has any ongoing infectious process.    Thank you  for allowing me to participate in the care of this patient.        Subjective:         [name] is a 75 y.o. female who is being seen for T10 compression fracture with stenosis and possible myelopathy.  The patient reports that she does have some falling episodes and was in rehab when some of the  symptoms worsened and she was sent  back to [facility] for reevaluation.  She states that she is able to walk well with a walker at this time despite some back pain.  She denies numbness and tingling in the legs and does not note any specific weakness that she can identify.   No bowel or bladder dysfunction reported in the chart reported by the patient..        Past Medical  History:        Diagnosis  Date         ?  Chronic obstructive pulmonary disease (HCC)       ?  Hypercholesteremia       ?  Insomnia           ?  Sleep disorder          History reviewed. No pertinent surgical history.    History reviewed. No pertinent family history.     Social History          Tobacco Use         ?  Smoking status:  Former              Packs/day:  1.00         Types:  Cigarettes         Quit date:  2021         Years since quitting:  1.9         Passive exposure:  Past         ?  Smokeless tobacco:  Never       Substance Use Topics         ?  Alcohol use:  Yes              Alcohol/week:  1.0 standard drink         Types:  1 Cans of beer per week             Comment: 1 can of beer every 1-2 weeks           Current Facility-Administered Medications             Medication  Dose  Route  Frequency  Provider  Last Rate  Last Admin              ?  piperacillin-tazobactam (ZOSYN) 3.375 g in 0.9% sodium chloride (MBP/ADV) 100 mL MBP   3.375 g  IntraVENous  Q8H  Lorina Rabon, MD  25 mL/hr at 01/06/21 0330  3.375 g at 01/06/21 0330     ?  enoxaparin (LOVENOX) injection 30 mg   30 mg  SubCUTAneous  DAILY  Celso Sickle, [place], MD     30 mg at 01/05/21 1010     ?  sodium chloride (NS) flush 5-10 mL   5-10 mL  IntraVENous  PRN  Calcagni, Althea Grimmer, MD     10 mL at 01/02/21 1930     ?  albuterol (PROVENTIL HFA, VENTOLIN HFA, PROAIR HFA) inhaler 2 Puff   2 Puff  Inhalation  Q4H PRN  [name], MD           ?  pantoprazole (PROTONIX) tablet 40 mg   40 mg  Oral  DAILY  [name], MD     40 mg at 01/05/21  1010     ?  atorvastatin (LIPITOR) tablet 40 mg   40 mg  Oral  DAILY  [name], MD     40 mg at 01/05/21 1010     ?  metoprolol succinate (TOPROL-XL) XL tablet 25 mg   25 mg  Oral  DAILY  [name], MD     25 mg at 01/05/21 1010              ?  budesonide-formoteroL (SYMBICORT) 160-4.5 mcg/actuation HFA inhaler 2 Puff   2 Puff  Inhalation  BID RT  [name], MD     2  Puff at 01/05/21 2047          And              ?  tiotropium bromide (SPIRIVA RESPIMAT) 2.5 mcg /actuation   2 Puff  Inhalation  DAILY  [name], MD     2 Puff at 01/05/21 0745     ?  sodium chloride (NS) flush 5-40 mL   5-40 mL  IntraVENous  Q8H  [name], MD     10 mL at 01/06/21 0540     ?  sodium chloride (NS) flush 5-40 mL   5-40 mL  IntraVENous  PRN  [name], MD           ?  acetaminophen (TYLENOL) tablet 650 mg   650 mg  Oral  Q6H PRN  [name], MD     650 mg at 01/05/21 0311     ?  ondansetron (ZOFRAN ODT) tablet 4 mg   4 mg  Oral  Q6H PRN  [name], MD                Or              ?  ondansetron (ZOFRAN) injection 4 mg   4 mg  IntraVENous  Q6H PRN  [name], MD                    Allergies        Allergen  Reactions         ?  Cheese  Nausea and Vomiting             Severe vomiting           Review of Systems:   Pertinent items are noted in the History of Present Illness.        Objective:          Vitals:             01/05/21 1520  01/05/21 1951  01/05/21 2047  01/06/21 0130           BP:  116/73  127/78  128/78  130/75     Pulse:  88  89  90  89     Resp:  20  20  18  20      Temp:  97.3 ??F (36.3 ??C)  97.8 ??F (36.6 ??C)  98.6 ??F (37 ??C)  98.4 ??F (36.9 ??C)     SpO2:  95%  94%  95%  92%     Weight:      43.4 kg (95 lb 10.9 oz)             Height:                    Physical Exam:   GENERAL: alert, cooperative, no distress   NEUROLOGIC: The patient is able to demonstrate motor function throughout the lower extremities with no  weakness detectable, though she does have some mild right ankle dorsiflexion limitation in range of motion rather than weakness.  Sensory exam is  also intact bilateral lower extremities.  Clonus is absent.  Reflexes are 1+ and symmetric at the patellar and Achilles tendons.  Gait was not able to be tested.         Imaging Review:      MRI of the thoracic spine was reviewed and shows multiple remote compression fractures and subacute T10 compression fracture with retropulsed fragments that  produce moderate stenosis without significant myelomalacia.  The cord contour in this region is  somewhat deformed, however.         Signed By:  Alois Cliche, MD           January 06, 2021

## 2021-01-06 NOTE — Progress Notes (Signed)
Progress Notes by Lorina Rabon, MD at 01/06/21 1207                Author: Lorina Rabon, MD  Service: --  Author Type: Physician       Filed: 01/07/21 0252  Date of Service: 01/06/21 1207  Status: Signed          Editor: Lorina Rabon, MD (Physician)                             Progress Note          Patient: [name]  MRN: [id number]   SSN: [id number]          Date of Birth: October 05, 1945   Age: 75 y.o.   Sex: female         Admit Date: 01/02/2021     LOS: 4 days         Subjective:     Patient with concern for sepsis, however, no obvious source of infection. Abnormal CT Chest suggests pneumonia though no cough or SOB.   She remains afebrile with decreasing WBC, CRP and procal.  Ceretec scan negative.  Patient resting comfortably with no new complaints.         Objective:          Vitals:             01/05/21 1951  01/05/21 2047  01/06/21 0130  01/06/21 0832           BP:  127/78  128/78  130/75       Pulse:  89  90  89       Resp:  20  18  20        Temp:  97.8 ??F (36.6 ??C)  98.6 ??F (37 ??C)  98.4 ??F (36.9 ??C)       SpO2:  94%  95%  92%  94%     Weight:    95 lb 10.9 oz (43.4 kg)               Height:                    Intake and Output:   Current Shift: No intake/output data recorded.   Last three shifts: No intake/output data recorded.      Physical Exam:    Vitals and nursing note reviewed.    Constitutional:        Appearance: She is not ill-appearing.    HENT:       Head: Normocephalic and atraumatic.         Comments: Flesh colored swelling left forehead, nontender, no erythema, no ecchymoses      Right Ear: External ear normal.       Left Ear: External ear normal.       Mouth/Throat:       Mouth: Mucous membranes are moist.    Eyes:       Pupils: Pupils are equal, round, and reactive to light.    Cardiovascular:       Rate and Rhythm: Normal rate and regular rhythm.    Pulmonary:       Effort: Pulmonary effort is normal.       Breath sounds: Normal breath sounds.    Abdominal:        General: Bowel sounds are normal. There is no distension.       Palpations: Abdomen is soft.  Tenderness: There is no abdominal tenderness.    Musculoskeletal:       Cervical back: Neck supple.       Right lower leg: No edema.       Left lower leg: No edema.    Skin:      Findings: No lesion or rash.    Neurological:       General: No focal deficit present.       Mental Status: She is alert and oriented to person, place, and time.    Psychiatric:          Behavior: Behavior normal.       Lab/Data Review:       WBC 11,400      CRP 4.62 <8.14 <16.50    Procal 0.07 <0.17 <0.23 <0.35      Blood cultures (12/11) No growth 3 days   Urine culture (12/11) No growth FINAL      MRI Thoracic spine w/o contrast (12/13)   1.  Subacute-appearing severe T10 compression fracture with osseous   retropulsion. 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 Associated T9-T11 Mild-to-moderate spinal   canal and neuroforaminal stenoses.   2.  T6-T7 Central disc extrusion with mild spinal stenosis at this level.   3.  Chronic T7 compression deformity.   4.  Scattered signal abnormalities in the bilateral lungs and a small right   pleural effusion may reflect focal pneumonia. Dedicated chest CT recommended for   further characterization.              CT Chest (12/13)  Minimal right basilar atelectasis/consolidation with trace right-sided effusion.   Chronic T5, T7 and T10 vertebral compression fractures. Moderate coronary vascular calcifications.           Ceretec scan (12/13) Unremarkable Whole Body WBC Scan.              Assessment:        Principal Problem:     Sepsis (HCC) (01/02/2021)   1.  Presumptive sepsis with leukocytosis, elevated CRP and procal,  etiology  unclear, resolving Day #5 IV antibiotics, now Zosyn   2.  Possible RLL pneumonia, Day #5 IV Zosyn   3.  Soft tissue swelling left forehead, no signs of infection   4.  Frequent falls with dizziness   5.   Babinksi and hyperreflexia (per Neurology), ruling out cord compression   6.  Possible pneumonia (per MRI)   7.     Comment: Sepsis parameters improving with source presumed to be RLL pneumonia, despite absence or paucity of respiratory symptoms and negative Ceretec scan. WBC, CRP and procal  decreasing.        Plan:     1.  Reasonable to transition to Augmentin 875 mg BID for 10 days tomorrow   2.  In am, repeat CBC, procal,  CRP   3.  Follow-up blood cultures             Signed By:  Lorina Rabon, MD           January 06, 2021

## 2021-01-06 NOTE — Progress Notes (Signed)
Problem: Falls - Risk of  Goal: *Absence of Falls  Description: Document [name] Fall Risk and appropriate interventions in the flowsheet.  Outcome: Progressing Towards Goal  Note: Fall Risk Interventions:  Mobility Interventions: Bed/chair exit alarm, Communicate number of staff needed for ambulation/transfer, Patient to call before getting OOB, PT Consult for mobility concerns, PT Consult for assist device competence    Mentation Interventions: Bed/chair exit alarm, Door open when patient unattended, More frequent rounding, Reorient patient, Room close to nurse's station    Medication Interventions: Bed/chair exit alarm    Elimination Interventions: Bed/chair exit alarm, Call light in reach, Patient to call for help with toileting needs, Stay With Me (per policy), Toileting schedule/hourly rounds    History of Falls Interventions: Bed/chair exit alarm, Door open when patient unattended, Room close to nurse's station         Problem: Patient Education: Go to Patient Education Activity  Goal: Patient/Family Education  Outcome: Progressing Towards Goal     Problem: Pressure Injury - Risk of  Goal: *Prevention of pressure injury  Description: Document Braden Scale and appropriate interventions in the flowsheet.  Outcome: Progressing Towards Goal  Note: Pressure Injury Interventions:  Sensory Interventions: Assess changes in LOC    Moisture Interventions: Absorbent underpads    Activity Interventions: Increase time out of bed    Mobility Interventions: HOB 30 degrees or less    Nutrition Interventions: Offer support with meals,snacks and hydration    Friction and Shear Interventions: Apply protective barrier, creams and emollients                Problem: Patient Education: Go to Patient Education Activity  Goal: Patient/Family Education  Outcome: Progressing Towards Goal     Problem: Patient Education: Go to Patient Education Activity  Goal: Patient/Family Education  Outcome: Progressing Towards Goal     Problem:  Patient Education: Go to Patient Education Activity  Goal: Patient/Family Education  Outcome: Progressing Towards Goal

## 2021-01-06 NOTE — Progress Notes (Signed)
Hospitalist Progress Note    Subjective:   Daily Progress Note: 01/06/2021 7:55 AM    Hospital Course: Patient65 y.o. female with history of COPD stable on treatment maintenance inhalers, hyperlipidemia, history of recurrent falls with generalized weakness suspected of cardiac syncope presents to the emergency room complaining of recurrent falls.  Last 3 days she was falling and injuring herself and she does not know how she is falling.  Admits dizziness with ambulation.  Denies any chest pain or palpitations.  Denies any fever or chills.  Admits appetite is decreased and not eating well.      Evaluation in the emergency room significant leukocytosis with left shift, negative evidence of infection, chest x-ray did not show any pneumonia.  Urine analysis is clear, skin with no wounds.  Negative for COVID-19, influenza.  She seems to have infection but source is unclear at this time. ID consulted.  Inflammatory markers elevated.  Started on ceftaroline and then transition to IV Zosyn.  CT of the chest showed mild right atelectasis/consolidation with trace right sided fusion with chronic T5, T7, T10 vertebral compression fractures, moderate coronary vascular calcifications nuclear medicine bone scan unremarkable.     Neurology consulted.  CT of the head and cervical spine unremarkable.  Per the neurology note no further work-up warranted.  Recommend PT and OT evaluation.  Patient most likely will need placement to rehab.  Orthostatics positive.  Given a fluid bolus.  MRI of the cervical and thoracic spine showed no acute fracture or subluxation of the cervical spine with multilevel generative changes, disc disease, listlessness contributing to multilevel mild to moderate spinal canal stenosis and varying degrees of neural foraminal stenosis, subacute appearing severe T10 compression fracture with retropulsion, moderate to severe spinal cord compression with findings of subtle compression myopathy at this level,  associated T9-T11 mild to moderate spinal canal and neuroforaminal stenosis, T6-T7 central disc extrusion with mild spinal stenosis, chronic T7 compression deformity.  Per orthopedic spinal surgeon will defer surgery at this time and recommend physical therapy follow-up with them in office in 3 to 4 weeks    Subjective: Patient says she feels fine    Current Facility-Administered Medications   Medication Dose Route Frequency    piperacillin-tazobactam (ZOSYN) 3.375 g in 0.9% sodium chloride (MBP/ADV) 100 mL MBP  3.375 g IntraVENous Q8H    enoxaparin (LOVENOX) injection 30 mg  30 mg SubCUTAneous DAILY    sodium chloride (NS) flush 5-10 mL  5-10 mL IntraVENous PRN    albuterol (PROVENTIL HFA, VENTOLIN HFA, PROAIR HFA) inhaler 2 Puff  2 Puff Inhalation Q4H PRN    pantoprazole (PROTONIX) tablet 40 mg  40 mg Oral DAILY    atorvastatin (LIPITOR) tablet 40 mg  40 mg Oral DAILY    metoprolol succinate (TOPROL-XL) XL tablet 25 mg  25 mg Oral DAILY    budesonide-formoteroL (SYMBICORT) 160-4.5 mcg/actuation HFA inhaler 2 Puff  2 Puff Inhalation BID RT    And    tiotropium bromide (SPIRIVA RESPIMAT) 2.5 mcg /actuation  2 Puff Inhalation DAILY    sodium chloride (NS) flush 5-40 mL  5-40 mL IntraVENous Q8H    sodium chloride (NS) flush 5-40 mL  5-40 mL IntraVENous PRN    acetaminophen (TYLENOL) tablet 650 mg  650 mg Oral Q6H PRN    ondansetron (ZOFRAN ODT) tablet 4 mg  4 mg Oral Q6H PRN    Or    ondansetron (ZOFRAN) injection 4 mg  4 mg IntraVENous Q6H PRN  Review of Systems  Constitutional: No fevers, No chills, No sweats, No fatigue, +Weakness  Eyes: No redness  Ears, nose, mouth, throat, and face: No nasal congestion, No sore throat, No voice change  Respiratory: No Shortness of Breath, No cough, No wheezing  Cardiovascular: No chest pain, No palpitations, No extremity edema  Gastrointestinal: No nausea, No vomiting, No diarrhea, No abdominal pain  Genitourinary: No frequency, No dysuria, No hematuria  Integument/breast:  No skin lesion(s)   Neurological: No Confusion, No headaches, No dizziness      Objective:     Visit Vitals  BP 130/75 (BP 1 Location: Left upper arm, BP Patient Position: At rest)   Pulse 89   Temp 98.4 ??F (36.9 ??C)   Resp 20   Ht 5' (1.524 m)   Wt 43.4 kg (95 lb 10.9 oz)   SpO2 94%   Breastfeeding No   BMI 18.69 kg/m??      O2 Device: None (Room air)    Temp (24hrs), Avg:98 ??F (36.7 ??C), Min:97.3 ??F (36.3 ??C), Max:98.6 ??F (37 ??C)      No intake/output data recorded.  No intake/output data recorded.    PHYSICAL EXAM:  Constitutional: No acute distress  Skin: Extremities and face reveal no rashes.   HEENT: Sclerae anicteric. Extra-occular muscles are intact. No oral ulcers. The neck is supple and no masses.   Cardiovascular: RRR  Respiratory:  Clear breath sounds bilaterally with no wheezes, rales, or rhonchi.   GI: Abdomen nondistended, soft, and nontender. Normal active bowel sounds.  Musculoskeletal: No pitting edema of the lower legs. Able to move all ext  Neurological:  Patient is alert and oriented. Cranial nerves II-XII grossly intact  Psychiatric: Mood appears appropriate       Data Review    Recent Results (from the past 24 hour(s))   METABOLIC PANEL, BASIC    Collection Time: 01/06/21  6:41 AM   Result Value Ref Range    Sodium 139 136 - 145 mmol/L    Potassium 3.8 3.5 - 5.1 mmol/L    Chloride 111 (H) 97 - 108 mmol/L    CO2 22 21 - 32 mmol/L    Anion gap 6 5 - 15 mmol/L    Glucose 96 65 - 100 mg/dL    BUN 18 6 - 20 mg/dL    Creatinine 0.79 0.55 - 1.02 mg/dL    BUN/Creatinine ratio 23 (H) 12 - 20      eGFR >60 >60 ml/min/1.96m    Calcium 8.8 8.5 - 10.1 mg/dL   CBC WITH AUTOMATED DIFF    Collection Time: 01/06/21  6:41 AM   Result Value Ref Range    WBC 11.4 (H) 3.6 - 11.0 K/uL    RBC 3.21 (L) 3.80 - 5.20 M/uL    HGB 10.0 (L) 11.5 - 16.0 g/dL    HCT 31.3 (L) 35.0 - 47.0 %    MCV 97.5 80.0 - 99.0 FL    MCH 31.2 26.0 - 34.0 PG    MCHC 31.9 30.0 - 36.5 g/dL    RDW 13.0 11.5 - 14.5 %    PLATELET 298 150 - 400  K/uL    MPV 8.8 (L) 8.9 - 12.9 FL    NRBC 0.0 0.0 PER 100 WBC    ABSOLUTE NRBC 0.00 0.00 - 0.01 K/uL    NEUTROPHILS 66 32 - 75 %    LYMPHOCYTES 20 12 - 49 %    MONOCYTES 8 5 - 13 %  EOSINOPHILS 4 0 - 7 %    BASOPHILS 0 0 - 1 %    IMMATURE GRANULOCYTES 2 (H) 0 - 0.5 %    ABS. NEUTROPHILS 7.6 1.8 - 8.0 K/UL    ABS. LYMPHOCYTES 2.2 0.8 - 3.5 K/UL    ABS. MONOCYTES 0.9 0.0 - 1.0 K/UL    ABS. EOSINOPHILS 0.4 0.0 - 0.4 K/UL    ABS. BASOPHILS 0.1 0.0 - 0.1 K/UL    ABS. IMM. GRANS. 0.2 (H) 0.00 - 0.04 K/UL    DF AUTOMATED     C REACTIVE PROTEIN, QT    Collection Time: 01/06/21  6:41 AM   Result Value Ref Range    C-Reactive protein 4.62 (H) 0.00 - 0.60 mg/dL         Radiology review: CT of head and C spine    Assessment:   1.  Sepsis syndrome etiology unclear  2.  Gait disturbance with recurrent fall/injury with spinal cord compression, multiple vertebral compression fractures, thoracic myelopathy  3.  Hypertension  4.  COPD without exacerbation  5.  Hyperlipidemia    Plan:   1.  WBC is trending down.  Inflammatory markers elevated.  Unclear source of infection.  Urinalysis unremarkable.  Blood cultures and urine cultures pending.  Rapid COVID and influenza negative.  Infectious disease consulted.  CT of the chest reviewed.  Switch to IV Zosyn.  Nuc med bone scan unremarkable  2.  CT of the head and cervical spine unremarkable.  Neurology consulted.  MRI of the and thoracic spine severely abnormal with spinal cord compression, compression fractures, thoracic myelopathy.  Neurosurgery consulted.  PT and OT evaluation.  Patient most likely will require placement. Per ortho surgeon no intervention needed at this time and to follow-up in the office in 3 to 4 weeks.  3.  Blood pressure stable.  Continue with metoprolol  4.  On Symbicort.  Oxygen saturation within normal limits on room air  5.  Continue atorvastatin  6.  CBC, BMP, inflammatory markers in a.m.    Dispo: 24-48 hours.  Barriers include cultures resulting, WBC  trending down, and case management for discharge planning.  Most likely will require placement    CODE STATUS full     DVT prophylaxis: Lovenox  Ulcer prophylaxis: Protonix    Care Plan discussed with: Patient/Family, Nurse, and Case Manager    Total time spent with patient: 34 minutes.

## 2021-01-07 LAB — CBC WITH AUTOMATED DIFF
ABS. BASOPHILS: 0.1 10*3/uL (ref 0.0–0.1)
ABS. EOSINOPHILS: 0.4 10*3/uL (ref 0.0–0.4)
ABS. IMM. GRANS.: 0.2 10*3/uL — ABNORMAL HIGH (ref 0.00–0.04)
ABS. LYMPHOCYTES: 2.6 10*3/uL (ref 0.8–3.5)
ABS. MONOCYTES: 0.9 10*3/uL (ref 0.0–1.0)
ABS. NEUTROPHILS: 7.1 10*3/uL (ref 1.8–8.0)
ABSOLUTE NRBC: 0 10*3/uL (ref 0.00–0.01)
BASOPHILS: 0 % (ref 0–1)
EOSINOPHILS: 4 % (ref 0–7)
HCT: 30.6 % — ABNORMAL LOW (ref 35.0–47.0)
HGB: 10 g/dL — ABNORMAL LOW (ref 11.5–16.0)
IMMATURE GRANULOCYTES: 2 % — ABNORMAL HIGH (ref 0–0.5)
LYMPHOCYTES: 23 % (ref 12–49)
MCH: 31.9 PG (ref 26.0–34.0)
MCHC: 32.7 g/dL (ref 30.0–36.5)
MCV: 97.8 FL (ref 80.0–99.0)
MONOCYTES: 8 % (ref 5–13)
MPV: 8.8 FL — ABNORMAL LOW (ref 8.9–12.9)
NEUTROPHILS: 63 % (ref 32–75)
NRBC: 0 PER 100 WBC
PLATELET: 300 10*3/uL (ref 150–400)
RBC: 3.13 M/uL — ABNORMAL LOW (ref 3.80–5.20)
RDW: 13 % (ref 11.5–14.5)
WBC: 11.4 10*3/uL — ABNORMAL HIGH (ref 3.6–11.0)

## 2021-01-07 LAB — METABOLIC PANEL, BASIC
Anion gap: 6 mmol/L (ref 5–15)
BUN/Creatinine ratio: 30 — ABNORMAL HIGH (ref 12–20)
BUN: 23 mg/dL — ABNORMAL HIGH (ref 6–20)
CO2: 25 mmol/L (ref 21–32)
Calcium: 9.1 mg/dL (ref 8.5–10.1)
Chloride: 108 mmol/L (ref 97–108)
Creatinine: 0.76 mg/dL (ref 0.55–1.02)
Glucose: 88 mg/dL (ref 65–100)
Potassium: 3.8 mmol/L (ref 3.5–5.1)
Sodium: 139 mmol/L (ref 136–145)
eGFR: >60 mL/min/{1.73_m2} (ref >60)

## 2021-01-07 LAB — C REACTIVE PROTEIN, QT: C-Reactive protein: 2.78 mg/dL — ABNORMAL HIGH (ref 0.00–0.60)

## 2021-01-07 LAB — PROCALCITONIN
Procalcitonin: <0.05 ng/mL — ABNORMAL HIGH
Procalcitonin: <0.05 ng/mL — ABNORMAL HIGH

## 2021-01-07 LAB — CBC WITH AUTO DIFFERENTIAL
Basophils %: 0 % (ref 0–1)
Basophils Absolute: 0.1 10*3/uL (ref 0.0–0.1)
Eosinophils %: 4 % (ref 0–7)
Eosinophils Absolute: 0.4 10*3/uL (ref 0.0–0.4)
Granulocyte Absolute Count: 0.2 10*3/uL — ABNORMAL HIGH (ref 0.00–0.04)
Hematocrit: 30.6 % — ABNORMAL LOW (ref 35.0–47.0)
Hemoglobin: 10 g/dL — ABNORMAL LOW (ref 11.5–16.0)
Immature Granulocytes: 2 % — ABNORMAL HIGH (ref 0–0.5)
Lymphocytes %: 23 % (ref 12–49)
Lymphocytes Absolute: 2.6 10*3/uL (ref 0.8–3.5)
MCH: 31.9 PG (ref 26.0–34.0)
MCHC: 32.7 g/dL (ref 30.0–36.5)
MCV: 97.8 FL (ref 80.0–99.0)
MPV: 8.8 FL — ABNORMAL LOW (ref 8.9–12.9)
Monocytes %: 8 % (ref 5–13)
Monocytes Absolute: 0.9 10*3/uL (ref 0.0–1.0)
NRBC Absolute: 0 10*3/uL (ref 0.00–0.01)
Neutrophils %: 63 % (ref 32–75)
Neutrophils Absolute: 7.1 10*3/uL (ref 1.8–8.0)
Nucleated RBCs: 0 PER 100 WBC
Platelets: 300 10*3/uL (ref 150–400)
RBC: 3.13 M/uL — ABNORMAL LOW (ref 3.80–5.20)
RDW: 13 % (ref 11.5–14.5)
WBC: 11.4 10*3/uL — ABNORMAL HIGH (ref 3.6–11.0)

## 2021-01-07 LAB — BASIC METABOLIC PANEL
Anion Gap: 6 mmol/L (ref 5–15)
BUN: 23 mg/dL — ABNORMAL HIGH (ref 6–20)
Bun/Cre Ratio: 30 — ABNORMAL HIGH (ref 12–20)
CO2: 25 mmol/L (ref 21–32)
Calcium: 9.1 mg/dL (ref 8.5–10.1)
Chloride: 108 mmol/L (ref 97–108)
Creatinine: 0.76 mg/dL (ref 0.55–1.02)
ESTIMATED GLOMERULAR FILTRATION RATE: >60 mL/min/{1.73_m2} (ref >60)
Glucose: 88 mg/dL (ref 65–100)
Potassium: 3.8 mmol/L (ref 3.5–5.1)
Sodium: 139 mmol/L (ref 136–145)

## 2021-01-07 LAB — C-REACTIVE PROTEIN: CRP: 2.78 mg/dL — ABNORMAL HIGH (ref 0.00–0.60)

## 2021-01-07 MED ORDER — AMOXICILLIN CLAVULANATE 875 MG-125 MG TAB
875-125 mg | ORAL_TABLET | Freq: Two times a day (BID) | ORAL | 0 refills | Status: AC
Start: 2021-01-07 — End: 2021-01-17

## 2021-01-07 MED ORDER — AMITRIPTYLINE 75 MG TAB
75 mg | ORAL_TABLET | Freq: Every evening | ORAL | 0 refills | Status: AC
Start: 2021-01-07 — End: 2021-02-06

## 2021-01-07 MED FILL — TYLENOL 325 MG TABLET: 325 mg | ORAL | Qty: 2

## 2021-01-07 MED FILL — PIPERACILLIN-TAZOBACTAM 3.375 GRAM IV SOLR: 3.375 gram | INTRAVENOUS | Qty: 3.38

## 2021-01-07 MED FILL — PANTOPRAZOLE 40 MG TAB, DELAYED RELEASE: 40 mg | ORAL | Qty: 1

## 2021-01-07 MED FILL — METOPROLOL SUCCINATE SR 50 MG 24 HR TAB: 50 mg | ORAL | Qty: 1

## 2021-01-07 MED FILL — ATORVASTATIN 40 MG TAB: 40 mg | ORAL | Qty: 1

## 2021-01-07 MED FILL — SPIRIVA RESPIMAT 2.5 MCG/ACTUATION SOLUTION FOR INHALATION: 2.5 mcg/actuation | RESPIRATORY_TRACT | Qty: 4

## 2021-01-07 MED FILL — SYMBICORT 160 MCG-4.5 MCG/ACTUATION HFA AEROSOL INHALER: RESPIRATORY_TRACT | Qty: 6

## 2021-01-07 MED FILL — ENOXAPARIN 30 MG/0.3 ML SUB-Q SYRINGE: 30 mg/0.3 mL | SUBCUTANEOUS | Qty: 0.3

## 2021-01-07 MED FILL — AMITRIPTYLINE 25 MG TAB: 25 mg | ORAL | Qty: 1

## 2021-01-07 NOTE — Progress Notes (Signed)
Hospitalist Progress Note    Subjective:   Daily Progress Note: 01/07/2021 7:55 AM    Hospital Course: Patient33 y.o. female with history of COPD stable on treatment maintenance inhalers, hyperlipidemia, history of recurrent falls with generalized weakness suspected of cardiac syncope presents to the emergency room complaining of recurrent falls.  Last 3 days she was falling and injuring herself and she does not know how she is falling.  Admits dizziness with ambulation.  Denies any chest pain or palpitations.  Denies any fever or chills.  Admits appetite is decreased and not eating well.      Evaluation in the emergency room significant leukocytosis with left shift, negative evidence of infection, chest x-ray did not show any pneumonia.  Urine analysis is clear, skin with no wounds.  Negative for COVID-19, influenza.  She seems to have infection but source is unclear at this time. ID consulted.  Inflammatory markers elevated.  Started on ceftaroline and then transition to IV Zosyn.  CT of the chest showed mild right atelectasis/consolidation with trace right sided fusion with chronic T5, T7, T10 vertebral compression fractures, moderate coronary vascular calcifications nuclear medicine bone scan unremarkable. Transition to oral Augmentin per ID for 10 more days     Neurology consulted.  CT of the head and cervical spine unremarkable.  Per the neurology note no further work-up warranted.  Recommend PT and OT evaluation.  Patient most likely will need placement to rehab.  Orthostatics positive.  Given a fluid bolus.  MRI of the cervical and thoracic spine showed no acute fracture or subluxation of the cervical spine with multilevel generative changes, disc disease, listlessness contributing to multilevel mild to moderate spinal canal stenosis and varying degrees of neural foraminal stenosis, subacute appearing severe T10 compression fracture with retropulsion, moderate to severe spinal cord compression with findings  of subtle compression myopathy at this level, associated T9-T11 mild to moderate spinal canal and neuroforaminal stenosis, T6-T7 central disc extrusion with mild spinal stenosis, chronic T7 compression deformity.  Per orthopedic spinal surgeon will defer surgery at this time and recommend physical therapy follow-up with them in office in 3 to 4 weeks    Subjective: No acute issues overnight     Current Facility-Administered Medications   Medication Dose Route Frequency    amitriptyline (ELAVIL) tablet 75 mg  75 mg Oral QHS    piperacillin-tazobactam (ZOSYN) 3.375 g in 0.9% sodium chloride (MBP/ADV) 100 mL MBP  3.375 g IntraVENous Q8H    enoxaparin (LOVENOX) injection 30 mg  30 mg SubCUTAneous DAILY    sodium chloride (NS) flush 5-10 mL  5-10 mL IntraVENous PRN    albuterol (PROVENTIL HFA, VENTOLIN HFA, PROAIR HFA) inhaler 2 Puff  2 Puff Inhalation Q4H PRN    pantoprazole (PROTONIX) tablet 40 mg  40 mg Oral DAILY    atorvastatin (LIPITOR) tablet 40 mg  40 mg Oral DAILY    metoprolol succinate (TOPROL-XL) XL tablet 25 mg  25 mg Oral DAILY    budesonide-formoteroL (SYMBICORT) 160-4.5 mcg/actuation HFA inhaler 2 Puff  2 Puff Inhalation BID RT    And    tiotropium bromide (SPIRIVA RESPIMAT) 2.5 mcg /actuation  2 Puff Inhalation DAILY    sodium chloride (NS) flush 5-40 mL  5-40 mL IntraVENous Q8H    sodium chloride (NS) flush 5-40 mL  5-40 mL IntraVENous PRN    acetaminophen (TYLENOL) tablet 650 mg  650 mg Oral Q6H PRN    ondansetron (ZOFRAN ODT) tablet 4 mg  4 mg Oral  Q6H PRN    Or    ondansetron (ZOFRAN) injection 4 mg  4 mg IntraVENous Q6H PRN        Review of Systems  Constitutional: No fevers, No chills, No sweats, No fatigue, +Weakness  Eyes: No redness  Ears, nose, mouth, throat, and face: No nasal congestion, No sore throat, No voice change  Respiratory: No Shortness of Breath, No cough, No wheezing  Cardiovascular: No chest pain, No palpitations, No extremity edema  Gastrointestinal: No nausea, No vomiting, No  diarrhea, No abdominal pain  Genitourinary: No frequency, No dysuria, No hematuria  Integument/breast: No skin lesion(s)   Neurological: No Confusion, No headaches, No dizziness      Objective:     Visit Vitals  BP 137/84   Pulse 80   Temp 97.4 ??F (36.3 ??C)   Resp 14   Ht 5' (1.524 m)   Wt 43.4 kg (95 lb 10.9 oz)   SpO2 95%   Breastfeeding No   BMI 18.69 kg/m??      O2 Device: None (Room air)    Temp (24hrs), Avg:97.4 ??F (36.3 ??C), Min:97.3 ??F (36.3 ??C), Max:97.5 ??F (36.4 ??C)      No intake/output data recorded.  No intake/output data recorded.    PHYSICAL EXAM:  Constitutional: No acute distress  Skin: Extremities and face reveal no rashes.   HEENT: Sclerae anicteric. Extra-occular muscles are intact. No oral ulcers. The neck is supple and no masses.   Cardiovascular: RRR  Respiratory:  nonlabored  GI: Abdomen nondistended, soft, and nontender. Normal active bowel sounds.  Musculoskeletal: No pitting edema of the lower legs. Able to move all ext  Neurological:  Patient is alert and oriented. Cranial nerves II-XII grossly intact  Psychiatric: Mood appears appropriate       Data Review    Recent Results (from the past 24 hour(s))   METABOLIC PANEL, BASIC    Collection Time: 01/07/21  6:53 AM   Result Value Ref Range    Sodium 139 136 - 145 mmol/L    Potassium 3.8 3.5 - 5.1 mmol/L    Chloride 108 97 - 108 mmol/L    CO2 25 21 - 32 mmol/L    Anion gap 6 5 - 15 mmol/L    Glucose 88 65 - 100 mg/dL    BUN 23 (H) 6 - 20 mg/dL    Creatinine 0.76 0.55 - 1.02 mg/dL    BUN/Creatinine ratio 30 (H) 12 - 20      eGFR >60 >60 ml/min/1.50m    Calcium 9.1 8.5 - 10.1 mg/dL   CBC WITH AUTOMATED DIFF    Collection Time: 01/07/21  6:53 AM   Result Value Ref Range    WBC 11.4 (H) 3.6 - 11.0 K/uL    RBC 3.13 (L) 3.80 - 5.20 M/uL    HGB 10.0 (L) 11.5 - 16.0 g/dL    HCT 30.6 (L) 35.0 - 47.0 %    MCV 97.8 80.0 - 99.0 FL    MCH 31.9 26.0 - 34.0 PG    MCHC 32.7 30.0 - 36.5 g/dL    RDW 13.0 11.5 - 14.5 %    PLATELET 300 150 - 400 K/uL    MPV 8.8  (L) 8.9 - 12.9 FL    NRBC 0.0 0.0 PER 100 WBC    ABSOLUTE NRBC 0.00 0.00 - 0.01 K/uL    NEUTROPHILS 63 32 - 75 %    LYMPHOCYTES 23 12 - 49 %    MONOCYTES  8 5 - 13 %    EOSINOPHILS 4 0 - 7 %    BASOPHILS 0 0 - 1 %    IMMATURE GRANULOCYTES 2 (H) 0 - 0.5 %    ABS. NEUTROPHILS 7.1 1.8 - 8.0 K/UL    ABS. LYMPHOCYTES 2.6 0.8 - 3.5 K/UL    ABS. MONOCYTES 0.9 0.0 - 1.0 K/UL    ABS. EOSINOPHILS 0.4 0.0 - 0.4 K/UL    ABS. BASOPHILS 0.1 0.0 - 0.1 K/UL    ABS. IMM. GRANS. 0.2 (H) 0.00 - 0.04 K/UL    DF AUTOMATED     C REACTIVE PROTEIN, QT    Collection Time: 01/07/21  6:53 AM   Result Value Ref Range    C-Reactive protein 2.78 (H) 0.00 - 0.60 mg/dL         Radiology review: CT of head and C spine    Assessment:   1.  Sepsis syndrome etiology unclear  2.  Gait disturbance with recurrent fall/injury with spinal cord compression, multiple vertebral compression fractures, thoracic myelopathy  3.  Hypertension  4.  COPD without exacerbation  5.  Hyperlipidemia    Plan:   1.  WBC is trending down.  Inflammatory markers elevated.  Unclear source of infection.  Urinalysis unremarkable.  Blood cultures and urine cultures pending.  Rapid COVID and influenza negative.  Infectious disease consulted.  CT of the chest reviewed.  Switch to IV Zosyn.  Nuc med bone scan unremarkable  2.  CT of the head and cervical spine unremarkable.  Neurology consulted.  MRI of the and thoracic spine severely abnormal with spinal cord compression, compression fractures, thoracic myelopathy.  Neurosurgery consulted.  PT and OT evaluation.  Patient most likely will require placement. Per ortho surgeon no intervention needed at this time and to follow-up in the office in 3 to 4 weeks.  3.  Blood pressure stable.  Continue with metoprolol  4.  On Symbicort.  Oxygen saturation within normal limits on room air  5.  Continue atorvastatin  6.  CBC, BMP, inflammatory markers in a.m.    Dispo: patient medical stable for discharge. Discharge barriers include safe  discharge plan. CM notified     CODE STATUS full     DVT prophylaxis: Lovenox  Ulcer prophylaxis: Protonix    Care Plan discussed with: Patient/Family, Nurse, and Case Manager    Total time spent with patient: 33 minutes.

## 2021-01-07 NOTE — Progress Notes (Signed)
Progress Notes by Lorina Rabon, MD at 01/07/21 1801                Author: Lorina Rabon, MD  Service: --  Author Type: Physician       Filed: 01/07/21 2242  Date of Service: 01/07/21 1801  Status: Signed          Editor: Lorina Rabon, MD (Physician)                             Progress Note          Patient: [name]  MRN: [id number]   SSN: [id number]          Date of Birth: Jul 29, 1945   Age: 75 y.o.   Sex: female         Admit Date: 01/02/2021     LOS: 5 days         Subjective:     Patient with concern for sepsis, however, no obvious source of infection. Abnormal CT Chest suggests pneumonia though no cough or SOB.   She remains afebrile with decreasing WBC, CRP and procal.  Ceretec scan negative.  Patient resting comfortably with no new complaints.         Objective:          Vitals:             01/07/21 0442  01/07/21 0815  01/07/21 1021  01/07/21 1024           BP:  137/84      111/69     Pulse:  80      100     Resp:  14      16     Temp:  97.4 ??F (36.3 ??C)      97.9 ??F (36.6 ??C)     SpO2:  95%  95%    96%     Weight:                   Height:      5' (1.524 m)              Intake and Output:   Current Shift: No intake/output data recorded.   Last three shifts: No intake/output data recorded.      Physical Exam:    Vitals and nursing note reviewed.    Constitutional:        Appearance: She is not ill-appearing.    HENT:       Head: Normocephalic and atraumatic.         Comments: Flesh colored swelling left forehead, nontender, no erythema, no ecchymoses      Right Ear: External ear normal.       Left Ear: External ear normal.       Mouth/Throat:       Mouth: Mucous membranes are moist.    Eyes:       Pupils: Pupils are equal, round, and reactive to light.    Cardiovascular:       Rate and Rhythm: Normal rate and regular rhythm.    Pulmonary:       Effort: Pulmonary effort is normal.       Breath sounds: Normal breath sounds.    Abdominal:       General: Bowel sounds are normal. There is  no distension.       Palpations: Abdomen is soft.       Tenderness:  There is no abdominal tenderness.    Musculoskeletal:       Cervical back: Neck supple.       Right lower leg: No edema.       Left lower leg: No edema.    Skin:      Findings: No lesion or rash.    Neurological:       General: No focal deficit present.       Mental Status: She is alert and oriented to person, place, and time.    Psychiatric:          Behavior: Behavior normal.       Lab/Data Review:       WBC 11,400      CRP 2.78 <4.62 <8.14 <16.50    Procal <0.05 <0.07 <0.17 <0.23 <0.35      Blood cultures (12/11) No growth 4 days   Urine culture (12/11) No growth FINAL      MRI Thoracic spine w/o contrast (12/13)   1.  Subacute-appearing severe T10 compression fracture with osseous   retropulsion. 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 Associated T9-T11 Mild-to-moderate spinal   canal and neuroforaminal stenoses.   2.  T6-T7 Central disc extrusion with mild spinal stenosis at this level.   3.  Chronic T7 compression deformity.   4.  Scattered signal abnormalities in the bilateral lungs and a small right   pleural effusion may reflect focal pneumonia. Dedicated chest CT recommended for   further characterization.              CT Chest (12/13)  Minimal right basilar atelectasis/consolidation with trace right-sided effusion.   Chronic T5, T7 and T10 vertebral compression fractures. Moderate coronary vascular calcifications.           Ceretec scan (12/13) Unremarkable Whole Body WBC Scan.              Assessment:        Principal Problem:     Sepsis (HCC) (01/02/2021)   1.  Presumptive sepsis with leukocytosis, elevated CRP and procal,  etiology  unclear, resolving Day #6 IV antibiotics, now Zosyn   2.  Possible RLL pneumonia, Day #6 IV Zosyn   3.  Soft tissue swelling left forehead, no signs of infection   4.  Frequent falls with dizziness   5.  Babinksi and hyperreflexia (per  Neurology), ruling out cord compression   6.  Possible pneumonia (per MRI)   7.     Comment: Sepsis parameters improving with source presumed to be RLL pneumonia, despite absence or paucity of respiratory symptoms and negative Ceretec scan. WBC, CRP and procal  decreasing.        Plan:     1.  Reasonable to transition to Augmentin 875 mg BID for 10 days tomorrow   2.  Cleared for discharge from ID standpoint   3.  Follow-up blood cultures until finalized             Signed By:  Lorina Rabon, MD           January 07, 2021

## 2021-01-07 NOTE — Progress Notes (Deleted)
OCCUPATIONAL THERAPY TREATMENT  Patient: [name] (75 y.o. female)  Date: 01/07/2021  Diagnosis: Sepsis (HCC) [A41.9] Sepsis (HCC)      Precautions: FALL  Chart, occupational therapy assessment, plan of care, and goals were reviewed.    ASSESSMENT  Pt continues with skilled OT services and is progressing towards goals. Pt received semi-supine in bed upon arrival, AXO x4 and agreeable to COTA tx at this time. Pt cooperative and demonstrated good effort during activities. Pt tolerated therapy session  fairly with c/o headache 3/10 which improved from earlier. Pt continues to need review of back precautions as pt unable to recall 1/3 (no twisting) and looks to the board for answers. Pt needs cuing for correct form for log rolling using bed rail, seated EOB pt presented good sitting balance. Pt requires CGA for bed>toilet>sink>bed tf using bed rail with continued cuing for RW mgmt with slow gait. Pt able to perform simple toileting hygiene in standing with assistance for steadying. Pt able to perform simple grooming with SBA/set-up. Pt req'd vc's for RW mgmt and adhering to back precautions when turning. EOB, pt engaged in bil UE exercises to improve overall strength to perform ADl'S, see grid below.  Overall, pt continues to present with deficits in generalized strength/AROM, static/dynamic standing balance, adhering to back precautions and functional activity tolerance during performance of ADLs/mobility (see below for objective details and assist levels).  Will continue to progress. Recommend d/c to SNF once medically appropriate.     Other factors to consider for discharge: Time of onset, medical prognosis/diagnosis, severity of deficits, PLOF, functional baseline, home environment, and family support          PLAN :  Patient continues to benefit from skilled intervention to address the above impairments.  Continue treatment per established plan of care.  to address goals.    Recommend with staff: Out of bed  to chair for meals and Encourage HEP in prep for ADLs/mobility    Recommend next session: UB dressing and Standing grooming    Recommendation for discharge: (in order for the patient to meet his/her long term goals)  Skilled Nursing Facility    This discharge recommendation:  Has been made in collaboration with the attending provider and/or case management       IF patient discharges home will need the following DME: TBD       SUBJECTIVE:   Patient stated "My headache is better."    OBJECTIVE DATA SUMMARY:   Cognitive/Behavioral Status:  Neurologic State: Alert  Orientation Level: Oriented X4  Cognition: Follows commands             Functional Mobility and Transfers for ADLs:  Bed Mobility:  Rolling: Stand-by assistance  Supine to Sit: Stand-by assistance  Sit to Supine: Stand-by assistance  Scooting: Stand-by assistance    Transfers:  Sit to Stand: Contact guard assistance  Functional Transfers  Bathroom Mobility: Contact guard assistance  Toilet Transfer : Contact guard assistance       Balance:  Sitting: Intact;Without support  Standing: Impaired;With support  Standing - Static: Good;Constant support  Standing - Dynamic : Fair;Constant support    ADL Intervention:       Grooming  Grooming Assistance: Stand-by assistance;Set-up  Position Performed: Standing  Washing Face: Stand-by assistance;Set-up  Washing Hands: Stand-by assistance;Set-up       Toileting  Toileting Assistance: Contact guard assistance  Bladder Hygiene: Contact guard assistance  Clothing Management: Contact guard assistance  Cues: Verbal cues provided  Exercise Sets Reps AROM AAROM PROM Self PROM Comments   Elbow E/F 2 15 [x]   []  []  EOB   Chest press 2 15 [x]   []  []     Wrist E/F 2 15 [x]        Digit E/F 2 15 [x]             Pain:  3/10 headache    Activity Tolerance:   Fair    After treatment patient left in no apparent distress:   Supine in bed, Call bell within reach, Bed / chair alarm activated, and Side rails x 3, bed locked and in  lowest position    COMMUNICATION/COLLABORATION:   The patient's plan of care was discussed with: Physical therapy assistant and Registered nurse.     , COTA  Time Calculation: 25 mins     Problem: Self Care Deficits Care Plan (Adult)  Goal: *Acute Goals and Plan of Care (Insert Text)  Description: FUNCTIONAL STATUS PRIOR TO ADMISSION: Pt reports she was IND w/ functional mobility/ADLs prior to admission. She reports at least 5 falls within the last 3 months.     HOME SUPPORT: Pt lives by herself and neighbors check in on her some.    Occupational Therapy Goals  Initiated 01/06/2021    Pt stated goal I want to get better.  Pt will be IND sup <> sit in prep for EOB ADLs  Pt will be mod I grooming standing sink side LRAD  Pt will be IND UB dressing sitting EOB/long sit   Pt will be IND LE dressing sitting EOB/long sit  Pt will be Mod I sit <> stand in prep for toileting LRAD  Pt will be Mod I toileting/toilet transfer/cloth mgmt LRAD  Pt will be IND following UE HEP in prep for self care tasks   Outcome: Progressing Towards Goal

## 2021-01-07 NOTE — Progress Notes (Signed)
Nutrition Assessment     Type and Reason for Visit: Reassess (interim)    Nutrition Recommendations/Plan:   Continue current diet  Continue ensure enlive 3x/day  Monitor and record PO intakes, supplement acceptance, and Bms in I/Os     Nutrition Assessment:  Patient admitted d/t fall at home. CT of head unremarkable. WBC elevated, sepsis criteria meet and patient given fluids. Antibiotic given for presumed UTI. Patient endorses gradual decrease in po intake since husband passed away and weight loss of about 12# in the past 2 years. Patient states that she does eat well here "if I like it, I eat it". She does take Ensure at home and would like to receive here. (12/16) Ceretec scan negative. CT chest suggests PNA. Plan to discharge SNF. Intakes stable, baseline. Labs: Na 139, K 3.8, BUN 23, Creat 0.76, Gluc 88, Alb 3.0. Meds: atorvastatin, pantoprazole    Malnutrition Assessment:  Malnutrition Status: Moderate malnutrition     Estimated Daily Nutrient Needs:  Energy (kcal):  1300-1500 kcals/day (30-35 kcals/kg)  Protein (g):  44-52 grams/day (1.0-1.2 grams/kg)       Fluid (ml/day):  1300-1500 mls day (30-35 mls/kg)    Nutrition Related Findings:  No n/v, d/c, or problems chewing/swallowing. No edema. BM 12/14.    Current Nutrition Therapies:  ADULT DIET Regular  ADULT ORAL NUTRITION SUPPLEMENT Breakfast, Lunch, Dinner; Standard High Calorie/High Protein    Anthropometric Measures:  Height:  5' (152.4 cm)  Current Body Wt:  43.4 kg (95 lb 10.9 oz)  BMI: 18.7    Nutrition Diagnosis:   (P) Moderate malnutrition, In context of acute illness or injury related to (P) inadequate protein-energy intake as evidenced by (P) Criteria as identified in malnutrition assessment    Nutrition Interventions:   Food and/or Nutrient Delivery: (P) Continue current diet, Continue oral nutrition supplement  Nutrition Education/Counseling: (P) No recommendations at this time  Coordination of Nutrition Care: (P) Continue to monitor while  inpatient  Plan of Care discussed with: RN    Goals:  Previous Goal Met: (P) Progressing toward goal(s)  Goals: (P) PO intake 75% or greater, by next RD assessment    Nutrition Monitoring and Evaluation:   Behavioral-Environmental Outcomes: (P) None identified  Food/Nutrient Intake Outcomes: (P) Food and nutrient intake, Supplement intake  Physical Signs/Symptoms Outcomes: (P) Meal time behavior, Weight    Discharge Planning:    (P) Too soon to determine    Nicholes Calamity, RD  Contact: [phone]

## 2021-01-07 NOTE — Progress Notes (Signed)
DC Plan: SNF    CM met with pt at the bedside to f/up on her dc plan. PT/OT is recommending SNF. Pt is agreeable with going to SNF for short term. Cm received choice for [facility]. Pt wanted to know if she could go straight home from the hospital to wash her clothes so she would have something to wear at rehab. Pt indicated she will need at least a day at home to do all that and she can go to rehab the next day. Cm informed pt Cm will ask [facility] if she could do that.  Referral made via [place].     ___________________________________________________    [facility] rehab liaison and Probation officer spoke with pt at the bedside regarding her request to go home for a day or two then to rehab. Rehab liaison explained safety concern regarding her request. Pt understands that she will need to go straight from the hospital to the rehab center. Pt is going to call her elderly friend to see if she can gather some belongings for her. Transportation was discussed. If pt's friend is unable to provide transportation, a cab will need to be arranged to take pt to the rehab center. [facility] rehab will provide clothing for pt at their facility.     Reference# for auth: [id number]

## 2021-01-07 NOTE — Progress Notes (Signed)
Problem: Falls - Risk of  Goal: *Absence of Falls  Description: Document [name] Fall Risk and appropriate interventions in the flowsheet.  Outcome: Progressing Towards Goal  Note: Fall Risk Interventions:  Mobility Interventions: Assess mobility with egress test, Bed/chair exit alarm, Communicate number of staff needed for ambulation/transfer, Patient to call before getting OOB, PT Consult for mobility concerns, PT Consult for assist device competence, Utilize walker, cane, or other assistive device    Mentation Interventions: Bed/chair exit alarm, Door open when patient unattended, Family/sitter at bedside, More frequent rounding, Reorient patient, Room close to nurse's station, Toileting rounds, Update white board    Medication Interventions: Assess postural VS orthostatic hypotension, Bed/chair exit alarm, Teach patient to arise slowly, Patient to call before getting OOB    Elimination Interventions: Bed/chair exit alarm, Call light in reach, Patient to call for help with toileting needs, Stay With Me (per policy), Toileting schedule/hourly rounds    History of Falls Interventions: Bed/chair exit alarm, Door open when patient unattended, Room close to nurse's station         Problem: Patient Education: Go to Patient Education Activity  Goal: Patient/Family Education  Outcome: Progressing Towards Goal     Problem: Pressure Injury - Risk of  Goal: *Prevention of pressure injury  Description: Document Braden Scale and appropriate interventions in the flowsheet.  Outcome: Progressing Towards Goal  Note: Pressure Injury Interventions:  Sensory Interventions: Assess changes in LOC    Moisture Interventions: Absorbent underpads, Check for incontinence Q2 hours and as needed, Minimize layers, Moisture barrier    Activity Interventions: Increase time out of bed, PT/OT evaluation    Mobility Interventions: HOB 30 degrees or less    Nutrition Interventions: Offer support with meals,snacks and hydration    Friction and  Shear Interventions: Apply protective barrier, creams and emollients                Problem: Patient Education: Go to Patient Education Activity  Goal: Patient/Family Education  Outcome: Progressing Towards Goal     Problem: Patient Education: Go to Patient Education Activity  Goal: Patient/Family Education  Outcome: Progressing Towards Goal     Problem: Patient Education: Go to Patient Education Activity  Goal: Patient/Family Education  Outcome: Progressing Towards Goal

## 2021-01-07 NOTE — Progress Notes (Signed)
 PHYSICAL THERAPY TREATMENT  Patient: [name] (75 y.o. female)  Date: 01/07/2021  Diagnosis: Sepsis (HCC) [A41.9] Sepsis (HCC)      Precautions: Back  Chart, physical therapy assessment, plan of care and goals were reviewed.    ASSESSMENT  Patient continues with skilled PT services and is progressing towards goals. Pt supine in bed upon PT arrival, agreeable to session. (See below for objective details and assist levels). Overall pt tolerated session fairly today. Patient presented with improved functional mobility and activity tolerance with slightly reduced need for assistance throughout entire session. Patient able to recall 2/3 of her spinal precautions. Patient re educated on all spinal precautions and log rolling to come to EOB. Patient then performed STS to RW needing min VC to push from bed and not pull on walker. Patient then ambulated 8 feet to rest room with RW demonstrating very short shuffled step to gait pattern with hips slightly extended forward but no LOB or knee buckling. Patient able to use toilet and perform peri care independently and stood to wash hands at sink at [facility] before ambulating back to bed with same gait pattern. Patient then sat EOB where patient reported sudden headache and dizziness. Patients BP taken at 141/82. Patient then declined further mobility so was returned to supine in bed with call bell within reach and all needs meet. Nursing alerted to patients headache/dizziness.    Current Level of Function Impacting Discharge (mobility/balance): impaired balance, general weakness, poor activity tolerance    Other factors to consider for discharge: PLOF, assistance at home, level of deficits,a cute medical state         PLAN :  Patient continues to benefit from skilled intervention to address the above impairments.  Continue treatment per established plan of care to address goals.    Recommendation for discharge: (in order for the patient to meet his/her long term goals)  Skilled  Nursing Facility    This discharge recommendation:  Has been made in collaboration with the attending provider and/or case management    IF patient discharges home will need the following DME: to be determined (TBD)       SUBJECTIVE:   Patient stated "I think I better go to the rest room before I do anything else."    OBJECTIVE DATA SUMMARY:   Critical Behavior:  Neurologic State: Alert  Orientation Level: Oriented X4  Cognition: Follows commands     Functional Mobility Training:  Bed Mobility:  Rolling: Stand-by assistance  Supine to Sit: Stand-by assistance  Sit to Supine: Stand-by assistance  Scooting: Stand-by assistance  Transfers:  Sit to Stand: Contact guard assistance  Stand to Sit: Contact guard assistance  Balance:  Sitting: Intact;Without support  Standing: Impaired;With support  Standing - Static: Good;Constant support  Standing - Dynamic : Fair;Constant support  Ambulation/Gait Training:  Distance (ft): 18 Feet (ft)  Assistive Device: Gait belt;Walker, rolling  Ambulation - Level of Assistance: Contact guard assistance  Gait Abnormalities: Shuffling gait;Step to gait  Speed/Cadence: Slow;Shuffled  Step Length: Right shortened;Left shortened  Pain Rating:  6-7/10 headache    Activity Tolerance:   Fair and requires rest breaks    After treatment patient left in no apparent distress:   Bed locked and returned to lowest position, Supine in bed, Call bell within reach, Bed / chair alarm activated, and Side rails x 3      COMMUNICATION/COLLABORATION:   The patient's plan of care was discussed with: Registered nurse.     GOALS:  Problem: Mobility Impaired (Adult and Pediatric)  Goal: *Acute Goals and Plan of Care (Insert Text)  Description: FUNCTIONAL STATUS PRIOR TO ADMISSION: Patient was modified independent using a rolling walker for functional mobility. Per pt report, she had 5 falls in past 3 months.        HOME SUPPORT PRIOR TO ADMISSION: The patient lived alone with neighbor to provide  assistance.    Physical Therapy Goals  Initiated 01/06/2021     Patient/family stated goal: I want to go home  1.  Patient will move from supine to sit and sit to supine  in bed with independence within 7 day(s).    2.  Patient will transfer from bed to chair and chair to bed with modified independence using the least restrictive device within 7 day(s).  3.  Patient will perform sit to stand with modified independence within 7 day(s).  4.  Patient will ambulate with modified independence for 150 feet with the least restrictive device within 7 day(s).   5.  Patient will participate in lower extremity therapeutic exercise with independence within 7 day(s).    6.  Patient will verbalize and demonstrate understanding of spinal precautions (No bending, lifting greater than 5 lbs, or twisting; log-roll technique; frequent repositioning as instructed) within 7 days.         Outcome: Progressing Towards Goal       Donnice Marsh, PTA, PT   Time Calculation: 25 mins

## 2021-01-07 NOTE — Discharge Summary (Signed)
Discharge Summary by Margie Ege, PA-C at 01/07/21 1017                Author: Margie Ege, PA-C  Service: Hospitalist  Author Type: Physician Assistant       Filed: 01/07/21 1018  Date of Service: 01/07/21 1017  Status: Signed           Editor: Margie Ege, PA-C (Physician Assistant)  Cosigner: Sharlett Iles, MD at 01/07/21 1735                                              Hospitalist Discharge Summary        Patient ID:     [name]   [id number]   75 y.o.   1945/05/15      Admit date: 01/02/2021      Discharge date : 01/07/2021      Final Diagnoses:    1.  Sepsis syndrome etiology unclear   2.  Gait disturbance with recurrent fall/injury with spinal cord compression, multiple vertebral compression fractures, thoracic myelopathy   3.  Hypertension   4.  COPD without exacerbation   5.  Hyperlipidemia      Reason for Hospitalization: [facility] Course: Patient52 y.o. female  with history of COPD stable on treatment maintenance inhalers, hyperlipidemia, history of recurrent falls with generalized weakness suspected of cardiac syncope presents to the emergency room  complaining of recurrent falls.  Last 3 days she was falling and injuring herself and she does not know how she is falling.  Admits dizziness  with ambulation.  Denies any chest pain or palpitations.  Denies any fever or chills.  Admits appetite is decreased and not eating well.        Evaluation in the emergency room significant leukocytosis with left shift, negative evidence of infection, chest x-ray did not show any pneumonia.   Urine analysis is clear, skin with no wounds.  Negative for COVID-19, influenza.  She seems to have infection but source is unclear at this time. ID consulted.  Inflammatory markers elevated.  Started  on ceftaroline and then transition to IV Zosyn.  CT of the chest showed mild right atelectasis/consolidation with trace right sided fusion with chronic T5, T7, T10 vertebral compression fractures,  moderate coronary vascular calcifications nuclear medicine  bone scan unremarkable. Transition to oral Augmentin per ID for 10 more days       Neurology consulted.  CT of the head and cervical spine unremarkable.  Per the neurology note no further work-up warranted.  Recommend PT and OT evaluation.  Patient most likely will need placement  to rehab.  Orthostatics positive.  Given a fluid bolus.  MRI of the cervical and thoracic spine showed no acute fracture or subluxation of the cervical spine with multilevel generative changes, disc disease, listlessness contributing to multilevel  mild to moderate spinal canal stenosis and varying degrees of neural foraminal stenosis, subacute appearing severe T10 compression fracture with retropulsion, moderate to severe spinal cord compression with findings of subtle compression myopathy at this  level, associated T9-T11 mild to moderate spinal canal and neuroforaminal stenosis, T6-T7 central disc extrusion with mild spinal stenosis, chronic T7 compression deformity.  Per orthopedic spinal surgeon will defer surgery at this time and recommend  physical therapy follow-up with them in office in 3 to  4 weeks         Discharge Medications:      Current Discharge Medication List                 START taking these medications          Details        amitriptyline (ELAVIL) 75 mg tablet  Take 1 Tablet by mouth nightly for 30 days.   Qty: 30 Tablet, Refills: 0   Start date: 01/07/2021, End date: 02/06/2021               amoxicillin-clavulanate (Augmentin) 875-125 mg per tablet  Take 1 Tablet by mouth two (2) times a day for 10 days.   Qty: 20 Tablet, Refills: 0   Start date: 01/07/2021, End date: 01/17/2021                        CONTINUE these medications which have NOT CHANGED          Details        acetaminophen (TYLENOL) 325 mg tablet  Take 325 mg by mouth every four (4) hours as needed for Pain (Take 2 tablets by mouth as needed for pain).               atorvastatin (LIPITOR) 40 mg  tablet  Take 40 mg by mouth daily.               metoprolol succinate (TOPROL-XL) 25 mg XL tablet  Take 25 mg by mouth daily.               fluticasone-umeclidin-vilanter (TRELEGY ELLIPTA) 200-62.5-25 mcg inhaler  Trelegy Ellipta 200 mcg-62.5 mcg-25 mcg powder for inhalation    INHALE 1 PUFF BY MOUTH ONCE A DAY               albuterol (PROVENTIL HFA, VENTOLIN HFA, PROAIR HFA) 90 mcg/actuation inhaler  albuterol sulfate HFA 90 mcg/actuation aerosol inhaler    INHALE 2 PUFFS EVERY 6 HOURS AS NEEDED               omeprazole (PRILOSEC) 20 mg capsule  Take 20 mg by mouth daily.                        STOP taking these medications                  etodolac (LODINE) 400 mg tablet  Comments:    Reason for Stopping:                      rosuvastatin (CRESTOR) 10 mg tablet  Comments:    Reason for Stopping:                                Follow up Care:     1. Cioflec, Doran Durand, MD in 1-2 weeks.          Follow-up Information                  Follow up With  Specialties  Details  Why  Contact Info              Alois Cliche, MD  Orthopedic Surgery  Follow up in 3 week(s)    [address]   [place] [address]   [phone]  Cioflec, Doran Durand, MD  Internal Medicine Physician  Follow up in 2 week(s)    [address]   [address]   [place] [address]   [phone]                           Patient Follow Up Instructions:    Activity: Activity as tolerated   Diet:  Cardiac Diet   Wound care: None Needed      Condition at Discharge:  Stable   __________________________________________________________________      Disposition   SNF/LTC   ____________________________________________________________________      Code Status: Full Code   ___________________________________________________________________      Discharge Exam:   Patient seen and examined by me on discharge day.   Pertinent Findings:   Gen:    Not in distress   Chest: Clear lungs   CVS:   Regular rhythm.  No edema   Abd:  Soft, not  distended, not tender   Neuro:  Alert      CONSULTATIONS: ID, Neurology, and Orthopedic Surgery      Significant Diagnostic Studies:    01/02/2021: BUN 22 mg/dL (H; Ref range: 6 - 20 mg/dL); Calcium 9.1 mg/dL (Ref range: 8.5 - 32.9 mg/dL); CO2 20 mmol/L (L; Ref range: 21 - 32 mmol/L); Creatinine 0.85 mg/dL (Ref range: 5.18 - 8.41 mg/dL); Glucose 134 mg/dL (H; Ref range: 65 - 660 mg/dL);  HCT 63.0 % (Ref range: 35.0 - 47.0 %); HGB 11.9 g/dL (Ref range: 16.0 - 10.9 g/dL); Potassium 4.3 mmol/L (Ref range: 3.5 - 5.1 mmol/L); Sodium 137 mmol/L (Ref range: 136 - 145 mmol/L)   01/03/2021: BUN 15 mg/dL (Ref range: 6 - 20 mg/dL); Calcium 9.0 mg/dL (Ref range: 8.5 - 32.3 mg/dL); CO2 23 mmol/L (Ref range: 21 - 32 mmol/L); Creatinine 0.76 mg/dL (Ref range: 5.57 - 3.22 mg/dL); Glucose 118 mg/dL (H; Ref range: 65 - 025 mg/dL); HCT  42.7 % (L; Ref range: 35.0 - 47.0 %); HGB 10.8 g/dL (L; Ref range: 06.2 - 37.6 g/dL); Potassium 3.6 mmol/L (Ref range: 3.5 - 5.1 mmol/L); Sodium 139 mmol/L (Ref range: 136 - 145 mmol/L)     Recent Labs            01/07/21   0653  01/06/21   0641     WBC  11.4*  11.4*     HGB  10.0*  10.0*     HCT  30.6*  31.3*         PLT  300  298          Recent Labs             01/07/21   0653  01/06/21   0641  01/05/21   0659     NA  139  139  141     K  3.8  3.8  3.7     CL  108  111*  112*     CO2  25  22  22      BUN  23*  18  21*     CREA  0.76  0.79  0.83     GLU  88  96  100          CA  9.1  8.8  9.0        No results for input(s): ALT, AP, TBIL, TBILI, TP, ALB, GLOB, GGT, AML, LPSE in the last 72 hours.  No lab exists for component: SGOT, GPT, AMYP, HLPSE   No results for input(s): INR, PTP, APTT, INREXT in the last 72 hours.    No results for input(s): FE, TIBC, PSAT, FERR in the last 72 hours.    No results for input(s): PH, PCO2, PO2 in the last 72 hours.   No results for input(s): CPK, CKMB in the last 72 hours.      No lab exists for component: TROPONINI   No results found for: GLUCPOC         Total  Time: >20m in       Signed:   Margie Ege, PA-C   01/07/2021   10:17 AM

## 2021-01-08 LAB — CULTURE, BLOOD, PAIRED
Culture result:: NO GROWTH
Culture: NO GROWTH

## 2021-01-08 MED FILL — METOPROLOL SUCCINATE SR 50 MG 24 HR TAB: 50 mg | ORAL | Qty: 1

## 2021-01-08 MED FILL — AMITRIPTYLINE 25 MG TAB: 25 mg | ORAL | Qty: 1

## 2021-01-08 MED FILL — ATORVASTATIN 40 MG TAB: 40 mg | ORAL | Qty: 1

## 2021-01-08 MED FILL — SYMBICORT 160 MCG-4.5 MCG/ACTUATION HFA AEROSOL INHALER: RESPIRATORY_TRACT | Qty: 6

## 2021-01-08 MED FILL — ENOXAPARIN 30 MG/0.3 ML SUB-Q SYRINGE: 30 mg/0.3 mL | SUBCUTANEOUS | Qty: 0.3

## 2021-01-08 MED FILL — PIPERACILLIN-TAZOBACTAM 3.375 GRAM IV SOLR: 3.375 gram | INTRAVENOUS | Qty: 3.38

## 2021-01-08 MED FILL — SPIRIVA RESPIMAT 2.5 MCG/ACTUATION SOLUTION FOR INHALATION: 2.5 mcg/actuation | RESPIRATORY_TRACT | Qty: 4

## 2021-01-08 MED FILL — TYLENOL 325 MG TABLET: 325 mg | ORAL | Qty: 2

## 2021-01-08 MED FILL — PANTOPRAZOLE 40 MG TAB, DELAYED RELEASE: 40 mg | ORAL | Qty: 1

## 2021-01-08 NOTE — Progress Notes (Signed)
Problem: Falls - Risk of  Goal: *Absence of Falls  Description: Document Bridgette Habermann Fall Risk and appropriate interventions in the flowsheet.  Outcome: Progressing Towards Goal  Note: Fall Risk Interventions:  Mobility Interventions: Bed/chair exit alarm, PT Consult for mobility concerns    Mentation Interventions: Bed/chair exit alarm, Adequate sleep, hydration, pain control, Room close to nurse's station, Update white board      Medication Interventions: Bed/chair exit alarm    Elimination Interventions: Bed/chair exit alarm, Call light in reach    History of Falls Interventions: Bed/chair exit alarm         Problem: Patient Education: Go to Patient Education Activity  Goal: Patient/Family Education  Outcome: Progressing Towards Goal     Problem: Pressure Injury - Risk of  Goal: *Prevention of pressure injury  Description: Document Braden Scale and appropriate interventions in the flowsheet.  Outcome: Progressing Towards Goal  Note: Pressure Injury Interventions:  Sensory Interventions: Minimize linen layers, Keep linens dry and wrinkle-free    Moisture Interventions: Absorbent underpads, Minimize layers    Activity Interventions: Increase time out of bed    Mobility Interventions: PT/OT evaluation    Nutrition Interventions: Offer support with meals,snacks and hydration    Friction and Shear Interventions: Minimize layers

## 2021-01-08 NOTE — Progress Notes (Signed)
CM noted discharge order.  Patient is currently pending authorization to go to [facility].  CM has reached out to [facility] to get update on auth.  CM will continue to follow up.

## 2021-01-08 NOTE — Progress Notes (Signed)
Problem: Falls - Risk of  Goal: *Absence of Falls  Description: Document [name] Fall Risk and appropriate interventions in the flowsheet.  Outcome: Progressing Towards Goal  Note: Fall Risk Interventions:  Mobility Interventions: Bed/chair exit alarm, Communicate number of staff needed for ambulation/transfer, Patient to call before getting OOB, PT Consult for mobility concerns, PT Consult for assist device competence    Mentation Interventions: Bed/chair exit alarm, Door open when patient unattended, More frequent rounding, Reorient patient, Update white board, Toileting rounds, Room close to nurse's station    Medication Interventions: Bed/chair exit alarm    Elimination Interventions: Bed/chair exit alarm, Call light in reach, Patient to call for help with toileting needs, Stay With Me (per policy), Toileting schedule/hourly rounds    History of Falls Interventions: Bed/chair exit alarm, Door open when patient unattended, Room close to nurse's station         Problem: Patient Education: Go to Patient Education Activity  Goal: Patient/Family Education  Outcome: Progressing Towards Goal     Problem: Pressure Injury - Risk of  Goal: *Prevention of pressure injury  Description: Document Braden Scale and appropriate interventions in the flowsheet.  Outcome: Progressing Towards Goal  Note: Pressure Injury Interventions:  Sensory Interventions: Minimize linen layers    Moisture Interventions: Apply protective barrier, creams and emollients, Check for incontinence Q2 hours and as needed, Minimize layers, Moisture barrier    Activity Interventions: Increase time out of bed    Mobility Interventions: PT/OT evaluation    Nutrition Interventions: Offer support with meals,snacks and hydration    Friction and Shear Interventions: Minimize layers                Problem: Patient Education: Go to Patient Education Activity  Goal: Patient/Family Education  Outcome: Progressing Towards Goal     Problem: Patient Education: Go to  Patient Education Activity  Goal: Patient/Family Education  Outcome: Progressing Towards Goal     Problem: Patient Education: Go to Patient Education Activity  Goal: Patient/Family Education  Outcome: Progressing Towards Goal

## 2021-01-08 NOTE — Discharge Summary (Signed)
Discharge Summary by Margie Ege, PA-C at 01/08/21 0831                Author: Margie Ege, PA-C  Service: Hospitalist  Author Type: Physician Assistant       Filed: 01/08/21 0832  Date of Service: 01/08/21 0831  Status: Signed           Editor: Margie Ege, PA-C (Physician Assistant)  Cosigner: Sharlett Iles, MD at 01/08/21 1108                                              Hospitalist Discharge Summary        Patient ID:     Unnamed [name]   [id number]   75 y.o.   04/02/1945      Admit date: 01/02/2021      Discharge date : 01/08/2021      Final Diagnoses:    1.  Sepsis syndrome etiology unclear   2.  Gait disturbance with recurrent fall/injury with spinal cord compression, multiple vertebral compression fractures, thoracic myelopathy   3.  Hypertension   4.  COPD without exacerbation   5.  Hyperlipidemia      Reason for Hospitalization: [facility] Course: Patient45 y.o. female  with history of COPD stable on treatment maintenance inhalers, hyperlipidemia, history of recurrent falls with generalized weakness suspected of cardiac syncope presents to the emergency room  complaining of recurrent falls.  Last 3 days she was falling and injuring herself and she does not know how she is falling.  Admits dizziness  with ambulation.  Denies any chest pain or palpitations.  Denies any fever or chills.  Admits appetite is decreased and not eating well.        Evaluation in the emergency room significant leukocytosis with left shift, negative evidence of infection, chest x-ray did not show any pneumonia.   Urine analysis is clear, skin with no wounds.  Negative for COVID-19, influenza.  She seems to have infection but source is unclear at this time. ID consulted.  Inflammatory markers elevated.  Started  on ceftaroline and then transition to IV Zosyn.  CT of the chest showed mild right atelectasis/consolidation with trace right sided fusion with chronic T5, T7, T10 vertebral compression fractures,  moderate coronary vascular calcifications nuclear medicine  bone scan unremarkable. Transition to oral Augmentin per ID for 10 more days       Neurology consulted.  CT of the head and cervical spine unremarkable.  Per the neurology note no further work-up warranted.  Recommend PT and OT evaluation.  Patient most likely will need placement  to rehab.  Orthostatics positive.  Given a fluid bolus.  MRI of the cervical and thoracic spine showed no acute fracture or subluxation of the cervical spine with multilevel generative changes, disc disease, listlessness contributing to multilevel  mild to moderate spinal canal stenosis and varying degrees of neural foraminal stenosis, subacute appearing severe T10 compression fracture with retropulsion, moderate to severe spinal cord compression with findings of subtle compression myopathy at this  level, associated T9-T11 mild to moderate spinal canal and neuroforaminal stenosis, T6-T7 central disc extrusion with mild spinal stenosis, chronic T7 compression deformity.  Per orthopedic spinal surgeon will defer surgery at this time and recommend  physical therapy follow-up with them in office in 3 to  4 weeks      Discharge delayed due to insurance auth and SNF placement          Discharge Medications:      Current Discharge Medication List                 START taking these medications          Details        amitriptyline (ELAVIL) 75 mg tablet  Take 1 Tablet by mouth nightly for 30 days.   Qty: 30 Tablet, Refills: 0   Start date: 01/07/2021, End date: 02/06/2021               amoxicillin-clavulanate (Augmentin) 875-125 mg per tablet  Take 1 Tablet by mouth two (2) times a day for 10 days.   Qty: 20 Tablet, Refills: 0   Start date: 01/07/2021, End date: 01/17/2021                        CONTINUE these medications which have NOT CHANGED          Details        acetaminophen (TYLENOL) 325 mg tablet  Take 325 mg by mouth every four (4) hours as needed for Pain (Take 2 tablets by mouth  as needed for pain).               atorvastatin (LIPITOR) 40 mg tablet  Take 40 mg by mouth daily.               metoprolol succinate (TOPROL-XL) 25 mg XL tablet  Take 25 mg by mouth daily.               fluticasone-umeclidin-vilanter (TRELEGY ELLIPTA) 200-62.5-25 mcg inhaler  Trelegy Ellipta 200 mcg-62.5 mcg-25 mcg powder for inhalation    INHALE 1 PUFF BY MOUTH ONCE A DAY               albuterol (PROVENTIL HFA, VENTOLIN HFA, PROAIR HFA) 90 mcg/actuation inhaler  albuterol sulfate HFA 90 mcg/actuation aerosol inhaler    INHALE 2 PUFFS EVERY 6 HOURS AS NEEDED               omeprazole (PRILOSEC) 20 mg capsule  Take 20 mg by mouth daily.                        STOP taking these medications                  etodolac (LODINE) 400 mg tablet  Comments:    Reason for Stopping:                      rosuvastatin (CRESTOR) 10 mg tablet  Comments:    Reason for Stopping:                                Follow up Care:     1. Cioflec, Doran Durand, MD in 1-2 weeks.          Follow-up Information                  Follow up With  Specialties  Details  Why  Contact Info              Alois Cliche, MD  Orthopedic Surgery  Follow up in 3 week(s)    [address]  [place] [address]   [phone]                 Cioflec, Doran Durand, MD  Internal Medicine Physician  Follow up in 2 week(s)    [address]   [address]   [place] [address]   [phone]                           Patient Follow Up Instructions:    Activity: Activity as tolerated   Diet:  Cardiac Diet   Wound care: None Needed      Condition at Discharge:  Stable   __________________________________________________________________      Disposition   SNF/LTC   ____________________________________________________________________      Code Status: Full Code   ___________________________________________________________________      Discharge Exam:   Patient seen and examined by me on discharge day.   Pertinent Findings:   Gen:    Not in distress    Chest: Clear lungs   CVS:   Regular rhythm.  No edema   Abd:  Soft, not distended, not tender   Neuro:  Alert      CONSULTATIONS: ID, Neurology, and Orthopedic Surgery      Significant Diagnostic Studies:    01/02/2021: BUN 22 mg/dL (H; Ref range: 6 - 20 mg/dL); Calcium 9.1 mg/dL (Ref range: 8.5 - 18.5 mg/dL); CO2 20 mmol/L (L; Ref range: 21 - 32 mmol/L); Creatinine 0.85 mg/dL (Ref range: 6.31 - 4.97 mg/dL); Glucose 134 mg/dL (H; Ref range: 65 - 026 mg/dL);  HCT 37.8 % (Ref range: 35.0 - 47.0 %); HGB 11.9 g/dL (Ref range: 58.8 - 50.2 g/dL); Potassium 4.3 mmol/L (Ref range: 3.5 - 5.1 mmol/L); Sodium 137 mmol/L (Ref range: 136 - 145 mmol/L)   01/03/2021: BUN 15 mg/dL (Ref range: 6 - 20 mg/dL); Calcium 9.0 mg/dL (Ref range: 8.5 - 77.4 mg/dL); CO2 23 mmol/L (Ref range: 21 - 32 mmol/L); Creatinine 0.76 mg/dL (Ref range: 1.28 - 7.86 mg/dL); Glucose 118 mg/dL (H; Ref range: 65 - 767 mg/dL); HCT  20.9 % (L; Ref range: 35.0 - 47.0 %); HGB 10.8 g/dL (L; Ref range: 47.0 - 96.2 g/dL); Potassium 3.6 mmol/L (Ref range: 3.5 - 5.1 mmol/L); Sodium 139 mmol/L (Ref range: 136 - 145 mmol/L)     Recent Labs            01/07/21   0653  01/06/21   0641     WBC  11.4*  11.4*     HGB  10.0*  10.0*     HCT  30.6*  31.3*         PLT  300  298             Recent Labs            01/07/21   0653  01/06/21   0641     NA  139  139     K  3.8  3.8     CL  108  111*     CO2  25  22     BUN  23*  18     CREA  0.76  0.79     GLU  88  96         CA  9.1  8.8           No results for input(s): ALT, AP, TBIL, TBILI, TP, ALB, GLOB, GGT, AML, LPSE in  the last 72 hours.      No lab exists for component: SGOT, GPT, AMYP, HLPSE   No results for input(s): INR, PTP, APTT, INREXT, INREXT in the last 72 hours.    No results for input(s): FE, TIBC, PSAT, FERR in the last 72 hours.    No results for input(s): PH, PCO2, PO2 in the last 72 hours.   No results for input(s): CPK, CKMB in the last 72 hours.      No lab exists for component: TROPONINI   No results found  for: GLUCPOC         Total Time: >64m in       Signed:   Margie Ege, PA-C   01/08/2021   10:17 AM

## 2021-01-09 MED ORDER — AMOXICILLIN CLAVULANATE 875 MG-125 MG TAB
875-125 mg | Freq: Two times a day (BID) | ORAL | Status: DC
Start: 2021-01-09 — End: 2021-01-10
  Administered 2021-01-09 – 2021-01-10 (×3): via ORAL

## 2021-01-09 MED ORDER — TRAMADOL 50 MG TAB
50 mg | Freq: Four times a day (QID) | ORAL | Status: AC | PRN
Start: 2021-01-09 — End: 2021-01-10

## 2021-01-09 MED FILL — AMOXICILLIN CLAVULANATE 875 MG-125 MG TAB: 875-125 mg | ORAL | Qty: 1

## 2021-01-09 MED FILL — ENOXAPARIN 30 MG/0.3 ML SUB-Q SYRINGE: 30 mg/0.3 mL | SUBCUTANEOUS | Qty: 0.3

## 2021-01-09 MED FILL — SYMBICORT 160 MCG-4.5 MCG/ACTUATION HFA AEROSOL INHALER: RESPIRATORY_TRACT | Qty: 6

## 2021-01-09 MED FILL — PIPERACILLIN-TAZOBACTAM 3.375 GRAM IV SOLR: 3.375 gram | INTRAVENOUS | Qty: 3.38

## 2021-01-09 MED FILL — TYLENOL 325 MG TABLET: 325 mg | ORAL | Qty: 2

## 2021-01-09 MED FILL — ATORVASTATIN 40 MG TAB: 40 mg | ORAL | Qty: 1

## 2021-01-09 MED FILL — METOPROLOL SUCCINATE SR 50 MG 24 HR TAB: 50 mg | ORAL | Qty: 1

## 2021-01-09 MED FILL — SPIRIVA RESPIMAT 2.5 MCG/ACTUATION SOLUTION FOR INHALATION: 2.5 mcg/actuation | RESPIRATORY_TRACT | Qty: 4

## 2021-01-09 MED FILL — PANTOPRAZOLE 40 MG TAB, DELAYED RELEASE: 40 mg | ORAL | Qty: 1

## 2021-01-09 MED FILL — AMITRIPTYLINE 25 MG TAB: 25 mg | ORAL | Qty: 1

## 2021-01-09 NOTE — Progress Notes (Signed)
As per previous note on Dec. 17th:    CM noted discharge order.  Patient is currently pending authorization to go to [facility].  CM has reached out to [facility] to get update on auth.  CM will continue to follow up.    Chart Reviewed on December 18th:  [facility] [place] [facility] stated as follows:    [name]  [facility] - 08:57 AM  Good morning....the [employer] is offering a Warehouse manager is tomorrow at noon [phone] opt#[id number] The Ref# is in the previous msg.    CM called the floor to alert the nurse of the time frame for the P2P. Awaiting a response from the nurse and/or discharging hospitalist that was paged to notify of the P2P.

## 2021-01-09 NOTE — Progress Notes (Signed)
 Problem: Mobility Impaired (Adult and Pediatric)  Goal: *Acute Goals and Plan of Care (Insert Text)  Description: FUNCTIONAL STATUS PRIOR TO ADMISSION: Patient was modified independent using a rolling walker for functional mobility. Per pt report, she had 5 falls in past 3 months.    HOME SUPPORT PRIOR TO ADMISSION: The patient lived alone with neighbor to provide assistance.    Physical Therapy Goals  Initiated 01/06/2021     Patient/family stated goal: I want to go home  1.  Patient will move from supine to sit and sit to supine  in bed with independence within 7 day(s).    2.  Patient will transfer from bed to chair and chair to bed with modified independence using the least restrictive device within 7 day(s).  3.  Patient will perform sit to stand with modified independence within 7 day(s).  4.  Patient will ambulate with modified independence for 150 feet with the least restrictive device within 7 day(s).   5.  Patient will participate in lower extremity therapeutic exercise with independence within 7 day(s).    6.  Patient will verbalize and demonstrate understanding of spinal precautions (No bending, lifting greater than 5 lbs, or twisting; log-roll technique; frequent repositioning as instructed) within 7 days.     Outcome: Progressing Towards Goal   PHYSICAL THERAPY TREATMENT  Patient: [name] (75 y.o. female)  Date: 01/09/2021  Diagnosis: Sepsis (HCC) [A41.9] Sepsis (HCC)      Precautions: Back  Chart, physical therapy assessment, plan of care and goals were reviewed.    ASSESSMENT  Patient continues with skilled PT services and is progressing towards goals. Pt received semi supine in bed upon PTA arrival, agreeable to session. Pt performed bed mobility and came to sit on EOB. She stood to [facility] and amb to toilet. She completed toileting without assistance, but needed one UE support on grab bar while wt shifting to wipe for balance. She stood from toilet and amb to sink where she completed hand washing  needing CGA due to slight unsteadiness noted with posterior lean. She amb in room and back to bed. She sat on EOB and completed LE occasionally needing BUE support for balance. Pt went back into supine and scooted to [facility]. She was left in NAD, call bell within reach, all needs addressed. (See below for objective details and assist levels). Overall pt tolerated session fairly well today with good participation and increased amb distance. Will continue to benefit from skilled PT services, and will continue to progress as tolerated.     Current Level of Function Impacting Discharge (mobility/balance): assist x 1, balance deficits    Other factors to consider for discharge: severity of impairments, safety, home environment and support available         PLAN :  Patient continues to benefit from skilled intervention to address the above impairments.  Continue treatment per established plan of care to address goals.    Recommend with staff: Encourage HEP in prep for ADLs/mobility, Amb to bathroom for toileting with gt belt and AD, and Use of bed/chair alarm for safety    Recommendation for discharge: (in order for the patient to meet his/her long term goals)  Skilled Nursing Facility    This discharge recommendation:  Has been made in collaboration with the attending provider and/or case management    IF patient discharges home will need the following DME: rolling walker and to be determined (TBD)       SUBJECTIVE:   Patient  stated "I would love to walk to use the bathroom."    OBJECTIVE DATA SUMMARY:   Critical Behavior:  Neurologic State: Alert  Orientation Level: Oriented X4  Cognition: Follows commands     Functional Mobility Training:  Bed Mobility:  Rolling: Stand-by assistance  Supine to Sit: Stand-by assistance;Additional time  Sit to Supine: Stand-by assistance;Additional time  Scooting: Stand-by assistance;Additional time     Transfers:  Sit to Stand: Contact guard assistance  Stand to Sit: Contact guard  assistance     Balance:  Sitting: Impaired  Sitting - Static: Fair (occasional)  Sitting - Dynamic: Fair (occasional)  Standing: Impaired  Standing - Static: Fair;Constant support  Standing - Dynamic : Fair;Constant support    Ambulation/Gait Training:  Distance (ft): 35 Feet (ft)  Assistive Device: Walker, rolling;Gait belt  Ambulation - Level of Assistance: Minimal assistance;Contact guard assistance  Gait Abnormalities: Shuffling gait;Decreased step clearance  Speed/Cadence: Slow;Shuffled    Therapeutic Exercises:       EXERCISE   Sets   Reps   Active Active Assist   Passive Self ROM   Comments   Ankle Pumps 1 10 [x]  []  []  []     Glut Sets 1 5 [x]  []  []  []     Hip abd/add 1 10 [x]  []  []  []     Long Arc Quads 1 10 [x]  []  []  []     Marching 1 10 [x]  []  []  []        Pain Rating:  210 headache    Activity Tolerance:   Fair    After treatment patient left in no apparent distress:   Bed locked and returned to lowest position, Supine in bed, Call bell within reach, Bed / chair alarm activated, and Side rails x 3    COMMUNICATION/COLLABORATION:   The patient's plan of care was discussed with: Registered nurse.     Lonn Maduro, PTA, PT   Time Calculation: 26 mins

## 2021-01-09 NOTE — Discharge Summary (Signed)
Discharge Summary by Santiago Bumpers, NP at 01/09/21 0831                Author: Santiago Bumpers, NP  Service: Internal Medicine  Author Type: Nurse Practitioner       Filed: 01/09/21 1326  Date of Service: 01/09/21 0831  Status: Signed           Editor: Santiago Bumpers, NP (Nurse Practitioner)  Cosigner: Arnette Felts, MD at 01/09/21 1807                                              Hospitalist Discharge Summary        Patient ID:     [name]   [id number]   75 y.o.   [birth year]      Admit date: 01/02/2021      Discharge date : 01/09/2021      Final Diagnoses:    1.  Sepsis syndrome etiology unclear   2.  Gait disturbance with recurrent fall/injury with spinal cord compression, multiple vertebral compression fractures, thoracic myelopathy   3.  Hypertension   4.  COPD without exacerbation   5.  Hyperlipidemia      Reason for Hospitalization: [facility] Course: Patient12 y.o. female  with history of COPD stable on treatment maintenance inhalers, hyperlipidemia, history of recurrent falls with generalized weakness suspected of cardiac syncope presents to the emergency room  complaining of recurrent falls.  Last 3 days she was falling and injuring herself and she does not know how she is falling.  Admits dizziness  with ambulation.  Denies any chest pain or palpitations.  Denies any fever or chills.  Admits appetite is decreased and not eating well.        Evaluation in the emergency room significant leukocytosis with left shift, negative evidence of infection, chest x-ray did not show any pneumonia.   Urine analysis is clear, skin with no wounds.  Negative for COVID-19, influenza.  She seems to have infection but source is unclear at this time. ID consulted.  Inflammatory markers elevated.  Started  on ceftaroline and then transition to IV Zosyn.  CT of the chest showed mild right atelectasis/consolidation with trace right sided fusion with chronic T5, T7, T10 vertebral compression  fractures, moderate coronary vascular calcifications nuclear medicine  bone scan unremarkable. Transition to oral Augmentin per ID for 10 more days       Neurology consulted.  CT of the head and cervical spine unremarkable.  Per the neurology note no further work-up warranted.  Recommend PT and OT evaluation.  Patient most likely will need placement  to rehab.  Orthostatics positive.  Given a fluid bolus.  MRI of the cervical and thoracic spine showed no acute fracture or subluxation of the cervical spine with multilevel generative changes, disc disease, listlessness contributing to multilevel  mild to moderate spinal canal stenosis and varying degrees of neural foraminal stenosis, subacute appearing severe T10 compression fracture with retropulsion, moderate to severe spinal cord compression with findings of subtle compression myopathy at this  level, associated T9-T11 mild to moderate spinal canal and neuroforaminal stenosis, T6-T7 central disc extrusion with mild spinal stenosis, chronic T7 compression deformity.  Per orthopedic spinal surgeon will defer surgery at this time and recommend  physical therapy follow-up with them in office in 3  to 4 weeks we will start tramadol for chronic radiculopathy and headache.      Discharge delayed due to insurance auth and SNF placement          Discharge Medications:      Current Discharge Medication List                 START taking these medications          Details        amitriptyline (ELAVIL) 75 mg tablet  Take 1 Tablet by mouth nightly for 30 days.   Qty: 30 Tablet, Refills: 0   Start date: 01/07/2021, End date: 02/06/2021               amoxicillin-clavulanate (Augmentin) 875-125 mg per tablet  Take 1 Tablet by mouth two (2) times a day for 10 days.   Qty: 20 Tablet, Refills: 0   Start date: 01/07/2021, End date: 01/17/2021                        CONTINUE these medications which have NOT CHANGED          Details        acetaminophen (TYLENOL) 325 mg tablet  Take 325 mg  by mouth every four (4) hours as needed for Pain (Take 2 tablets by mouth as needed for pain).               atorvastatin (LIPITOR) 40 mg tablet  Take 40 mg by mouth daily.               metoprolol succinate (TOPROL-XL) 25 mg XL tablet  Take 25 mg by mouth daily.               fluticasone-umeclidin-vilanter (TRELEGY ELLIPTA) 200-62.5-25 mcg inhaler  Trelegy Ellipta 200 mcg-62.5 mcg-25 mcg powder for inhalation    INHALE 1 PUFF BY MOUTH ONCE A DAY               albuterol (PROVENTIL HFA, VENTOLIN HFA, PROAIR HFA) 90 mcg/actuation inhaler  albuterol sulfate HFA 90 mcg/actuation aerosol inhaler    INHALE 2 PUFFS EVERY 6 HOURS AS NEEDED               omeprazole (PRILOSEC) 20 mg capsule  Take 20 mg by mouth daily.                        STOP taking these medications                  etodolac (LODINE) 400 mg tablet  Comments:    Reason for Stopping:                      rosuvastatin (CRESTOR) 10 mg tablet  Comments:    Reason for Stopping:                                Follow up Care:     1. Cioflec, Doran Durand, MD in 1-2 weeks.          Follow-up Information                  Follow up With  Specialties  Details  Why  Contact Info              Alois Cliche, MD  Orthopedic Surgery  Follow  up in 3 week(s)    [address]   [place] [address]   [phone]                 Cioflec, Doran Durand, MD  Internal Medicine Physician  Follow up in 2 week(s)    [address]   [address]   [place] [address]   [phone]                           Patient Follow Up Instructions:    Activity: Activity as tolerated   Diet:  Cardiac Diet   Wound care: None Needed      Condition at Discharge:  Stable   __________________________________________________________________      Disposition   SNF/LTC   ____________________________________________________________________      Code Status: Full Code   ___________________________________________________________________      Discharge Exam:   Patient seen and examined by me on  discharge day.   Pertinent Findings:   Gen:    Not in distress   Chest: Clear lungs   CVS:   Regular rhythm.  No edema   Abd:  Soft, not distended, not tender   Neuro:  Alert      CONSULTATIONS: ID, Neurology, and Orthopedic Surgery      Significant Diagnostic Studies:    01/02/2021: BUN 22 mg/dL (H; Ref range: 6 - 20 mg/dL); Calcium 9.1 mg/dL (Ref range: 8.5 - 06.2 mg/dL); CO2 20 mmol/L (L; Ref range: 21 - 32 mmol/L); Creatinine 0.85 mg/dL (Ref range: 6.94 - 8.54 mg/dL); Glucose 134 mg/dL (H; Ref range: 65 - 627 mg/dL);  HCT 03.5 % (Ref range: 35.0 - 47.0 %); HGB 11.9 g/dL (Ref range: 00.9 - 38.1 g/dL); Potassium 4.3 mmol/L (Ref range: 3.5 - 5.1 mmol/L); Sodium 137 mmol/L (Ref range: 136 - 145 mmol/L)   01/03/2021: BUN 15 mg/dL (Ref range: 6 - 20 mg/dL); Calcium 9.0 mg/dL (Ref range: 8.5 - 82.9 mg/dL); CO2 23 mmol/L (Ref range: 21 - 32 mmol/L); Creatinine 0.76 mg/dL (Ref range: 9.37 - 1.69 mg/dL); Glucose 118 mg/dL (H; Ref range: 65 - 678 mg/dL); HCT  93.8 % (L; Ref range: 35.0 - 47.0 %); HGB 10.8 g/dL (L; Ref range: 10.1 - 75.1 g/dL); Potassium 3.6 mmol/L (Ref range: 3.5 - 5.1 mmol/L); Sodium 139 mmol/L (Ref range: 136 - 145 mmol/L)     Recent Labs           01/07/21   0653     WBC  11.4*     HGB  10.0*     HCT  30.6*        PLT  300             Recent Labs           01/07/21   0653     NA  139     K  3.8     CL  108     CO2  25     BUN  23*     CREA  0.76     GLU  88        CA  9.1           No results for input(s): ALT, AP, TBIL, TBILI, TP, ALB, GLOB, GGT, AML, LPSE in the last 72 hours.      No lab exists for component: SGOT, GPT, AMYP, HLPSE   No results for input(s): INR, PTP,  APTT, INREXT, INREXT in the last 72 hours.    No results for input(s): FE, TIBC, PSAT, FERR in the last 72 hours.    No results for input(s): PH, PCO2, PO2 in the last 72 hours.   No results for input(s): CPK, CKMB in the last 72 hours.      No lab exists for component: TROPONINI   No results found for: GLUCPOC         Total Time: >5m  in       Signed:   Santiago Bumpers, NP   01/09/2021   10:17 AM

## 2021-01-10 LAB — METABOLIC PANEL, BASIC
Anion gap: 10 mmol/L (ref 5–15)
BUN/Creatinine ratio: 24 — ABNORMAL HIGH (ref 12–20)
BUN: 22 mg/dL — ABNORMAL HIGH (ref 6–20)
CO2: 24 mmol/L (ref 21–32)
Calcium: 9.3 mg/dL (ref 8.5–10.1)
Chloride: 105 mmol/L (ref 97–108)
Creatinine: 0.92 mg/dL (ref 0.55–1.02)
Glucose: 96 mg/dL (ref 65–100)
Potassium: 3.7 mmol/L (ref 3.5–5.1)
Sodium: 139 mmol/L (ref 136–145)
eGFR: >60 mL/min/{1.73_m2} (ref >60)

## 2021-01-10 LAB — CBC WITH AUTOMATED DIFF
ABS. BASOPHILS: 0 10*3/uL (ref 0.0–0.1)
ABS. EOSINOPHILS: 0.2 10*3/uL (ref 0.0–0.4)
ABS. IMM. GRANS.: 0.1 10*3/uL — ABNORMAL HIGH (ref 0.00–0.04)
ABS. LYMPHOCYTES: 2.6 10*3/uL (ref 0.8–3.5)
ABS. MONOCYTES: 0.8 10*3/uL (ref 0.0–1.0)
ABS. NEUTROPHILS: 4.2 10*3/uL (ref 1.8–8.0)
ABSOLUTE NRBC: 0 10*3/uL (ref 0.00–0.01)
BASOPHILS: 1 % (ref 0–1)
EOSINOPHILS: 2 % (ref 0–7)
HCT: 39.5 % (ref 35.0–47.0)
HGB: 12.5 g/dL (ref 11.5–16.0)
IMMATURE GRANULOCYTES: 1 % — ABNORMAL HIGH (ref 0–0.5)
LYMPHOCYTES: 33 % (ref 12–49)
MCH: 31.4 PG (ref 26.0–34.0)
MCHC: 31.6 g/dL (ref 30.0–36.5)
MCV: 99.2 FL — ABNORMAL HIGH (ref 80.0–99.0)
MONOCYTES: 10 % (ref 5–13)
MPV: 8.8 FL — ABNORMAL LOW (ref 8.9–12.9)
NEUTROPHILS: 53 % (ref 32–75)
NRBC: 0 PER 100 WBC
PLATELET: 336 10*3/uL (ref 150–400)
RBC: 3.98 M/uL (ref 3.80–5.20)
RDW: 13.3 % (ref 11.5–14.5)
WBC: 7.9 10*3/uL (ref 3.6–11.0)

## 2021-01-10 LAB — CBC WITH AUTO DIFFERENTIAL
Basophils %: 1 % (ref 0–1)
Basophils Absolute: 0 10*3/uL (ref 0.0–0.1)
Eosinophils %: 2 % (ref 0–7)
Eosinophils Absolute: 0.2 10*3/uL (ref 0.0–0.4)
Granulocyte Absolute Count: 0.1 10*3/uL — ABNORMAL HIGH (ref 0.00–0.04)
Hematocrit: 39.5 % (ref 35.0–47.0)
Hemoglobin: 12.5 g/dL (ref 11.5–16.0)
Immature Granulocytes: 1 % — ABNORMAL HIGH (ref 0–0.5)
Lymphocytes %: 33 % (ref 12–49)
Lymphocytes Absolute: 2.6 10*3/uL (ref 0.8–3.5)
MCH: 31.4 PG (ref 26.0–34.0)
MCHC: 31.6 g/dL (ref 30.0–36.5)
MCV: 99.2 FL — ABNORMAL HIGH (ref 80.0–99.0)
MPV: 8.8 FL — ABNORMAL LOW (ref 8.9–12.9)
Monocytes %: 10 % (ref 5–13)
Monocytes Absolute: 0.8 10*3/uL (ref 0.0–1.0)
NRBC Absolute: 0 10*3/uL (ref 0.00–0.01)
Neutrophils %: 53 % (ref 32–75)
Neutrophils Absolute: 4.2 10*3/uL (ref 1.8–8.0)
Nucleated RBCs: 0 PER 100 WBC
Platelets: 336 10*3/uL (ref 150–400)
RBC: 3.98 M/uL (ref 3.80–5.20)
RDW: 13.3 % (ref 11.5–14.5)
WBC: 7.9 10*3/uL (ref 3.6–11.0)

## 2021-01-10 LAB — BASIC METABOLIC PANEL
Anion Gap: 10 mmol/L (ref 5–15)
BUN: 22 mg/dL — ABNORMAL HIGH (ref 6–20)
Bun/Cre Ratio: 24 — ABNORMAL HIGH (ref 12–20)
CO2: 24 mmol/L (ref 21–32)
Calcium: 9.3 mg/dL (ref 8.5–10.1)
Chloride: 105 mmol/L (ref 97–108)
Creatinine: 0.92 mg/dL (ref 0.55–1.02)
ESTIMATED GLOMERULAR FILTRATION RATE: >60 mL/min/{1.73_m2} (ref >60)
Glucose: 96 mg/dL (ref 65–100)
Potassium: 3.7 mmol/L (ref 3.5–5.1)
Sodium: 139 mmol/L (ref 136–145)

## 2021-01-10 MED FILL — PANTOPRAZOLE 40 MG TAB, DELAYED RELEASE: 40 mg | ORAL | Qty: 1

## 2021-01-10 MED FILL — AMITRIPTYLINE 25 MG TAB: 25 mg | ORAL | Qty: 1

## 2021-01-10 MED FILL — AMOXICILLIN CLAVULANATE 875 MG-125 MG TAB: 875-125 mg | ORAL | Qty: 1

## 2021-01-10 MED FILL — ENOXAPARIN 30 MG/0.3 ML SUB-Q SYRINGE: 30 mg/0.3 mL | SUBCUTANEOUS | Qty: 0.3

## 2021-01-10 MED FILL — TYLENOL 325 MG TABLET: 325 mg | ORAL | Qty: 2

## 2021-01-10 MED FILL — ATORVASTATIN 40 MG TAB: 40 mg | ORAL | Qty: 1

## 2021-01-10 MED FILL — SPIRIVA RESPIMAT 2.5 MCG/ACTUATION SOLUTION FOR INHALATION: 2.5 mcg/actuation | RESPIRATORY_TRACT | Qty: 4

## 2021-01-10 NOTE — Progress Notes (Signed)
Physician Progress Note      PATIENT:               [name], [name]  CSN #:                  [id number]  DOB:                       [birth year]  ADMIT DATE:       01/02/2021 6:18 PM  DISCH DATE:        01/10/2021 2:57 PM  RESPONDING  PROVIDER #:        Rebekah Chesterfield NP          QUERY TEXT:    Patient admitted with Sepsis. Noted documentation of Pneumonia in 12/14 Infectious disease note. In order to support the diagnosis of Pneumonia, please include additional clinical indicators in your documentation.  Or please document if the diagnosis of Pneumonia has been ruled out after further study.    The medical record reflects the following:  Risk Factors: ***  Clinical Indicators: 12/14 Infectious disease note -  MRI ordered to rule out cord compression but showed abnormalities suggestive of pneumonia. Dedicated CT Chest showed RLL consolidation.  Possible RLL pneumonia  12/13 MRI - Scattered signal abnormalities in the bilateral lungs and a small right  pleural effusion may reflect focal pneumonia.  12/13 CT Chest - Minimal right basilar atelectasis/consolidation with trace right-sided effusion.  12/14 Ceretec Scan - There is physiologic distribution of labeled WBCs.  There is no apparent abscess/inflammation.  12/15 Infectious disease - Sepsis parameters improving with source presumed to be RLL pneumonia, despite absence or paucity of respiratory symptoms and negative Ceretec scan.  Treatment: IV Zosyn, Discharged home on PO Augementin 875mg  x 5 days      Please email [email or web address] with any questions  Options provided:  -- Pneumonia present as evidenced by, Please document evidence.  -- Pneumonia was ruled out  -- Other - I will add my own diagnosis  -- Disagree - Not applicable / Not valid  -- Disagree - Clinically unable to determine / Unknown  -- Refer to Clinical Documentation Reviewer    PROVIDER RESPONSE TEXT:    Pneumonia is present as evidenced by Leukocytosis, chest x-ray with atelectasis  consolidation and right-sided pleural effusion, elevated inflammatory markers    Query created by: on 01/12/2021 6:11 AM      Electronically signed by:  01/14/2021 NP 01/12/2021 10:23 AM

## 2021-01-10 NOTE — Progress Notes (Signed)
Report called to [facility] and spoke with Inetta Fermo, Rn. SBAR given in report along with recent vitals and lab values. Pt is being taken to the facility by her friend per POV. Pt was wheeled down in a wheelchair and left in a private vehicle.

## 2021-01-10 NOTE — Progress Notes (Signed)
shift change report given to Popp (oncoming nurse) by Yudong (offgoing nurse).

## 2021-01-10 NOTE — Progress Notes (Signed)
Problem: Falls - Risk of  Goal: *Absence of Falls  Description: Document [name] Fall Risk and appropriate interventions in the flowsheet.  Outcome: Progressing Towards Goal  Note: Fall Risk Interventions:  Mobility Interventions: Bed/chair exit alarm, PT Consult for mobility concerns    Mentation Interventions: Bed/chair exit alarm, Adequate sleep, hydration, pain control, Room close to nurse's station, Update white board    Medication Interventions: Bed/chair exit alarm    Elimination Interventions: Bed/chair exit alarm, Call light in reach    History of Falls Interventions: Bed/chair exit alarm         Problem: Patient Education: Go to Patient Education Activity  Goal: Patient/Family Education  Outcome: Progressing Towards Goal

## 2021-01-10 NOTE — Progress Notes (Signed)
[facility] sent a message via [employer] yesterday indicating insurance is offering a peer to peer. Deadline for peer to peer is noon today. Phone# [phone] option#5. Reference# [id number]. Peer to peer information was provided to the attending this morning. Awaiting decision from insurance.

## 2021-01-10 NOTE — Discharge Summary (Signed)
Discharge Summary by Santiago Bumpers, NP at 01/10/21 1331                Author: Santiago Bumpers, NP  Service: Internal Medicine  Author Type: Nurse Practitioner       Filed: 01/10/21 1332  Date of Service: 01/10/21 1331  Status: Signed           Editor: Santiago Bumpers, NP (Nurse Practitioner)  Cosigner: Arnette Felts, MD at 01/11/21 1508                                              Hospitalist Discharge Summary        Patient ID:     [name]   [id number]   75 y.o.   1945-05-01      Admit date: 01/02/2021      Discharge date : 01/10/2021      Final Diagnoses:    1.  Sepsis syndrome etiology unclear   2.  Gait disturbance with recurrent fall/injury with spinal cord compression, multiple vertebral compression fractures, thoracic myelopathy   3.  Hypertension   4.  COPD without exacerbation   5.  Hyperlipidemia      Reason for Hospitalization: [facility] Course: [name]  with history of COPD stable on treatment maintenance inhalers, hyperlipidemia, history of recurrent falls with generalized weakness suspected of cardiac syncope presents to the emergency room  complaining of recurrent falls.  Last 3 days she was falling and injuring herself and she does not know how she is falling.  Admits dizziness  with ambulation.  Denies any chest pain or palpitations.  Denies any fever or chills.  Admits appetite is decreased and not eating well.        Evaluation in the emergency room significant leukocytosis with left shift, negative evidence of infection, chest x-ray did not show any pneumonia.   Urine analysis is clear, skin with no wounds.  Negative for COVID-19, influenza.  She seems to have infection but source is unclear at this time. ID consulted.  Inflammatory markers elevated.  Started  on ceftaroline and then transition to IV Zosyn.  CT of the chest showed mild right atelectasis/consolidation with trace right sided fusion with chronic T5, T7, T10 vertebral compression  fractures, moderate coronary vascular calcifications nuclear medicine  bone scan unremarkable. Transition to oral Augmentin per ID for 10 more days       Neurology consulted.  CT of the head and cervical spine unremarkable.  Per the neurology note no further work-up warranted.  Recommend PT and OT evaluation.  Patient most likely will need placement  to rehab.  Orthostatics positive.  Given a fluid bolus.  MRI of the cervical and thoracic spine showed no acute fracture or subluxation of the cervical spine with multilevel generative changes, disc disease, listlessness contributing to multilevel  mild to moderate spinal canal stenosis and varying degrees of neural foraminal stenosis, subacute appearing severe T10 compression fracture with retropulsion, moderate to severe spinal cord compression with findings of subtle compression myopathy at this  level, associated T9-T11 mild to moderate spinal canal and neuroforaminal stenosis, T6-T7 central disc extrusion with mild spinal stenosis, chronic T7 compression deformity.  Per orthopedic spinal surgeon will defer surgery at this time and recommend  physical therapy follow-up with them in office in 3  to 4 weeks.           Discharge Medications:      Current Discharge Medication List                 START taking these medications          Details        amitriptyline (ELAVIL) 75 mg tablet  Take 1 Tablet by mouth nightly for 30 days.   Qty: 30 Tablet, Refills: 0   Start date: 01/07/2021, End date: 02/06/2021               amoxicillin-clavulanate (Augmentin) 875-125 mg per tablet  Take 1 Tablet by mouth two (2) times a day for 10 days.   Qty: 20 Tablet, Refills: 0   Start date: 01/07/2021, End date: 01/17/2021                        CONTINUE these medications which have NOT CHANGED          Details        acetaminophen (TYLENOL) 325 mg tablet  Take 325 mg by mouth every four (4) hours as needed for Pain (Take 2 tablets by mouth as needed for pain).               atorvastatin  (LIPITOR) 40 mg tablet  Take 40 mg by mouth daily.               metoprolol succinate (TOPROL-XL) 25 mg XL tablet  Take 25 mg by mouth daily.               fluticasone-umeclidin-vilanter (TRELEGY ELLIPTA) 200-62.5-25 mcg inhaler  Trelegy Ellipta 200 mcg-62.5 mcg-25 mcg powder for inhalation    INHALE 1 PUFF BY MOUTH ONCE A DAY               albuterol (PROVENTIL HFA, VENTOLIN HFA, PROAIR HFA) 90 mcg/actuation inhaler  albuterol sulfate HFA 90 mcg/actuation aerosol inhaler    INHALE 2 PUFFS EVERY 6 HOURS AS NEEDED               omeprazole (PRILOSEC) 20 mg capsule  Take 20 mg by mouth daily.                        STOP taking these medications                  etodolac (LODINE) 400 mg tablet  Comments:    Reason for Stopping:                      rosuvastatin (CRESTOR) 10 mg tablet  Comments:    Reason for Stopping:                                Follow up Care:     1. Cioflec, Doran Durand, MD in 1-2 weeks.          Follow-up Information                  Follow up With  Specialties  Details  Why  Contact Info              Alois Cliche, MD  Orthopedic Surgery  Follow up in 3 week(s)    [address]   [place] [address]   [phone]  Cioflec, Doran Durand, MD  Internal Medicine Physician  Follow up in 2 week(s)    [address]   [address]   [place] [address]   [phone]                           Patient Follow Up Instructions:    Activity: Activity as tolerated   Diet:  Cardiac Diet   Wound care: None Needed      Condition at Discharge:  Stable   __________________________________________________________________      Disposition   SNF/LTC   ____________________________________________________________________      Code Status: Full Code   ___________________________________________________________________      Discharge Exam:   Patient seen and examined by me on discharge day.   Pertinent Findings:   Gen:    Not in distress   Chest: Clear lungs   CVS:   Regular rhythm.  No edema    Abd:  Soft, not distended, not tender   Neuro:  Alert      CONSULTATIONS: ID, Neurology, and Orthopedic Surgery      Significant Diagnostic Studies:    01/02/2021: BUN 22 mg/dL (H; Ref range: 6 - 20 mg/dL); Calcium 9.1 mg/dL (Ref range: 8.5 - 24.0 mg/dL); CO2 20 mmol/L (L; Ref range: 21 - 32 mmol/L); Creatinine 0.85 mg/dL (Ref range: 9.73 - 5.32 mg/dL); Glucose 134 mg/dL (H; Ref range: 65 - 992 mg/dL);  HCT 42.6 % (Ref range: 35.0 - 47.0 %); HGB 11.9 g/dL (Ref range: 83.4 - 19.6 g/dL); Potassium 4.3 mmol/L (Ref range: 3.5 - 5.1 mmol/L); Sodium 137 mmol/L (Ref range: 136 - 145 mmol/L)   01/03/2021: BUN 15 mg/dL (Ref range: 6 - 20 mg/dL); Calcium 9.0 mg/dL (Ref range: 8.5 - 22.2 mg/dL); CO2 23 mmol/L (Ref range: 21 - 32 mmol/L); Creatinine 0.76 mg/dL (Ref range: 9.79 - 8.92 mg/dL); Glucose 118 mg/dL (H; Ref range: 65 - 119 mg/dL); HCT  41.7 % (L; Ref range: 35.0 - 47.0 %); HGB 10.8 g/dL (L; Ref range: 40.8 - 14.4 g/dL); Potassium 3.6 mmol/L (Ref range: 3.5 - 5.1 mmol/L); Sodium 139 mmol/L (Ref range: 136 - 145 mmol/L)     Recent Labs           01/10/21   1122        WBC  7.9     HGB  12.5     HCT  39.5        PLT  336             Recent Labs           01/10/21   1122     NA  139     K  3.7     CL  105     CO2  24     BUN  22*     CREA  0.92     GLU  96        CA  9.3           No results for input(s): ALT, AP, TBIL, TBILI, TP, ALB, GLOB, GGT, AML, LPSE in the last 72 hours.      No lab exists for component: SGOT, GPT, AMYP, HLPSE   No results for input(s): INR, PTP, APTT, INREXT, INREXT in the last 72 hours.    No results for input(s): FE, TIBC, PSAT, FERR in the last 72 hours.    No results for input(s): PH,  PCO2, PO2 in the last 72 hours.   No results for input(s): CPK, CKMB in the last 72 hours.      No lab exists for component: TROPONINI   No results found for: GLUCPOC         Total Time: >4m in       Signed:   Santiago Bumpers, NP   01/10/2021   10:17 AM

## 2021-01-10 NOTE — Progress Notes (Signed)
DC Plan: [facility]       Room# 213B       Report: [phone]       Transportation: friend    Peer to peer was approved.     Cm received room# and report to call from [facility].     Cm met with pt at the bedside and informed her of discharge to [facility] today. Transportation was discussed. Pt is going to call her friend to see if her friend can pick her up.     Cm spoke with pt's nurse. Pt's friend is on the way to pick up pt.     Discharge plan of care/case management plan validated with provider's discharge order.

## 2021-01-10 NOTE — Progress Notes (Signed)
 OCCUPATIONAL THERAPY TREATMENT  Patient: [name] (75 y.o. female)  Date: 01/10/2021  Diagnosis: Sepsis (HCC) [A41.9] Sepsis (HCC)      Precautions: Back  Chart, occupational therapy assessment, plan of care, and goals were reviewed.    ASSESSMENT  Pt continues with skilled OT services and is progressing towards goals. Upon COTA arrival, pt semi supine in bed and agreeable to tx session at this time.  Overall, pt continues to present with deficits in generalized strength/AROM, coordination, bed mobility, static/dynamic sitting and standing balance and functional activity tolerance during performance of ADLs/mobility (see below for objective details and assist levels). Pt noted with limited improvement in mobility this date.  Pt completed transfer to EOB with SBA.  Pt completed sit>stand with CGA/MinA overall.  Pt ambulated to bathroom with CGA/MinA and transferred to toilet with MinA.  Pt reporting 9/10 headache and full body numbness, RN notified.  Toileting completed with MaxA overall for thoroughness and due to decreased standing balance.  Pt returned to EOB with MinA, donned underwear with setup/CGA, and then returned to supine with SBA.  Pt BP in supine noted to be 108/69, HR of 91, and SpO2 of 95.  Pt left semi supine in bed with RN in room.  Recommend d/c to Skilled Nursing Facility once medically appropriate.    Other factors to consider for discharge: family/social support, DME, time since onset, severity of deficits, decline from functional baseline         PLAN :  Patient continues to benefit from skilled intervention to address the above impairments.  Continue treatment per established plan of care to address goals.    Recommendation for discharge: (in order for the patient to meet his/her long term goals)  Skilled Nursing Facility    This discharge recommendation:  Has been made in collaboration with the attending provider and/or case management    IF patient discharges home will need the  following DME: TBD       SUBJECTIVE:   Patient stated "I have a headache."    OBJECTIVE DATA SUMMARY:   Cognitive/Behavioral Status:  Neurologic State: Alert  Orientation Level: Appropriate for age;Oriented X4  Cognition: Follows commands    Functional Mobility and Transfers for ADLs:  Bed Mobility:  Rolling: Stand-by assistance  Supine to Sit: Stand-by assistance  Sit to Supine: Stand-by assistance  Scooting: Stand-by assistance    Transfers:  Sit to Stand: Contact guard assistance;Minimum assistance  Functional Transfers  Bathroom Mobility: Minimum assistance  Toilet Transfer : Minimum assistance    Balance:  Sitting: Impaired;With support  Sitting - Static: Good (unsupported)  Sitting - Dynamic: Fair (occasional)  Standing: Impaired;With support  Standing - Static: Constant support;Fair  Standing - Dynamic : Constant support;Fair    ADL Intervention:  Lower Body Dressing Assistance  Underpants: Set-up;Contact guard assistance    Toileting  Bowel Hygiene: Maximum assistance    Pain:  9/10 headache    Activity Tolerance:   Fair and requires rest breaks  Please refer to the flowsheet for vital signs taken during this treatment.    After treatment patient left in no apparent distress:   Bed locked and in lowest position  Supine in bed, Call bell within reach, Bed / chair alarm activated, and Side rails x 3    COMMUNICATION/COLLABORATION:   The patient's plan of care was discussed with: Registered nurse and Certified nursing assistant/patient care technician.     Hailey Bernshausen, COTA  Time Calculation: 25 mins    Problem: Self  Care Deficits Care Plan (Adult)  Goal: *Acute Goals and Plan of Care (Insert Text)  Description: FUNCTIONAL STATUS PRIOR TO ADMISSION: Pt reports she was IND w/ functional mobility/ADLs prior to admission. She reports at least 5 falls within the last 3 months.     HOME SUPPORT: Pt lives by herself and neighbors check in on her some.    Occupational Therapy Goals  Initiated 01/06/2021    Pt  stated goal I want to get better.  Pt will be IND sup <> sit in prep for EOB ADLs  Pt will be mod I grooming standing sink side LRAD  Pt will be IND UB dressing sitting EOB/long sit   Pt will be IND LE dressing sitting EOB/long sit  Pt will be Mod I sit <> stand in prep for toileting LRAD  Pt will be Mod I toileting/toilet transfer/cloth mgmt LRAD  Pt will be IND following UE HEP in prep for self care tasks   Outcome: Progressing Towards Goal

## 2021-01-10 NOTE — Progress Notes (Signed)
Pt has discharge orders for today. Pt is stable, mentation is at baseline and doesn't show any signs of distress. Pt is discharging without an IV, tele or foley.      Discharge plan of care/case management plan validated with provider discharge order.

## 2021-01-10 NOTE — Progress Notes (Signed)
Progress Notes by Lorina Rabon, MD at 01/10/21 1457                Author: Lorina Rabon, MD  Service: --  Author Type: Physician       Filed: 01/11/21 0044  Date of Service: 01/10/21 1457  Status: Signed          Editor: Lorina Rabon, MD (Physician)                             Progress Note          Patient: [name]  MRN: [id number]   SSN: [id number]          Date of Birth: 26-Jan-1945   Age: 75 y.o.   Sex: female         Admit Date: 01/02/2021     LOS: 8 days         Subjective:     Patient with concern for sepsis, however, no obvious source of infection. Abnormal CT Chest suggests pneumonia though no cough or SOB.   She remains afebrile with decreasing WBC, CRP and procal.  Ceretec scan negative.  Patient resting comfortably with no new complaints.         Objective:          Vitals:             01/09/21 2131  01/10/21 0323  01/10/21 0826  01/10/21 1115           BP:    95/66  97/67  108/69     Pulse:    85  97  91     Resp:    18  16       Temp:    97.8 ??F (36.6 ??C)  97 ??F (36.1 ??C)       SpO2:  92%  94%  95%  95%     Weight:                   Height:                    Intake and Output:   Current Shift: No intake/output data recorded.   Last three shifts: 12/18 0701 - 12/19 1900   In: -    Out: 102 [Urine:100]      Physical Exam:    Vitals and nursing note reviewed.    Patient unavailable for re-examination   Constitutional:        Appearance: She is not ill-appearing.    HENT:       Head: Normocephalic and atraumatic.         Comments: Flesh colored swelling left forehead, nontender, no erythema, no ecchymoses      Right Ear: External ear normal.       Left Ear: External ear normal.       Mouth/Throat:       Mouth: Mucous membranes are moist.    Eyes:       Pupils: Pupils are equal, round, and reactive to light.    Cardiovascular:       Rate and Rhythm: Normal rate and regular rhythm.    Pulmonary:       Effort: Pulmonary effort is normal.       Breath sounds: Normal breath sounds.     Abdominal:       General: Bowel sounds are normal. There is no distension.  Palpations: Abdomen is soft.       Tenderness: There is no abdominal tenderness.    Musculoskeletal:       Cervical back: Neck supple.       Right lower leg: No edema.       Left lower leg: No edema.    Skin:      Findings: No lesion or rash.    Neurological:       General: No focal deficit present.       Mental Status: She is alert and oriented to person, place, and time.    Psychiatric:          Behavior: Behavior normal.       Lab/Data Review:       WBC 7,900      CRP 2.78 <4.62 <8.14 <16.50    Procal <0.05 <0.07 <0.17 <0.23 <0.35      Blood cultures (12/11) No growth FINAL   Urine culture (12/11) No growth FINAL      MRI Thoracic spine w/o contrast (12/13)   1.  Subacute-appearing severe T10 compression fracture with osseous   retropulsion. 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 Associated T9-T11 Mild-to-moderate spinal   canal and neuroforaminal stenoses.   2.  T6-T7 Central disc extrusion with mild spinal stenosis at this level.   3.  Chronic T7 compression deformity.   4.  Scattered signal abnormalities in the bilateral lungs and a small right   pleural effusion may reflect focal pneumonia. Dedicated chest CT recommended for   further characterization.              CT Chest (12/13)  Minimal right basilar atelectasis/consolidation with trace right-sided effusion.   Chronic T5, T7 and T10 vertebral compression fractures. Moderate coronary vascular calcifications.           Ceretec scan (12/13) Unremarkable Whole Body WBC Scan.              Assessment:        Principal Problem:     Sepsis (HCC) (01/02/2021)   1.  Presumptive sepsis with leukocytosis, elevated CRP and procal,  etiology  unclear, resolving Day #9 IV antibiotics, now Zosyn   2.  Possible RLL pneumonia, Day #9 IV Zosyn   3.  Soft tissue swelling left forehead, no signs of infection   4.  Frequent falls  with dizziness   5.  [place] and hyperreflexia (per Neurology), ruling out cord compression   6.  Possible pneumonia (per MRI)   7.     Comment: WBC nown normal.         Plan:     1.  Reasonable to transition to Augmentin 875 mg BID for 5 more days   2.  Cleared for discharge from ID standpoint             Signed By:  Lorina Rabon, MD           January 11, 2021

## 2021-01-25 ENCOUNTER — Emergency Department: Admit: 2021-01-26 | Payer: MEDICARE | Primary: Internal Medicine

## 2021-01-25 ENCOUNTER — Emergency Department: Admit: 2021-01-25 | Payer: MEDICARE | Primary: Internal Medicine

## 2021-01-25 ENCOUNTER — Inpatient Hospital Stay
Admit: 2021-01-25 | Discharge: 2021-01-28 | Disposition: A | Discharge status: Skilled Nursing Facility | Payer: MEDICARE | Attending: Internal Medicine | Admitting: Internal Medicine

## 2021-01-25 DIAGNOSIS — S72002A Fracture of unspecified part of neck of left femur, initial encounter for closed fracture: Secondary | ICD-10-CM

## 2021-01-25 DIAGNOSIS — S72012A Unspecified intracapsular fracture of left femur, initial encounter for closed fracture: Principal | ICD-10-CM

## 2021-01-25 LAB — CBC WITH AUTOMATED DIFF
ABS. BASOPHILS: 0.1 10*3/uL (ref 0.0–0.1)
ABS. EOSINOPHILS: 0.8 10*3/uL — ABNORMAL HIGH (ref 0.0–0.4)
ABS. IMM. GRANS.: 0.1 10*3/uL — ABNORMAL HIGH (ref 0.00–0.04)
ABS. LYMPHOCYTES: 2.1 10*3/uL (ref 0.8–3.5)
ABS. MONOCYTES: 0.4 10*3/uL (ref 0.0–1.0)
ABS. NEUTROPHILS: 6.3 10*3/uL (ref 1.8–8.0)
ABSOLUTE NRBC: 0 10*3/uL (ref 0.00–0.01)
BASOPHILS: 1 % (ref 0–1)
EOSINOPHILS: 9 % — ABNORMAL HIGH (ref 0–7)
HCT: 37 % (ref 35.0–47.0)
HGB: 11.9 g/dL (ref 11.5–16.0)
IMMATURE GRANULOCYTES: 1 % — ABNORMAL HIGH (ref 0–0.5)
LYMPHOCYTES: 21 % (ref 12–49)
MCH: 31 pg (ref 26.0–34.0)
MCHC: 32.2 g/dL (ref 30.0–36.5)
MCV: 96.4 fL (ref 80.0–99.0)
MONOCYTES: 5 % (ref 5–13)
MPV: 9.1 fL (ref 8.9–12.9)
NEUTROPHILS: 63 % (ref 32–75)
NRBC: 0 /100{WBCs}
PLATELET: 410 10*3/uL — ABNORMAL HIGH (ref 150–400)
RBC: 3.84 M/uL (ref 3.80–5.20)
RDW: 12.5 % (ref 11.5–14.5)
WBC: 9.7 10*3/uL (ref 3.6–11.0)

## 2021-01-25 LAB — TYPE & SCREEN
ABO/Rh(D): O POS
Antibody screen: NEGATIVE

## 2021-01-25 LAB — METABOLIC PANEL, COMPREHENSIVE
A-G Ratio: 1 — ABNORMAL LOW (ref 1.1–2.2)
ALT (SGPT): 23 U/L (ref 12–78)
AST (SGOT): 19 U/L (ref 15–37)
Albumin: 3.3 g/dL — ABNORMAL LOW (ref 3.5–5.0)
Alk. phosphatase: 102 U/L (ref 45–117)
Anion gap: 7 mmol/L (ref 5–15)
BUN/Creatinine ratio: 22 — ABNORMAL HIGH (ref 12–20)
BUN: 20 mg/dL (ref 6–20)
Bilirubin, total: 0.2 mg/dL (ref 0.2–1.0)
CO2: 24 mmol/L (ref 21–32)
Calcium: 9.2 mg/dL (ref 8.5–10.1)
Chloride: 108 mmol/L (ref 97–108)
Creatinine: 0.92 mg/dL (ref 0.55–1.02)
Globulin: 3.4 g/dL (ref 2.0–4.0)
Glucose: 98 mg/dL (ref 65–100)
Potassium: 4.2 mmol/L (ref 3.5–5.1)
Protein, total: 6.7 g/dL (ref 6.4–8.2)
Sodium: 139 mmol/L (ref 136–145)
eGFR: >60 mL/min/{1.73_m2} (ref >60)

## 2021-01-25 LAB — COVID-19 RAPID TEST: COVID-19 rapid test: DETECTED — AB

## 2021-01-25 LAB — PROTHROMBIN TIME + INR
INR: 1 (ref 0.9–1.1)
Prothrombin time: 13.7 s (ref 11.9–14.6)

## 2021-01-25 LAB — INFLUENZA A & B AG (RAPID TEST)
Influenza A Antigen: NEGATIVE
Influenza B Antigen: NEGATIVE

## 2021-01-25 LAB — CBC WITH AUTO DIFFERENTIAL
Basophils %: 1 % (ref 0–1)
Basophils Absolute: 0.1 10*3/uL (ref 0.0–0.1)
Eosinophils %: 9 % — ABNORMAL HIGH (ref 0–7)
Eosinophils Absolute: 0.8 10*3/uL — ABNORMAL HIGH (ref 0.0–0.4)
Granulocyte Absolute Count: 0.1 10*3/uL — ABNORMAL HIGH (ref 0.00–0.04)
Hematocrit: 37 % (ref 35.0–47.0)
Hemoglobin: 11.9 g/dL (ref 11.5–16.0)
Immature Granulocytes: 1 % — ABNORMAL HIGH (ref 0–0.5)
Lymphocytes %: 21 % (ref 12–49)
Lymphocytes Absolute: 2.1 10*3/uL (ref 0.8–3.5)
MCH: 31 PG (ref 26.0–34.0)
MCHC: 32.2 g/dL (ref 30.0–36.5)
MCV: 96.4 FL (ref 80.0–99.0)
MPV: 9.1 FL (ref 8.9–12.9)
Monocytes %: 5 % (ref 5–13)
Monocytes Absolute: 0.4 10*3/uL (ref 0.0–1.0)
NRBC Absolute: 0 10*3/uL (ref 0.00–0.01)
Neutrophils %: 63 % (ref 32–75)
Neutrophils Absolute: 6.3 10*3/uL (ref 1.8–8.0)
Nucleated RBCs: 0 PER 100 WBC
Platelets: 410 10*3/uL — ABNORMAL HIGH (ref 150–400)
RBC: 3.84 M/uL (ref 3.80–5.20)
RDW: 12.5 % (ref 11.5–14.5)
WBC: 9.7 10*3/uL (ref 3.6–11.0)

## 2021-01-25 LAB — TYPE AND SCREEN
ABO/Rh: O POS
Antibody Screen: NEGATIVE

## 2021-01-25 LAB — COMPREHENSIVE METABOLIC PANEL
ALT: 23 U/L (ref 12–78)
AST: 19 U/L (ref 15–37)
Albumin/Globulin Ratio: 1 — ABNORMAL LOW (ref 1.1–2.2)
Albumin: 3.3 g/dL — ABNORMAL LOW (ref 3.5–5.0)
Alkaline Phosphatase: 102 U/L (ref 45–117)
Anion Gap: 7 mmol/L (ref 5–15)
BUN: 20 mg/dL (ref 6–20)
Bun/Cre Ratio: 22 — ABNORMAL HIGH (ref 12–20)
CO2: 24 mmol/L (ref 21–32)
Calcium: 9.2 mg/dL (ref 8.5–10.1)
Chloride: 108 mmol/L (ref 97–108)
Creatinine: 0.92 mg/dL (ref 0.55–1.02)
ESTIMATED GLOMERULAR FILTRATION RATE: >60 mL/min/{1.73_m2} (ref >60)
Globulin: 3.4 g/dL (ref 2.0–4.0)
Glucose: 98 mg/dL (ref 65–100)
Potassium: 4.2 mmol/L (ref 3.5–5.1)
Sodium: 139 mmol/L (ref 136–145)
Total Bilirubin: 0.2 mg/dL (ref 0.2–1.0)
Total Protein: 6.7 g/dL (ref 6.4–8.2)

## 2021-01-25 LAB — RAPID INFLUENZA A/B ANTIGENS
Flu A Antigen: NEGATIVE
Influenza B Antigen: NEGATIVE

## 2021-01-25 LAB — COVID-19, RAPID: SARS-CoV-2, Rapid: DETECTED — AB

## 2021-01-25 LAB — PROTIME-INR
INR: 1 (ref 0.9–1.1)
Protime: 13.7 s (ref 11.9–14.6)

## 2021-01-25 MED ORDER — SODIUM CHLORIDE 0.9 % IJ SYRG
Freq: Three times a day (TID) | INTRAMUSCULAR | Status: AC
Start: 2021-01-25 — End: 2021-01-26
  Administered 2021-01-25 – 2021-01-26 (×3): via INTRAVENOUS

## 2021-01-25 MED ORDER — MELATONIN 3 MG TAB
3 mg | Freq: Every evening | ORAL | Status: AC | PRN
Start: 2021-01-25 — End: 2021-01-28
  Administered 2021-01-26: 05:00:00 via ORAL

## 2021-01-25 MED ORDER — ACETAMINOPHEN 325 MG TABLET
325 mg | ORAL | Status: AC
Start: 2021-01-25 — End: 2021-01-25
  Administered 2021-01-25: 22:00:00 via ORAL

## 2021-01-25 MED ORDER — ACETAMINOPHEN 325 MG RECTAL SUPPOSITORY
325 mg | Freq: Four times a day (QID) | RECTAL | Status: AC | PRN
Start: 2021-01-25 — End: 2021-01-28

## 2021-01-25 MED ORDER — MORPHINE 2 MG/ML INJECTION
2 mg/mL | INTRAMUSCULAR | Status: DC | PRN
Start: 2021-01-25 — End: 2021-01-25

## 2021-01-25 MED ORDER — ENOXAPARIN 30 MG/0.3 ML SUB-Q SYRINGE
30 mg/0.3 mL | Freq: Every day | SUBCUTANEOUS | Status: AC
Start: 2021-01-25 — End: 2021-01-26
  Administered 2021-01-26: 14:00:00 via SUBCUTANEOUS

## 2021-01-25 MED ORDER — SODIUM CHLORIDE 0.9 % IJ SYRG
Freq: Three times a day (TID) | INTRAMUSCULAR | Status: DC
Start: 2021-01-25 — End: 2021-01-25

## 2021-01-25 MED ORDER — ONDANSETRON 4 MG TAB, RAPID DISSOLVE
4 mg | Freq: Three times a day (TID) | ORAL | Status: AC | PRN
Start: 2021-01-25 — End: 2021-01-28

## 2021-01-25 MED ORDER — HYDRALAZINE 20 MG/ML IJ SOLN
20 mg/mL | Freq: Four times a day (QID) | INTRAMUSCULAR | Status: AC | PRN
Start: 2021-01-25 — End: 2021-01-28

## 2021-01-25 MED ORDER — SODIUM CHLORIDE 0.9 % IJ SYRG
INTRAMUSCULAR | Status: DC | PRN
Start: 2021-01-25 — End: 2021-01-25

## 2021-01-25 MED ORDER — ACETAMINOPHEN 325 MG TABLET
325 mg | Freq: Four times a day (QID) | ORAL | Status: DC | PRN
Start: 2021-01-25 — End: 2021-01-28
  Administered 2021-01-26: 09:00:00 via ORAL

## 2021-01-25 MED ORDER — POLYETHYLENE GLYCOL 3350 17 GRAM (100 %) ORAL POWDER PACKET
17 gram | Freq: Every day | ORAL | Status: AC | PRN
Start: 2021-01-25 — End: 2021-01-28

## 2021-01-25 MED ORDER — MORPHINE 2 MG/ML INJECTION
2 mg/mL | INTRAMUSCULAR | Status: AC | PRN
Start: 2021-01-25 — End: 2021-01-27
  Administered 2021-01-26: 09:00:00 via INTRAVENOUS

## 2021-01-25 MED ORDER — OXYCODONE 5 MG TAB
5 mg | ORAL | Status: AC | PRN
Start: 2021-01-25 — End: 2021-01-28
  Administered 2021-01-26 – 2021-01-27 (×5): via ORAL

## 2021-01-25 MED ORDER — ONDANSETRON (PF) 4 MG/2 ML INJECTION
4 mg/2 mL | Freq: Four times a day (QID) | INTRAMUSCULAR | Status: AC | PRN
Start: 2021-01-25 — End: 2021-01-28

## 2021-01-25 MED ORDER — NALOXONE 0.4 MG/ML INJECTION
0.4 mg/mL | INTRAMUSCULAR | Status: AC | PRN
Start: 2021-01-25 — End: 2021-01-28

## 2021-01-25 MED ORDER — SODIUM CHLORIDE 0.9 % IJ SYRG
INTRAMUSCULAR | Status: AC | PRN
Start: 2021-01-25 — End: 2021-01-28

## 2021-01-25 MED ORDER — SODIUM CHLORIDE 0.9 % IV
INTRAVENOUS | Status: AC
Start: 2021-01-25 — End: 2021-01-28
  Administered 2021-01-26: 01:00:00 via INTRAVENOUS

## 2021-01-25 MED FILL — SODIUM CHLORIDE 0.9 % IJ SYRG: INTRAMUSCULAR | Qty: 40

## 2021-01-25 MED FILL — TYLENOL 325 MG TABLET: 325 mg | ORAL | Qty: 3

## 2021-01-25 MED FILL — SODIUM CHLORIDE 0.9 % IV: INTRAVENOUS | Qty: 1000

## 2021-01-25 NOTE — ED Notes (Signed)
Pt arrived via ems from [facility], pt had a ground level fall today, no loc no thinners, no hitting of head    Pt had xray at [facility] and showed a hip fracture.     Pt on arrival is a and o x4     Pt normally ambulatory and was getting ready to be discharge from [facility] today.

## 2021-01-25 NOTE — ED Provider Notes (Signed)
ED Provider Notes by Thurman Coyer, MD at 01/25/21 1533                Author: Thurman Coyer, MD  Service: Emergency Medicine  Author Type: Physician       Filed: 01/25/21 2218  Date of Service: 01/25/21 1533  Status: Signed          Editor: Thurman Coyer, MD (Physician)               EMERGENCY DEPARTMENT HISTORY AND PHYSICAL EXAM           Date: 01/25/2021   Patient Name: [name]        History of Presenting Illness          Chief Complaint       Patient presents with        ?  Hip Pain             Left           History Provided By: Patient      HPI: Corrine Tillis, 76 y.o. female presents for evaluation of left hip pain.  Patient had a mechanical fall at the facility where she lives after  "tripping ".  She denies any presyncopal symptoms, Denies any subsequent head strike or loss of consciousness.  She landed on the left leg and endorses pain in the left hip and knee.  She denies any distal loss of motor or sensation.  No other injuries  noted.  She is not on any blood thinners.      There are no other complaints, changes, or physical findings at this time.        Past History        Past Medical History:     Past Medical History:        Diagnosis  Date         ?  Chronic obstructive pulmonary disease (HCC)       ?  Hypercholesteremia       ?  Insomnia           ?  Sleep disorder             Past Surgical History:   No past surgical history on file.      Family History:   History reviewed. No pertinent family history.      Social History:     Social History          Tobacco Use         ?  Smoking status:  Former              Packs/day:  1.00         Types:  Cigarettes         Quit date:  2021         Years since quitting:  2.0         Passive exposure:  Past         ?  Smokeless tobacco:  Never       Vaping Use         ?  Vaping Use:  Never used       Substance Use Topics         ?  Alcohol use:  Yes              Alcohol/week:  1.0 standard drink         Types:  1 Cans of beer  per  week             Comment: 1 can of beer every 1-2 weeks         ?  Drug use:  Never           Allergies:     Allergies        Allergen  Reactions         ?  Cheese  Nausea and Vomiting             Severe vomiting           PCP: Cioflec, Adela Ports, MD        No current facility-administered medications on file prior to encounter.          Current Outpatient Medications on File Prior to Encounter          Medication  Sig  Dispense  Refill           ?  amitriptyline (ELAVIL) 75 mg tablet  Take 1 Tablet by mouth nightly for 30 days.  30 Tablet  0     ?  acetaminophen (TYLENOL) 325 mg tablet  Take 325 mg by mouth every four (4) hours as needed for Pain (Take 2 tablets by mouth as needed for pain).         ?  atorvastatin (LIPITOR) 40 mg tablet  Take 40 mg by mouth daily.         ?  metoprolol succinate (TOPROL-XL) 25 mg XL tablet  Take 25 mg by mouth daily.         ?  fluticasone-umeclidin-vilanter (TRELEGY ELLIPTA) 200-62.5-25 mcg inhaler  Trelegy Ellipta 200 mcg-62.5 mcg-25 mcg powder for inhalation    INHALE 1 PUFF BY MOUTH ONCE A DAY         ?  albuterol (PROVENTIL HFA, VENTOLIN HFA, PROAIR HFA) 90 mcg/actuation inhaler  albuterol sulfate HFA 90 mcg/actuation aerosol inhaler    INHALE 2 PUFFS EVERY 6 HOURS AS NEEDED               ?  omeprazole (PRILOSEC) 20 mg capsule  Take 20 mg by mouth daily.                 Review of Systems     Review of Systems    Constitutional:  Negative for fever.    HENT:  Negative for congestion.     Respiratory:  Negative for cough and shortness of breath.     Cardiovascular:  Negative for chest pain.    Gastrointestinal:  Negative for abdominal pain, constipation, nausea and vomiting.    Genitourinary:  Negative for dysuria.    Musculoskeletal:  Negative for arthralgias and myalgias.    Skin:  Negative for rash.    Allergic/Immunologic: Negative for immunocompromised state.    Neurological:  Negative for syncope.    Psychiatric/Behavioral:  Negative for confusion.           Physical Exam      Physical Exam   HENT:       Head: Normocephalic and atraumatic.       Nose: Nose normal.       Mouth/Throat:       Mouth: Mucous membranes are moist.    Eyes:       Extraocular Movements: Extraocular movements intact.       Pupils: Pupils are equal, round, and reactive to light.     Cardiovascular:  Rate and Rhythm: Normal rate and regular rhythm.    Pulmonary:       Effort: Pulmonary effort is normal.     Abdominal:       General: There is no distension.     Musculoskeletal:          General: No deformity.       Cervical back: Normal range of motion.       Comments: Left leg shortened and externally rotated.  Tenderness in the left hip and knee.  Distal motor and sensation intact.  DP and PT pulses 2+.     Skin:      General: Skin is warm and dry.    Neurological:       General: No focal deficit present.       Mental Status: She is alert.    Psychiatric:          Mood and Affect: Mood normal.             Lab and Diagnostic Study Results     Labs -         Recent Results (from the past 12 hour(s))     CBC WITH AUTOMATED DIFF          Collection Time: 01/25/21  4:08 PM         Result  Value  Ref Range            WBC  9.7  3.6 - 11.0 K/uL            RBC  3.84  3.80 - 5.20 M/uL            HGB  11.9  11.5 - 16.0 g/dL       HCT  37.0  35.0 - 47.0 %       MCV  96.4  80.0 - 99.0 FL       MCH  31.0  26.0 - 34.0 PG       MCHC  32.2  30.0 - 36.5 g/dL       RDW  12.5  11.5 - 14.5 %       PLATELET  410 (H)  150 - 400 K/uL       MPV  9.1  8.9 - 12.9 FL       NRBC  0.0  0.0 PER 100 WBC       ABSOLUTE NRBC  0.00  0.00 - 0.01 K/uL       NEUTROPHILS  63  32 - 75 %       LYMPHOCYTES  21  12 - 49 %       MONOCYTES  5  5 - 13 %       EOSINOPHILS  9 (H)  0 - 7 %       BASOPHILS  1  0 - 1 %       IMMATURE GRANULOCYTES  1 (H)  0 - 0.5 %       ABS. NEUTROPHILS  6.3  1.8 - 8.0 K/UL       ABS. LYMPHOCYTES  2.1  0.8 - 3.5 K/UL       ABS. MONOCYTES  0.4  0.0 - 1.0 K/UL       ABS. EOSINOPHILS  0.8 (H)  0.0 - 0.4 K/UL       ABS.  BASOPHILS  0.1  0.0 - 0.1 K/UL       ABS. IMM. GRANS.  0.1 (H)  0.00 -  0.04 K/UL       DF  AUTOMATED          METABOLIC PANEL, COMPREHENSIVE          Collection Time: 01/25/21  4:08 PM         Result  Value  Ref Range            Sodium  139  136 - 145 mmol/L       Potassium  4.2  3.5 - 5.1 mmol/L       Chloride  108  97 - 108 mmol/L       CO2  24  21 - 32 mmol/L       Anion gap  7  5 - 15 mmol/L            Glucose  98  65 - 100 mg/dL            BUN  20  6 - 20 mg/dL       Creatinine  0.92  0.55 - 1.02 mg/dL       BUN/Creatinine ratio  22 (H)  12 - 20         eGFR  >60  >60 ml/min/1.69m       Calcium  9.2  8.5 - 10.1 mg/dL       Bilirubin, total  0.2  0.2 - 1.0 mg/dL       AST (SGOT)  19  15 - 37 U/L       ALT (SGPT)  23  12 - 78 U/L       Alk. phosphatase  102  45 - 117 U/L       Protein, total  6.7  6.4 - 8.2 g/dL       Albumin  3.3 (L)  3.5 - 5.0 g/dL       Globulin  3.4  2.0 - 4.0 g/dL       A-G Ratio  1.0 (L)  1.1 - 2.2         PROTHROMBIN TIME + INR          Collection Time: 01/25/21  4:08 PM         Result  Value  Ref Range            Prothrombin time  13.7  11.9 - 14.6 sec       INR  1.0  0.9 - 1.1         TYPE & SCREEN          Collection Time: 01/25/21  4:08 PM         Result  Value  Ref Range            Crossmatch Expiration  01/28/2021,2359         ABO/Rh(D)  OJenetta DownerPositive         Antibody screen  Negative         COVID-19 RAPID TEST          Collection Time: 01/25/21  5:47 PM         Result  Value  Ref Range            COVID-19 rapid test  DETECTED (A)  Not Detected         INFLUENZA A & B AG (RAPID TEST)          Collection Time: 01/25/21  5:47 PM         Result  Value  Ref Range  Influenza A Antigen  Negative  Negative              Influenza B Antigen  Negative  Negative             Radiologic Studies -    @lastxrresult @     CT Results  (Last 48 hours)                                       01/25/21 2010    CT HIP LT WO CONT  Final result            Impression:           Left femoral neck  subcapital fracture with 2.5 cm proximal migration. No      underlying bone lesion. No significant arthritis.                       Narrative:    EXAM: CT HIP LT WO CONT             INDICATION: Left femoral neck fracture, left hip pain.              COMPARISON: Left hip Views earlier this afternoon.             TECHNIQUE: Helical CT of the left hip with coronal and sagittal reformats.      Images reviewed in soft tissue and bone windows.  CT dose reduction was achieved      through the use of a standardized protocol tailored for this examination and      automatic exposure control for dose modulation.             CONTRAST: None.             FINDINGS: Bones: Transverse subcapital fracture of the left femoral neck is      unchanged. 2.5 cm proximal migration of the distal fragment. Mild impaction. No      underlying bone lesion. No other fracture. Mild osteopenia.             Spine: No significant stenosis.             Joint fluid: None.             Articulations: No significant osteoarthritis. No evidence of inflammatory      arthritis.             Tendons: No full-thickness tendon tear.             Muscles: Moderate patchy atrophy.             Soft tissue mass: None. Arterial atherosclerosis is partially imaged. Colonic      diverticulosis is partially imaged. No measurable mass or lymphadenopathy.                                      CXR Results  (Last 48 hours)             None                       Medical Decision Making and ED Course     Differential Diagnosis & Medical Decision Making Provider Note:    76 year old female presents for evaluation of left hip pain.  Suspect fracture based  on physical exam and report from facility of fracture seen on x-ray.  Will evaluate with x-ray.  Patient additionally  endorsing pain in the left knee, differential includes contusion versus fracture and will evaluate with x-ray.  Sending basic labs, CBC, BMP, coags in anticipation of likely surgery.      - I am the first  provider for this patient.  I reviewed the vital signs, available nursing notes, past medical history, past surgical history, family history and social history. The patients presenting problems have been discussed, and they are in agreement  with the care plan formulated and outlined with them.  I have encouraged them to ask questions as they arise throughout their visit.      Vital Signs-Reviewed the patient's vital signs.   Patient Vitals for the past 12 hrs:            Temp  Pulse  Resp  BP  SpO2            01/25/21 1945  --  100  18  (!) 139/92  94 %            01/25/21 1528  --  (!) 104  --  (!) 151/87  --            01/25/21 1522  98.5 ??F (36.9 ??C)  82  15  --  94 %           ED Course:      ED Course as of 01/25/21 2218       Tue Jan 25, 2021        1552  Last oral intake reportedly 10 AM this morning [BQ]     1621  XR HIP LT W OR WO PELV 2-3 VWS   IMPRESSION   Displaced left femoral neck fracture. [BQ]     Golf   Consultant: Dr. Rozetta Nunnery   Specialty: Ortho surgery   Spoke with consultant, he recommends n.p.o. at midnight, admission to medical service for likely operative repair tomorrow. [BQ]              ED Course User Index   [BQ] Thurman Coyer, MD                Procedures     Performed by: Thurman Coyer, MD   Procedures          Disposition     Disposition: Ad[facility] to [facility] the case was discussed with the admitting physician Deatra Canter        Diagnosis/Clinical Impression        Clinical Impression:       1.  Left displaced femoral neck fracture (HCC)            Attestations: I,  Thurman Coyer, MD, am the primary clinician of record.      Please note that this dictation was completed with Dragon, the computer voice recognition software.  Quite often unanticipated grammatical, syntax, homophones, and other interpretive errors are inadvertently  transcribed by the computer software.  Please disregard these errors.  Please excuse any errors that have escaped final  proofreading.  Thank you.

## 2021-01-25 NOTE — ED Notes (Signed)
Assumed care of patient. Without acute distress or respiratory distress

## 2021-01-25 NOTE — Consults (Signed)
Consults  by Bebe ShaggySingaraju, Taelynn Mcelhannon M, MD at 01/25/21 1649                Author: Bebe ShaggySingaraju, Shephanie Romas M, MD  Service: Orthopedic Surgery  Author Type: Physician       Filed: 01/26/21 1608  Date of Service: 01/25/21 1649  Status: Signed          Editor: Bebe ShaggySingaraju, Lex Linhares M, MD (Physician)            Consult Orders        1. IP CONSULT TO ORTHOPEDIC SURGERY [[id number]][[id number]] ordered by Carmon SailsSchneider, [place], PA-C at 01/25/21 1650                                               Consult          Patient: [name]  MRN: [id number]   SSN: [id number]          Date of Birth: 11/14/1945   Age: 76 y.o.   Sex: female           Subjective:         Caitlin MoccasinJoyce Pierron is a 76 y.o. female who is being [facility] for left femoral neck [facility] while she was being discharged and getting out..  Patient is ambulatory prior to the fall.  Denies any antecedent hip pain.      Denies chest pain / SOB . Clinical history positive for COPD, HLD and recent COVID infection.  Regarding the  COVID infection patient is unsure when she got it and is asymptomatic      Past Medical History:        Diagnosis  Date         ?  Chronic obstructive pulmonary disease (HCC)       ?  Hypercholesteremia       ?  Insomnia           ?  Sleep disorder          No past surgical history on file.    History reviewed. No pertinent family history.     Social History          Tobacco Use         ?  Smoking status:  Former              Packs/day:  1.00         Types:  Cigarettes         Quit date:  2021         Years since quitting:  2.0         Passive exposure:  Past         ?  Smokeless tobacco:  Never       Substance Use Topics         ?  Alcohol use:  Yes              Alcohol/week:  1.0 standard drink         Types:  1 Cans of beer per week             Comment: 1 can of beer every 1-2 weeks           Current Facility-Administered Medications             Medication  Dose  Route  Frequency  Provider  Last Rate  Last Admin              ?  0.9%  sodium chloride infusion   75 mL/hr  IntraVENous  CONTINUOUS  Brylon Brenning M, MD           ?  sodium chloride (NS) flush 5-40 mL   5-40 mL  IntraVENous  Q8H  Laverne Klugh M, MD     10 mL at 01/25/21 1654              ?  sodium chloride (NS) flush 5-40 mL   5-40 mL  IntraVENous  PRN  Alda Gaultney, Tonie Griffith, MD                    ?  naloxone (NARCAN) injection 0.4 mg   0.4 mg  IntraVENous  PRN  Eevie Lapp, Tonie Griffith, MD           ?  acetaminophen (TYLENOL) tablet 650 mg   650 mg  Oral  Q6H PRN  Carmon Sails, PA-C                Or              ?  acetaminophen (TYLENOL) suppository 650 mg   650 mg  Rectal  Q6H PRN  Carmon Sails, PA-C           ?  polyethylene glycol (MIRALAX) packet 17 g   17 g  Oral  DAILY PRN  Carmon Sails, PA-C           ?  ondansetron (ZOFRAN ODT) tablet 4 mg   4 mg  Oral  Q8H PRN  Carmon Sails, PA-C                Or              ?  ondansetron (ZOFRAN) injection 4 mg   4 mg  IntraVENous  Q6H PRN  Carmon Sails, PA-C           ?  [START ON 01/26/2021] enoxaparin (LOVENOX) injection 30 mg   30 mg  SubCUTAneous  DAILY  Carmon Sails, PA-C           ?  oxyCODONE IR (ROXICODONE) tablet 5 mg   5 mg  Oral  Q4H PRN  Carmon Sails, PA-C           ?  hydrALAZINE (APRESOLINE) 20 mg/mL injection 10 mg   10 mg  IntraVENous  Q6H PRN  Carmon Sails, PA-C           ?  melatonin tablet 9 mg   9 mg  Oral  QHS PRN  Carmon Sails, PA-C                    ?  morphine injection 1 mg   1 mg  IntraVENous  Q4H PRN  Carmon Sails, PA-C                Current Outpatient Medications          Medication  Sig  Dispense  Refill           ?  amitriptyline (ELAVIL) 75 mg tablet  Take 1 Tablet by mouth nightly for 30 days.  30 Tablet  0     ?  acetaminophen (TYLENOL) 325 mg tablet  Take 325 mg by mouth every four (4) hours as needed for Pain (Take 2  tablets by mouth as needed for pain).         ?  atorvastatin (LIPITOR) 40 mg tablet  Take 40 mg by mouth daily.         ?   metoprolol succinate (TOPROL-XL) 25 mg XL tablet  Take 25 mg by mouth daily.         ?  fluticasone-umeclidin-vilanter (TRELEGY ELLIPTA) 200-62.5-25 mcg inhaler  Trelegy Ellipta 200 mcg-62.5 mcg-25 mcg powder for inhalation    INHALE 1 PUFF BY MOUTH ONCE A DAY               ?  albuterol (PROVENTIL HFA, VENTOLIN HFA, PROAIR HFA) 90 mcg/actuation inhaler  albuterol sulfate HFA 90 mcg/actuation aerosol inhaler    INHALE 2 PUFFS EVERY 6 HOURS AS NEEDED               ?  omeprazole (PRILOSEC) 20 mg capsule  Take 20 mg by mouth daily.                  Allergies        Allergen  Reactions         ?  Cheese  Nausea and Vomiting             Severe vomiting           Review of Systems:   A comprehensive review of systems was negative except for that written in the History of Present Illness.      Positive for left hip and knee pain        Objective:          Vitals:           01/25/21 1522  01/25/21 1528         BP:    (!) 151/87     Pulse:  82  (!) 104     Resp:  15       Temp:  98.5 ??F (36.9 ??C)       SpO2:  94%       Weight:  45.8 kg (101 lb)           Height:  5\' 3"  (1.6 m)              Physical Exam:      General:   Alert, cooperative, no distress, appears stated age.        Eyes:   Conjunctivae/corneas clear. PERRL, EOMs intact. Fundi benign        Ears:   Normal TMs and external ear canals both ears.        Nose:  Nares normal. Septum midline. Mucosa normal. No drainage or sinus tenderness.        Mouth/Throat:  Lips, mucosa, and tongue normal. Teeth and gums normal.        Neck:  Supple, symmetrical, trachea midline, no adenopathy, thyroid: no enlargment/tenderness/nodules, no carotid bruit and no JVD.     Back:    Symmetric, no curvature. ROM normal. No CVA tenderness.     Lungs:    Clear to auscultation bilaterally.     Heart:   Regular rate and rhythm, S1, S2 normal, no murmur, click, rub or gallop.     Abdomen:    Soft, non-tender. Bowel sounds normal. No masses,  No organomegaly.     Extremities:  See below       Pulses:  2+ and symmetric all extremities.     Skin:  Skin color,  texture, turgor normal. No rashes or lesions     Lymph nodes:  Cervical, supraclavicular, and axillary nodes normal.        Neurologic:  CNII-XII intact. Normal strength, sensation and reflexes throughout.        Left LE: shortened and externally rotated   Compartmetns soft, nontender   Dp(+)            Assessment:           Hospital Problems   Date Reviewed: 01/02/2021                            Codes  Class  Noted  POA              Hip fracture (HCC)  ICD-10-CM: J47.829FS72.009A   ICD-9-CM: 820.8    01/25/2021  Unknown                    CT Results  (Last 48 hours)                                       01/25/21 2010    CT HIP LT WO CONT  Final result            Impression:           Left femoral neck subcapital fracture with 2.5 cm proximal migration. No      underlying bone lesion. No significant arthritis.                       Narrative:    EXAM: CT HIP LT WO CONT             INDICATION: Left femoral neck fracture, left hip pain.              COMPARISON: Left hip Views earlier this afternoon.             TECHNIQUE: Helical CT of the left hip with coronal and sagittal reformats.      Images reviewed in soft tissue and bone windows.  CT dose reduction was achieved      through the use of a standardized protocol tailored for this examination and      automatic exposure control for dose modulation.             CONTRAST: None.             FINDINGS: Bones: Transverse subcapital fracture of the left femoral neck is      unchanged. 2.5 cm proximal migration of the distal fragment. Mild impaction. No      underlying bone lesion. No other fracture. Mild osteopenia.             Spine: No significant stenosis.             Joint fluid: None.             Articulations: No significant osteoarthritis. No evidence of inflammatory      arthritis.             Tendons: No full-thickness tendon tear.             Muscles: Moderate patchy atrophy.             Soft tissue mass:  None. Arterial atherosclerosis is partially imaged. Colonic      diverticulosis is  partially imaged. No measurable mass or lymphadenopathy.                                                  Plan:        76 y/o female with left hip femoral neck fracture       Recommend  left hip hemiarthroplasty. Risk of dislocation, periprosthetic fracture, leg length discrepancy , DVT/PE reviewed.       Informed surgical consent was obtained from the patient.  Inherent risk of COVID related hypercoagulability was also reviewed.  We will place the patient on Lovenox for DVT prophylaxis postsurgery.  Overall guarded prognosis reiterated to the patient.               Signed By:  Bebe Shaggy, MD           January 25, 2021             Left femoral neck fracture in 76 y/o female

## 2021-01-25 NOTE — H&P (Signed)
H&P by Carmon Sails, PA-C at 01/25/21 1701                Author: Carmon Sails, PA-C  Service: Physician Assistant  Author Type: Physician Assistant       Filed: 01/25/21 1710  Date of Service: 01/25/21 1701  Status: Signed           Editor: Mateo Flow (Physician Assistant)  Cosigner: Resa Miner, MD at 01/26/21 [phone]                         History and Physical                  Patient: [name]  MRN: [id number]   SSN: [id number]          Date of Birth: [birth year]   Age: 76 y.o.   Sex: female           Subjective:         Tiernan Millikin is a 76 y.o. female with a PMHx of COPD, HLD, and insomnia who presents to the ED after ground [facility] fall at [facility] today. Patient states she was getting ready for discharge from the facility today when she  rounding a corner and fell onto her left hip. She denies any dizziness, visual changes or LOC. Denies hitting her head. Reports left hip pain and left knee pain. Not on any anticoagulation.    In the ED, x-ray left hip showing displaced femoral neck fracture. X-ray left knee negative for fracture. CBC and CMP unremarkable. Patient admitted for further management. Orthopedic surgery consulted.         Past Medical History:        Diagnosis  Date         ?  Chronic obstructive pulmonary disease (HCC)       ?  Hypercholesteremia       ?  Insomnia           ?  Sleep disorder          No past surgical history on file.    History reviewed. No pertinent family history.     Social History          Tobacco Use         ?  Smoking status:  Former              Packs/day:  1.00         Types:  Cigarettes         Quit date:  2021         Years since quitting:  2.0         Passive exposure:  Past         ?  Smokeless tobacco:  Never       Substance Use Topics         ?  Alcohol use:  Yes              Alcohol/week:  1.0 standard drink         Types:  1 Cans of beer per week             Comment: 1 can of beer every 1-2 weeks            Prior to Admission medications             Medication  Sig  Start Date  End Date  Taking?  Authorizing Provider            amitriptyline (ELAVIL) 75 mg tablet  Take 1 Tablet by mouth nightly for 30 days.  01/07/21  02/06/21    Bishop, Tonye Pearson, PA-C            acetaminophen (TYLENOL) 325 mg tablet  Take 325 mg by mouth every four (4) hours as needed for Pain (Take 2 tablets by mouth as needed for pain).        Provider, Historical     atorvastatin (LIPITOR) 40 mg tablet  Take 40 mg by mouth daily.  12/23/20      Provider, Historical     metoprolol succinate (TOPROL-XL) 25 mg XL tablet  Take 25 mg by mouth daily.  12/30/20      Provider, Historical     fluticasone-umeclidin-vilanter (TRELEGY ELLIPTA) 200-62.5-25 mcg inhaler  Trelegy Ellipta 200 mcg-62.5 mcg-25 mcg powder for inhalation    INHALE 1 PUFF BY MOUTH ONCE A DAY  08/19/20      Provider, Historical     albuterol (PROVENTIL HFA, VENTOLIN HFA, PROAIR HFA) 90 mcg/actuation inhaler  albuterol sulfate HFA 90 mcg/actuation aerosol inhaler    INHALE 2 PUFFS EVERY 6 HOURS AS NEEDED        Provider, Historical            omeprazole (PRILOSEC) 20 mg capsule  Take 20 mg by mouth daily.  08/11/20      Provider, Historical              Allergies        Allergen  Reactions         ?  Cheese  Nausea and Vomiting             Severe vomiting           Review of Systems:   Constitutional: No fevers, No chills, No fatigue, No weakness   Eyes: No visual disturbance   ENT: No nasal congestion, No sore throat   Respiratory: No cough, No sputum, No wheezing, No SOB   Cardiovascular: No chest pain, No lower extremity edema, No Palpitations    Gastrointestinal: No nausea, No vomiting, No diarrhea, No constipation, No abdominal pain   Genitourinary: No frequency, No dysuria, No hematuria   Integument/Breast: No rash, No skin lesion(s), No dryness   Musculoskeletal: + left hip pain, + left knee pain, No arthralgias, No neck pain, No back  pain   Neurological: No headaches, No  dizziness, No confusion,  No seizures   Behavioral/Psychiatric: No anxiety, No depression           Objective:          Vitals:           01/25/21 1522  01/25/21 1528         BP:    (!) 151/87     Pulse:  82  (!) 104     Resp:  15       Temp:  98.5 ??F (36.9 ??C)       SpO2:  94%       Weight:  45.8 kg (101 lb)           Height:  5\' 3"  (1.6 m)              Physical Exam:   General: alert, cooperative, no distress   Eye: conjunctivae/corneas clear. PERRL, EOM's intact.    Throat and Neck: normal and no erythema or  exudates noted. No mass    Lung: clear to auscultation bilaterally   Heart: regular rate and rhythm, +S1/S2. No murmur or gallop. No JVD.   Abdomen: soft, non-tender. Bowel sounds normal. No masses,   Extremities:  External rotation left lower extremity. TTP over left femoral head.  Able to move all extremities.   Skin: Warm, dry, intact. No rashes or lesions.   Neurologic: AOx3. Cranial nerves 2-12 and sensation grossly intact.   Psychiatric: non focal        Recent Results (from the past 24 hour(s))     CBC WITH AUTOMATED DIFF          Collection Time: 01/25/21  4:08 PM         Result  Value  Ref Range            WBC  9.7  3.6 - 11.0 K/uL       RBC  3.84  3.80 - 5.20 M/uL       HGB  11.9  11.5 - 16.0 g/dL       HCT  15.1  76.1 - 47.0 %       MCV  96.4  80.0 - 99.0 FL       MCH  31.0  26.0 - 34.0 PG       MCHC  32.2  30.0 - 36.5 g/dL       RDW  60.7  37.1 - 14.5 %       PLATELET  410 (H)  150 - 400 K/uL       MPV  9.1  8.9 - 12.9 FL       NRBC  0.0  0.0 PER 100 WBC       ABSOLUTE NRBC  0.00  0.00 - 0.01 K/uL       NEUTROPHILS  63  32 - 75 %       LYMPHOCYTES  21  12 - 49 %       MONOCYTES  5  5 - 13 %       EOSINOPHILS  9 (H)  0 - 7 %       BASOPHILS  1  0 - 1 %       IMMATURE GRANULOCYTES  1 (H)  0 - 0.5 %       ABS. NEUTROPHILS  6.3  1.8 - 8.0 K/UL       ABS. LYMPHOCYTES  2.1  0.8 - 3.5 K/UL       ABS. MONOCYTES  0.4  0.0 - 1.0 K/UL       ABS. EOSINOPHILS  0.8 (H)  0.0 - 0.4 K/UL       ABS. BASOPHILS  0.1   0.0 - 0.1 K/UL       ABS. IMM. GRANS.  0.1 (H)  0.00 - 0.04 K/UL       DF  AUTOMATED          PROTHROMBIN TIME + INR          Collection Time: 01/25/21  4:08 PM         Result  Value  Ref Range            Prothrombin time  13.7  11.9 - 14.6 sec            INR  1.0  0.9 - 1.1             XR Results (maximum last 3):   Results from [facility]  Encounter encounter on 01/25/21      XR KNEE LT 3 V      Narrative   EXAM: XR KNEE LT 3 V      INDICATION: Left knee pain after trauma eval for fracture.      COMPARISON: None.      FINDINGS: Three views of the left knee demonstrate no fracture or other acute   osseous or articular abnormality. There is no effusion.      Impression   No acute abnormality.         XR HIP LT W OR WO PELV 2-3 VWS      Narrative   EXAM: XR HIP LT W OR WO PELV 2-3 VWS      INDICATION: Status post fall with left hip pain      COMPARISON: 11/27/2020.      FINDINGS: AP view of the pelvis and a frogleg lateral view of the left hip   demonstrate a left femoral neck fracture with superior displacement of the   distal femoral fracture fragment. Degenerative changes are seen in the hip   joints bilaterally. Vascular calcification is noted.      Impression   Displaced left femoral neck fracture.         Results from Hospital Encounter encounter on 01/02/21      XR CHEST PORT      Narrative   EXAM:  XR CHEST PORT      INDICATION:   Tachycardia      COMPARISON: Chest radiographs from 11/27/2020 and 03/20/2020.      FINDINGS: AP radiograph of the chest was obtained.      No evidence of focal consolidation. Previously demonstrated ill-defined nodular   opacity in the left upper lung is not definitively seen on current study and   likely represent interval resolution versus artifactual. No pleural effusion or   pneumothorax.  Heart, hila, mediastinum are within normal limits.      No acute osseous abnormalities.      Impression   No acute cardiopulmonary process.         CT Results (maximum last 3):   Results from  Hospital Encounter encounter on 01/02/21      CT CHEST WO CONT      Narrative   Clinical history: Possible pneumonia on MRI thoracic   INDICATION:   Possible pneumonia on MRI thoracic   COMPARISON:  Correlation with thoracic spine MRI 01/05/2019      TECHNIQUE:   Thin axial images were obtained through the chest. Coronal and sagittal   reconstructions were generated. Oral contrast was not administered.      CT dose reduction was achieved through use of a standardized protocol tailored   for this examination and automatic exposure control for dose modulation.   Adaptive statistical iterative reconstruction (ASIR) was utilized.      FINDINGS:      SUPRACLAVICULAR REGION: No supraclavicular adenopathy.   MEDIASTINUM: Moderate coronary artery disease.  No hilar or mediastinal   lymphadenopathy. No esophageal mass. No endotracheal or endobronchial mass.   PLEURA/LUNGS:: Right basilar atelectasis and trace effusion. No nodule, mass, or   airspace disease.   SOFT TISSUE/ AXILLA: No axillary adenopathy. No chest wall mass.   UPPER ABDOMEN: Small hiatal hernia There is no intrahepatic duct dilatation. The   CBD is not dilated.   BONES: No destructive bone lesion.. No lytic or blastic lesions. T12 compression   fracture as demonstrated on MRI.  Impression   Minimal right basilar atelectasis/consolidation with trace right-sided effusion.   Chronic T5, T7 and T10 vertebral compression fractures.   Moderate coronary vascular calcifications.         CT SPINE CERV WO CONT      Narrative   EXAM:  CT CERVICAL SPINE WITHOUT CONTRAST      INDICATION: fall.      COMPARISON: None.      CONTRAST:  None.      TECHNIQUE: Multislice helical CT of the cervical spine was performed without   intravenous contrast administration.  Sagittal and coronal reformats were   generated.  CT dose reduction was achieved through use of a standardized   protocol tailored for this examination and automatic exposure control for dose   modulation.       FINDINGS:      The alignment is within normal limits. There is no fracture or compression   deformity. The odontoid process is intact. The craniocervical junction is within   normal limits. Significant multilevel spondylosis is noted, greatest at C3-4,   C4-5, C5-C6. Retrolisthesis is noted at these levels resulting in moderate   spinal stenosis. Bilateral neural foraminal narrowing is also noted at these   levels secondary to uncovertebral osteophyte formation.      Impression   Multilevel spondylosis without acute abnormality.         CT HEAD WO CONT      Narrative   EXAM: CT HEAD WO CONT      INDICATION: fall      COMPARISON: 12/01/2020.      CONTRAST: None.      TECHNIQUE: Unenhanced CT of the head was performed using 5 mm images. Brain and   bone windows were generated. Coronal and sagittal reformats. CT dose reduction   was achieved through use of a standardized protocol tailored for this   examination and automatic exposure control for dose modulation.      FINDINGS:   The ventricles and sulci are normal in size, shape and configuration.. Small   vessel ischemic changes are stable compared to the prior exam. There is no   intracranial hemorrhage, extra-axial collection, or mass effect. The basilar   cisterns are open. No CT evidence of acute infarct.      The bone windows demonstrate no abnormalities. The visualized portions of the   paranasal sinuses and mastoid air cells are clear. Left frontal soft tissue   swelling is noted.      Impression   Left frontal soft tissue swelling. No acute intracranial abnormality.         MRI Results (maximum last 3):   Results from Hospital Encounter encounter on 01/02/21      MRI [facility] SPINE WO CONT      Addendum 01/04/2021  1:13 PM   Addendum: I discussed critical findings with Osie Bond, MD at 1313 hours on   01/04/2021.      Narrative   STUDY: MRI cervical and thoracic spine without IV contrast      INDICATION:  falls      COMPARISON: CT cervical spine 01/02/2021       TECHNIQUE: Multiplanar, multisequence Noncontrast MRI of the cervical and   thoracic spine.      CONTRAST: None.      FINDINGS:      CERVICAL:      Stepwise retrolisthesis from C4-C6. Vertebral body heights are maintained.   Marrow signal is normal.  The craniocervical junction is intact. Multilevel  degenerative endplate osteophytes.      The course and signal intensity of the spinal cord are normal. Subpleural patchy   T2/or hyperintense foci in the pons may reflect chronic microangiopathic change.      C2-C3: Endplate osteophytes. Facet arthropathy. Disc bulge. Ligament flavum   thickening. Mild spinal stenosis. No foraminal stenosis.      C3-C4: Retrolisthesis. Endplate osteophytes. Ligamentum flavum thickening. Facet   arthropathy. Mild spinal stenosis. Severe right and mild left foraminal   stenosis.      C4-C5: Retrolisthesis. Endplate osteophytes. Disc bulge. Ligamentum flavum   thickening. Facet arthropathy. Uncovertebral hypertrophy. Moderate spinal   stenosis. Severe right and moderate left foraminal stenosis.      C5-C6: Disc bulge. Endplate osteophytes. Ligamentum flavum thickening. Facet   arthropathy. Uncovertebral hypertrophy, right worse than left. Mild spinal   stenosis. Severe right and mild left foraminal stenosis.      C6-C7: Disc bulge. Facet arthropathy. Uncovertebral hypertrophy. No spinal   stenosis. Mild right foraminal stenosis.      C7-T1: Facet arthropathy. No stenosis.         THORACIC:      There is normal alignment of the thoracic spine.      Subacute-appearing T10 severe biconcave compression fracture with at least 80%   height loss centrally and up to 7 mm osseous retropulsion. Moderate to severe   spinal cord compression with subtle increased T2/STIR signal at this level.      Chronic moderate T7 compression deformity.      Multilevel degenerative endplate osteophytes and facet arthropathy. Several   notable levels as follows:   T6-T7: central disc extrusion. Mild spinal  stenosis. No significant foraminal   stenosis.   T9-T10: Mild-to-moderate spinal canal stenosis. Mild right neuroforaminal   stenosis.   T10-T11: Mild spinal canal stenosis. Mild bilateral foraminal stenosis..         Several nodular signal abnormalities in the bilateral upper lobes (series 901,   images 50-52), patchy consolidation in the dependent right lower lobe (901-26),   and small right pleural effusion.      T2 hyperintense right renal lesion may reflect a cyst.      Impression   CERVICAL:   1.  No acute fracture nor subluxation.   2.  Multilevel degenerative changes, disc disease, listhesis contributing to   multilevel mild to moderate spinal canal stenoses and varying degrees of neural   foraminal stenosis as outlined above.      THORACIC:   1.  Subacute-appearing severe T10 compression fracture with osseous   retropulsion. 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 Associated T9-T11 Mild-to-moderate spinal   canal and neuroforaminal stenoses.   2.  T6-T7 Central disc extrusion with mild spinal stenosis at this level.   3.  Chronic T7 compression deformity.   4.  Scattered signal abnormalities in the bilateral lungs and a small right   pleural effusion may reflect focal pneumonia. Dedicated chest CT recommended for   further characterization.      A message for callback was left at the paging number for Osie Bond, MD at   1307 hours 01/04/2021.         MRI CERV SPINE WO CONT      Addendum 01/04/2021  1:13 PM   Addendum: I discussed critical findings with Osie Bond, MD at 1313 hours on   01/04/2021.      Narrative   STUDY: MRI cervical  and thoracic spine without IV contrast      INDICATION:  falls      COMPARISON: CT cervical spine 01/02/2021      TECHNIQUE: Multiplanar, multisequence Noncontrast MRI of the cervical and   thoracic spine.      CONTRAST: None.      FINDINGS:      CERVICAL:      Stepwise retrolisthesis from C4-C6. Vertebral  body heights are maintained.   Marrow signal is normal.  The craniocervical junction is intact. Multilevel   degenerative endplate osteophytes.      The course and signal intensity of the spinal cord are normal. Subpleural patchy   T2/or hyperintense foci in the pons may reflect chronic microangiopathic change.      C2-C3: Endplate osteophytes. Facet arthropathy. Disc bulge. Ligament flavum   thickening. Mild spinal stenosis. No foraminal stenosis.      C3-C4: Retrolisthesis. Endplate osteophytes. Ligamentum flavum thickening. Facet   arthropathy. Mild spinal stenosis. Severe right and mild left foraminal   stenosis.      C4-C5: Retrolisthesis. Endplate osteophytes. Disc bulge. Ligamentum flavum   thickening. Facet arthropathy. Uncovertebral hypertrophy. Moderate spinal   stenosis. Severe right and moderate left foraminal stenosis.      C5-C6: Disc bulge. Endplate osteophytes. Ligamentum flavum thickening. Facet   arthropathy. Uncovertebral hypertrophy, right worse than left. Mild spinal   stenosis. Severe right and mild left foraminal stenosis.      C6-C7: Disc bulge. Facet arthropathy. Uncovertebral hypertrophy. No spinal   stenosis. Mild right foraminal stenosis.      C7-T1: Facet arthropathy. No stenosis.         THORACIC:      There is normal alignment of the thoracic spine.      Subacute-appearing T10 severe biconcave compression fracture with at least 80%   height loss centrally and up to 7 mm osseous retropulsion. Moderate to severe   spinal cord compression with subtle increased T2/STIR signal at this level.      Chronic moderate T7 compression deformity.      Multilevel degenerative endplate osteophytes and facet arthropathy. Several   notable levels as follows:   T6-T7: central disc extrusion. Mild spinal stenosis. No significant foraminal   stenosis.   T9-T10: Mild-to-moderate spinal canal stenosis. Mild right neuroforaminal   stenosis.   T10-T11: Mild spinal canal stenosis. Mild bilateral foraminal  stenosis..         Several nodular signal abnormalities in the bilateral upper lobes (series 901,   images 50-52), patchy consolidation in the dependent right lower lobe (901-26),   and small right pleural effusion.      T2 hyperintense right renal lesion may reflect a cyst.      Impression   CERVICAL:   1.  No acute fracture nor subluxation.   2.  Multilevel degenerative changes, disc disease, listhesis contributing to   multilevel mild to moderate spinal canal stenoses and varying degrees of neural   foraminal stenosis as outlined above.      THORACIC:   1.  Subacute-appearing severe T10 compression fracture with osseous   retropulsion. 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 Associated T9-T11 Mild-to-moderate spinal   canal and neuroforaminal stenoses.   2.  T6-T7 Central disc extrusion with mild spinal stenosis at this level.   3.  Chronic T7 compression deformity.   4.  Scattered signal abnormalities in the bilateral lungs and a small right   pleural  effusion may reflect focal pneumonia. Dedicated chest CT recommended for   further characterization.      A message for callback was left at the paging number for Osie BondKhan, Bushra, MD at   1307 hours 01/04/2021.         Nuclear Medicine Results (maximum last 3):   Results from Hospital Encounter encounter on 01/02/21      NM INFLAM PROC WH BODY      Narrative   EXAM: Whole Body Nuclear WBC Scan is performed with IV injection of autologous   WBCs labeled with 25 mCi technetium [id number] Ceretec.      INDICATION: Unexplained sepsis      Comparison Scan: None.   Correlation Imaging Studies: CT Chest. MRI C and T-spine.      TECHNIQUE: Anterior and posterior whole body scintigraphic planar images are   obtained as well as oblique views of the thorax.      FINDINGS:   There is physiologic distribution of labeled WBCs.   There is no apparent abscess/inflammation.      Impression   Unremarkable Whole Body WBC  Scan.         US Results (maximum last 3):   No results found for this or any previous visit.         Active Problems:     Hip fracture (HCC) (01/25/2021)              Assessment/Plan:        1.  Left hip fracture   - X-ray left hip shows displaced femoral neck fx   - IV morphine and Roxicodone PRN pain   - NPO   - Orthopedic surgery consult      2.  COPD   - Substitute Trelegy ellipta inh inpatient   - Stable       3.  Hyperlipidemia    - Continue atorvastatin      4.  Insomnia       DVT Prophylaxis: Lovenox    GI Prophylaxis: Protonix   Code Status: Full   POA:         Total Time >55 minutes            Signed By:  Carmon SailsKendall Kyiah Canepa, PA-C           January 25, 2021

## 2021-01-26 ENCOUNTER — Inpatient Hospital Stay: Admit: 2021-01-27 | Payer: MEDICARE | Primary: Internal Medicine

## 2021-01-26 ENCOUNTER — Inpatient Hospital Stay: Admit: 2021-01-26 | Payer: MEDICARE | Primary: Internal Medicine

## 2021-01-26 LAB — CBC W/O DIFF
ABSOLUTE NRBC: 0 10*3/uL (ref 0.00–0.01)
HCT: 35 % (ref 35.0–47.0)
HGB: 11.1 g/dL — ABNORMAL LOW (ref 11.5–16.0)
MCH: 30.7 pg (ref 26.0–34.0)
MCHC: 31.7 g/dL (ref 30.0–36.5)
MCV: 97 fL (ref 80.0–99.0)
MPV: 8.7 FL — ABNORMAL LOW (ref 8.9–12.9)
NRBC: 0 /100{WBCs}
PLATELET: 377 10*3/uL (ref 150–400)
RBC: 3.61 M/uL — ABNORMAL LOW (ref 3.80–5.20)
RDW: 12.4 % (ref 11.5–14.5)
WBC: 12.5 10*3/uL — ABNORMAL HIGH (ref 3.6–11.0)

## 2021-01-26 LAB — METABOLIC PANEL, BASIC
Anion gap: 9 mmol/L (ref 5–15)
BUN/Creatinine ratio: 28 — ABNORMAL HIGH (ref 12–20)
BUN: 20 mg/dL (ref 6–20)
CO2: 23 mmol/L (ref 21–32)
Calcium: 9 mg/dL (ref 8.5–10.1)
Chloride: 108 mmol/L (ref 97–108)
Creatinine: 0.71 mg/dL (ref 0.55–1.02)
Glucose: 87 mg/dL (ref 65–100)
Potassium: 4.1 mmol/L (ref 3.5–5.1)
Sodium: 140 mmol/L (ref 136–145)
eGFR: >60 mL/min/{1.73_m2} (ref >60)

## 2021-01-26 LAB — EKG, 12 LEAD, INITIAL
Atrial Rate: 97 {beats}/min
Calculated P Axis: 52 degrees
Calculated R Axis: 21 degrees
Calculated T Axis: 48 degrees
Diagnosis: NORMAL
P-R Interval: 190 ms
Q-T Interval: 346 ms
QRS Duration: 60 ms
QTC Calculation (Bezet): 439 ms
Ventricular Rate: 97 {beats}/min

## 2021-01-26 LAB — URINALYSIS W/MICROSCOPIC
Bacteria: NEGATIVE /[HPF]
Bilirubin: NEGATIVE
Glucose: NEGATIVE mg/dL
Ketone: 5 mg/dL — AB
Nitrites: NEGATIVE
Protein: NEGATIVE mg/dL
Specific gravity: 1.019 (ref 1.003–1.030)
Urobilinogen: 0.1 U/dL (ref 0.1–1.0)
pH (UA): 6 (ref 5.0–8.0)

## 2021-01-26 LAB — URINALYSIS WITH MICROSCOPIC
BACTERIA, URINE: NEGATIVE /hpf
Bilirubin, Urine: NEGATIVE
Glucose, Ur: NEGATIVE mg/dL
Ketones, Urine: 5 mg/dL — AB
Nitrite, Urine: NEGATIVE
Protein, UA: NEGATIVE mg/dL
Specific Gravity, UA: 1.019 (ref 1.003–1.030)
Urobilinogen, UA, POCT: 0.1 EU/dL (ref 0.1–1.0)
pH, UA: 6 (ref 5.0–8.0)

## 2021-01-26 LAB — BASIC METABOLIC PANEL
Anion Gap: 9 mmol/L (ref 5–15)
BUN: 20 mg/dL (ref 6–20)
Bun/Cre Ratio: 28 — ABNORMAL HIGH (ref 12–20)
CO2: 23 mmol/L (ref 21–32)
Calcium: 9 mg/dL (ref 8.5–10.1)
Chloride: 108 mmol/L (ref 97–108)
Creatinine: 0.71 mg/dL (ref 0.55–1.02)
ESTIMATED GLOMERULAR FILTRATION RATE: >60 mL/min/{1.73_m2} (ref >60)
Glucose: 87 mg/dL (ref 65–100)
Potassium: 4.1 mmol/L (ref 3.5–5.1)
Sodium: 140 mmol/L (ref 136–145)

## 2021-01-26 LAB — CBC
Hematocrit: 35 % (ref 35.0–47.0)
Hemoglobin: 11.1 g/dL — ABNORMAL LOW (ref 11.5–16.0)
MCH: 30.7 PG (ref 26.0–34.0)
MCHC: 31.7 g/dL (ref 30.0–36.5)
MCV: 97 FL (ref 80.0–99.0)
MPV: 8.7 FL — ABNORMAL LOW (ref 8.9–12.9)
NRBC Absolute: 0 10*3/uL (ref 0.00–0.01)
Nucleated RBCs: 0 PER 100 WBC
Platelets: 377 10*3/uL (ref 150–400)
RBC: 3.61 M/uL — ABNORMAL LOW (ref 3.80–5.20)
RDW: 12.4 % (ref 11.5–14.5)
WBC: 12.5 10*3/uL — ABNORMAL HIGH (ref 3.6–11.0)

## 2021-01-26 LAB — EKG 12-LEAD
Atrial Rate: 97 {beats}/min
Diagnosis: NORMAL
P Axis: 52 degrees
P-R Interval: 190 ms
Q-T Interval: 346 ms
QRS Duration: 60 ms
QTc Calculation (Bazett): 439 ms
R Axis: 21 degrees
T Axis: 48 degrees
Ventricular Rate: 97 {beats}/min

## 2021-01-26 MED ORDER — METOPROLOL SUCCINATE SR 25 MG 24 HR TAB
25 mg | Freq: Every day | ORAL | Status: DC
Start: 2021-01-26 — End: 2021-01-28
  Administered 2021-01-26 – 2021-01-28 (×3): via ORAL

## 2021-01-26 MED ORDER — LACTATED RINGERS IV
INTRAVENOUS | Status: DC | PRN
Start: 2021-01-26 — End: 2021-01-26
  Administered 2021-01-26: 22:00:00 via INTRAVENOUS

## 2021-01-26 MED ORDER — PHENYLEPHRINE 1 MG/10 ML (100 MCG/ML) IN NS IV SYRINGE
1 mg/0 mL (00 mcg/mL) | INTRAVENOUS | Status: AC
Start: 2021-01-26 — End: ?

## 2021-01-26 MED ORDER — DEXAMETHASONE SODIUM PHOSPHATE 4 MG/ML IJ SOLN
4 mg/mL | INTRAMUSCULAR | Status: AC
Start: 2021-01-26 — End: ?

## 2021-01-26 MED ORDER — BUDESONIDE-FORMOTEROL HFA 160 MCG-4.5 MCG/ACTUATION AEROSOL INHALER
Freq: Two times a day (BID) | RESPIRATORY_TRACT | Status: AC
Start: 2021-01-26 — End: 2021-01-28
  Administered 2021-01-26 – 2021-01-28 (×4): via RESPIRATORY_TRACT

## 2021-01-26 MED ORDER — HYDROXYZINE PAMOATE 25 MG CAP
25 mg | Freq: Three times a day (TID) | ORAL | Status: DC | PRN
Start: 2021-01-26 — End: 2021-01-28

## 2021-01-26 MED ORDER — ATORVASTATIN 40 MG TAB
40 mg | Freq: Every day | ORAL | Status: DC
Start: 2021-01-26 — End: 2021-01-28
  Administered 2021-01-26 – 2021-01-28 (×3): via ORAL

## 2021-01-26 MED ORDER — FENTANYL CITRATE (PF) 50 MCG/ML IJ SOLN
50 mcg/mL | INTRAMUSCULAR | Status: DC | PRN
Start: 2021-01-26 — End: 2021-01-26
  Administered 2021-01-26 (×3): via INTRAVENOUS

## 2021-01-26 MED ORDER — METOPROLOL TARTRATE 5 MG/5 ML IV SOLN
5 mg/ mL | INTRAVENOUS | Status: DC | PRN
Start: 2021-01-26 — End: 2021-01-26
  Administered 2021-01-26: via INTRAVENOUS

## 2021-01-26 MED ORDER — ROCURONIUM 10 MG/ML IV
10 mg/mL | INTRAVENOUS | Status: AC
Start: 2021-01-26 — End: ?

## 2021-01-26 MED ORDER — SODIUM CHLORIDE 0.9 % IV PIGGY BACK
1000 mg/10 mL (100 mg/mL) | Freq: Once | INTRAVENOUS | Status: AC
Start: 2021-01-26 — End: 2021-01-27

## 2021-01-26 MED ORDER — SUCCINYLCHOLINE CHLORIDE 200 MG/10 ML (20 MG/ML) INTRAVENOUS SYRINGE
200 mg/10 mL (20 mg/mL) | INTRAVENOUS | Status: AC
Start: 2021-01-26 — End: ?

## 2021-01-26 MED ORDER — ALBUTEROL SULFATE HFA 90 MCG/ACTUATION AEROSOL INHALER
90 mcg/actuation | Freq: Four times a day (QID) | RESPIRATORY_TRACT | Status: DC | PRN
Start: 2021-01-26 — End: 2021-01-28

## 2021-01-26 MED ORDER — SODIUM CHLORIDE 0.9 % IV PIGGY BACK
1000 mg/10 mL (100 mg/mL) | INTRAVENOUS | Status: DC | PRN
Start: 2021-01-26 — End: 2021-01-26
  Administered 2021-01-26 (×2): via INTRAVENOUS

## 2021-01-26 MED ORDER — SODIUM CHLORIDE 0.9 % IV
1000 mg/10 mL (100 mg/mL) | Freq: Once | INTRAVENOUS | Status: DC
Start: 2021-01-26 — End: 2021-01-26
  Administered 2021-01-26: 21:00:00 via INTRAVENOUS

## 2021-01-26 MED ORDER — CEFAZOLIN 1 GRAM SOLUTION FOR INJECTION
1 gram | INTRAMUSCULAR | Status: AC
Start: 2021-01-26 — End: ?

## 2021-01-26 MED ORDER — ONDANSETRON (PF) 4 MG/2 ML INJECTION
4 mg/2 mL | INTRAMUSCULAR | Status: AC
Start: 2021-01-26 — End: ?

## 2021-01-26 MED ORDER — PROPOFOL 10 MG/ML IV EMUL
10 mg/mL | INTRAVENOUS | Status: DC | PRN
Start: 2021-01-26 — End: 2021-01-26
  Administered 2021-01-26: 22:00:00 via INTRAVENOUS

## 2021-01-26 MED ORDER — METOPROLOL TARTRATE 5 MG/5 ML IV SOLN
5 mg/ mL | INTRAVENOUS | Status: AC
Start: 2021-01-26 — End: ?

## 2021-01-26 MED ORDER — DEXMEDETOMIDINE 100 MCG/ML IV SOLN
100 mcg/mL | INTRAVENOUS | Status: DC | PRN
Start: 2021-01-26 — End: 2021-01-26
  Administered 2021-01-26: via INTRAVENOUS

## 2021-01-26 MED ORDER — FENTANYL CITRATE (PF) 50 MCG/ML IJ SOLN
50 mcg/mL | INTRAMUSCULAR | Status: AC
Start: 2021-01-26 — End: ?

## 2021-01-26 MED ORDER — DEXAMETHASONE SODIUM PHOSPHATE 4 MG/ML IJ SOLN
4 mg/mL | INTRAMUSCULAR | Status: DC | PRN
Start: 2021-01-26 — End: 2021-01-26
  Administered 2021-01-26: 22:00:00 via INTRAVENOUS

## 2021-01-26 MED ORDER — VANCOMYCIN 1,000 MG IV SOLR
1000 mg | INTRAVENOUS | Status: AC
Start: 2021-01-26 — End: ?

## 2021-01-26 MED ORDER — ROCURONIUM 10 MG/ML IV
10 mg/mL | INTRAVENOUS | Status: DC | PRN
Start: 2021-01-26 — End: 2021-01-26
  Administered 2021-01-26 (×3): via INTRAVENOUS

## 2021-01-26 MED ORDER — PROPOFOL 10 MG/ML IV EMUL
10 mg/mL | INTRAVENOUS | Status: AC
Start: 2021-01-26 — End: ?

## 2021-01-26 MED ORDER — TRANEXAMIC ACID 1,000 MG/10 ML (100 MG/ML) IV
1000 mg/10 mL (100 mg/mL) | INTRAVENOUS | Status: AC
Start: 2021-01-26 — End: ?

## 2021-01-26 MED ORDER — PHENYLEPHRINE 10 MG/ML INJECTION
10 mg/mL | INTRAMUSCULAR | Status: DC | PRN
Start: 2021-01-26 — End: 2021-01-26
  Administered 2021-01-26 (×4): via INTRAVENOUS

## 2021-01-26 MED ORDER — BUPIVACAINE 0.5 % (5 MG/ML) IJ SOLN
0.5 % (5 mg/mL) | INTRAMUSCULAR | Status: AC
Start: 2021-01-26 — End: ?

## 2021-01-26 MED ORDER — PANTOPRAZOLE 40 MG TAB, DELAYED RELEASE
40 mg | Freq: Every day | ORAL | Status: AC
Start: 2021-01-26 — End: 2021-01-28
  Administered 2021-01-26 – 2021-01-28 (×3): via ORAL

## 2021-01-26 MED ORDER — TIOTROPIUM BROMIDE 2.5 MCG/ACTUATION MIST FOR INHALATION
2.5 mcg/actuation | Freq: Every day | RESPIRATORY_TRACT | Status: DC
Start: 2021-01-26 — End: 2021-01-28
  Administered 2021-01-26 – 2021-01-28 (×4): via RESPIRATORY_TRACT

## 2021-01-26 MED ORDER — ONDANSETRON (PF) 4 MG/2 ML INJECTION
4 mg/2 mL | INTRAMUSCULAR | Status: DC | PRN
Start: 2021-01-26 — End: 2021-01-26
  Administered 2021-01-26: 22:00:00 via INTRAVENOUS

## 2021-01-26 MED ORDER — CEFAZOLIN 1 GRAM SOLUTION FOR INJECTION
1 gram | INTRAMUSCULAR | Status: DC | PRN
Start: 2021-01-26 — End: 2021-01-26
  Administered 2021-01-26: 22:00:00 via INTRAVENOUS

## 2021-01-26 MED ORDER — VANCOMYCIN 5 GRAM IV SOLR
5 gram | INTRAVENOUS | Status: DC | PRN
Start: 2021-01-26 — End: 2021-01-26
  Administered 2021-01-26

## 2021-01-26 MED FILL — PROPOFOL 10 MG/ML IV EMUL: 10 mg/mL | INTRAVENOUS | Qty: 20

## 2021-01-26 MED FILL — ONDANSETRON (PF) 4 MG/2 ML INJECTION: 4 mg/2 mL | INTRAMUSCULAR | Qty: 2

## 2021-01-26 MED FILL — SUCCINYLCHOLINE CHLORIDE 200 MG/10 ML (20 MG/ML) INTRAVENOUS SYRINGE: 200 mg/10 mL (20 mg/mL) | INTRAVENOUS | Qty: 10

## 2021-01-26 MED FILL — VANCOMYCIN 1,000 MG IV SOLR: 1000 mg | INTRAVENOUS | Qty: 2000

## 2021-01-26 MED FILL — DEXAMETHASONE SODIUM PHOSPHATE 4 MG/ML IJ SOLN: 4 mg/mL | INTRAMUSCULAR | Qty: 1

## 2021-01-26 MED FILL — OXYCODONE 5 MG TAB: 5 mg | ORAL | Qty: 1

## 2021-01-26 MED FILL — FENTANYL CITRATE (PF) 50 MCG/ML IJ SOLN: 50 mcg/mL | INTRAMUSCULAR | Qty: 2

## 2021-01-26 MED FILL — METOPROLOL SUCCINATE SR 25 MG 24 HR TAB: 25 mg | ORAL | Qty: 1

## 2021-01-26 MED FILL — MORPHINE 2 MG/ML INJECTION: 2 mg/mL | INTRAMUSCULAR | Qty: 1

## 2021-01-26 MED FILL — MELATONIN 3 MG TAB: 3 mg | ORAL | Qty: 3

## 2021-01-26 MED FILL — MARCAINE 0.5 % (5 MG/ML) INJECTION SOLUTION: 0.5 % (5 mg/mL) | INTRAMUSCULAR | Qty: 50

## 2021-01-26 MED FILL — CEFAZOLIN 1 GRAM SOLUTION FOR INJECTION: 1 gram | INTRAMUSCULAR | Qty: 2000

## 2021-01-26 MED FILL — ROCURONIUM 10 MG/ML IV: 10 mg/mL | INTRAVENOUS | Qty: 5

## 2021-01-26 MED FILL — PHENYLEPHRINE 1 MG/10 ML (100 MCG/ML) IN NS IV SYRINGE: 1 mg/0 mL (00 mcg/mL) | INTRAVENOUS | Qty: 10

## 2021-01-26 MED FILL — TYLENOL 325 MG TABLET: 325 mg | ORAL | Qty: 2

## 2021-01-26 MED FILL — SPIRIVA RESPIMAT 2.5 MCG/ACTUATION SOLUTION FOR INHALATION: 2.5 mcg/actuation | RESPIRATORY_TRACT | Qty: 4

## 2021-01-26 MED FILL — PANTOPRAZOLE 40 MG TAB, DELAYED RELEASE: 40 mg | ORAL | Qty: 1

## 2021-01-26 MED FILL — TRANEXAMIC ACID 1,000 MG/10 ML (100 MG/ML) IV: 1000 mg/10 mL (100 mg/mL) | INTRAVENOUS | Qty: 10

## 2021-01-26 MED FILL — METOPROLOL TARTRATE 5 MG/5 ML IV SOLN: 5 mg/ mL | INTRAVENOUS | Qty: 5

## 2021-01-26 MED FILL — ATORVASTATIN 40 MG TAB: 40 mg | ORAL | Qty: 1

## 2021-01-26 MED FILL — BUDESONIDE-FORMOTEROL HFA 160 MCG-4.5 MCG/ACTUATION AEROSOL INHALER: RESPIRATORY_TRACT | Qty: 10.2

## 2021-01-26 MED FILL — TRANEXAMIC ACID 1,000 MG/10 ML (100 MG/ML) IV: 1000 mg/10 mL (100 mg/mL) | INTRAVENOUS | Qty: 20

## 2021-01-26 NOTE — Anesthesia Pre-Procedure Evaluation (Signed)
Relevant Problems   No relevant active problems       Anesthetic History   No history of anesthetic complications            Review of Systems / Medical History  Patient summary reviewed, nursing notes reviewed and pertinent labs reviewed    Pulmonary    COPD            Comments: COVID +   Neuro/Psych   Within defined limits           Cardiovascular      Valvular problems/murmurs: mitral insufficiency and aortic insufficiency        Hyperlipidemia      Comments: Echo:  ???  Left??Ventricle: Low normal left ventricular systolic function with a visually estimated EF of 50 - 55%. Left ventricle is smaller than normal. Mild septal thickening. Mild posterior thickening. See diagram for wall motion findings. Grade I diastolic dysfunction with normal LAP.  ???  Right??Ventricle: Reduced systolic function.  ???  Aortic??Valve: Tricuspid valve. Mild sclerosis of the aortic valve cusp. Mild regurgitation.  ???  Mitral??Valve: Valve structure is normal. Mild annular calcification at the posterior leaflet of the mitral valve. Mild to moderate paravalvular regurgitation.  ???  Right??Atrium: Right atrium is mildly dilated.  ???  Technical qualifiers: Echo study was technically difficult due to patient's body habitus.   GI/Hepatic/Renal     GERD           Endo/Other        Anemia     Other Findings            Past Medical History:   Diagnosis Date   ??? Chronic obstructive pulmonary disease (HCC)    ??? Hypercholesteremia    ??? Insomnia    ??? Sleep disorder        No past surgical history on file.    Current Outpatient Medications   Medication Instructions   ??? acetaminophen (TYLENOL) 325 mg, Oral, EVERY 4 HOURS AS NEEDED   ??? albuterol (PROVENTIL HFA, VENTOLIN HFA, PROAIR HFA) 90 mcg/actuation inhaler albuterol sulfate HFA 90 mcg/actuation aerosol inhaler   INHALE 2 PUFFS EVERY 6 HOURS AS NEEDED   ??? amitriptyline (ELAVIL) 75 mg, Oral, EVERY BEDTIME   ??? atorvastatin (LIPITOR) 40 mg, Oral, DAILY   ??? fluticasone-umeclidin-vilanter (TRELEGY ELLIPTA)  200-62.5-25 mcg inhaler Trelegy Ellipta 200 mcg-62.5 mcg-25 mcg powder for inhalation   INHALE 1 PUFF BY MOUTH ONCE A DAY   ??? metoprolol succinate (TOPROL-XL) 25 mg, Oral, DAILY   ??? omeprazole (PRILOSEC) 20 mg, Oral, DAILY       Current Facility-Administered Medications   Medication Dose Route Frequency   ??? albuterol (PROVENTIL HFA, VENTOLIN HFA, PROAIR HFA) inhaler 2 Puff  2 Puff Inhalation Q6H PRN   ??? atorvastatin (LIPITOR) tablet 40 mg  40 mg Oral DAILY   ??? budesonide-formoteroL (SYMBICORT) 160-4.5 mcg/actuation HFA inhaler 2 Puff  2 Puff Inhalation BID RT    And   ??? tiotropium bromide (SPIRIVA RESPIMAT) 2.5 mcg /actuation  2 Puff Inhalation DAILY   ??? metoprolol succinate (TOPROL-XL) XL tablet 25 mg  25 mg Oral DAILY   ??? pantoprazole (PROTONIX) tablet 40 mg  40 mg Oral ACB   ??? hydrOXYzine pamoate (VISTARIL) capsule 25 mg  25 mg Oral TID PRN   ??? 0.9% sodium chloride infusion  75 mL/hr IntraVENous CONTINUOUS   ??? sodium chloride (NS) flush 5-40 mL  5-40 mL IntraVENous Q8H   ??? sodium chloride (NS) flush  5-40 mL  5-40 mL IntraVENous PRN   ??? naloxone (NARCAN) injection 0.4 mg  0.4 mg IntraVENous PRN   ??? acetaminophen (TYLENOL) tablet 650 mg  650 mg Oral Q6H PRN    Or   ??? acetaminophen (TYLENOL) suppository 650 mg  650 mg Rectal Q6H PRN   ??? polyethylene glycol (MIRALAX) packet 17 g  17 g Oral DAILY PRN   ??? ondansetron (ZOFRAN ODT) tablet 4 mg  4 mg Oral Q8H PRN    Or   ??? ondansetron (ZOFRAN) injection 4 mg  4 mg IntraVENous Q6H PRN   ??? enoxaparin (LOVENOX) injection 30 mg  30 mg SubCUTAneous DAILY   ??? oxyCODONE IR (ROXICODONE) tablet 5 mg  5 mg Oral Q4H PRN   ??? hydrALAZINE (APRESOLINE) 20 mg/mL injection 10 mg  10 mg IntraVENous Q6H PRN   ??? melatonin tablet 9 mg  9 mg Oral QHS PRN   ??? morphine injection 1 mg  1 mg IntraVENous Q4H PRN       Patient Vitals for the past 24 hrs:   Pulse Resp BP SpO2   01/26/21 1409 -- 18 95/70 95 %   01/26/21 0847 91 -- 118/79 (P) 97 %   01/26/21 0544 67 -- 115/80 94 %   01/26/21 0300 74 --  126/80 94 %   01/26/21 0130 76 -- 130/82 96 %   01/26/21 0000 86 -- 132/87 95 %   01/25/21 1945 100 18 (!) 139/92 94 %       Lab Results   Component Value Date/Time    WBC 12.5 (H) 01/26/2021 04:30 AM    HGB 11.1 (L) 01/26/2021 04:30 AM    HCT 35.0 01/26/2021 04:30 AM    PLATELET 377 01/26/2021 04:30 AM    MCV 97.0 01/26/2021 04:30 AM     Lab Results   Component Value Date/Time    Sodium 140 01/26/2021 04:30 AM    Potassium 4.1 01/26/2021 04:30 AM    Chloride 108 01/26/2021 04:30 AM    CO2 23 01/26/2021 04:30 AM    Anion gap 9 01/26/2021 04:30 AM    Glucose 87 01/26/2021 04:30 AM    BUN 20 01/26/2021 04:30 AM    Creatinine 0.71 01/26/2021 04:30 AM    BUN/Creatinine ratio 28 (H) 01/26/2021 04:30 AM    GFR est AA 41 (L) 03/20/2020 08:16 AM    GFR est non-AA 34 (L) 03/20/2020 08:16 AM    Calcium 9.0 01/26/2021 04:30 AM     Lab Results   Component Value Date/Time    PTP 13.7 01/25/2021 04:08 PM    INR 1.0 01/25/2021 04:08 PM     Lab Results   Component Value Date/Time    Glucose 87 01/26/2021 04:30 AM     Physical Exam    Airway  Mallampati: II  TM Distance: 4 - 6 cm  Neck ROM: normal range of motion   Mouth opening: Normal     Cardiovascular    Rhythm: regular  Rate: normal         Dental    Dentition: Poor dentition     Pulmonary  Breath sounds clear to auscultation               Abdominal  GI exam deferred       Other Findings            Anesthetic Plan    ASA: 3, emergent  Anesthesia type: general    Monitoring Plan: Continuous noninvasive  hemodynamic monitoring      Induction: Intravenous  Anesthetic plan and risks discussed with: Patient and Family

## 2021-01-26 NOTE — Progress Notes (Signed)
Called and left voicemal Gaylyn Rong ( ph: [phone]) to  call me back if they need update the surgical plan and high risk nature of surgery .

## 2021-01-26 NOTE — ED Notes (Signed)
Received report from Darel, Rn, assuming care of pt at this time

## 2021-01-26 NOTE — ED Notes (Signed)
Rounded on pt, pt is resting comfortably on stretcher, call bell in reach, on pulse ox monitoring, no request at this time

## 2021-01-26 NOTE — Op Note (Signed)
Operative Note    Patient: [name]  Date of Birth: 1945/11/01  MRN: [id number]    Date of Procedure: 01/26/2021     Pre-Op Diagnosis: Fracture left femoral neck     Post-Op Diagnosis: Same as preoperative diagnosis.      Procedure(s):  Left Hip Hemi-Arthroplasty, Anterior Approach    Surgeon(s):  Riva Sesma, Tonie Griffith, MD    Surgical Assistant: Surg Asst-1: Stacy Gardner    Anesthesia: General     Estimated Blood Loss (mL):  500    Complications: None    Specimens: * No specimens in log *     Implants:   Implant Name Type Inv. Item Serial No. Manufacturer Lot No. LRB No. Used Action   HEAD BPLR OD45MM [id number] FEM HIP SELF CNTR - SNA  HEAD BPLR OD45MM [id number] FEM HIP SELF CNTR NA [employer]WD [id number] Left 1 Implanted   STEM FEM SZ 2 STD OFFSET HIP CLLRD ARTC EZ 12/14 TAPR ACTIS - SNA  STEM FEM SZ 2 STD OFFSET HIP CLLRD ARTC EZ 12/14 TAPR ACTIS NA [employer]WD [id number] Left 1 Implanted   HEAD FEM DIA28MM NK L+1.5MM HIP MTL ON MTL 12/14 TAPR - SNA  HEAD FEM DIA28MM NK L+1.5MM HIP MTL ON MTL 12/14 TAPR NA [employer]WD [id number] Left 1 Implanted       Drains: * No LDAs found *    Findings: narrow femoral canal   Osteoporosis     Detailed Description of Procedure:   76 year old female admitted secondary to ground-level fall sustained a left femoral neck fracture.  Scheduled for elective hemiarthroplasty today.  Based on the preoperative planning options for cemented system and the Actis stem are available.      Patient was verified in the patient's room and directly brought to the operating room according to the protocol for COVID-positive patients.  She was then placed on the Hana table.  Left lower extremity was sterilely prepped and draped in standard fashion.  Surgical pause was conducted surgery was then commenced by making a 3 cm incision centered along the T FL.  This was then extended proximally and distally.  Hibbs and Meyerding retractors were  used to identify the plane between the rectus and the T FL.  The electrocautery and aqua mantis was used to take down the bleeders.  Once this was accomplished TP cobra was placed along the lateral aspect of the femoral neck.  Femoral neck capsulotomy was then performed and tagged for future closure.  The femoral neck fracture was evident.  Revision femoral neck cut was then made after releasing the capsule along the lesser trochanter.  Femoral head was then taken out atraumatically using a towel clip.  Once this was accomplished measuring of the size revealed a size 45.  Wound was then thoroughly irrigated.  Bone surfaces were evaluated.  Faint patient had a severe left narrow femoral canal.  Based on this I chose to proceed with a at this time since it was narrower than the cemented C stem that was available as alternative option.  Careful broaching of the femur was then performed.  A size 2 seemed appropriate for the patient in terms of a good press-fit.  Trialing with a size 2+45+1.5 inner head revealed excellent stability as well as appropriate leg lengths.  Stability was tested in both in extension external rotation as well as in flexion external rotation and internal rotation.  Wound was thoroughly irrigated at this time.  All the trial implants were then taken out and the final size 2 Actis stem and the 45 mm outer head along with a 28+ pound 1.5 head was then placed in position and then final reduction accomplished.  Repeat assessment revealed excellent stability.  Fluoroscopy revealed appropriate size of the implants as well as leg lengths.  Wound was then thoroughly irrigated and 6 sequentially closed with #1 Vicryl for the capsulotomy followed by #2 FiberWire and a #1 Vicryl for the T FL fascia and #1 Vicryl for the deep tissue 2-0 Vicryl for sub cutaneous  tissue and staples for the skin.  Pico dressing was then placed on the wound.    Postop instructions:  -Weight-bear as tolerated left lower  extremity  -Lovenox for DVT prophylaxis      Electronically Signed by Bebe Shaggy, MD on 01/26/2021 at 6:52 PM

## 2021-01-26 NOTE — ED Notes (Signed)
Pt being transport at this time to [facility]

## 2021-01-26 NOTE — Progress Notes (Signed)
Reason for Readmission:     Fx Hip         RUR Score/Risk Level:   13%      PCP: First and Last name:  Mohiuddin, Glade Lloyd    Name of Practice:    Are you a current patient: Yes/No: Yes   Approximate date of last visit: Seen @ the facility.    Can you participate in a virtual visit with your PCP: Yes/Call/Has cell phone.     Is a Care Conference indicated:  No      Did you attend your follow up appointment (s):  If not, why not: Yes, saw MD @ the facility.          Resources/supports as identified by patient/family:   Placement       Top Challenges facing patient (as identified by patient/family and CM):         Finances/Medication cost?    No   Transportation    No    Support system or lack thereof?  No     Living arrangements?   No        Self-care/ADLs/Cognition?  Yes, needs assist with ADLs d/t decreased mobility.           Current Advanced Directive/Advance Care Plan:  Full Code           Plan for utilizing home health:   None. Pt signed Choice Letter to return to [facility]. Referral sent via [facility]. Uses walker/w/c.              Transition of Care Plan:    Based on readmission, the patient's previous Plan of Care   has been evaluated and/or modified. The current Transition of Care Plan is:         D/C Plan is to return to [facility] via transportation upon discharge. Per pt she has informed son.

## 2021-01-26 NOTE — ACP (Advance Care Planning) (Signed)
Advance Care Planning  Healthcare Decision Maker:       Primary Decision Maker: [name] [phone]

## 2021-01-26 NOTE — ED Notes (Signed)
Spoke with OR, pt to have surgery at 5pm

## 2021-01-26 NOTE — Progress Notes (Signed)
 Patients IV was infiltrated after returning to floor from surgery. Attempted to start new line, patient jerked arm as I was sticking her and IV came straight out. Patient is refusing to be stuck again. I educated patient on importance of having IV access for post-op antibiotics and IV fluids. Patient educated on post-op infection risk by not getting antibiotics. Patient stated I dont care if I get an infection, you're not sticking me again.

## 2021-01-26 NOTE — Progress Notes (Signed)
Pt. Combative and irritable during pacu stay.  Pt. Swinging/swatting at nurse stating, "leave me alone".  Pt. C/o nausea at one point, given emesis bag, attempted to give alcohol wipe to smell, pt. Refused.  No c/o pain.  Refused TED hose at this time, taken to room with pt. XRAY staff and Dr. Eather Colas xray will be done in pt.'s room.

## 2021-01-26 NOTE — Progress Notes (Signed)
Dr. Corky Downs updated pt.'s family via phone.

## 2021-01-26 NOTE — Progress Notes (Signed)
Progress Notes by Alain Honey, PA at 01/26/21 7425                Author: Alain Honey, PA  Service: Internal Medicine  Author Type: Physician Assistant       Filed: 01/26/21 0857  Date of Service: 01/26/21 0723  Status: Signed           Editor: Alain Honey, PA (Physician Assistant)  Cosigner: Ludwig Clarks, MD at 01/27/21 1610                     Hospitalist Progress Note        Subjective:     Daily Progress Note: 01/26/2021 7:23 AM      Patient denies any shortness of breath, wheezing, cough, or dependence on home oxygen.  Patient does not see a lung doctor outpatient.  Patient denies any history of blood clots, complications of anesthesia, or known heart conditions.  Patient denies  dyspnea on exertion.  Patient pain controlled with pain medication.        Current Facility-Administered Medications          Medication  Dose  Route  Frequency           ?  0.9% sodium chloride infusion   75 mL/hr  IntraVENous  CONTINUOUS     ?  sodium chloride (NS) flush 5-40 mL   5-40 mL  IntraVENous  Q8H     ?  sodium chloride (NS) flush 5-40 mL   5-40 mL  IntraVENous  PRN     ?  naloxone (NARCAN) injection 0.4 mg   0.4 mg  IntraVENous  PRN     ?  acetaminophen (TYLENOL) tablet 650 mg   650 mg  Oral  Q6H PRN          Or           ?  acetaminophen (TYLENOL) suppository 650 mg   650 mg  Rectal  Q6H PRN     ?  polyethylene glycol (MIRALAX) packet 17 g   17 g  Oral  DAILY PRN     ?  ondansetron (ZOFRAN ODT) tablet 4 mg   4 mg  Oral  Q8H PRN          Or           ?  ondansetron (ZOFRAN) injection 4 mg   4 mg  IntraVENous  Q6H PRN     ?  enoxaparin (LOVENOX) injection 30 mg   30 mg  SubCUTAneous  DAILY           ?  oxyCODONE IR (ROXICODONE) tablet 5 mg   5 mg  Oral  Q4H PRN           ?  hydrALAZINE (APRESOLINE) 20 mg/mL injection 10 mg   10 mg  IntraVENous  Q6H PRN     ?  melatonin tablet 9 mg   9 mg  Oral  QHS PRN           ?  morphine injection 1 mg   1 mg  IntraVENous  Q4H PRN          Current  Outpatient Medications        Medication  Sig         ?  amitriptyline (ELAVIL) 75 mg tablet  Take 1 Tablet by mouth nightly for 30 days.     ?  acetaminophen (TYLENOL) 325 mg tablet  Take 325 mg by mouth every four (4) hours as needed for Pain (Take 2 tablets by mouth as needed for pain).     ?  atorvastatin (LIPITOR) 40 mg tablet  Take 40 mg by mouth daily.     ?  metoprolol succinate (TOPROL-XL) 25 mg XL tablet  Take 25 mg by mouth daily.     ?  fluticasone-umeclidin-vilanter (TRELEGY ELLIPTA) 200-62.5-25 mcg inhaler  Trelegy Ellipta 200 mcg-62.5 mcg-25 mcg powder for inhalation    INHALE 1 PUFF BY MOUTH ONCE A DAY     ?  albuterol (PROVENTIL HFA, VENTOLIN HFA, PROAIR HFA) 90 mcg/actuation inhaler  albuterol sulfate HFA 90 mcg/actuation aerosol inhaler    INHALE 2 PUFFS EVERY 6 HOURS AS NEEDED         ?  omeprazole (PRILOSEC) 20 mg capsule  Take 20 mg by mouth daily.            Review of Systems   Review of Systems    Constitutional: Negative.     Respiratory: Negative.      Cardiovascular: Negative.     Gastrointestinal: Negative.     Musculoskeletal:  Positive for joint pain.         + hip pain    All other systems reviewed and are negative.               Objective:        Visit Vitals      BP  115/80     Pulse  67     Temp  98.5 ??F (36.9 ??C)     Resp  18     Ht  _0  (1.6 m)     Wt  45.8 kg (101 lb)     SpO2  94%        BMI  17.89 kg/m??      O2 Flow Rate (L/min): 2 l/min O2 Device: Nasal cannula      Temp (24hrs), Avg:98.5 ??F (36.9 ??C), Min:98.5 ??F (36.9 ??C), Max:98.5 ??F (36.9 ??C)         No intake/output data recorded.   No intake/output data recorded.        CT HIP LT WO CONT       Final Result          Left femoral neck subcapital fracture with 2.5 cm proximal migration. No     underlying bone lesion. No significant arthritis.            XR FEMUR LT 2 V       Final Result     Unchanged, displaced left femoral neck fracture. No additional femur fracture.                 XR HIP LT W OR WO PELV 2-3 VWS        Final Result     Displaced left femoral neck fracture.            XR KNEE LT 3 V       Final Result     No acute abnormality.                   PHYSICAL EXAM:      Physical Exam   Vitals and nursing note reviewed.    Constitutional:        Appearance: She is ill-appearing.       Comments: Chronically ill-appearing female but pleasant    HENT:  Head: Normocephalic and atraumatic.       Mouth/Throat:       Comments: Poor dentition   Cardiovascular:       Rate and Rhythm: Normal rate and regular rhythm.    Pulmonary:       Effort: No respiratory distress.       Breath sounds: No wheezing.       Comments: On nasal cannula    Abdominal:       General: Bowel sounds are normal. There is no distension.       Palpations: Abdomen is soft.       Tenderness: There is no abdominal tenderness.     Musculoskeletal:       Right lower leg: No edema.       Left lower leg: No edema.       Comments: Left lower extremity externally rotated with tenderness to palpation of lateral left femoral head    Skin:      General: Skin is warm.       Capillary Refill: Capillary refill takes less than 2 seconds.    Neurological:       Mental Status: She is alert and oriented to person, place, and time.    Psychiatric:          Mood and Affect: Mood normal.           Data Review        Recent Results (from the past 24 hour(s))     CBC WITH AUTOMATED DIFF          Collection Time: 01/25/21  4:08 PM         Result  Value  Ref Range            WBC  9.7  3.6 - 11.0 K/uL       RBC  3.84  3.80 - 5.20 M/uL       HGB  11.9  11.5 - 16.0 g/dL       HCT  37.0  35.0 - 47.0 %       MCV  96.4  80.0 - 99.0 FL       MCH  31.0  26.0 - 34.0 PG       MCHC  32.2  30.0 - 36.5 g/dL       RDW  12.5  11.5 - 14.5 %       PLATELET  410 (H)  150 - 400 K/uL       MPV  9.1  8.9 - 12.9 FL       NRBC  0.0  0.0 PER 100 WBC       ABSOLUTE NRBC  0.00  0.00 - 0.01 K/uL       NEUTROPHILS  63  32 - 75 %       LYMPHOCYTES  21  12 - 49 %       MONOCYTES  5  5 - 13 %       EOSINOPHILS   9 (H)  0 - 7 %       BASOPHILS  1  0 - 1 %       IMMATURE GRANULOCYTES  1 (H)  0 - 0.5 %       ABS. NEUTROPHILS  6.3  1.8 - 8.0 K/UL       ABS. LYMPHOCYTES  2.1  0.8 - 3.5 K/UL       ABS. MONOCYTES  0.4  0.0 - 1.0 K/UL  ABS. EOSINOPHILS  0.8 (H)  0.0 - 0.4 K/UL       ABS. BASOPHILS  0.1  0.0 - 0.1 K/UL       ABS. IMM. GRANS.  0.1 (H)  0.00 - 0.04 K/UL       DF  AUTOMATED          METABOLIC PANEL, COMPREHENSIVE          Collection Time: 01/25/21  4:08 PM         Result  Value  Ref Range            Sodium  139  136 - 145 mmol/L       Potassium  4.2  3.5 - 5.1 mmol/L       Chloride  108  97 - 108 mmol/L       CO2  24  21 - 32 mmol/L       Anion gap  7  5 - 15 mmol/L       Glucose  98  65 - 100 mg/dL       BUN  20  6 - 20 mg/dL       Creatinine  0.92  0.55 - 1.02 mg/dL       BUN/Creatinine ratio  22 (H)  12 - 20         eGFR  >60  >60 ml/min/1.89m       Calcium  9.2  8.5 - 10.1 mg/dL       Bilirubin, total  0.2  0.2 - 1.0 mg/dL       AST (SGOT)  19  15 - 37 U/L       ALT (SGPT)  23  12 - 78 U/L       Alk. phosphatase  102  45 - 117 U/L       Protein, total  6.7  6.4 - 8.2 g/dL       Albumin  3.3 (L)  3.5 - 5.0 g/dL       Globulin  3.4  2.0 - 4.0 g/dL       A-G Ratio  1.0 (L)  1.1 - 2.2         PROTHROMBIN TIME + INR          Collection Time: 01/25/21  4:08 PM         Result  Value  Ref Range            Prothrombin time  13.7  11.9 - 14.6 sec       INR  1.0  0.9 - 1.1         TYPE & SCREEN          Collection Time: 01/25/21  4:08 PM         Result  Value  Ref Range            Crossmatch Expiration  01/28/2021,2359         ABO/Rh(D)  OJenetta DownerPositive         Antibody screen  Negative         URINALYSIS W/MICROSCOPIC          Collection Time: 01/25/21  5:00 PM         Result  Value  Ref Range            Color  Yellow/Straw          Appearance  Clear  Clear  Specific gravity  1.019  1.003 - 1.030         pH (UA)  6.0  5.0 - 8.0         Protein  Negative  Negative mg/dL       Glucose  Negative  Negative mg/dL        Ketone  5 (A)  Negative mg/dL       Bilirubin  Negative  Negative         Blood  Small (A)  Negative         Urobilinogen  0.1  0.1 - 1.0 EU/dL       Nitrites  Negative  Negative         Leukocyte Esterase  Trace (A)  Negative         WBC  0-4  0 - 4 /hpf       RBC  0-5  0 - 5 /hpf       Epithelial cells  Few  Few /lpf       Bacteria  Negative  Negative /hpf       Mucus  Trace (A)  Negative /lpf       COVID-19 RAPID TEST          Collection Time: 01/25/21  5:47 PM         Result  Value  Ref Range            COVID-19 rapid test  DETECTED (A)  Not Detected         INFLUENZA A & B AG (RAPID TEST)          Collection Time: 01/25/21  5:47 PM         Result  Value  Ref Range            Influenza A Antigen  Negative  Negative         Influenza B Antigen  Negative  Negative         METABOLIC PANEL, BASIC          Collection Time: 01/26/21  4:30 AM         Result  Value  Ref Range            Sodium  140  136 - 145 mmol/L       Potassium  4.1  3.5 - 5.1 mmol/L       Chloride  108  97 - 108 mmol/L       CO2  23  21 - 32 mmol/L       Anion gap  9  5 - 15 mmol/L       Glucose  87  65 - 100 mg/dL       BUN  20  6 - 20 mg/dL       Creatinine  0.71  0.55 - 1.02 mg/dL       BUN/Creatinine ratio  28 (H)  12 - 20         eGFR  >60  >60 ml/min/1.62m       Calcium  9.0  8.5 - 10.1 mg/dL       CBC W/O DIFF          Collection Time: 01/26/21  4:30 AM         Result  Value  Ref Range            WBC  12.5 (H)  3.6 - 11.0 K/uL       RBC  3.61 (L)  3.80 - 5.20 M/uL       HGB  11.1 (L)  11.5 - 16.0 g/dL       HCT  35.0  35.0 - 47.0 %       MCV  97.0  80.0 - 99.0 FL       MCH  30.7  26.0 - 34.0 PG       MCHC  31.7  30.0 - 36.5 g/dL       RDW  12.4  11.5 - 14.5 %       PLATELET  377  150 - 400 K/uL       MPV  8.7 (L)  8.9 - 12.9 FL       NRBC  0.0  0.0 PER 100 WBC            ABSOLUTE NRBC  0.00  0.00 - 0.01 K/uL              Assessment/Plan:        Active Problems:     Hip fracture ([place]) (01/25/2021)            Hospital Course:      reema chick  is a 76 year old female with a PMH of COPD and HLD who presented after a GLF from [place] on her left hip. In ED vitals stable. XR of left hip showed displaced femoral neck fracture. Initial labs non-significant. Patient admitted  for left femoral fracture management. Ortho consulted. CT of left hip showed left femoral neck subcapital fracture with proximal migration.      Left femoral neck fracture   XR results as above   IV morphine and roxicodone PRN pain   Orthopedic consulted      COPD without acute exacerbation   Not hypoxic   Continue on trelegy alternative while inpatient      HFpEF without acute exacerbation   11/2020 ECHO EF 50-55%   Continue on metoprolol and atorvastatin      DVT Prophylaxis: lovenox   GI Prophylaxis: protonix   Discharge and disposition barriers: ortho +/- surgery, 48hr      Care Plan discussed with: Patient/Family and Nurse      Total time spent with patient: 35 minutes.

## 2021-01-26 NOTE — ED Notes (Signed)
 TRANSFER - OUT REPORT:    Verbal report given to [facility] RN(name) on [name]  being transferred to [facility] (unit) for routine progression of care       Report consisted of patient's Situation, Background, Assessment and   Recommendations(SBAR).     Information from the following report(s) SBAR, ED Summary and MAR was reviewed with the receiving nurse.    Lines:   Peripheral IV 01/25/21 Left Forearm (Active)   Site Assessment Clean, dry, & intact 01/25/21 1535   Phlebitis Assessment 0 01/25/21 1535   Infiltration Assessment 0 01/25/21 1535   Dressing Status Clean, dry, & intact 01/25/21 1535   Dressing Type Transparent 01/25/21 1535        Opportunity for questions and clarification was provided.      Patient transported with:   [employer]

## 2021-01-26 NOTE — Anesthesia Post-Procedure Evaluation (Signed)
Procedure(s):  Left Hip Hemi-Arthroplasty, Anterior Approach.    general    Anesthesia Post Evaluation      Multimodal analgesia: multimodal analgesia used between 6 hours prior to anesthesia start to PACU discharge  Patient location during evaluation: PACU  Patient participation: complete - patient participated  Level of consciousness: awake  Pain score: 0  Pain management: adequate  Airway patency: patent  Anesthetic complications: no  Cardiovascular status: acceptable  Respiratory status: acceptable  Hydration status: acceptable  Post anesthesia nausea and vomiting:  controlled  Final Post Anesthesia Temperature Assessment:  Normothermia (36.0-37.5 degrees C)      INITIAL Post-op Vital signs:   Vitals Value Taken Time   BP 118/79 01/26/21 1929   Temp 36.7 ??C (98 ??F) 01/26/21 1930   Pulse 98 01/26/21 1930   Resp 15 01/26/21 1930   SpO2 94 % 01/26/21 1930

## 2021-01-27 LAB — METABOLIC PANEL, COMPREHENSIVE
A-G Ratio: 0.8 — ABNORMAL LOW (ref 1.1–2.2)
ALT (SGPT): 18 U/L (ref 12–78)
AST (SGOT): 20 U/L (ref 15–37)
Albumin: 2.8 g/dL — ABNORMAL LOW (ref 3.5–5.0)
Alk. phosphatase: 93 U/L (ref 45–117)
Anion gap: 11 mmol/L (ref 5–15)
BUN/Creatinine ratio: 23 — ABNORMAL HIGH (ref 12–20)
BUN: 20 mg/dL (ref 6–20)
Bilirubin, total: 0.5 mg/dL (ref 0.2–1.0)
CO2: 20 mmol/L — ABNORMAL LOW (ref 21–32)
Calcium: 9 mg/dL (ref 8.5–10.1)
Chloride: 104 mmol/L (ref 97–108)
Creatinine: 0.87 mg/dL (ref 0.55–1.02)
Globulin: 3.5 g/dL (ref 2.0–4.0)
Glucose: 112 mg/dL — ABNORMAL HIGH (ref 65–100)
Potassium: 4.4 mmol/L (ref 3.5–5.1)
Protein, total: 6.3 g/dL — ABNORMAL LOW (ref 6.4–8.2)
Sodium: 135 mmol/L — ABNORMAL LOW (ref 136–145)
eGFR: >60 mL/min/{1.73_m2} (ref >60)

## 2021-01-27 LAB — CBC WITH AUTOMATED DIFF
ABS. BASOPHILS: 0 10*3/uL (ref 0.0–0.1)
ABS. EOSINOPHILS: 0 10*3/uL (ref 0.0–0.4)
ABS. IMM. GRANS.: 0.1 10*3/uL — ABNORMAL HIGH (ref 0.00–0.04)
ABS. LYMPHOCYTES: 1.6 10*3/uL (ref 0.8–3.5)
ABS. MONOCYTES: 1.2 10*3/uL — ABNORMAL HIGH (ref 0.0–1.0)
ABS. NEUTROPHILS: 14.7 10*3/uL — ABNORMAL HIGH (ref 1.8–8.0)
ABSOLUTE NRBC: 0 10*3/uL (ref 0.00–0.01)
BASOPHILS: 0 % (ref 0–1)
EOSINOPHILS: 0 % (ref 0–7)
HCT: 34.3 % — ABNORMAL LOW (ref 35.0–47.0)
HGB: 11 g/dL — ABNORMAL LOW (ref 11.5–16.0)
IMMATURE GRANULOCYTES: 1 % — ABNORMAL HIGH (ref 0–0.5)
LYMPHOCYTES: 9 % — ABNORMAL LOW (ref 12–49)
MCH: 30.9 PG (ref 26.0–34.0)
MCHC: 32.1 g/dL (ref 30.0–36.5)
MCV: 96.3 FL (ref 80.0–99.0)
MONOCYTES: 7 % (ref 5–13)
MPV: 9.1 FL (ref 8.9–12.9)
NEUTROPHILS: 83 % — ABNORMAL HIGH (ref 32–75)
NRBC: 0 PER 100 WBC
PLATELET: 387 10*3/uL (ref 150–400)
RBC: 3.56 M/uL — ABNORMAL LOW (ref 3.80–5.20)
RDW: 12.6 % (ref 11.5–14.5)
WBC: 17.5 10*3/uL — ABNORMAL HIGH (ref 3.6–11.0)

## 2021-01-27 LAB — HEMOGLOBIN
HGB: 10.9 g/dL — ABNORMAL LOW (ref 11.5–16.0)
Hemoglobin: 10.9 g/dL — ABNORMAL LOW (ref 11.5–16.0)

## 2021-01-27 LAB — CBC WITH AUTO DIFFERENTIAL
Basophils %: 0 % (ref 0–1)
Basophils Absolute: 0 10*3/uL (ref 0.0–0.1)
Eosinophils %: 0 % (ref 0–7)
Eosinophils Absolute: 0 10*3/uL (ref 0.0–0.4)
Granulocyte Absolute Count: 0.1 10*3/uL — ABNORMAL HIGH (ref 0.00–0.04)
Hematocrit: 34.3 % — ABNORMAL LOW (ref 35.0–47.0)
Hemoglobin: 11 g/dL — ABNORMAL LOW (ref 11.5–16.0)
Immature Granulocytes: 1 % — ABNORMAL HIGH (ref 0–0.5)
Lymphocytes %: 9 % — ABNORMAL LOW (ref 12–49)
Lymphocytes Absolute: 1.6 10*3/uL (ref 0.8–3.5)
MCH: 30.9 PG (ref 26.0–34.0)
MCHC: 32.1 g/dL (ref 30.0–36.5)
MCV: 96.3 FL (ref 80.0–99.0)
MPV: 9.1 FL (ref 8.9–12.9)
Monocytes %: 7 % (ref 5–13)
Monocytes Absolute: 1.2 10*3/uL — ABNORMAL HIGH (ref 0.0–1.0)
NRBC Absolute: 0 10*3/uL (ref 0.00–0.01)
Neutrophils %: 83 % — ABNORMAL HIGH (ref 32–75)
Neutrophils Absolute: 14.7 10*3/uL — ABNORMAL HIGH (ref 1.8–8.0)
Nucleated RBCs: 0 PER 100 WBC
Platelets: 387 10*3/uL (ref 150–400)
RBC: 3.56 M/uL — ABNORMAL LOW (ref 3.80–5.20)
RDW: 12.6 % (ref 11.5–14.5)
WBC: 17.5 10*3/uL — ABNORMAL HIGH (ref 3.6–11.0)

## 2021-01-27 LAB — COMPREHENSIVE METABOLIC PANEL
ALT: 18 U/L (ref 12–78)
AST: 20 U/L (ref 15–37)
Albumin/Globulin Ratio: 0.8 — ABNORMAL LOW (ref 1.1–2.2)
Albumin: 2.8 g/dL — ABNORMAL LOW (ref 3.5–5.0)
Alkaline Phosphatase: 93 U/L (ref 45–117)
Anion Gap: 11 mmol/L (ref 5–15)
BUN: 20 mg/dL (ref 6–20)
Bun/Cre Ratio: 23 — ABNORMAL HIGH (ref 12–20)
CO2: 20 mmol/L — ABNORMAL LOW (ref 21–32)
Calcium: 9 mg/dL (ref 8.5–10.1)
Chloride: 104 mmol/L (ref 97–108)
Creatinine: 0.87 mg/dL (ref 0.55–1.02)
ESTIMATED GLOMERULAR FILTRATION RATE: >60 mL/min/{1.73_m2} (ref >60)
Globulin: 3.5 g/dL (ref 2.0–4.0)
Glucose: 112 mg/dL — ABNORMAL HIGH (ref 65–100)
Potassium: 4.4 mmol/L (ref 3.5–5.1)
Sodium: 135 mmol/L — ABNORMAL LOW (ref 136–145)
Total Bilirubin: 0.5 mg/dL (ref 0.2–1.0)
Total Protein: 6.3 g/dL — ABNORMAL LOW (ref 6.4–8.2)

## 2021-01-27 MED ORDER — SODIUM CHLORIDE 0.9 % IJ SYRG
INTRAMUSCULAR | Status: DC | PRN
Start: 2021-01-27 — End: 2021-01-28

## 2021-01-27 MED ORDER — OXYCODONE 5 MG TAB
5 mg | Freq: Once | ORAL | Status: DC | PRN
Start: 2021-01-27 — End: 2021-01-26

## 2021-01-27 MED ORDER — SODIUM CHLORIDE 0.9 % IJ SYRG
Freq: Three times a day (TID) | INTRAMUSCULAR | Status: DC
Start: 2021-01-27 — End: 2021-01-26

## 2021-01-27 MED ORDER — SODIUM CHLORIDE 0.9% BOLUS IV
0.9 % | Freq: Once | INTRAVENOUS | Status: AC | PRN
Start: 2021-01-27 — End: 2021-01-27

## 2021-01-27 MED ORDER — MORPHINE 15 MG TAB
15 mg | Freq: Once | ORAL | Status: DC | PRN
Start: 2021-01-27 — End: 2021-01-26

## 2021-01-27 MED ORDER — NALOXONE 0.4 MG/ML INJECTION
0.4 mg/mL | INTRAMUSCULAR | Status: DC | PRN
Start: 2021-01-27 — End: 2021-01-28

## 2021-01-27 MED ORDER — MIDAZOLAM 1 MG/ML IJ SOLN
1 mg/mL | INTRAMUSCULAR | Status: DC | PRN
Start: 2021-01-27 — End: 2021-01-26

## 2021-01-27 MED ORDER — ONDANSETRON (PF) 4 MG/2 ML INJECTION
4 mg/2 mL | INTRAMUSCULAR | Status: AC | PRN
Start: 2021-01-27 — End: 2021-01-27

## 2021-01-27 MED ORDER — OXYCODONE-ACETAMINOPHEN 5 MG-325 MG TAB
5-325 mg | Freq: Once | ORAL | Status: DC | PRN
Start: 2021-01-27 — End: 2021-01-26

## 2021-01-27 MED ORDER — OXYCODONE-ACETAMINOPHEN 5 MG-325 MG TAB
5-325 mg | ORAL | Status: DC | PRN
Start: 2021-01-27 — End: 2021-01-26

## 2021-01-27 MED ORDER — HYDROMORPHONE 2 MG TAB
2 mg | Freq: Once | ORAL | Status: DC | PRN
Start: 2021-01-27 — End: 2021-01-26

## 2021-01-27 MED ORDER — MEPERIDINE (PF) 25 MG/ML INJ SOLUTION
25 mg/ml | Freq: Once | INTRAMUSCULAR | Status: DC
Start: 2021-01-27 — End: 2021-01-26
  Administered 2021-01-27: 01:00:00 via INTRAVENOUS

## 2021-01-27 MED ORDER — SODIUM CHLORIDE 0.9 % IJ SYRG
INTRAMUSCULAR | Status: DC | PRN
Start: 2021-01-27 — End: 2021-01-26

## 2021-01-27 MED ORDER — FENTANYL CITRATE (PF) 50 MCG/ML IJ SOLN
50 mcg/mL | INTRAMUSCULAR | Status: DC | PRN
Start: 2021-01-27 — End: 2021-01-26

## 2021-01-27 MED ORDER — METOPROLOL TARTRATE 5 MG/5 ML IV SOLN
5 mg/ mL | INTRAVENOUS | Status: DC | PRN
Start: 2021-01-27 — End: 2021-01-26

## 2021-01-27 MED ORDER — DIPHENHYDRAMINE HCL 50 MG/ML IJ SOLN
50 mg/mL | INTRAMUSCULAR | Status: DC | PRN
Start: 2021-01-27 — End: 2021-01-26

## 2021-01-27 MED ORDER — SENNOSIDES-DOCUSATE SODIUM 8.6 MG-50 MG TAB
Freq: Two times a day (BID) | ORAL | Status: DC
Start: 2021-01-27 — End: 2021-01-28
  Administered 2021-01-27 – 2021-01-28 (×3): via ORAL

## 2021-01-27 MED ORDER — TRAMADOL 50 MG TAB
50 mg | Freq: Four times a day (QID) | ORAL | Status: DC | PRN
Start: 2021-01-27 — End: 2021-01-28

## 2021-01-27 MED ORDER — LABETALOL 5 MG/ML IV SYRINGE
20 mg/4 mL (5 mg/mL) | INTRAVENOUS | Status: DC | PRN
Start: 2021-01-27 — End: 2021-01-26

## 2021-01-27 MED ORDER — HYDROMORPHONE 0.5 MG/0.5 ML SYRINGE
0.5 mg/ mL | INTRAMUSCULAR | Status: AC | PRN
Start: 2021-01-27 — End: 2021-01-27

## 2021-01-27 MED ORDER — ACETAMINOPHEN 325 MG TABLET
325 mg | Freq: Four times a day (QID) | ORAL | Status: DC | PRN
Start: 2021-01-27 — End: 2021-01-28

## 2021-01-27 MED ORDER — DEXAMETHASONE SODIUM PHOSPHATE 4 MG/ML IJ SOLN
4 mg/mL | Freq: Once | INTRAMUSCULAR | Status: AC
Start: 2021-01-27 — End: 2021-01-27

## 2021-01-27 MED ORDER — ONDANSETRON (PF) 4 MG/2 ML INJECTION
4 mg/2 mL | INTRAMUSCULAR | Status: DC | PRN
Start: 2021-01-27 — End: 2021-01-26

## 2021-01-27 MED ORDER — MORPHINE 10 MG/ML IJ SOLN
10 mg/mL | INTRAMUSCULAR | Status: DC | PRN
Start: 2021-01-27 — End: 2021-01-26

## 2021-01-27 MED ORDER — SODIUM CHLORIDE 0.9 % IV
INTRAVENOUS | Status: DC
Start: 2021-01-27 — End: 2021-01-27
  Administered 2021-01-27: 02:00:00 via INTRAVENOUS

## 2021-01-27 MED ORDER — HYDRALAZINE 20 MG/ML IJ SOLN
20 mg/mL | INTRAMUSCULAR | Status: DC | PRN
Start: 2021-01-27 — End: 2021-01-26

## 2021-01-27 MED ORDER — BISACODYL 10 MG RECTAL SUPPOSITORY
10 mg | Freq: Every day | RECTAL | Status: DC | PRN
Start: 2021-01-27 — End: 2021-01-28

## 2021-01-27 MED ORDER — ENOXAPARIN 40 MG/0.4 ML SUB-Q SYRINGE
40 mg/0.4 mL | Freq: Every day | SUBCUTANEOUS | Status: DC
Start: 2021-01-27 — End: 2021-01-28
  Administered 2021-01-27 – 2021-01-28 (×2): via SUBCUTANEOUS

## 2021-01-27 MED ORDER — EPHEDRINE SULFATE 50 MG/ML INTRAVENOUS SOLUTION
50 mg/mL | INTRAVENOUS | Status: DC | PRN
Start: 2021-01-27 — End: 2021-01-26

## 2021-01-27 MED ORDER — HYDROMORPHONE 0.5 MG/0.5 ML SYRINGE
0.5 mg/ mL | INTRAMUSCULAR | Status: DC | PRN
Start: 2021-01-27 — End: 2021-01-26

## 2021-01-27 MED ORDER — HYDROCODONE-ACETAMINOPHEN 5 MG-325 MG TAB
5-325 mg | Freq: Once | ORAL | Status: DC | PRN
Start: 2021-01-27 — End: 2021-01-26

## 2021-01-27 MED ORDER — WATER FOR INJECTION, STERILE INJECTION
1 gram | Freq: Three times a day (TID) | INTRAMUSCULAR | Status: AC
Start: 2021-01-27 — End: 2021-01-27
  Administered 2021-01-27: 02:00:00 via INTRAVENOUS

## 2021-01-27 MED ORDER — POLYETHYLENE GLYCOL 3350 17 GRAM (100 %) ORAL POWDER PACKET
17 gram | Freq: Every day | ORAL | Status: DC
Start: 2021-01-27 — End: 2021-01-28
  Administered 2021-01-27 – 2021-01-28 (×2): via ORAL

## 2021-01-27 MED ORDER — SUGAMMADEX 100 MG/ML INTRAVENOUS SOLUTION
100 mg/mL | INTRAVENOUS | Status: DC | PRN
Start: 2021-01-27 — End: 2021-01-26
  Administered 2021-01-27: via INTRAVENOUS

## 2021-01-27 MED FILL — SODIUM CHLORIDE 0.9 % IJ SYRG: INTRAMUSCULAR | Qty: 40

## 2021-01-27 MED FILL — ENOXAPARIN 40 MG/0.4 ML SUB-Q SYRINGE: 40 mg/0.4 mL | SUBCUTANEOUS | Qty: 0.4

## 2021-01-27 MED FILL — CEFAZOLIN 1 GRAM SOLUTION FOR INJECTION: 1 gram | INTRAMUSCULAR | Qty: 2000

## 2021-01-27 MED FILL — PANTOPRAZOLE 40 MG TAB, DELAYED RELEASE: 40 mg | ORAL | Qty: 1

## 2021-01-27 MED FILL — METOPROLOL SUCCINATE SR 25 MG 24 HR TAB: 25 mg | ORAL | Qty: 1

## 2021-01-27 MED FILL — SPIRIVA RESPIMAT 2.5 MCG/ACTUATION SOLUTION FOR INHALATION: 2.5 mcg/actuation | RESPIRATORY_TRACT | Qty: 4

## 2021-01-27 MED FILL — POLYETHYLENE GLYCOL 3350 17 GRAM (100 %) ORAL POWDER PACKET: 17 gram | ORAL | Qty: 1

## 2021-01-27 MED FILL — SODIUM CHLORIDE 0.9 % IV: INTRAVENOUS | Qty: 500

## 2021-01-27 MED FILL — OXYCODONE 5 MG TAB: 5 mg | ORAL | Qty: 1

## 2021-01-27 MED FILL — BUDESONIDE-FORMOTEROL HFA 160 MCG-4.5 MCG/ACTUATION AEROSOL INHALER: RESPIRATORY_TRACT | Qty: 10.2

## 2021-01-27 MED FILL — ATORVASTATIN 40 MG TAB: 40 mg | ORAL | Qty: 1

## 2021-01-27 MED FILL — SODIUM CHLORIDE 0.9 % IV: INTRAVENOUS | Qty: 1000

## 2021-01-27 MED FILL — STIMULANT LAXATIVE PLUS 8.6 MG-50 MG TABLET: ORAL | Qty: 1

## 2021-01-27 NOTE — Progress Notes (Signed)
 PHYSICAL THERAPY EVALUATION  Patient: [name] (76 y.o. female)  Date: 01/27/2021  Primary Diagnosis: Hip fracture (HCC) [S72.009A]  Procedure(s) (LRB):  Left Hip Hemi-Arthroplasty, Anterior Approach (Left) 1 Day Post-Op   Precautions: WBAT LLE      ASSESSMENT  Pt is a 76 y.o. female admitted on 01/27/2021 for fall and FX left hip; pt currently being treated for ORIF left hip and is WBAT LLE . Pt supine in bed  upon PT arrival, agreeable to evaluation. Pt A&O x 4.      Based on the objective data described, the patient presents with generalized weakness, impaired functional mobility, impaired amb, impaired balance, and endurance to activities. (See below for objective details and assist levels). Overall pt tolerated session good today with PT. Pt will benefit from continued skilled PT to address above deficits and return to PLOF. Current PT DC recommendation Skilled Nursing Facility once medically appropriate.    Current Level of Function Impacting Discharge (mobility/balance): decreased rom, strength, endurance to     Other factors to consider for discharge: SNF      PLAN :  Recommendations and Planned Interventions: bed mobility training, transfer training, therapeutic exercises, patient and family training/education, and therapeutic activities      Recommend for staff: Frequent repositioning to prevent skin breakdown and LE elevation for management of edema    Frequency/Duration: Patient will be followed by physical therapy:  2-3x/week to address goals.    Recommendation for discharge: (in order for the patient to meet his/her long term goals)  Skilled Nursing Facility    This discharge recommendation:  Has been made in collaboration with the attending provider and/or case management    IF patient discharges home will need the following DME: hospital bed, mechanical lift, rolling walker, and wheelchair         SUBJECTIVE:   Patient stated "I am ok."    OBJECTIVE DATA SUMMARY:   HISTORY:    Past Medical  History:   Diagnosis Date    Chronic obstructive pulmonary disease (HCC)     Hypercholesteremia     Insomnia     Sleep disorder    No past surgical history on file.    Home Situation  Home Environment: Skilled nursing facility  Care Facility Name: [facility]  # Steps to Enter: 0  One/Two Story Residence: One story  Living Alone: Yes  Support Systems: Skilled Nursing Facility  Patient Expects to be Discharged to:: Skilled nursing facility  Current DME Used/Available at Home: [employer], Wheelchair    EXAMINATION/PRESENTATION/DECISION MAKING:   Critical Behavior:              Hearing:  Auditory  Auditory Impairment: Hard of hearing, bilateral  Skin:  intact  Edema: bil les  Range Of Motion:  AROM: Generally decreased, functional                       Strength:    Strength: Generally decreased, functional                    Tone & Sensation:   Tone: Normal                              Functional Mobility:  Bed Mobility:  Rolling: Moderate assistance  Supine to Sit: Moderate assistance  Sit to Supine: Moderate assistance  Scooting: Moderate assistance;Maximum assistance  Transfers:  Sit to Stand: Maximum  assistance;Assist x2  Stand to Sit: Maximum assistance;Assist x2                       Balance:   Sitting: Intact;With support  Standing: Impaired;Pull to stand;With support  Standing - Static: Poor;Constant support  Standing - Dynamic : Poor;Constant support  Ambulation/Gait Training:  Distance (ft): 3 Feet (ft)  Assistive Device: Gait belt;Walker, rolling  Ambulation - Level of Assistance: Maximum assistance;Total assistance;Assist x2     Gait Description (WDL): Exceptions to WDL        Left Side Weight Bearing: As tolerated                                             Functional Measure:  [employer] AM-PACT "6 Clicks"         Basic Mobility Inpatient Short Form  How much difficulty does the patient currently have... Unable A Lot A Little None   1.  Turning over in bed (including adjusting  bedclothes, sheets and blankets)?   []  1   [x]  2   []  3   []  4   2.  Sitting down on and standing up from a chair with arms ( e.g., wheelchair, bedside commode, etc.)   []  1   [x]  2   []  3   []  4   3.  Moving from lying on back to sitting on the side of the bed?   []  1   [x]  2   []  3   []  4          How much help from another person does the patient currently need... Total A Lot A Little None   4.  Moving to and from a bed to a chair (including a wheelchair)?    1   [x]  2   []  3   []  4   5.  Need to walk in hospital room?   []  1   [x]  2   []  3   []  4   6.  Climbing 3-5 steps with a railing?   [x]  1   []  2   []  3   []  4    2007, Trustees of [address], under license to [place], [place]. All rights reserved     Score:  Initial: 13/24 Most Recent: X (Date: 01/27/2021 )   Interpretation of Tool:  Represents activities that are increasingly more difficult (i.e. Bed mobility, Transfers, Gait).  Score 24 23 22-20 19-15 14-10 9-7 6   Modifier CH CI CJ CK CL CM CN         Physical Therapy Evaluation Charge Determination   History Examination Presentation Decision-Making   LOW Complexity : Zero comorbidities / personal factors that will impact the outcome / POC LOW Complexity : 1-2 Standardized tests and measures addressing body structure, function, activity limitation and / or participation in recreation  LOW Complexity : Stable, uncomplicated  Other outcome measures ampac 6  13/24      Based on the above components, the patient evaluation is determined to be of the following complexity level: LOW     Pain Rating:  5/10 faces BLE    Activity Tolerance:   Fair    After treatment patient left in no apparent distress:   Bed locked and in lowest position Sitting in chair, Heels elevated for pressure relief,  and Call bell within reach     GOALS:    Problem: Mobility Impaired (Adult and Pediatric)  Goal: *Acute Goals and Plan of Care (Insert Text)  Description: Patient stated goal: feel better  Patient will move from supine to  sit and sit to supine , scoot up and down, and roll side to side in bed with mod assist within 7 day(s).    Patient will transfer from bed to chair and chair to bed with moderate assistance  using the least restrictive device within 7 day(s).    Patient will improve static standing balance to moderate assistance within 1 week(s).  Patient will ambulate 10 feet with moderate assistance with least restrictive device within 1 weeks.     Outcome: Progressing Towards Goal       COMMUNICATION/EDUCATION:   The patient's plan of care was discussed with: Occupational therapist, Registered nurse, and Case management.     Fall prevention education was provided and the patient/caregiver indicated understanding., Patient/family have participated as able in goal setting and plan of care., and Patient/family agree to work toward stated goals and plan of care.         Thank you for this referral.  Wilfredo JONELLE Blanch, PT   Time Calculation: 35 mins

## 2021-01-27 NOTE — Progress Notes (Signed)
 Comprehensive Nutrition Assessment    Type and Reason for Visit: (P) Initial (Low BMI)    Nutrition Recommendations/Plan:   Continue current diet, encourage good po intake and fluid intake  Add Ensure Enlive TID     Malnutrition Assessment:  Malnutrition Status:  No malnutrition (01/27/21 1530)    Context:  Acute illness     Findings of the 6 clinical characteristics of malnutrition:   Energy Intake:  No significant decrease in energy intake  Weight Loss:  No significant weight loss     Body Fat Loss:  Unable to assess,     Muscle Mass Loss:  Unable to assess,    Fluid Accumulation:  Unable to assess,    Grip Strength:  Not performed     Nutrition Assessment:    (P) Admitted with hip fracture.  GLF at facility as she was being discharged.  Xray positive for hip fracture.  POD 1 L hemiarthroplasty.  Awaiting rehab placement.  Patient with adequate po intake since last admission, weight has been stable.  Patient takes Ensure at home and would like to receive it here.  Labs: Na 135, Gluc 112, Alb 2.8, H/H 10.9/34.3.  Meds: IVFs, Albuterol , Lipitor , Lovenox , Dilaudid , Meltonin, Zofran , Protonix , Miralax , Senna.    Nutrition Related Findings:    (P) No N/V/C/D.  Denies chewing/swallowing problems despite missing bottom teeth.  +1 nonpitting edema LE.  LBM PTA. Wound Type: (P) Surgical incision    Current Nutrition Intake & Therapies:  Average Meal Intake: (P) 51-75%  Average Supplement Intake: (P) None ordered  ADULT DIET Regular    Anthropometric Measures:  Height: (P) 5' 3 (160 cm)  Ideal Body Weight (IBW): (P) 115 lbs ((P) 52 kg)  Admission Body Weight: (P) 101 lb  Current Body Wt:  (P) 45.8 kg (101 lb), (P) 87.8 % IBW. (P) Not specified  Current BMI (kg/m2): (P) 17.9  Usual Body Weight: (P) 45.4 kg (100 lb)  % Weight Change (Calculated): (P) 1  Weight Adjustment: (P) No adjustment                 BMI Category: (P) Underweight (BMI less than 22) age over 57    Estimated Daily Nutrient Needs:  Energy Requirements  Based On: (P) Kcal/kg  Weight Used for Energy Requirements: (P) Current  Energy (kcal/day): (P) 1350-1600 (30-35 kcals/kg)  Weight Used for Protein Requirements: (P) Current  Protein (g/day): (P) 45-55 grams/day (1.0-1.2 gm/kg)  Method Used for Fluid Requirements: (P) 1 ml/kcal  Fluid (ml/day): (P) 1500 mls/day    Nutrition Diagnosis:   (P) No nutrition diagnosis at this time related to   as evidenced by      Nutrition Interventions:   Food and/or Nutrient Delivery: (P) Continue current diet, Start oral nutrition supplement  Nutrition Education/Counseling: (P) No recommendations at this time  Coordination of Nutrition Care: (P) Continue to monitor while inpatient  Plan of Care discussed with: (P) RN    Goals:     Goals: (P) PO intake 75% or greater, by next RD assessment       Nutrition Monitoring and Evaluation:   Behavioral-Environmental Outcomes: (P) None identified  Food/Nutrient Intake Outcomes: (P) Food and nutrient intake, Supplement intake  Physical Signs/Symptoms Outcomes: (P) Meal time behavior    Discharge Planning:    (P) Too soon to determine    Powell KANDICE Dines, RD  Contact: [phone]

## 2021-01-27 NOTE — Progress Notes (Signed)
Progress Notes by Alain Honey, PA at 01/27/21 1308                Author: Alain Honey, PA  Service: Internal Medicine  Author Type: Physician Assistant       Filed: 01/27/21 1602  Date of Service: 01/27/21 0946  Status: Signed           Editor: Alain Honey, PA (Physician Assistant)  Cosigner: Ludwig Clarks, MD at 01/27/21 1611                     Hospitalist Progress Note        Subjective:     Daily Progress Note: 01/27/2021 7:23 AM      Patient resting comfortably.  Patient states where the procedure was done she has no pain but she does have left knee pain.        Current Facility-Administered Medications          Medication  Dose  Route  Frequency           ?  albuterol (PROVENTIL HFA, VENTOLIN HFA, PROAIR HFA) inhaler 2 Puff   2 Puff  Inhalation  Q6H PRN     ?  atorvastatin (LIPITOR) tablet 40 mg   40 mg  Oral  DAILY     ?  budesonide-formoteroL (SYMBICORT) 160-4.5 mcg/actuation HFA inhaler 2 Puff   2 Puff  Inhalation  BID RT          And           ?  tiotropium bromide (SPIRIVA RESPIMAT) 2.5 mcg /actuation   2 Puff  Inhalation  DAILY     ?  metoprolol succinate (TOPROL-XL) XL tablet 25 mg   25 mg  Oral  DAILY     ?  pantoprazole (PROTONIX) tablet 40 mg   40 mg  Oral  ACB     ?  hydrOXYzine pamoate (VISTARIL) capsule 25 mg   25 mg  Oral  TID PRN     ?  0.9% sodium chloride infusion   125 mL/hr  IntraVENous  CONTINUOUS           ?  sodium chloride 0.9 % bolus infusion 500 mL   500 mL  IntraVENous  ONCE PRN           ?  sodium chloride (NS) flush 5-40 mL   5-40 mL  IntraVENous  PRN     ?  acetaminophen (TYLENOL) tablet 650 mg   650 mg  Oral  Q6H PRN     ?  traMADoL (ULTRAM) tablet 50 mg   50 mg  Oral  Q6H PRN     ?  HYDROmorphone (DILAUDID) syringe 0.5 mg   0.5 mg  IntraVENous  Q4H PRN     ?  naloxone (NARCAN) injection 0.4 mg   0.4 mg  IntraVENous  PRN     ?  ondansetron (ZOFRAN) injection 4 mg   4 mg  IntraVENous  Q4H PRN     ?  senna-docusate (PERICOLACE) 8.6-50 mg per tablet  1 Tablet   1 Tablet  Oral  BID     ?  polyethylene glycol (MIRALAX) packet 17 g   17 g  Oral  DAILY     ?  [START ON 01/28/2021] bisacodyL (DULCOLAX) suppository 10 mg   10 mg  Rectal  DAILY PRN     ?  enoxaparin (LOVENOX) injection 40 mg   40  mg  SubCUTAneous  DAILY     ?  0.9% sodium chloride infusion   75 mL/hr  IntraVENous  CONTINUOUS     ?  sodium chloride (NS) flush 5-40 mL   5-40 mL  IntraVENous  PRN     ?  naloxone (NARCAN) injection 0.4 mg   0.4 mg  IntraVENous  PRN     ?  acetaminophen (TYLENOL) tablet 650 mg   650 mg  Oral  Q6H PRN          Or           ?  acetaminophen (TYLENOL) suppository 650 mg   650 mg  Rectal  Q6H PRN     ?  polyethylene glycol (MIRALAX) packet 17 g   17 g  Oral  DAILY PRN     ?  ondansetron (ZOFRAN ODT) tablet 4 mg   4 mg  Oral  Q8H PRN          Or           ?  ondansetron (ZOFRAN) injection 4 mg   4 mg  IntraVENous  Q6H PRN     ?  oxyCODONE IR (ROXICODONE) tablet 5 mg   5 mg  Oral  Q4H PRN     ?  hydrALAZINE (APRESOLINE) 20 mg/mL injection 10 mg   10 mg  IntraVENous  Q6H PRN           ?  melatonin tablet 9 mg   9 mg  Oral  QHS PRN            Review of Systems   Review of Systems    Constitutional: Negative.     Respiratory: Negative.      Cardiovascular: Negative.     Gastrointestinal: Negative.     Musculoskeletal:  Positive for joint pain.         + hip pain    All other systems reviewed and are negative.               Objective:        Visit Vitals      BP  109/67 (BP 1 Location: Right upper arm, BP Patient Position: At rest;Supine)     Pulse  (!) 106     Temp  97.6 ??F (36.4 ??C)     Resp  18     Ht  5' 3"  (1.6 m)     Wt  45.8 kg (101 lb)     SpO2  97%        BMI  17.89 kg/m??      O2 Flow Rate (L/min): 2 l/min O2 Device: Nasal cannula      Temp (24hrs), Avg:98.2 ??F (36.8 ??C), Min:97.6 ??F (36.4 ??C), Max:99 ??F (37.2 ??C)         No intake/output data recorded.   01/03 1901 - 01/05 0700   In: 1860 [I.V.:1860]   Out: 200         XR HIP LT W OR WO PELV 2-3 VWS       Final Result           1. Status post left hip arthroplasty without immediate complication.            XR FLUOROSCOPY UNDER 60 MINUTES       Final Result     Portable imaging during procedure.          REPORT PROVIDED FOR COMPLIANCE ONLY AT NO CHARGE.  CT HIP LT WO CONT       Final Result          Left femoral neck subcapital fracture with 2.5 cm proximal migration. No     underlying bone lesion. No significant arthritis.            XR FEMUR LT 2 V       Final Result     Unchanged, displaced left femoral neck fracture. No additional femur fracture.                 XR HIP LT W OR WO PELV 2-3 VWS       Final Result     Displaced left femoral neck fracture.            XR KNEE LT 3 V       Final Result     No acute abnormality.                   PHYSICAL EXAM:      Physical Exam   Vitals and nursing note reviewed.    Constitutional:        Appearance: She is ill-appearing.       Comments: Chronically ill-appearing female but pleasant    HENT:       Head: Normocephalic and atraumatic.       Mouth/Throat:       Comments: Poor dentition   Cardiovascular:       Rate and Rhythm: Normal rate and regular rhythm.    Pulmonary:       Effort: No respiratory distress.       Breath sounds: No wheezing.       Comments: On nasal cannula    Abdominal:       General: Bowel sounds are normal. There is no distension.       Palpations: Abdomen is soft.       Tenderness: There is no abdominal tenderness.     Musculoskeletal:       Right lower leg: No edema.       Left lower leg: No edema.       Comments: LLE:  Pico dressing in place.  Clean dry and intact    Skin:      General: Skin is warm.       Capillary Refill: Capillary refill takes less than 2 seconds.    Neurological:       Mental Status: She is alert and oriented to person, place, and time.    Psychiatric:          Mood and Affect: Mood normal.           Data Review        Recent Results (from the past 24 hour(s))     METABOLIC PANEL, COMPREHENSIVE          Collection Time: 01/27/21   8:08 AM         Result  Value  Ref Range            Sodium  135 (L)  136 - 145 mmol/L       Potassium  4.4  3.5 - 5.1 mmol/L       Chloride  104  97 - 108 mmol/L       CO2  20 (L)  21 - 32 mmol/L       Anion gap  11  5 - 15 mmol/L       Glucose  112 (  H)  65 - 100 mg/dL       BUN  20  6 - 20 mg/dL       Creatinine  0.87  0.55 - 1.02 mg/dL       BUN/Creatinine ratio  23 (H)  12 - 20         eGFR  >60  >60 ml/min/1.67m       Calcium  9.0  8.5 - 10.1 mg/dL       Bilirubin, total  0.5  0.2 - 1.0 mg/dL       AST (SGOT)  20  15 - 37 U/L       ALT (SGPT)  18  12 - 78 U/L       Alk. phosphatase  93  45 - 117 U/L       Protein, total  6.3 (L)  6.4 - 8.2 g/dL       Albumin  2.8 (L)  3.5 - 5.0 g/dL       Globulin  3.5  2.0 - 4.0 g/dL       A-G Ratio  0.8 (L)  1.1 - 2.2         CBC WITH AUTOMATED DIFF          Collection Time: 01/27/21  8:08 AM         Result  Value  Ref Range            WBC  17.5 (H)  3.6 - 11.0 K/uL       RBC  3.56 (L)  3.80 - 5.20 M/uL       HGB  11.0 (L)  11.5 - 16.0 g/dL       HCT  34.3 (L)  35.0 - 47.0 %       MCV  96.3  80.0 - 99.0 FL       MCH  30.9  26.0 - 34.0 PG       MCHC  32.1  30.0 - 36.5 g/dL       RDW  12.6  11.5 - 14.5 %       PLATELET  387  150 - 400 K/uL       MPV  9.1  8.9 - 12.9 FL       NRBC  0.0  0.0 PER 100 WBC       ABSOLUTE NRBC  0.00  0.00 - 0.01 K/uL       NEUTROPHILS  83 (H)  32 - 75 %       LYMPHOCYTES  9 (L)  12 - 49 %       MONOCYTES  7  5 - 13 %       EOSINOPHILS  0  0 - 7 %       BASOPHILS  0  0 - 1 %       IMMATURE GRANULOCYTES  1 (H)  0 - 0.5 %       ABS. NEUTROPHILS  14.7 (H)  1.8 - 8.0 K/UL       ABS. LYMPHOCYTES  1.6  0.8 - 3.5 K/UL       ABS. MONOCYTES  1.2 (H)  0.0 - 1.0 K/UL       ABS. EOSINOPHILS  0.0  0.0 - 0.4 K/UL       ABS. BASOPHILS  0.0  0.0 - 0.1 K/UL       ABS. IMM. GRANS.  0.1 (H)  0.00 - 0.04 K/UL  DF  AUTOMATED          HEMOGLOBIN          Collection Time: 01/27/21  8:09 AM         Result  Value  Ref Range            HGB  10.9 (L)  11.5 - 16.0 g/dL               Assessment/Plan:        Active Problems:     Hip fracture (HCC) (01/25/2021)        Closed fracture of neck of left femur ([place]) (01/26/2021)         Hospital Course:      [name] is a 76 year old female with a PMH of COPD and HLD who presented after a GLF from [place] on her left hip. In ED vitals stable. XR of left hip showed displaced femoral neck fracture. Initial labs non-significant. Patient admitted  for left femoral fracture management. Ortho consulted. CT of left hip showed left femoral neck subcapital fracture with proximal migration. ORIF of left hip on 1/4. PT/OT recommending SNF.      Left femoral neck fracture   S/p ORIF of left hip on 1/4   IV morphine and roxicodone PRN pain   Orthopedic following      COPD without acute exacerbation   Not hypoxic   Continue on trelegy alternative while inpatient      HFpEF without acute exacerbation   11/2020 ECHO EF 50-55%   Continue on metoprolol and atorvastatin      DVT Prophylaxis: lovenox   GI Prophylaxis: protonix   Discharge and disposition barriers: pain control, PT/OT, ortho 24-48hr      Care Plan discussed with: Patient/Family and Nurse      Total time spent with patient: 35 minutes.

## 2021-01-27 NOTE — Progress Notes (Signed)
CM met with patient at bedside to confirm DCP and patient verifies that she wants to return to [facility] upon DC.     OT note will be needed for SNF to get auth.    Updated clinicals uploaded in [place].

## 2021-01-27 NOTE — Progress Notes (Signed)
Physician Progress Note      PATIENT:               [name], [name]  CSN #:                  [id number]  DOB:                       March 11, 1945  ADMIT DATE:       01/25/2021 3:17 PM  DISCH DATE:  RESPONDING  PROVIDER #:        Marton Redwood PA          QUERY TEXT:    Patient admitted with fall with hip fracture, noted to have +COVID-19 test. If possible, please document in progress notes and discharge summary if you are evaluating and/or treating any of the following:    The medical record reflects the following:  Risk Factors: 76 year old female, resident of [facility] healthcare facility  Clinical Indicators: 1/3 Ortho consult - recent COVID infection.  Regarding the  COVID infection patient is unsure when she got it and is asymptomatic  01/25/21 COVID Detected  Treatment: No current treatment for COVID-19      Please email [email or web address] with any questions  Options provided:  -- COVID-19  -- COVID-19 not clinically significant  -- Other - I will add my own diagnosis  -- Disagree - Not applicable / Not valid  -- Disagree - Clinically unable to determine / Unknown  -- Refer to Clinical Documentation Reviewer    PROVIDER RESPONSE TEXT:    COVID-19 is not clinically significant.    Query created by: Jill Alexanders on 01/27/2021 6:47 AM      Electronically signed by:  Marton Redwood PA 01/27/2021 4:00 PM

## 2021-01-27 NOTE — Progress Notes (Signed)
 OCCUPATIONAL THERAPY EVALUATION  Patient: [name] (76 y.o. female)  Date: 01/27/2021  Primary Diagnosis: Hip fracture (HCC) [S72.009A]  Procedure(s) (LRB):  Left Hip Hemi-Arthroplasty, Anterior Approach (Left) 1 Day Post-Op   Precautions: WBAT LLE, fall risk       ASSESSMENT  Pt is a 76 y.o. female presenting to [facility] with c/o L knee and hip pain, admitted 01/25/2021 and currently being treated for s/p L hip hemiarthroplasty; POD #1. Pt received sitting at EOB w/ PCT in room upon arrival, AXO x4, and agreeable to OT evaluation. X-ray negative for L knee fx. Pt w/ order for WBAT LLE; pt educated and demo'd understanding.     Based on current observations, pt presents with deficits in generalized strength/AROM, bed mobility, static/dynamic sitting balance, static/dynamic standing balance (see PT note for gait details), functional activity tolerance, and pain currently impacting overall performance of ADLs and functional transfers/mobility (see below for objective details and assist levels). Therapist observed PCT transferring pt from recliner to EOB; req'ing max A via stand pivot transfer.  Max A for dpu:dlepwz; assistance with BLE due to pain. She req'd total A for posterior pericare in supine. Max x2 for scooting up in bed due to pain in L hip/knee. No observed SOB or dizziness. Pt positioned for comfort. Pt pleasant and cooperative throughout session. Pt would benefit from continued skilled OT services to address current impairments and improve IND and safety with self cares and functional transfers/mobility. Current OT d/c recommendation Skilled Nursing Facility once medically appropriate.    Other factors to consider for discharge: family/social support, DME, time since onset, severity of deficits, functional baseline     Patient will benefit from skilled therapy intervention to address the above noted impairments.       PLAN :  Recommendations and Planned Interventions: self care training, functional mobility  training, therapeutic exercise, balance training, therapeutic activities, endurance activities, and patient education    Recommend with staff: Out of bed to chair for meals    Recommend next session: Toileting, UB dressing, LB dressing , and Seated grooming    Frequency/Duration: Patient will be followed by occupational therapy:  3-5x/week to address goals.    Recommendation for discharge: (in order for the patient to meet his/her long term goals)  Skilled Nursing Facility    This discharge recommendation:  Has been made in collaboration with the attending provider and/or case management    IF patient discharges home will need the following DME: bedside commode, shower chair, and walker: rolling       SUBJECTIVE:   Patient stated "I want this to stop hurting."    OBJECTIVE DATA SUMMARY:   HISTORY:   Past Medical History:   Diagnosis Date    Chronic obstructive pulmonary disease (HCC)     Hypercholesteremia     Insomnia     Sleep disorder      No past surgical history on file.    Per patient:   Home Situation  Home Environment: Skilled nursing facility  Care Facility Name: [facility]  # Steps to Enter: 0  One/Two Story Residence: One story  Living Alone: Yes  Support Systems: Skilled Nursing Facility  Patient Expects to be Discharged to:: Skilled nursing facility  Current DME Used/Available at Home: Environmental consultant, Control and instrumentation engineer dominance: Right    EXAMINATION OF PERFORMANCE DEFICITS:  Cognitive/Behavioral Status:  Neurologic State: Alert  Orientation Level: Oriented X4  Cognition: Decreased attention/concentration;Follows commands  Hearing:  Auditory  Auditory Impairment: Hard of hearing, bilateral      Range of Motion:  AROM: Generally decreased, functional                         Strength:  Strength: Generally decreased, functional                Coordination:               Tone & Sensation:  Tone: Normal  Sensation: Intact                      Balance:  Sitting: With  support;Intact  Standing: Impaired;Pull to stand;With support  Standing - Static: Poor;Constant support  Standing - Dynamic : Poor;Constant support    Functional Mobility and Transfers for ADLs:  Bed Mobility:  Rolling: Moderate assistance  Supine to Sit: Moderate assistance  Sit to Supine: Maximum assistance  Scooting: Moderate assistance;Maximum assistance          ADL Intervention and task modifications:                           Lower Body Dressing Assistance  Socks: Maximum assistance    Toileting  Bowel Hygiene: Maximum assistance  Clothing Management: Maximum assistance         Functional Measure:    [employer] AM-PACTM 6 Clicks                                                       Daily Activity Inpatient Short Form  How much help from another person does the patient currently need... Total; A Lot A Little None   1.  Putting on and taking off regular lower body clothing? [x]   1 []   2 []   3 []   4   2.  Bathing (including washing, rinsing, drying)? []   1 [x]   2 []   3 []   4   3.  Toileting, which includes using toilet, bedpan or urinal? []  1 [x]   2 []   3 []   4   4.  Putting on and taking off regular upper body clothing? []   1 [x]   2 []   3 []   4   5.  Taking care of personal grooming such as brushing teeth? []   1 []   2 [x]   3 []   4   6.  Eating meals? []   1 []   2 [x]   3 []   4    2007, Trustees of [address], under license to [place], [place]. All rights reserved     Score: 13/24     Interpretation of Tool:  Represents clinically-significant functional categories (i.e. Activities of daily living).  Percentage of Impairment CH    0%   CI    1-19% CJ    20-39% CK    40-59% CL    60-79% CM    80-99% CN     100%   AMPAC  Score 6-24 24 23  20-22 15-19 10-14 7-9 6     Occupational Therapy Evaluation Charge Determination   History Examination Decision-Making   LOW Complexity : Brief history review  LOW Complexity : 1-3 performance deficits relating to physical, cognitive , or psychosocial skils that result  in  activity limitations and / or participation restrictions  MEDIUM Complexity : Patient may present with comorbidities that affect occupational performnce. Miniml to moderate modification of tasks or assistance (eg, physical or verbal ) with assesment(s) is necessary to enable patient to complete evaluation       Based on the above components, the patient evaluation is determined to be of the following complexity level: LOW   Pain Rating:  10/10: RN notified    Activity Tolerance:   Poor    After treatment patient left in no apparent distress:    Supine in bed, Call bell within reach, Bed / chair alarm activated, and Side rails x 3, call bell within reach    COMMUNICATION/EDUCATION:   The patient's plan of care was discussed with: Physical therapist and Registered nurse.     Patient/family have participated as able in goal setting and plan of care. and Patient/family agree to work toward stated goals and plan of care.    This patient's plan of care is appropriate for delegation to OTA.     PT/OT sessions occurred together for increased safety of pt and clinician.     Seen by Halford Keller, OT but documented under Adelina Drones, OT    Thank you for this referral.  Adelina Drones, OT  Time Calculation: 15 mins   Problem: Self Care Deficits Care Plan (Adult)  Goal: *Acute Goals and Plan of Care (Insert Text)  Description: FUNCTIONAL STATUS PRIOR TO ADMISSION: Per patient, she was Ind with functional transfers and ADLs with no AD. A friend drove her to appointments.      HOME SUPPORT: Lives alone, a friend is available to assist.     Occupational Therapy Goals  Initiated 01/27/2021    Pt stated goal I want this to stop hurting.  Pt will be Min A sup <> sit in prep for EOB ADLs  Pt will be Setup grooming sitting sink side LRAD  Pt will be Set-up UB dressing sitting EOB/long sit   Pt will be Mod A  LE dressing sitting EOB/long sit  Pt will be Min A  sit <> stand in prep for toileting LRAD  Pt will be Mod A  toileting/toilet transfer/cloth mgmt LRAD    01/27/2021 1555 by Drones Adelina, OT  Outcome: Not Met

## 2021-01-27 NOTE — Progress Notes (Signed)
Progress Notes by Moody Bruins, PA-C at 01/27/21 1128                Author: Moody Bruins, PA-C  Service: Physician Assistant  Author Type: Physician Assistant       Filed: 01/27/21 1132  Date of Service: 01/27/21 1128  Status: Signed           Editor: Lenny Pastel (Physician Assistant)  Cosigner: Darlina Rumpf, MD at 01/28/21 1329                    Orthopedic progress note      Date:01/27/2021       Room:562/01   Patient Name:[name]     Date of Birth:August 04, 1945     Age:76 y.o.            Subjective       76 year old female status post left hip hemiarthroplasty.  Postop day #1.   Patient doing well.  She is complaining mainly of pain of her left knee.  No pain of her left hip at this time.   She participate and tolerate therapy well today.   No other complaints at this time.   Objective                      Vitals Last 24 Hours:   TEMPERATURE:  Temp  Avg: 98.4 ??F (36.9 ??C)  Min: 97.9 ??F (36.6 ??C)  Max: 99 ??F (37.2 ??C)   RESPIRATIONS RANGE: Resp  Avg: 16.9  Min: 15  Max: 18   PULSE OXIMETRY RANGE: SpO2  Avg: 94.7 %  Min: 91 %  Max: 96 %   PULSE RANGE: Pulse  Avg: 99.1  Min: 89  Max: 106   BLOOD PRESSURE RANGE: Systolic (25KNL), ZJQ:734 , Min:95 , LPF:790    ; Diastolic (24OXB), DZH:29, Min:68, Max:85        Current Facility-Administered Medications          Medication  Dose  Route  Frequency           ?  albuterol (PROVENTIL HFA, VENTOLIN HFA, PROAIR HFA) inhaler 2 Puff   2 Puff  Inhalation  Q6H PRN     ?  atorvastatin (LIPITOR) tablet 40 mg   40 mg  Oral  DAILY     ?  budesonide-formoteroL (SYMBICORT) 160-4.5 mcg/actuation HFA inhaler 2 Puff   2 Puff  Inhalation  BID RT          And           ?  tiotropium bromide (SPIRIVA RESPIMAT) 2.5 mcg /actuation   2 Puff  Inhalation  DAILY           ?  metoprolol succinate (TOPROL-XL) XL tablet 25 mg   25 mg  Oral  DAILY     ?  pantoprazole (PROTONIX) tablet 40 mg   40 mg  Oral  ACB     ?  hydrOXYzine pamoate (VISTARIL) capsule 25 mg   25 mg   Oral  TID PRN     ?  0.9% sodium chloride infusion   125 mL/hr  IntraVENous  CONTINUOUS     ?  sodium chloride 0.9 % bolus infusion 500 mL   500 mL  IntraVENous  ONCE PRN     ?  sodium chloride (NS) flush 5-40 mL   5-40 mL  IntraVENous  PRN     ?  acetaminophen (TYLENOL) tablet  650 mg   650 mg  Oral  Q6H PRN     ?  traMADoL (ULTRAM) tablet 50 mg   50 mg  Oral  Q6H PRN     ?  HYDROmorphone (DILAUDID) syringe 0.5 mg   0.5 mg  IntraVENous  Q4H PRN     ?  naloxone (NARCAN) injection 0.4 mg   0.4 mg  IntraVENous  PRN     ?  ceFAZolin (ANCEF) 2 g in sterile water (preservative free) 20 mL IV syringe   2 g  IntraVENous  Q8H     ?  ondansetron (ZOFRAN) injection 4 mg   4 mg  IntraVENous  Q4H PRN     ?  senna-docusate (PERICOLACE) 8.6-50 mg per tablet 1 Tablet   1 Tablet  Oral  BID     ?  polyethylene glycol (MIRALAX) packet 17 g   17 g  Oral  DAILY     ?  [START ON 01/28/2021] bisacodyL (DULCOLAX) suppository 10 mg   10 mg  Rectal  DAILY PRN           ?  enoxaparin (LOVENOX) injection 40 mg   40 mg  SubCUTAneous  DAILY           ?  0.9% sodium chloride infusion   75 mL/hr  IntraVENous  CONTINUOUS     ?  sodium chloride (NS) flush 5-40 mL   5-40 mL  IntraVENous  PRN     ?  naloxone (NARCAN) injection 0.4 mg   0.4 mg  IntraVENous  PRN     ?  acetaminophen (TYLENOL) tablet 650 mg   650 mg  Oral  Q6H PRN          Or           ?  acetaminophen (TYLENOL) suppository 650 mg   650 mg  Rectal  Q6H PRN     ?  polyethylene glycol (MIRALAX) packet 17 g   17 g  Oral  DAILY PRN     ?  ondansetron (ZOFRAN ODT) tablet 4 mg   4 mg  Oral  Q8H PRN          Or           ?  ondansetron (ZOFRAN) injection 4 mg   4 mg  IntraVENous  Q6H PRN     ?  oxyCODONE IR (ROXICODONE) tablet 5 mg   5 mg  Oral  Q4H PRN     ?  hydrALAZINE (APRESOLINE) 20 mg/mL injection 10 mg   10 mg  IntraVENous  Q6H PRN     ?  melatonin tablet 9 mg   9 mg  Oral  QHS PRN           ?  morphine injection 1 mg   1 mg  IntraVENous  Q4H PRN         Review of Systems     Constitutional: Negative for malaise/fatigue.    Respiratory: Negative for cough, shortness of breath and wheezing.     Cardiovascular: Negative for chest pain and palpitations.    Gastrointestinal: Negative for abdominal pain, heartburn, nausea and vomiting.    Neurological: Negative for headaches.    Musculoskeletal: Denies any numbness/tingling of operative extremity       I/O (24Hr):      Intake/Output Summary (Last 24 hours) at 01/27/2021 1129   Last data filed at 01/26/2021 1916     Gross per 24 hour  Intake  860 ml        Output  200 ml        Net  660 ml        Objective:   Vital signs: (most recent): Blood pressure 105/69, pulse (!) 106, temperature 99 ??F (37.2 ??C), resp. rate 18, height 5' 3"  (1.6 m), weight 45.8 kg (101 lb), SpO2 96 %.        Labs/Imaging/Diagnostics       Labs:   CBC:     Recent Labs              01/27/21   0809  01/27/21   0808  01/26/21   0430  01/25/21   1608     WBC   --   17.5*  12.5*  9.7     RBC   --   3.56*  3.61*  3.84     HGB  10.9*  11.0*  11.1*  11.9     HCT   --   34.3*  35.0  37.0     MCV   --   96.3  97.0  96.4     RDW   --   12.6  12.4  12.5           PLT   --   387  377  410*        CHEMISTRIES:     Recent Labs             01/27/21   0808  01/26/21   0430  01/25/21   1608     NA  135*  140  139     K  4.4  4.1  4.2     CL  104  108  108     CO2  20*  23  24     BUN  20  20  20      CREA  0.87  0.71  0.92          CA  9.0  9.0  9.2     PT/INR:     Recent Labs           01/25/21   1608        INR  1.0        APTT:No results for input(s): APTT in the last 72 hours.   LIVER PROFILE:     Recent Labs            01/27/21   0808  01/25/21   1608     AST  20  19         ALT  18  23          Lab Results         Component  Value  Date/Time            ALT (SGPT)  18  01/27/2021 08:08 AM       AST (SGOT)  20  01/27/2021 08:08 AM       Alk. phosphatase  93  01/27/2021 08:08 AM            Bilirubin, total  0.5  01/27/2021 08:08 AM           Physical Exam:   Left hip: Pico dressing in  place.  Clean dry and intact.  Sensation intact throughout left lower extremity.  No calf pain to palpation.  There is +1 nonpitting edema seen of the lower extremities.  DP pulses faint but palpable.  Cap refill is 2 seconds.  EHL/DF/PF is 5 out of 5.  Left lower extremity neurovascular intact.      Assessment//Plan                        Patient Active Problem List           Diagnosis  Date Noted         ?  Closed fracture of neck of left femur ([place])  01/26/2021     ?  Hip fracture ([place])  01/25/2021     ?  Sepsis ([place])  01/02/2021     ?  Cardiac syncope  11/27/2020         ?  Ambulatory dysfunction  11/27/2020        Status post left hip hemiarthroplasty.  Postop day #1.   Continue with therapy as tolerated.   Exercises encouraged.   Awaiting rehab placement.   Anticipate patient be ready for discharge by tomorrow.         Electronically signed by Moody Bruins, PA-C on 01/27/2021 at 11:29 AM

## 2021-01-28 LAB — METABOLIC PANEL, COMPREHENSIVE
A-G Ratio: 0.8 — ABNORMAL LOW (ref 1.1–2.2)
ALT (SGPT): 16 U/L (ref 12–78)
AST (SGOT): 22 U/L (ref 15–37)
Albumin: 3.1 g/dL — ABNORMAL LOW (ref 3.5–5.0)
Alk. phosphatase: 102 U/L (ref 45–117)
Anion gap: 8 mmol/L (ref 5–15)
BUN/Creatinine ratio: 20 (ref 12–20)
BUN: 18 mg/dL (ref 6–20)
Bilirubin, total: 0.6 mg/dL (ref 0.2–1.0)
CO2: 23 mmol/L (ref 21–32)
Calcium: 9 mg/dL (ref 8.5–10.1)
Chloride: 103 mmol/L (ref 97–108)
Creatinine: 0.92 mg/dL (ref 0.55–1.02)
Globulin: 3.9 g/dL (ref 2.0–4.0)
Glucose: 107 mg/dL — ABNORMAL HIGH (ref 65–100)
Potassium: 4.1 mmol/L (ref 3.5–5.1)
Protein, total: 7 g/dL (ref 6.4–8.2)
Sodium: 134 mmol/L — ABNORMAL LOW (ref 136–145)
eGFR: >60 mL/min/{1.73_m2} (ref >60)

## 2021-01-28 LAB — CBC WITH AUTOMATED DIFF
ABS. BASOPHILS: 0.1 10*3/uL (ref 0.0–0.1)
ABS. EOSINOPHILS: 0.7 10*3/uL — ABNORMAL HIGH (ref 0.0–0.4)
ABS. IMM. GRANS.: 0.1 10*3/uL — ABNORMAL HIGH (ref 0.00–0.04)
ABS. LYMPHOCYTES: 2.6 10*3/uL (ref 0.8–3.5)
ABS. MONOCYTES: 1.1 10*3/uL — ABNORMAL HIGH (ref 0.0–1.0)
ABS. NEUTROPHILS: 12.3 10*3/uL — ABNORMAL HIGH (ref 1.8–8.0)
ABSOLUTE NRBC: 0 10*3/uL (ref 0.00–0.01)
BASOPHILS: 0 % (ref 0–1)
EOSINOPHILS: 4 % (ref 0–7)
HCT: 34.2 % — ABNORMAL LOW (ref 35.0–47.0)
HGB: 11.1 g/dL — ABNORMAL LOW (ref 11.5–16.0)
IMMATURE GRANULOCYTES: 0 % (ref 0–0.5)
LYMPHOCYTES: 15 % (ref 12–49)
MCH: 31.4 PG (ref 26.0–34.0)
MCHC: 32.5 g/dL (ref 30.0–36.5)
MCV: 96.6 FL (ref 80.0–99.0)
MONOCYTES: 7 % (ref 5–13)
MPV: 9.3 FL (ref 8.9–12.9)
NEUTROPHILS: 74 % (ref 32–75)
NRBC: 0 PER 100 WBC
PLATELET: 311 10*3/uL (ref 150–400)
RBC: 3.54 M/uL — ABNORMAL LOW (ref 3.80–5.20)
RDW: 12.9 % (ref 11.5–14.5)
WBC: 16.8 10*3/uL — ABNORMAL HIGH (ref 3.6–11.0)

## 2021-01-28 LAB — CBC WITH AUTO DIFFERENTIAL
Basophils %: 0 % (ref 0–1)
Basophils Absolute: 0.1 10*3/uL (ref 0.0–0.1)
Eosinophils %: 4 % (ref 0–7)
Eosinophils Absolute: 0.7 10*3/uL — ABNORMAL HIGH (ref 0.0–0.4)
Granulocyte Absolute Count: 0.1 10*3/uL — ABNORMAL HIGH (ref 0.00–0.04)
Hematocrit: 34.2 % — ABNORMAL LOW (ref 35.0–47.0)
Hemoglobin: 11.1 g/dL — ABNORMAL LOW (ref 11.5–16.0)
Immature Granulocytes: 0 % (ref 0–0.5)
Lymphocytes %: 15 % (ref 12–49)
Lymphocytes Absolute: 2.6 10*3/uL (ref 0.8–3.5)
MCH: 31.4 PG (ref 26.0–34.0)
MCHC: 32.5 g/dL (ref 30.0–36.5)
MCV: 96.6 FL (ref 80.0–99.0)
MPV: 9.3 FL (ref 8.9–12.9)
Monocytes %: 7 % (ref 5–13)
Monocytes Absolute: 1.1 10*3/uL — ABNORMAL HIGH (ref 0.0–1.0)
NRBC Absolute: 0 10*3/uL (ref 0.00–0.01)
Neutrophils %: 74 % (ref 32–75)
Neutrophils Absolute: 12.3 10*3/uL — ABNORMAL HIGH (ref 1.8–8.0)
Nucleated RBCs: 0 PER 100 WBC
Platelets: 311 10*3/uL (ref 150–400)
RBC: 3.54 M/uL — ABNORMAL LOW (ref 3.80–5.20)
RDW: 12.9 % (ref 11.5–14.5)
WBC: 16.8 10*3/uL — ABNORMAL HIGH (ref 3.6–11.0)

## 2021-01-28 LAB — COMPREHENSIVE METABOLIC PANEL
ALT: 16 U/L (ref 12–78)
AST: 22 U/L (ref 15–37)
Albumin/Globulin Ratio: 0.8 — ABNORMAL LOW (ref 1.1–2.2)
Albumin: 3.1 g/dL — ABNORMAL LOW (ref 3.5–5.0)
Alkaline Phosphatase: 102 U/L (ref 45–117)
Anion Gap: 8 mmol/L (ref 5–15)
BUN: 18 mg/dL (ref 6–20)
Bun/Cre Ratio: 20 (ref 12–20)
CO2: 23 mmol/L (ref 21–32)
Calcium: 9 mg/dL (ref 8.5–10.1)
Chloride: 103 mmol/L (ref 97–108)
Creatinine: 0.92 mg/dL (ref 0.55–1.02)
ESTIMATED GLOMERULAR FILTRATION RATE: >60 mL/min/{1.73_m2} (ref >60)
Globulin: 3.9 g/dL (ref 2.0–4.0)
Glucose: 107 mg/dL — ABNORMAL HIGH (ref 65–100)
Potassium: 4.1 mmol/L (ref 3.5–5.1)
Sodium: 134 mmol/L — ABNORMAL LOW (ref 136–145)
Total Bilirubin: 0.6 mg/dL (ref 0.2–1.0)
Total Protein: 7 g/dL (ref 6.4–8.2)

## 2021-01-28 MED ORDER — POLYETHYLENE GLYCOL 3350 17 GRAM (100 %) ORAL POWDER PACKET
17 gram | Freq: Every day | ORAL | 0 refills | Status: AC
Start: 2021-01-28 — End: ?

## 2021-01-28 MED ORDER — SENNOSIDES-DOCUSATE SODIUM 8.6 MG-50 MG TAB
ORAL_TABLET | Freq: Two times a day (BID) | ORAL | 0 refills | Status: AC
Start: 2021-01-28 — End: ?

## 2021-01-28 MED ORDER — OXYCODONE 5 MG TAB
5 mg | ORAL_TABLET | Freq: Four times a day (QID) | ORAL | 0 refills | Status: AC | PRN
Start: 2021-01-28 — End: 2021-01-31

## 2021-01-28 MED ORDER — SODIUM CHLORIDE 0.9% BOLUS IV
0.9 % | Freq: Once | INTRAVENOUS | Status: AC
Start: 2021-01-28 — End: 2021-01-27
  Administered 2021-01-28: 03:00:00 via INTRAVENOUS

## 2021-01-28 MED FILL — BUDESONIDE-FORMOTEROL HFA 160 MCG-4.5 MCG/ACTUATION AEROSOL INHALER: RESPIRATORY_TRACT | Qty: 10.2

## 2021-01-28 MED FILL — ATORVASTATIN 40 MG TAB: 40 mg | ORAL | Qty: 1

## 2021-01-28 MED FILL — METOPROLOL SUCCINATE SR 25 MG 24 HR TAB: 25 mg | ORAL | Qty: 1

## 2021-01-28 MED FILL — PANTOPRAZOLE 40 MG TAB, DELAYED RELEASE: 40 mg | ORAL | Qty: 1

## 2021-01-28 MED FILL — SODIUM CHLORIDE 0.9 % IV: INTRAVENOUS | Qty: 500

## 2021-01-28 MED FILL — SPIRIVA RESPIMAT 2.5 MCG/ACTUATION SOLUTION FOR INHALATION: 2.5 mcg/actuation | RESPIRATORY_TRACT | Qty: 16

## 2021-01-28 MED FILL — POLYETHYLENE GLYCOL 3350 17 GRAM (100 %) ORAL POWDER PACKET: 17 gram | ORAL | Qty: 1

## 2021-01-28 MED FILL — ENOXAPARIN 40 MG/0.4 ML SUB-Q SYRINGE: 40 mg/0.4 mL | SUBCUTANEOUS | Qty: 0.4

## 2021-01-28 MED FILL — MELATONIN 3 MG TAB: 3 mg | ORAL | Qty: 3

## 2021-01-28 MED FILL — STIMULANT LAXATIVE PLUS 8.6 MG-50 MG TABLET: ORAL | Qty: 1

## 2021-01-28 NOTE — Discharge Summary (Signed)
Discharge Summary by Marton Redwood, PA at 01/28/21 1442                Author: Marton Redwood, PA  Service: Internal Medicine  Author Type: Physician Assistant       Filed: 01/28/21 1516  Date of Service: 01/28/21 1442  Status: Signed           Editor: Marton Redwood, PA (Physician Assistant)  Cosigner: Lieutenant Diego, MD at 01/29/21 1826                       Admit date: 01/25/2021    Admitting Provider: Mir Dena Billet, MD      Discharge date: 01/28/2021   Discharging Provider: Marton Redwood, PA         * Admission Diagnoses: Hip fracture (HCC) [S72.009A]      * Discharge Diagnoses:        Hospital Problems as of 01/28/2021  Date Reviewed: 01/26/2021                            Codes  Class  Noted - Resolved  POA              Closed fracture of neck of left femur (HCC)  ICD-10-CM: W43.154M   ICD-9-CM: 820.8    01/26/2021 - Present  Unknown                        Hip fracture [facility])  ICD-10-CM: G86.761P   ICD-9-CM: 820.8    01/25/2021 - Present  Unknown                     * Hospital Course:       [name] is a 76 year old female with a PMH of COPD and HLD who presented after a GLF from [place] on her left hip. In ED vitals stable. XR of left hip showed displaced femoral neck fracture. Initial labs non-significant. Patient admitted  for left femoral fracture management. Ortho consulted. CT of left hip showed left femoral neck subcapital fracture with proximal migration. ORIF of left hip on 1/4. Patient tolerated procedure well.  PT/OT recommending SNF. Patient required O2 after operation  but was weaned off to room air. Patient does qualify for ambulating O2 dropping to 85% on room air while ambulating. Patient to follow-up with Ortho and PCP within 2 weeks from discharge. All questions answered at this time.      * Procedures:    Procedure(s):   Left Hip Hemi-Arthroplasty, Anterior Approach         Consults:    Ortho      Significant Diagnostic Studies: As discussed in hospital  course      Discharge Exam:   Visit Vitals      BP  107/60 (BP 1 Location: Right upper arm, BP Patient Position: At rest)     Pulse  (!) 119     Temp  99.6 ??F (37.6 ??C)     Resp  24     Ht  5\' 3"  (1.6 m)     Wt  45.8 kg (101 lb)     SpO2  94%        BMI  17.89 kg/m??           PHYSICAL EXAM:   Vitals and nursing note reviewed.    Constitutional:  Appearance: She is ill-appearing.       Comments: Chronically ill-appearing female but pleasant    HENT:       Head: Normocephalic and atraumatic.       Mouth/Throat:       Comments: Poor dentition   Cardiovascular:       Rate and Rhythm: Normal rate and regular rhythm.    Pulmonary:       Effort: No respiratory distress.       Breath sounds: No wheezing.    Abdominal:       General: Bowel sounds are normal. There is no distension.       Palpations: Abdomen is soft.       Tenderness: There is no abdominal tenderness.    Musculoskeletal:       Right lower leg: No edema.       Left lower leg: No edema.       Comments: LLE:  Pico dressing in place. Clean dry and intact    Skin:      General: Skin is warm.       Capillary Refill: Capillary refill takes less than 2 seconds.    Neurological:       Mental Status: She is alert and oriented to person, place, and time.    Psychiatric:          Mood and Affect: Mood normal.          * Discharge Condition: improved   * Disposition: Skilled Nursing Facility (SNF)      Discharge Medications:     Current Discharge Medication List                 START taking these medications          Details        polyethylene glycol (MIRALAX) 17 gram packet  Take 1 Packet by mouth daily.   Qty: 30 Each, Refills: 0   Start date: 01/29/2021               senna-docusate (PERICOLACE) 8.6-50 mg per tablet  Take 1 Tablet by mouth two (2) times a day.   Qty: 60 Tablet, Refills: 0   Start date: 01/28/2021               oxyCODONE IR (ROXICODONE) 5 mg immediate release tablet  Take 1 Tablet by mouth every six (6) hours as needed for Pain for up to 3 days. Max  Daily Amount: 20 mg.   Qty: 12 Tablet, Refills: 0   Start date: 01/28/2021, End date: 01/31/2021          Associated Diagnoses: Left displaced femoral neck fracture (HCC)                        CONTINUE these medications which have NOT CHANGED          Details        amitriptyline (ELAVIL) 75 mg tablet  Take 1 Tablet by mouth nightly for 30 days.   Qty: 30 Tablet, Refills: 0               acetaminophen (TYLENOL) 325 mg tablet  Take 325 mg by mouth every four (4) hours as needed for Pain (Take 2 tablets by mouth as needed for pain).               atorvastatin (LIPITOR) 40 mg tablet  Take 40 mg by mouth daily.  metoprolol succinate (TOPROL-XL) 25 mg XL tablet  Take 25 mg by mouth daily.               fluticasone-umeclidin-vilanter (TRELEGY ELLIPTA) 200-62.5-25 mcg inhaler  Trelegy Ellipta 200 mcg-62.5 mcg-25 mcg powder for inhalation    INHALE 1 PUFF BY MOUTH ONCE A DAY               albuterol (PROVENTIL HFA, VENTOLIN HFA, PROAIR HFA) 90 mcg/actuation inhaler  albuterol sulfate HFA 90 mcg/actuation aerosol inhaler    INHALE 2 PUFFS EVERY 6 HOURS AS NEEDED               omeprazole (PRILOSEC) 20 mg capsule  Take 20 mg by mouth daily.                         * Follow-up Care/Patient Instructions:   Activity: PT/OT Eval and Treat   Diet: Cardiac Diet   Wound Care: Keep wound clean and dry, Reinforce dressing PRN, and Ice to area for comfort        Follow-up Information                  Follow up With  Specialties  Details  Why  Contact Info              Singaraju, Tonie Griffith, MD  Orthopedic Surgery, Sports Medicine Physician  Schedule an appointment as soon as possible for a visit in 2 week(s)  Post-hospitalization follow-up  [address]   [address] [place] [address]   [phone]                 Cioflec, Doran Durand, MD  Internal Medicine Physician  Schedule an appointment as soon as possible for a visit in 2 week(s)  Post-hospitalization follow-up  [address]   [address]   [place]  [place] [address]   [phone]                        Discharge summary greater than 35 minutes spent with the patient performing discharge instructions, medication review and physical exam      Signed:   Marton Redwood, PA   01/28/2021   2:42 PM      Time spent: 30 minutes

## 2021-01-28 NOTE — Progress Notes (Signed)
SpO2 on room air, at rest=95%  SpO2 ,on room air, while ambulating=85%.  SpO2 on 2L, while ambulating=92%.

## 2021-01-28 NOTE — Progress Notes (Deleted)
OCCUPATIONAL THERAPY TREATMENT  Patient: [name] (76 y.o. female)  Date: 01/28/2021  Diagnosis: Hip fracture (HCC) [S72.009A] <principal problem not specified>  Procedure(s) (LRB):  Left Hip Hemi-Arthroplasty, Anterior Approach (Left) 2 Days Post-Op  Precautions:    Chart, occupational therapy assessment, plan of care, and goals were reviewed.    ASSESSMENT  Pt continues with skilled OT/PT services and is progressing towards goals. Pt received semi-supine in bed upon arrival, AXO x4 and agreeable to COTA/PTA tx at this time. Overall, pt continues to present with deficits in generalized strength/AROM, coordination, bed mobility, static/dynamic sitting and standing balance and functional activity tolerance during performance of ADLs/mobility (see below for objective details and assist levels). Pt tolerated session poor. Pt required extended time and freq vc's for proper breathing techniques. Pt completed light grooming semi supine in bed w/ set up. Pt supine to sitting eob w/ mod A x2 and  sitting to supine w/ Max a x 2, extended time time required w/ all mobility. Pt completed 1 stand w/ rw and gait belt and MAX A x 2. Pt stood for ~ 30 seconds.  Pt total A x 2 w/ moving to hob.   Will continue to progress. Recommend d/c to Skilled Nursing Facility once medically appropriate.     Other factors to consider for discharge: PLOF, time since onset, severity of deficit.         PLAN :  Patient continues to benefit from skilled intervention to address the above impairments.  Continue treatment per established plan of care.  to address goals.    Recommend with staff: Encourage HEP in prep for ADLs/mobility    Recommend next session: Seated grooming    Recommendation for discharge: (in order for the patient to meet his/her long term goals)  Skilled Nursing Facility    This discharge recommendation:  Has been made in collaboration with the attending provider and/or case management    IF patient discharges home will need  the following DME: TBD       SUBJECTIVE:   Patient stated "good luck with that."    OBJECTIVE DATA SUMMARY:   Cognitive/Behavioral Status:  Neurologic State: Alert  Orientation Level: Oriented X4  Cognition: Follows commands    Functional Mobility and Transfers for ADLs:  Bed Mobility:  Rolling: Moderate assistance  Supine to Sit: Assist x2;Moderate assistance  Sit to Supine: Maximum assistance;Assist x2  Scooting: Maximum assistance    Transfers:  Sit to Stand: Additional time;Adaptive equipment;Maximum assistance;Assist x2    Balance:  Sitting: Intact;With support  Standing: Impaired;Pull to stand;With support  Standing - Static: Constant support;Poor  Standing - Dynamic : Poor;Constant support    ADL Intervention:    Grooming  Position Performed: Long sitting on bed  Washing Face: Set-up  Washing Hands: Set-up    Pain:  9/10 L hip/knee    Activity Tolerance:   Poor and requires rest breaks    After treatment patient left in no apparent distress:   Supine in bed, Heels elevated for pressure relief, Call bell within reach, Bed / chair alarm activated, and Side rails x 3, bed locked and in lowest position    COMMUNICATION/COLLABORATION:   The patient's plan of care was discussed with: Physical therapy assistant and Registered nurse.   PT/OT sessions occurred together for increased safety of pt and clinician.     Susann Givens, OTA  Time Calculation: 38 mins     Problem: Self Care Deficits Care Plan (Adult)  Goal: *Acute Goals and  Plan of Care (Insert Text)  Description: FUNCTIONAL STATUS PRIOR TO ADMISSION: Per patient, she was Ind with functional transfers and ADLs with no AD. A friend drove her to appointments.      HOME SUPPORT: Lives alone, a friend is available to assist.     Occupational Therapy Goals  Initiated 01/27/2021    Pt stated goal "I want this to stop hurting."  Pt will be Min A sup <> sit in prep for EOB ADLs  Pt will be Setup grooming sitting sink side LRAD  Pt will be Set-up UB dressing sitting  EOB/long sit   Pt will be Mod A  LE dressing sitting EOB/long sit  Pt will be Min A  sit <> stand in prep for toileting LRAD  Pt will be Mod A toileting/toilet transfer/cloth mgmt LRAD    Outcome: Progressing Towards Goal

## 2021-01-28 NOTE — Progress Notes (Signed)
Patient accepted to go to [facility].    Per hospitalist note 1/5, possible DC in 24-48hrs.    CM submitted a request to [facility] to start auth. All documents faxed to [phone].    -------------    1130- CM received a call from [place] with [employer] dept informed CM that Berkley Harvey has been approved. Navi ID# [id number]. Approved from 1/6-1/10. Abbott Pao is the Care Coordinator. Auth # [id number].    CM notified Audie Box, PA via perfectserve.    CM notified SNF and awaiting room number just in case patient is cleared to DC today.    --------------    1210- room # to SNF received. Awaiting medical clearance.

## 2021-01-28 NOTE — Progress Notes (Signed)
Progress Notes by Moody Bruins, PA-C at 01/28/21 1144                Author: Moody Bruins, PA-C  Service: Physician Assistant  Author Type: Physician Assistant       Filed: 01/28/21 1151  Date of Service: 01/28/21 1144  Status: Signed           Editor: Lenny Pastel (Physician Assistant)  Cosigner: Darlina Rumpf, MD at 01/28/21 1331                    Orthopedic progress note      Date:01/28/2021       Room:562/01   Patient Name:[name]     Date of Birth:1945-11-16     Age:76 y.o.            Subjective       Status post left hip hemiarthroplasty.  Postop day #2.   Patient doing well.  She still complains of pain of the left knee and also complaining of painful left foot this morning.   She does feel the pain medication helped some.   No other complaints at this time.   Objective                      Vitals Last 24 Hours:   TEMPERATURE:  Temp  Avg: 98.1 ??F (36.7 ??C)  Min: 97.6 ??F (36.4 ??C)  Max: 98.2 ??F (36.8 ??C)   RESPIRATIONS RANGE: Resp  Avg: 19.5  Min: 18  Max: 24   PULSE OXIMETRY RANGE: SpO2  Avg: 95.3 %  Min: 94 %  Max: 97 %   PULSE RANGE: Pulse  Avg: 107  Min: 105  Max: 113   BLOOD PRESSURE RANGE: Systolic (88CZY), SAY:301 , Min:85 , SWF:093    ; Diastolic (23FTD), DUK:02, Min:52, Max:70        Current Facility-Administered Medications          Medication  Dose  Route  Frequency           ?  albuterol (PROVENTIL HFA, VENTOLIN HFA, PROAIR HFA) inhaler 2 Puff   2 Puff  Inhalation  Q6H PRN     ?  atorvastatin (LIPITOR) tablet 40 mg   40 mg  Oral  DAILY     ?  budesonide-formoteroL (SYMBICORT) 160-4.5 mcg/actuation HFA inhaler 2 Puff   2 Puff  Inhalation  BID RT          And           ?  tiotropium bromide (SPIRIVA RESPIMAT) 2.5 mcg /actuation   2 Puff  Inhalation  DAILY           ?  metoprolol succinate (TOPROL-XL) XL tablet 25 mg   25 mg  Oral  DAILY     ?  pantoprazole (PROTONIX) tablet 40 mg   40 mg  Oral  ACB     ?  hydrOXYzine pamoate (VISTARIL) capsule 25 mg   25 mg  Oral  TID  PRN     ?  sodium chloride (NS) flush 5-40 mL   5-40 mL  IntraVENous  PRN     ?  acetaminophen (TYLENOL) tablet 650 mg   650 mg  Oral  Q6H PRN     ?  traMADoL (ULTRAM) tablet 50 mg   50 mg  Oral  Q6H PRN     ?  naloxone (NARCAN) injection 0.4 mg  0.4 mg  IntraVENous  PRN     ?  senna-docusate (PERICOLACE) 8.6-50 mg per tablet 1 Tablet   1 Tablet  Oral  BID     ?  polyethylene glycol (MIRALAX) packet 17 g   17 g  Oral  DAILY     ?  bisacodyL (DULCOLAX) suppository 10 mg   10 mg  Rectal  DAILY PRN     ?  enoxaparin (LOVENOX) injection 40 mg   40 mg  SubCUTAneous  DAILY     ?  0.9% sodium chloride infusion   75 mL/hr  IntraVENous  CONTINUOUS     ?  sodium chloride (NS) flush 5-40 mL   5-40 mL  IntraVENous  PRN     ?  naloxone (NARCAN) injection 0.4 mg   0.4 mg  IntraVENous  PRN     ?  acetaminophen (TYLENOL) tablet 650 mg   650 mg  Oral  Q6H PRN          Or           ?  acetaminophen (TYLENOL) suppository 650 mg   650 mg  Rectal  Q6H PRN     ?  polyethylene glycol (MIRALAX) packet 17 g   17 g  Oral  DAILY PRN     ?  ondansetron (ZOFRAN ODT) tablet 4 mg   4 mg  Oral  Q8H PRN          Or           ?  ondansetron (ZOFRAN) injection 4 mg   4 mg  IntraVENous  Q6H PRN     ?  oxyCODONE IR (ROXICODONE) tablet 5 mg   5 mg  Oral  Q4H PRN     ?  hydrALAZINE (APRESOLINE) 20 mg/mL injection 10 mg   10 mg  IntraVENous  Q6H PRN           ?  melatonin tablet 9 mg   9 mg  Oral  QHS PRN               I/O (24Hr):      Intake/Output Summary (Last 24 hours) at 01/28/2021 1144   Last data filed at 01/28/2021 0725     Gross per 24 hour        Intake  --        Output  650 ml        Net  -650 ml        Objective:   Vital signs: (most recent): Blood pressure 125/70, pulse (!) 105, temperature 98.2 ??F (36.8 ??C), resp. rate 24, height 5' 3"  (1.6 m), weight 45.8 kg (101 lb), SpO2 95 %.        Labs/Imaging/Diagnostics       Labs:   CBC:     Recent Labs              01/28/21   0850  01/27/21   0809  01/27/21   0808  01/26/21   0430     WBC  16.8*   --    17.5*  12.5*     RBC  3.54*   --   3.56*  3.61*     HGB  11.1*  10.9*  11.0*  11.1*     HCT  34.2*   --   34.3*  35.0     MCV  96.6   --   96.3  97.0     RDW  12.9   --   12.6  12.4           PLT  311   --   387  377        CHEMISTRIES:     Recent Labs             01/28/21   0850  01/27/21   0808  01/26/21   0430     NA  134*  135*  140     K  4.1  4.4  4.1     CL  103  104  108     CO2  23  20*  23     BUN  18  20  20      CREA  0.92  0.87  0.71          CA  9.0  9.0  9.0     PT/INR:     Recent Labs           01/25/21   1608        INR  1.0        APTT:No results for input(s): APTT in the last 72 hours.   LIVER PROFILE:     Recent Labs             01/28/21   0850  01/27/21   0808  01/25/21   1608     AST  22  20  19           ALT  16  18  23           Lab Results         Component  Value  Date/Time            ALT (SGPT)  16  01/28/2021 08:50 AM       AST (SGOT)  22  01/28/2021 08:50 AM       Alk. phosphatase  102  01/28/2021 08:50 AM            Bilirubin, total  0.6  01/28/2021 08:50 AM           Physical Exam:   Left lower extremity: [place] dressing clean dry and intact.  No swelling to the left thigh.  No swelling seen the left knee left foot.  No erythema or ecchymosis seen of the left knee.  There is mild  tenderness palpation throughout the left knee.  No tenderness palpation throughout her left tibia.  The patient does hold her bilateral feet and equine position.  EHL/DF is 3 out of 5.  Negative Homans.  There is tenderness with forceful dorsiflexion  of the left foot.  Mild tenderness palpation throughout the left foot.  DP/PT pulses palpable.  Cap refill less than 2 seconds.  No calf pain to palpation.      X-rays taken of her left knee on 01/25/2021 showed no acute fractures.      Assessment//Plan                        Patient Active Problem List           Diagnosis  Date Noted         ?  Closed fracture of neck of left femur ([place])  01/26/2021     ?  Hip fracture ([place])  01/25/2021     ?  Sepsis ([place])   01/02/2021     ?  Cardiac syncope  11/27/2020         ?  Ambulatory dysfunction  11/27/2020        Status post left hip hemiarthroplasty.  Postop day #2.   Continue with therapy as tolerated.   We will continue to monitor her left knee and left foot pain.  No obvious signs of fracture based on x-ray.   Awaiting rehab placement   OK to discharge from orthopedics when cleared by medicine and placement made.   Follow up with Dr. Darryl Nestle as an outpatient in approx. 2 weeks.      Electronically signed by Moody Bruins, PA-C on 01/28/2021 at 11:44 AM

## 2021-01-28 NOTE — Progress Notes (Signed)
Report was called to receiving nurse at [facility]. All questions and concerns was addressed at this time.

## 2021-01-28 NOTE — Progress Notes (Signed)
Patient has been accepted to admit to [facility] located at [address], [place], [place] [address]. Patient will go to room 233A. Nurse to call report to [phone]. CM notified patient at bedside and she agrees with discharge at this time. Discharge plan of care/case management plan validated with provider discharge order.  Medicare pt has received, reviewed, and signed 2nd IM letter informing them of their right to appeal the discharge.  Signed copied has been placed on pt bedside chart. CM set up transport through [facility] and they will be here in 1-2 hours to pick up patient. CM notified primary nurse, secretary, and patient of above, all parties agree at this time.

## 2021-01-28 NOTE — Progress Notes (Signed)
VAT PICC team consult - PowerGlide Pro midline extended-dwell peripheral IV catheter (EDPIV) placement note (see vascular access properties and assessment for additional device data):     Verbal explanation and demonstration given as client education and patient was given opportunity to ask questions; client verbalized understanding, reinforcement required.     PowerGlide Pro 20 gauge 10 centimeter (cm) midline placed with ultrasound per policy due to no other suitable veins for US-guided PIV placement.     Site Location: left upper arm brachial vein  Blood return: yes; brisk blood return noted  Flushes easily with 10 mL NS.  Arm circumference: 19 cm    Client tolerated well, no complaints. No complications noted.    Dressed per policy with BARD PowerGlide Statlock stabilization device, CHG antimicrobial disk, and sterile Tegaderm.      01/28/21 1025   Extended Dwell Peripheral IV   Placement Date/Time: 01/28/21 1024   Present on Admission/Arrival: No  Inserted By: Tiburcio Bash, RN (US-guided)  Number of Attempts: 1  Insertion Practices: Chlorohexadine skin antisepsis;Hand hygiene;Optimal catheter site selection;Maximal ...   Criteria for Appropriate Use Limited/no vessel suitable for conventional peripheral access   Site Assessment Clean, dry, & intact   Phlebitis Assessment 0   Infiltration Assessment 0   Arm Circumference (cm) 19 cm   External Catheter Length (cm) 0 centimeters   Line Status Blood return noted;Brisk blood return;Capped;Flushed;Normal saline locked;Other (comment)  (Primary bag/tubing discarded for primary care nurse, Alcario Drought, to change with next dose due per hospital policy/procedure.)   Line Care Cap changed;Connections checked and tightened;Ports disinfected;Tubing changed   Alcohol Cap Used Yes   Date of Last Dressing Change 01/28/21   Dressing Type Disk with Chlorhexadine gluconate (CHG);Stabilization/securement device;Topical skin adhesive;Transparent   Dressing Status Clean, dry, &  intact;New   Dressing Intervention New dressing     PowerGlide Pro patient education handout with device information placed in chart.     Primary care nurse, Alcario Drought, notified.     LOT#: [id number]  Exp: 01/22/2022

## 2021-01-28 NOTE — Progress Notes (Signed)
 PHYSICAL THERAPY TREATMENT  Patient: [name] (76 y.o. female)  Date: 01/28/2021  Diagnosis: Hip fracture (HCC) [S72.009A] <principal problem not specified>  Procedure(s) (LRB):  Left Hip Hemi-Arthroplasty, Anterior Approach (Left) 2 Days Post-Op  Precautions:    Chart, physical therapy assessment, plan of care and goals were reviewed.    ASSESSMENT  Patient continues with skilled PT services and is progressing towards goals. Pt supine in bed upon PT arrival, agreeable to session. (See below for objective details and assist levels). Overall pt tolerated session fairly today. Patient on 1L SPO2 at start of session at 97%. O2 removed to test patients O2 needs with patient de stating to 93-95% on RW while sitting EOB. Patient then then performed STS to RW at max Ax2 and stood with max Ax2 to remain standing for 1 minute with patient demonstrating locked knees,posterior leaning and slightly flexed hips while standing. Patient unable to take or attempt steps at this time. Patients O2 dropped to 69% towards end of standing with patient reporting felling SOB. Patient returned to seated EOB where O2 recovered to 95% within 25 seconds with PLB.  Patient returned to supine in bed where patient was left supine with call bell within reach all needs meet and on RA with O2 remaining at 95% on RA.      Current Level of Function Impacting Discharge (mobility/balance): impaired balance, general weakness, poor activity tolerance, need for Ax2 for mobility.    Other factors to consider for discharge: PLOF, assistance at home, level of deficits,a cute medical state         PLAN :  Patient continues to benefit from skilled intervention to address the above impairments.  Continue treatment per established plan of care to address goals.      Recommendation for discharge: (in order for the patient to meet his/her long term goals)  Skilled Nursing Facility    This discharge recommendation:  Has been made in collaboration with the  attending provider and/or case management    IF patient discharges home will need the following DME: to be determined (TBD)       SUBJECTIVE:   Patient stated "I dont know how well im move but il try."    OBJECTIVE DATA SUMMARY:   Critical Behavior:  Neurologic State: Alert  Orientation Level: Oriented X4  Cognition: Follows commands     Functional Mobility Training:  Bed Mobility:  Rolling: Moderate assistance  Supine to Sit: Assist x2;Moderate assistance  Sit to Supine: Maximum assistance;Assist x2  Scooting: Maximum assistance  Transfers:  Sit to Stand: Additional time;Adaptive equipment;Maximum assistance;Assist x2  Stand to Sit: Maximum assistance;Assist x2  Balance:  Sitting: Intact;With support  Standing: Impaired;Pull to stand;With support  Standing - Static: Constant support;Poor  Standing - Dynamic : Poor;Constant support  Ambulation/Gait Training:  Left Side Weight Bearing: As tolerated     Pain Rating:  8/10 in Lt knee and hip     Activity Tolerance:   Fair and requires rest breaks    After treatment patient left in no apparent distress:   Bed locked and returned to lowest position, Supine in bed, Call bell within reach, Bed / chair alarm activated, and Side rails x 3      COMMUNICATION/COLLABORATION:   The patient's plan of care was discussed with: Occupational therapy assistant and Registered nurse. Treatment occurred with OT due to patient requiring Ax2 for mobility and safety.    GOALS:    Problem: Mobility Impaired (Adult and Pediatric)  Goal: *  Acute Goals and Plan of Care (Insert Text)  Description: Patient stated goal: feel better  Patient will move from supine to sit and sit to supine , scoot up and down, and roll side to side in bed with mod assist within 7 day(s).    Patient will transfer from bed to chair and chair to bed with moderate assistance  using the least restrictive device within 7 day(s).    Patient will improve static standing balance to moderate assistance within 1  week(s).  Patient will ambulate 10 feet with moderate assistance with least restrictive device within 1 weeks.     Outcome: Progressing Towards Goal       Donnice Marsh, PTA, PT   Time Calculation: 41 mins

## 2021-05-24 ENCOUNTER — Emergency Department: Admit: 2021-05-24 | Payer: MEDICARE | Primary: Internal Medicine

## 2021-05-24 ENCOUNTER — Inpatient Hospital Stay
Admit: 2021-05-24 | Discharge: 2021-05-28 | Disposition: A | Discharge status: Other Acute Inpatient Hospital | Payer: MEDICARE | Attending: Internal Medicine | Admitting: Internal Medicine

## 2021-05-24 ENCOUNTER — Inpatient Hospital Stay

## 2021-05-24 ENCOUNTER — Inpatient Hospital Stay
Admit: 2021-05-24 | Discharge: 2021-06-09 | Disposition: A | Discharge status: Home Health | Payer: PRIVATE HEALTH INSURANCE | Attending: Internal Medicine | Admitting: Internal Medicine

## 2021-05-24 DIAGNOSIS — S86912A Strain of unspecified muscle(s) and tendon(s) at lower leg level, left leg, initial encounter: Secondary | ICD-10-CM

## 2021-05-24 DIAGNOSIS — E7800 Pure hypercholesterolemia, unspecified: Secondary | ICD-10-CM

## 2021-05-24 DIAGNOSIS — S72112A Displaced fracture of greater trochanter of left femur, initial encounter for closed fracture: Principal | ICD-10-CM

## 2021-05-24 LAB — CBC WITH AUTOMATED DIFF
ABS. BASOPHILS: 0.1 10*3/uL (ref 0.0–0.1)
ABS. EOSINOPHILS: 0.2 10*3/uL (ref 0.0–0.4)
ABS. IMM. GRANS.: 0.1 10*3/uL — ABNORMAL HIGH (ref 0.00–0.04)
ABS. LYMPHOCYTES: 1.6 10*3/uL (ref 0.8–3.5)
ABS. MONOCYTES: 0.8 10*3/uL (ref 0.0–1.0)
ABS. NEUTROPHILS: 9.8 10*3/uL — ABNORMAL HIGH (ref 1.8–8.0)
ABSOLUTE NRBC: 0 10*3/uL (ref 0.00–0.01)
BASOPHILS: 0 % (ref 0–1)
EOSINOPHILS: 1 % (ref 0–7)
HCT: 39 % (ref 35.0–47.0)
HGB: 12.6 g/dL (ref 11.5–16.0)
IMMATURE GRANULOCYTES: 1 % — ABNORMAL HIGH (ref 0–0.5)
LYMPHOCYTES: 13 % (ref 12–49)
MCH: 29.2 PG (ref 26.0–34.0)
MCHC: 32.3 g/dL (ref 30.0–36.5)
MCV: 90.3 FL (ref 80.0–99.0)
MONOCYTES: 6 % (ref 5–13)
MPV: 8.7 FL — ABNORMAL LOW (ref 8.9–12.9)
NEUTROPHILS: 79 % — ABNORMAL HIGH (ref 32–75)
NRBC: 0 PER 100 WBC
PLATELET: 282 10*3/uL (ref 150–400)
RBC: 4.32 M/uL (ref 3.80–5.20)
RDW: 15.2 % — ABNORMAL HIGH (ref 11.5–14.5)
WBC: 12.5 10*3/uL — ABNORMAL HIGH (ref 3.6–11.0)

## 2021-05-24 LAB — PTT: aPTT: 27.4 s (ref 21.2–34.1)

## 2021-05-24 LAB — METABOLIC PANEL, BASIC
Anion gap: 7 mmol/L (ref 5–15)
BUN/Creatinine ratio: 15 (ref 12–20)
BUN: 13 mg/dL (ref 6–20)
CO2: 23 mmol/L (ref 21–32)
Calcium: 9.5 mg/dL (ref 8.5–10.1)
Chloride: 105 mmol/L (ref 97–108)
Creatinine: 0.87 mg/dL (ref 0.55–1.02)
Glucose: 124 mg/dL — ABNORMAL HIGH (ref 65–100)
Potassium: 5 mmol/L (ref 3.5–5.1)
Sodium: 135 mmol/L — ABNORMAL LOW (ref 136–145)
eGFR: >60 mL/min/{1.73_m2} (ref >60)

## 2021-05-24 LAB — PROTHROMBIN TIME + INR
INR: 1 (ref 0.9–1.1)
Prothrombin time: 13.4 s (ref 11.9–14.6)

## 2021-05-24 LAB — TROPONIN-HIGH SENSITIVITY: Troponin-High Sensitivity: 6 ng/L (ref 0–51)

## 2021-05-24 LAB — BASIC METABOLIC PANEL
Anion Gap: 7 mmol/L (ref 5–15)
BUN: 13 mg/dL (ref 6–20)
Bun/Cre Ratio: 15 (ref 12–20)
CO2: 23 mmol/L (ref 21–32)
Calcium: 9.5 mg/dL (ref 8.5–10.1)
Chloride: 105 mmol/L (ref 97–108)
Creatinine: 0.87 mg/dL (ref 0.55–1.02)
ESTIMATED GLOMERULAR FILTRATION RATE: >60 mL/min/{1.73_m2} (ref >60)
Glucose: 124 mg/dL — ABNORMAL HIGH (ref 65–100)
Potassium: 5 mmol/L (ref 3.5–5.1)
Sodium: 135 mmol/L — ABNORMAL LOW (ref 136–145)

## 2021-05-24 LAB — CBC WITH AUTO DIFFERENTIAL
Basophils %: 0 % (ref 0–1)
Basophils Absolute: 0.1 10*3/uL (ref 0.0–0.1)
Eosinophils %: 1 % (ref 0–7)
Eosinophils Absolute: 0.2 10*3/uL (ref 0.0–0.4)
Granulocyte Absolute Count: 0.1 10*3/uL — ABNORMAL HIGH (ref 0.00–0.04)
Hematocrit: 39 % (ref 35.0–47.0)
Hemoglobin: 12.6 g/dL (ref 11.5–16.0)
Immature Granulocytes: 1 % — ABNORMAL HIGH (ref 0–0.5)
Lymphocytes %: 13 % (ref 12–49)
Lymphocytes Absolute: 1.6 10*3/uL (ref 0.8–3.5)
MCH: 29.2 PG (ref 26.0–34.0)
MCHC: 32.3 g/dL (ref 30.0–36.5)
MCV: 90.3 FL (ref 80.0–99.0)
MPV: 8.7 FL — ABNORMAL LOW (ref 8.9–12.9)
Monocytes %: 6 % (ref 5–13)
Monocytes Absolute: 0.8 10*3/uL (ref 0.0–1.0)
NRBC Absolute: 0 10*3/uL (ref 0.00–0.01)
Neutrophils %: 79 % — ABNORMAL HIGH (ref 32–75)
Neutrophils Absolute: 9.8 10*3/uL — ABNORMAL HIGH (ref 1.8–8.0)
Nucleated RBCs: 0 PER 100 WBC
Platelets: 282 10*3/uL (ref 150–400)
RBC: 4.32 M/uL (ref 3.80–5.20)
RDW: 15.2 % — ABNORMAL HIGH (ref 11.5–14.5)
WBC: 12.5 10*3/uL — ABNORMAL HIGH (ref 3.6–11.0)

## 2021-05-24 LAB — APTT: aPTT: 27.4 s (ref 21.2–34.1)

## 2021-05-24 LAB — TROPONIN, HIGH SENSITIVITY: Troponin, High Sensitivity: 6 ng/L (ref 0–51)

## 2021-05-24 LAB — PROTIME-INR
INR: 1 (ref 0.9–1.1)
Protime: 13.4 s (ref 11.9–14.6)

## 2021-05-24 MED ORDER — ATORVASTATIN 40 MG TAB
40 mg | Freq: Every day | ORAL | Status: AC
Start: 2021-05-24 — End: 2021-05-28
  Administered 2021-05-25 – 2021-05-27 (×3): via ORAL

## 2021-05-24 MED ORDER — ONDANSETRON (PF) 4 MG/2 ML INJECTION
4 mg/2 mL | Freq: Four times a day (QID) | INTRAMUSCULAR | Status: AC | PRN
Start: 2021-05-24 — End: 2021-05-28
  Administered 2021-05-25 – 2021-05-27 (×3): via INTRAVENOUS

## 2021-05-24 MED ORDER — SENNOSIDES-DOCUSATE SODIUM 8.6 MG-50 MG TAB
Freq: Two times a day (BID) | ORAL | Status: AC
Start: 2021-05-24 — End: 2021-05-28
  Administered 2021-05-25 – 2021-05-28 (×6): via ORAL

## 2021-05-24 MED ORDER — BUDESONIDE-FORMOTEROL HFA 160 MCG-4.5 MCG/ACTUATION AEROSOL INHALER
160-4.5 mcg/actuation | Freq: Two times a day (BID) | RESPIRATORY_TRACT | Status: DC
Start: 2021-05-24 — End: 2021-05-28
  Administered 2021-05-25 – 2021-05-28 (×6): via RESPIRATORY_TRACT

## 2021-05-24 MED ORDER — MORPHINE 2 MG/ML INJECTION
2 mg/mL | INTRAMUSCULAR | Status: DC | PRN
Start: 2021-05-24 — End: 2021-05-24

## 2021-05-24 MED ORDER — SODIUM CHLORIDE 0.9 % IJ SYRG
Freq: Three times a day (TID) | INTRAMUSCULAR | Status: AC
Start: 2021-05-24 — End: 2021-05-28
  Administered 2021-05-25 – 2021-05-28 (×6): via INTRAVENOUS

## 2021-05-24 MED ORDER — SODIUM CHLORIDE 0.9 % IJ SYRG
INTRAMUSCULAR | Status: DC | PRN
Start: 2021-05-24 — End: 2021-05-28

## 2021-05-24 MED ORDER — TRAMADOL 50 MG TAB
50 mg | ORAL | Status: AC
Start: 2021-05-24 — End: 2021-05-24
  Administered 2021-05-24: 17:00:00 via ORAL

## 2021-05-24 MED ORDER — MORPHINE 4 MG/ML INTRAVENOUS SOLUTION
4 mg/mL | INTRAVENOUS | Status: AC | PRN
Start: 2021-05-24 — End: 2021-05-26
  Administered 2021-05-25: 05:00:00 via INTRAVENOUS

## 2021-05-24 MED ORDER — MORPHINE 4 MG/ML INTRAVENOUS SOLUTION
4 mg/mL | INTRAVENOUS | Status: DC | PRN
Start: 2021-05-24 — End: 2021-05-24

## 2021-05-24 MED ORDER — TIOTROPIUM BROMIDE 2.5 MCG/ACTUATION MIST FOR INHALATION
2.5 mcg/actuation | Freq: Every day | RESPIRATORY_TRACT | Status: AC
Start: 2021-05-24 — End: 2021-05-28
  Administered 2021-05-25 – 2021-05-27 (×3): via RESPIRATORY_TRACT

## 2021-05-24 MED ORDER — ACETAMINOPHEN 650 MG RECTAL SUPPOSITORY
650 mg | Freq: Four times a day (QID) | RECTAL | Status: AC | PRN
Start: 2021-05-24 — End: 2021-05-26

## 2021-05-24 MED ORDER — POLYETHYLENE GLYCOL 3350 17 GRAM (100 %) ORAL POWDER PACKET
17 gram | Freq: Every day | ORAL | Status: AC | PRN
Start: 2021-05-24 — End: 2021-05-28

## 2021-05-24 MED ORDER — ACETAMINOPHEN 325 MG TABLET
325 mg | Freq: Four times a day (QID) | ORAL | Status: AC | PRN
Start: 2021-05-24 — End: 2021-05-26
  Administered 2021-05-26 (×2): via ORAL

## 2021-05-24 MED ORDER — METOPROLOL SUCCINATE SR 25 MG 24 HR TAB
25 mg | Freq: Every day | ORAL | Status: DC
Start: 2021-05-24 — End: 2021-05-28
  Administered 2021-05-26 – 2021-05-27 (×2): via ORAL

## 2021-05-24 MED ORDER — ALBUTEROL SULFATE HFA 90 MCG/ACTUATION AEROSOL INHALER
90 mcg/actuation | RESPIRATORY_TRACT | Status: AC | PRN
Start: 2021-05-24 — End: 2021-05-28

## 2021-05-24 MED ORDER — MORPHINE 2 MG/ML INJECTION
2 mg/mL | INTRAMUSCULAR | Status: DC | PRN
Start: 2021-05-24 — End: 2021-05-26
  Administered 2021-05-25: 09:00:00 via INTRAVENOUS

## 2021-05-24 MED ORDER — PANTOPRAZOLE 40 MG TAB, DELAYED RELEASE
40 mg | Freq: Every day | ORAL | Status: DC
Start: 2021-05-24 — End: 2021-05-28
  Administered 2021-05-25 – 2021-05-27 (×3): via ORAL

## 2021-05-24 MED ORDER — TRAMADOL 50 MG TAB
50 mg | ORAL_TABLET | Freq: Four times a day (QID) | ORAL | 0 refills | Status: AC | PRN
Start: 2021-05-24 — End: 2021-05-27

## 2021-05-24 MED ORDER — ONDANSETRON 4 MG TAB, RAPID DISSOLVE
4 mg | Freq: Three times a day (TID) | ORAL | Status: AC | PRN
Start: 2021-05-24 — End: 2021-05-28

## 2021-05-24 MED FILL — SODIUM CHLORIDE 0.9 % IJ SYRG: INTRAMUSCULAR | Qty: 40

## 2021-05-24 MED FILL — TRAMADOL 50 MG TAB: 50 mg | ORAL | Qty: 1

## 2021-05-24 NOTE — ED Notes (Signed)
Pt does not have any assistance to pick her up.Transportation planned is a cab. Cab will call Charge Nurse when they arrive to the ED for pick up.  States that she will be able to get into her house. States she already has home Physical therapy to assist her several times per week. We reviewed fall prevention and knee exercises. Voiced verbal understanding. Alert and oriented at this time

## 2021-05-24 NOTE — ED Notes (Signed)
Ace wrap applied to left knee.

## 2021-05-24 NOTE — ED Notes (Signed)
Patient self reporting that she is no longer short of breath after receiving a breathing treatment. Patient resting in stretcher watching television. Verbal orders received from Dr Darcel Bayley to upgrade patient to remote telemetry.

## 2021-05-24 NOTE — ED Notes (Signed)
ED Notes  by Karis Juba, RN at 05/24/21 2213                Author: Karis Juba, RN  Service: EMERGENCY  Author Type: Registered Nurse       Filed: 05/24/21 2214  Date of Service: 05/24/21 2213  Status: Signed          Editor: Karis Juba, RN (Registered Nurse)               Patient complaining of SOB and feels like heart is racing. EKG was done and given to MD. MD advising to do Duo neb and see how she responds at this time

## 2021-05-24 NOTE — ED Provider Notes (Signed)
ED Provider Notes by Noralee Stain, NP at 05/24/21 1419                Author: Noralee Stain, NP  Service: Emergency Medicine  Author Type: Nurse Practitioner       Filed: 05/24/21 1919  Date of Service: 05/24/21 1419  Status: Attested           Editor: Noralee Stain, NP (Nurse Practitioner)  Cosigner: Ander Gaster, MD at 05/25/21 [phone]          Attestation signed by Ander Gaster, MD at 05/25/21 [phone]          7:16 AM   I was personally available for consultation in the emergency department.  I have reviewed the chart and agree with the documentation recorded by the APP, including the assessment, treatment plan, and disposition.   Ander Gaster, MD                                 [facility] EMERGENCY DEPT   EMERGENCY DEPARTMENT HISTORY AND PHYSICAL EXAM           Date: 05/24/2021   Patient Name: [name]   MRN: [id number]   [place]: 12/23/1945   Date of evaluation: 05/24/2021   Provider: Noralee Stain, NP    Note Started: 2:19 PM 05/24/21        HISTORY OF PRESENT ILLNESS          Chief Complaint       Patient presents with        ?  Fall        ?  Knee Pain           History Provided By: Patient      HPI: [name] is a 76 y.o. female hx of hypertension, high cholesterol, multiple falls. Pt arrived by ems today for fall and left knee pain.  Patient denies pain to head, neck. No back pain.    Pt recently started walking with walker but was not using at time of fall today. Pt does live alone.       Review of Systems    Constitutional: Negative.     HENT: Negative.      Eyes: Negative.     Respiratory: Negative.      Cardiovascular: Negative.     Gastrointestinal: Negative.  Negative for nausea and vomiting.    Genitourinary: Negative.  Negative for dysuria and urgency.    Musculoskeletal:  Positive for falls and joint pain  (Left knee pain. lateral to patella).    Skin: Negative.     Neurological:  Positive for weakness. Negative for dizziness, sensory change, speech change and headaches.     Endo/Heme/Allergies: Negative.     Psychiatric/Behavioral: Negative.      All other systems reviewed and are negative.         PAST MEDICAL HISTORY     Past Medical History:     Past Medical History:        Diagnosis  Date         ?  Chronic obstructive pulmonary disease (HCC)       ?  Hypercholesteremia       ?  Insomnia           ?  Sleep disorder             Past Surgical History:  No past surgical history on file.      Family History:   History reviewed. No pertinent family history.      Social History:     Social History          Tobacco Use         ?  Smoking status:  Former              Packs/day:  1.00         Types:  Cigarettes         Quit date:  2021         Years since quitting:  2.3         Passive exposure:  Past         ?  Smokeless tobacco:  Never       Vaping Use         ?  Vaping Use:  Never used       Substance Use Topics         ?  Alcohol use:  Yes              Alcohol/week:  1.0 standard drink         Types:  1 Cans of beer per week             Comment: 1 can of beer every 1-2 weeks         ?  Drug use:  Never           Allergies:     Allergies        Allergen  Reactions         ?  Cheese  Nausea and Vomiting             Severe vomiting           PCP: Cioflec, Adela Ports, MD      Current Meds:      Previous Medications           ACETAMINOPHEN (TYLENOL) 325 MG TABLET     Take 325 mg by mouth every four (4) hours as needed for Pain (Take 2 tablets by mouth as needed for pain).           ALBUTEROL (PROVENTIL HFA, VENTOLIN HFA, PROAIR HFA) 90 MCG/ACTUATION INHALER     albuterol sulfate HFA 90 mcg/actuation aerosol inhaler    INHALE 2 PUFFS EVERY 6 HOURS AS NEEDED           ATORVASTATIN (LIPITOR) 40 MG TABLET     Take 40 mg by mouth daily.           FLUTICASONE-UMECLIDIN-VILANTER (TRELEGY ELLIPTA) 200-62.5-25 MCG INHALER     Trelegy Ellipta 200 mcg-62.5 mcg-25 mcg powder for inhalation    INHALE 1 PUFF BY MOUTH ONCE A DAY           METOPROLOL SUCCINATE (TOPROL-XL) 25 MG XL TABLET     Take 25 mg by  mouth daily.           OMEPRAZOLE (PRILOSEC) 20 MG CAPSULE     Take 20 mg by mouth daily.           POLYETHYLENE GLYCOL (MIRALAX) 17 GRAM PACKET     Take 1 Packet by mouth daily.           SENNA-DOCUSATE (PERICOLACE) 8.6-50 MG PER TABLET     Take 1 Tablet by mouth two (2) times a day.  PHYSICAL EXAM          ED Triage Vitals     ED Encounter Vitals Group            BP  05/24/21 1138  (!) 157/99        Pulse (Heart Rate)  05/24/21 1138  85        Resp Rate  05/24/21 1138  20        Temp  05/24/21 1138  98.5 F (36.9 C)        Temp src  --          O2 Sat (%)  05/24/21 1138  97 %        Weight  05/24/21 1137  105 lb            Height  05/24/21 1137  5' 3"          Physical Exam   Vitals and nursing note reviewed.    Constitutional:        General: She is not in acute distress.      Appearance: Normal appearance. She is normal weight. She is not ill-appearing.    HENT:       Head: Normocephalic and atraumatic.    Eyes:       Extraocular Movements: Extraocular movements intact.       Pupils: Pupils are equal, round, and reactive to light.     Cardiovascular:       Rate and Rhythm: Normal rate and regular rhythm.       Pulses: Normal pulses.       Heart sounds: Normal heart sounds. No murmur heard.   Pulmonary:       Effort: Pulmonary effort is normal. No respiratory distress.       Breath sounds: Normal breath sounds.     Abdominal:       General: Abdomen is flat.       Palpations: Abdomen is soft.     Musculoskeletal:       Cervical back: Normal range of motion and neck supple. No tenderness.       Right hip: Normal. No deformity or tenderness. Normal range of motion.       Left hip: Tenderness (with inward rotation. -pain on external rotation.) present.  Normal range of motion.       Right knee: No swelling or deformity. Normal range of motion. No tenderness. No MCL or ACL tenderness. Normal pulse.       Instability Tests: Anterior drawer test negative. Posterior drawer test negative.       Left knee: No  swelling or deformity. Decreased range of motion. Tenderness  present. No MCL or ACL tenderness.       Instability Tests: Anterior drawer test negative. Posterior drawer test negative.    Skin:      General: Skin is warm and dry.       Capillary Refill: Capillary refill takes less than 2 seconds.    Neurological:       General: No focal deficit present.       Mental Status: She is alert and oriented to person, place, and time.    Psychiatric:          Mood and Affect: Mood normal.          Behavior: Behavior normal.               SCREENINGS  LAB, EKG AND DIAGNOSTIC RESULTS     Labs:     Recent Results (from the past 12 hour(s))     METABOLIC PANEL, BASIC          Collection Time: 05/24/21  5:49 PM         Result  Value  Ref Range            Sodium  135 (L)  136 - 145 mmol/L       Potassium  5.0  3.5 - 5.1 mmol/L       Chloride  105  97 - 108 mmol/L       CO2  23  21 - 32 mmol/L       Anion gap  7  5 - 15 mmol/L       Glucose  124 (H)  65 - 100 mg/dL       BUN  13  6 - 20 mg/dL       Creatinine  0.87  0.55 - 1.02 mg/dL       BUN/Creatinine ratio  15  12 - 20         eGFR  >60  >60 ml/min/1.91m       Calcium  9.5  8.5 - 10.1 mg/dL       CBC WITH AUTOMATED DIFF          Collection Time: 05/24/21  6:44 PM         Result  Value  Ref Range            WBC  12.5 (H)  3.6 - 11.0 K/uL       RBC  4.32  3.80 - 5.20 M/uL       HGB  12.6  11.5 - 16.0 g/dL       HCT  39.0  35.0 - 47.0 %       MCV  90.3  80.0 - 99.0 FL       MCH  29.2  26.0 - 34.0 PG       MCHC  32.3  30.0 - 36.5 g/dL       RDW  15.2 (H)  11.5 - 14.5 %       PLATELET  282  150 - 400 K/uL       MPV  8.7 (L)  8.9 - 12.9 FL       NRBC  0.0  0.0 PER 100 WBC       ABSOLUTE NRBC  0.00  0.00 - 0.01 K/uL       NEUTROPHILS  79 (H)  32 - 75 %       LYMPHOCYTES  13  12 - 49 %       MONOCYTES  6  5 - 13 %       EOSINOPHILS  1  0 - 7 %       BASOPHILS  0  0 - 1 %       IMMATURE GRANULOCYTES  1 (H)  0 - 0.5 %       ABS. NEUTROPHILS  9.8 (H)  1.8 - 8.0 K/UL        ABS. LYMPHOCYTES  1.6  0.8 - 3.5 K/UL       ABS. MONOCYTES  0.8  0.0 - 1.0 K/UL       ABS. EOSINOPHILS  0.2  0.0 - 0.4 K/UL       ABS. BASOPHILS  0.1  0.0 - 0.1 K/UL  ABS. IMM. GRANS.  0.1 (H)  0.00 - 0.04 K/UL            DF  AUTOMATED              EKG: Initial EKG interpreted by me. See ED Course Below      Radiologic Studies:   Non-plain film images such as CT, Ultrasound and MRI are read by the radiologist. Plain radiographic images are visualized and preliminarily interpreted by the ED Physician with the following findings:  See ED Course Below      Interpretation per the Radiologist below, if available at the time of this note:   XR PELV AP ONLY      Result Date: 05/24/2021   EXAM: XR PELV AP ONLY, XR FEMUR LT 2 V INDICATION: Pain. Unable to stand.. COMPARISON: Radiographs 01/26/2021. FINDINGS: An AP view  of the pelvis and 4 views of the left femur demonstrates moderate diffuse osteopenia. 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 There is interval subsidence of the femoral component measuring approximately 15 mm. There are fractures of the greater and lesser trochanters of the left femur. No other femoral fracture is shown. No innominate bone  fracture is demonstrated. 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 Extensive atherosclerotic calcifications are noted.       Marked diffuse osteopenia. Left femoral bipolar hemiarthroplasty with interval subsidence measuring 15 mm. Acute appearing fractures of left femoral greater and lesser trochanters.      XR FEMUR LT 2 V      Result Date: 05/24/2021   EXAM: XR PELV AP ONLY, XR FEMUR LT 2 V INDICATION: Pain. Unable to stand.. COMPARISON: Radiographs 01/26/2021. FINDINGS: An AP view  of the pelvis and 4 views of the left femur demonstrates moderate diffuse osteopenia. 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 There is interval subsidence  of the femoral component measuring approximately 15 mm. There are fractures of the greater and lesser trochanters of the left femur. No other femoral fracture is shown. No innominate bone  fracture is demonstrated. 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 Extensive atherosclerotic calcifications are noted.       Marked diffuse osteopenia. Left femoral bipolar hemiarthroplasty with interval subsidence measuring 15 mm. Acute appearing fractures of left femoral greater and lesser trochanters.      XR KNEE LT 3 V      Result Date: 05/24/2021   INDICATION:  fall Exam: AP, lateral, oblique views of the left knee. FINDINGS: There is no acute fracture-dislocation. There is marked narrowing of the medial lateral tibiofemoral joint spaces. The osseous structures are diffusely demineralized. No suprapatellar  joint fluid is visualized.       No acute fracture or dislocation.            ED COURSE and DIFFERENTIAL DIAGNOSIS/MDM     CC/HPI/PE Summary, DDx: 1.Fall risk   2. Knee pain,    3. Knee contusion   4. Knee dislocation   5. Knee sprain/strain   6. Left hip fracture.   7. Left hip dislocation         Records Reviewed (source and summary of external notes): Prior medical records and Nursing notes      Vitals:       Vitals:            05/24/21 1137  05/24/21 1138  05/24/21 1140  BP:    (!) 157/99       Pulse:    85       Resp:    20       Temp:    98.5 F (36.9 C)       SpO2:    97%  96%     Weight:  47.6 kg (105 lb)              Height:  5' 3"  (1.6 m)                ED COURSE     ED Course as of 05/24/21 1918       Tue May 24, 2021        1248  Knee Xray reviewed. No fracture.     [CB]     22  Prelminary pend radiologist review. Concern for femoral head fracture.  [CB]              ED Course User Index   [CB] Noralee Stain, NP           Disposition Considerations (Tests not done, Shared Decision Making, Pt Expectation of Test or Treatment.): See MDM      Patient was  given the following medications:     Medications       traMADoL (ULTRAM) tablet 50 mg (50 mg Oral Given 05/24/21 1240)           CONSULTS: (Who and What was discussed)   None       Social Determinants affecting Dx or Tx: Lacks support at home/Lives alone      Smoking Cessation: Not Applicable        PROCEDURES     Unless otherwise noted above, none.   Procedures          CRITICAL CARE TIME     Patient does not meet Critical Care Time, 0 minutes        FINAL IMPRESSION           1.  Knee strain, left, initial encounter      2.  Fall, initial encounter      3.  Displaced fracture of greater trochanter of left femur, initial encounter for closed fracture ([place])      4.  Displaced fracture of lesser trochanter of left femur, initial encounter for closed fracture ([place])         5.  Peri-prosthetic fracture around prosthetic hip, initial encounter                DISPOSITION/PLAN     Admitted      Admit Note: Pt is being admitted by Dr Darcel Bayley. The results of their tests and reason(s) for their admission have been discussed with pt and/or available family. They convey agreement and understanding for the need to be admitted and for the admission diagnosis.       PATIENT REFERRED TO:     Follow-up Information      None           I am the Primary Clinician of Record: Noralee Stain, NP (electronically  signed)      (Please note that parts of this dictation were completed with voice recognition software. Quite often unanticipated grammatical, syntax, homophones, and other interpretive errors are inadvertently transcribed  by the computer software. Please disregards these errors. Please excuse any errors that have escaped final proofreading.)

## 2021-05-24 NOTE — H&P (Signed)
H&P by Theotis Barrio, MD  at 05/24/21 1930                Author: Theotis Barrio, MD  Service: Internal Medicine  Author Type: Physician       Filed: 05/24/21 1945  Date of Service: 05/24/21 1930  Status: Signed          Editor: Theotis Barrio, MD (Physician)                  History and Physical          Patient: [name]  MRN: [id number]   SSN: [id number]          Date of Birth: 07/05/1945   Age: 76 y.o.   Sex: female           Subjective:         Debrina Kizer is a 76 y.o. female with PMH of COPD, HLP and recent fall with left femur fracture s/p ORIF of the left hip. She presented to the ED with chief complaint of fall and left hip pain. She reports mechanical fall when walking at home today,  was not using walker. She denies LOC, lightheadedness or vertigo prior to fall. No focal neurology symptoms. She report left hip pain, severe in nature, worsened with movement.       In the ED, noted hypertensive. X-ray showed acute fracture of the left femoral greater and lesser trochanters.      Chart review: none        Past Medical History:        Diagnosis  Date         ?  Chronic obstructive pulmonary disease (HCC)       ?  Hypercholesteremia       ?  Insomnia           ?  Sleep disorder          History reviewed. No pertinent family history.     Social History          Tobacco Use         ?  Smoking status:  Former              Packs/day:  1.00         Types:  Cigarettes         Quit date:  2021         Years since quitting:  2.3         Passive exposure:  Past         ?  Smokeless tobacco:  Never       Substance Use Topics         ?  Alcohol use:  Yes              Alcohol/week:  1.0 standard drink         Types:  1 Cans of beer per week             Comment: 1 can of beer every 1-2 weeks              Objective:        Physical Exam:    General: alert, cooperative, no distress   Eye: conjunctivae/corneas clear. PERRL, EOM's intact.    Throat and Neck: normal and no erythema or exudates noted. No mass    Lung:  Decreased lung sounds and generalized crackles, no wheezing    Heart: regular rate  and rhythm,    Abdomen: soft, non-tender. Bowel sounds normal. No masses,   Extremities: No LE edema. able to move all extremities normal, atraumatic. PTA intact bilaterally.    Skin: Normal.   Neurologic: AOx3. Motor function and sensation grossly intact.   Psychiatric: non focal      Most recent lab work and imaging results reviewed in EMR.         Assessment and plan:     # Left femur acute fracture   - Pain control. IV morphine PRN   - Consult orthopedics    - Keep NPO after midnight   - Hold anti-coagulant.        # Osteopenia   - Check vitamin D level. Calcium level within normal limits    - replace as needed.       # Recurrent fall   - Already has home PT/OT. May need acute rehab after discharge this time. PT/OT evaluation.    - Orthostatic vital signs.      # COPD   - Not in respiratory distress, however diffused crackles on exam.   - chest X-ray to evaluate for abnormality.   - Continue home medications.        # Essential hypertension    - Continue home medications.   PO metoprolol       # HLP    - Continue home medications.   PO lipitor.       # Social Determents of health: None      # Full code by default, need further clarification      # Medication reconciliation: Medication list reviewed on Epic and/or outside documentation. Not reviewed with patient. However, medication reconciliation incomplete, appreciate assistance from pharmacy or nursing staff.         Signed By:  Theotis Barrio, MD           May 24, 2021

## 2021-05-24 NOTE — ED Notes (Signed)
ED Triage  Notes by Alroy Dust at 05/24/21 1136                Author: Alroy Dust  Service: NURSING  Author Type: Registered Nurse       Filed: 05/24/21 1137  Date of Service: 05/24/21 1136  Status: Signed          Editor: Alroy Dust (Registered Nurse)               Fall around 1030 this AM, no loc, no blood thinners. Complaining of left knee pain. Unable to ambulate

## 2021-05-25 LAB — EKG, 12 LEAD, INITIAL
Atrial Rate: 121 {beats}/min
Calculated P Axis: 48 degrees
Calculated R Axis: -11 degrees
Calculated T Axis: 51 degrees
P-R Interval: 158 ms
Q-T Interval: 302 ms
QRS Duration: 64 ms
QTC Calculation (Bezet): 428 ms
Ventricular Rate: 121 {beats}/min

## 2021-05-25 LAB — METABOLIC PANEL, BASIC
Anion gap: 3 mmol/L — ABNORMAL LOW (ref 5–15)
BUN/Creatinine ratio: 17 (ref 12–20)
BUN: 16 mg/dL (ref 6–20)
CO2: 27 mmol/L (ref 21–32)
Calcium: 9.1 mg/dL (ref 8.5–10.1)
Chloride: 104 mmol/L (ref 97–108)
Creatinine: 0.95 mg/dL (ref 0.55–1.02)
Glucose: 111 mg/dL — ABNORMAL HIGH (ref 65–100)
Potassium: 4.1 mmol/L (ref 3.5–5.1)
Sodium: 134 mmol/L — ABNORMAL LOW (ref 136–145)
eGFR: >60 mL/min/{1.73_m2} (ref >60)

## 2021-05-25 LAB — CBC W/O DIFF
ABSOLUTE NRBC: 0 10*3/uL (ref 0.00–0.01)
HCT: 39.6 % (ref 35.0–47.0)
HGB: 12.7 g/dL (ref 11.5–16.0)
MCH: 29.1 PG (ref 26.0–34.0)
MCHC: 32.1 g/dL (ref 30.0–36.5)
MCV: 90.6 FL (ref 80.0–99.0)
MPV: 8.5 FL — ABNORMAL LOW (ref 8.9–12.9)
NRBC: 0 PER 100 WBC
PLATELET: 280 10*3/uL (ref 150–400)
RBC: 4.37 M/uL (ref 3.80–5.20)
RDW: 15.2 % — ABNORMAL HIGH (ref 11.5–14.5)
WBC: 12.1 10*3/uL — ABNORMAL HIGH (ref 3.6–11.0)

## 2021-05-25 LAB — VITAMIN D, 25 HYDROXY: Vitamin D 25-Hydroxy: 18 ng/mL — ABNORMAL LOW (ref 30–100)

## 2021-05-25 LAB — EKG 12-LEAD
Atrial Rate: 121 {beats}/min
P Axis: 48 degrees
P-R Interval: 158 ms
Q-T Interval: 302 ms
QRS Duration: 64 ms
QTc Calculation (Bazett): 428 ms
R Axis: -11 degrees
T Axis: 51 degrees
Ventricular Rate: 121 {beats}/min

## 2021-05-25 LAB — BASIC METABOLIC PANEL
Anion Gap: 3 mmol/L — ABNORMAL LOW (ref 5–15)
BUN: 16 mg/dL (ref 6–20)
Bun/Cre Ratio: 17 (ref 12–20)
CO2: 27 mmol/L (ref 21–32)
Calcium: 9.1 mg/dL (ref 8.5–10.1)
Chloride: 104 mmol/L (ref 97–108)
Creatinine: 0.95 mg/dL (ref 0.55–1.02)
Est, Glom Filt Rate: >60 mL/min/{1.73_m2} (ref >60)
Glucose: 111 mg/dL — ABNORMAL HIGH (ref 65–100)
Potassium: 4.1 mmol/L (ref 3.5–5.1)
Sodium: 134 mmol/L — ABNORMAL LOW (ref 136–145)

## 2021-05-25 LAB — CBC
Hematocrit: 39.6 % (ref 35.0–47.0)
Hemoglobin: 12.7 g/dL (ref 11.5–16.0)
MCH: 29.1 PG (ref 26.0–34.0)
MCHC: 32.1 g/dL (ref 30.0–36.5)
MCV: 90.6 FL (ref 80.0–99.0)
MPV: 8.5 FL — ABNORMAL LOW (ref 8.9–12.9)
NRBC Absolute: 0 10*3/uL (ref 0.00–0.01)
Nucleated RBCs: 0 PER 100 WBC
Platelets: 280 10*3/uL (ref 150–400)
RBC: 4.37 M/uL (ref 3.80–5.20)
RDW: 15.2 % — ABNORMAL HIGH (ref 11.5–14.5)
WBC: 12.1 10*3/uL — ABNORMAL HIGH (ref 3.6–11.0)

## 2021-05-25 LAB — VITAMIN D 25 HYDROXY: Vit D, 25-Hydroxy: 18 ng/mL — ABNORMAL LOW (ref 30–100)

## 2021-05-25 MED ORDER — IPRATROPIUM-ALBUTEROL 2.5 MG-0.5 MG/3 ML NEB SOLUTION
2.5 mg-0.5 mg/3 ml | RESPIRATORY_TRACT | Status: AC
Start: 2021-05-25 — End: 2021-05-24
  Administered 2021-05-25: 02:00:00 via RESPIRATORY_TRACT

## 2021-05-25 MED FILL — STIMULANT LAXATIVE PLUS 8.6 MG-50 MG TABLET: ORAL | Qty: 1

## 2021-05-25 MED FILL — ONDANSETRON (PF) 4 MG/2 ML INJECTION: 4 mg/2 mL | INTRAMUSCULAR | Qty: 2

## 2021-05-25 MED FILL — MORPHINE 4 MG/ML SYRINGE: 4 mg/mL | INTRAMUSCULAR | Qty: 1

## 2021-05-25 MED FILL — SYMBICORT 160 MCG-4.5 MCG/ACTUATION HFA AEROSOL INHALER: RESPIRATORY_TRACT | Qty: 6

## 2021-05-25 MED FILL — ATORVASTATIN 40 MG TAB: 40 mg | ORAL | Qty: 1

## 2021-05-25 MED FILL — PANTOPRAZOLE 40 MG TAB, DELAYED RELEASE: 40 mg | ORAL | Qty: 1

## 2021-05-25 MED FILL — METOPROLOL SUCCINATE SR 25 MG 24 HR TAB: 25 mg | ORAL | Qty: 1

## 2021-05-25 MED FILL — MORPHINE 2 MG/ML INJECTION: 2 mg/mL | INTRAMUSCULAR | Qty: 1

## 2021-05-25 MED FILL — IPRATROPIUM-ALBUTEROL 2.5 MG-0.5 MG/3 ML NEB SOLUTION: 2.5 mg-0.5 mg/3 ml | RESPIRATORY_TRACT | Qty: 3

## 2021-05-25 MED FILL — SPIRIVA RESPIMAT 2.5 MCG/ACTUATION SOLUTION FOR INHALATION: 2.5 mcg/actuation | RESPIRATORY_TRACT | Qty: 4

## 2021-05-25 NOTE — Progress Notes (Signed)
Progress Notes by Bernie Covey, MD at 05/25/21 1413                Author: Bernie Covey, MD  Service: Hospitalist  Author Type: Physician       Filed: 05/25/21 1432  Date of Service: 05/25/21 1413  Status: Signed          Editor: Bernie Covey, MD (Physician)                                                     HOSPITALIST PROGRESS NOTE   Bernie Covey MD, [facility]   [place]              Daily Progress Note: 05/25/2021           Subjective:        Patient is alert and oriented x3.      Pain well controlled for now.      No overnight fever/chills, chest pain, shortness of breath noted.      AM femur/hip repair per orthopedic surgery on 05/26/2021.         Medications reviewed     Current Facility-Administered Medications          Medication  Dose  Route  Frequency           ?  albuterol (PROVENTIL HFA, VENTOLIN HFA, PROAIR HFA) inhaler 2 Puff   2 Puff  Inhalation  Q4H PRN     ?  budesonide-formoteroL (SYMBICORT) 160-4.5 mcg/actuation HFA inhaler 2 Puff   2 Puff  Inhalation  BID RT          And           ?  tiotropium bromide (SPIRIVA RESPIMAT) 2.5 mcg /actuation   2 Puff  Inhalation  DAILY     ?  senna-docusate (PERICOLACE) 8.6-50 mg per tablet 1 Tablet   1 Tablet  Oral  BID     ?  pantoprazole (PROTONIX) tablet 40 mg   40 mg  Oral  DAILY     ?  metoprolol succinate (TOPROL-XL) XL tablet 25 mg   25 mg  Oral  DAILY     ?  atorvastatin (LIPITOR) tablet 40 mg   40 mg  Oral  DAILY     ?  sodium chloride (NS) flush 5-40 mL   5-40 mL  IntraVENous  Q8H     ?  sodium chloride (NS) flush 5-40 mL   5-40 mL  IntraVENous  PRN     ?  acetaminophen (TYLENOL) tablet 650 mg   650 mg  Oral  Q6H PRN          Or           ?  acetaminophen (TYLENOL) suppository 650 mg   650 mg  Rectal  Q6H PRN     ?  polyethylene glycol (MIRALAX) packet 17 g   17 g  Oral  DAILY PRN     ?  ondansetron (ZOFRAN ODT) tablet 4 mg   4 mg  Oral  Q8H PRN          Or           ?   ondansetron (ZOFRAN) injection 4 mg   4 mg  IntraVENous  Q6H PRN     ?  morphine injection 2 mg   2 mg  IntraVENous  Q4H PRN           ?  morphine injection 4 mg   4 mg  IntraVENous  Q4H PRN             Review of Systems:     A comprehensive review of systems was negative except for that written in the HPI.        Objective:     Physical Exam:       Visit Vitals      BP  100/65 (BP 1 Location: Right upper arm, BP Patient Position: At rest;Semi fowlers)     Pulse  (!) 102     Temp  97.7 F (36.5 C)     Resp  18     Ht  5' 3"  (1.6 m)     Wt  47.6 kg (105 lb)     SpO2  93%        BMI  18.60 kg/m      O2 Flow Rate (L/min): 1 l/min O2 Device: Nasal cannula   Patient Vitals for the past 8 hrs:            Temp  Pulse  Resp  BP  SpO2            05/25/21 1120  97.7 F (36.5 C)  (!) 102  18  100/65  93 %            05/25/21 1008  --  --  --  --  95 %     05/25/21 1004  --  --  --  --  96 %            05/25/21 0758  97.5 F (36.4 C)  (!) 105  18  (!) 89/61  95 %              Temp (24hrs), Avg:98 F (36.7 C), Min:97.5 F (36.4 C), Max:98.4 F (36.9 C)     No intake/output data recorded.   No intake/output data recorded.         General:   Alert, cooperative, no distress, appears stated age.        Lungs:    Clear to auscultation bilaterally.        Chest wall:   No tenderness or deformity.        Heart:   Regular rate and rhythm, S1, S2 normal, no murmur, click, rub or gallop.        Abdomen:    Soft, non-tender. Bowel sounds normal. No masses,  No organomegaly.     Extremities:  Extremities normal, atraumatic, no cyanosis or edema.     Pulses:  2+ and symmetric all extremities.     Skin:  Skin color, texture, turgor normal. No rashes or lesions        Neurologic:  CNII-XII intact. No gross sensory or motor deficits          Data Review:          Recent Days:     Recent Labs            05/25/21   0506  05/24/21   1844     WBC  12.1*  12.5*     HGB  12.7  12.6     HCT  39.6  39.0         PLT  280  282          Recent Labs              05/25/21   0506  05/24/21   1851  05/24/21   1749     NA  134*   --   135*     K  4.1   --   5.0     CL  104   --   105     CO2  27   --   23     GLU  111*   --   124*     BUN  16   --   13     CREA  0.95   --   0.87     CA  9.1   --   9.5          INR   --   1.0   --         No results for input(s): PH, PCO2, PO2, HCO3, FIO2 in the last 72 hours.      24 Hour Results:     Recent Results (from the past 24 hour(s))     METABOLIC PANEL, BASIC          Collection Time: 05/24/21  5:49 PM         Result  Value  Ref Range            Sodium  135 (L)  136 - 145 mmol/L       Potassium  5.0  3.5 - 5.1 mmol/L       Chloride  105  97 - 108 mmol/L       CO2  23  21 - 32 mmol/L       Anion gap  7  5 - 15 mmol/L       Glucose  124 (H)  65 - 100 mg/dL       BUN  13  6 - 20 mg/dL       Creatinine  0.87  0.55 - 1.02 mg/dL       BUN/Creatinine ratio  15  12 - 20         eGFR  >60  >60 ml/min/1.66m       Calcium  9.5  8.5 - 10.1 mg/dL       CBC WITH AUTOMATED DIFF          Collection Time: 05/24/21  6:44 PM         Result  Value  Ref Range            WBC  12.5 (H)  3.6 - 11.0 K/uL       RBC  4.32  3.80 - 5.20 M/uL       HGB  12.6  11.5 - 16.0 g/dL       HCT  39.0  35.0 - 47.0 %       MCV  90.3  80.0 - 99.0 FL       MCH  29.2  26.0 - 34.0 PG       MCHC  32.3  30.0 - 36.5 g/dL       RDW  15.2 (H)  11.5 - 14.5 %       PLATELET  282  150 - 400 K/uL       MPV  8.7 (L)  8.9 - 12.9 FL       NRBC  0.0  0.0 PER 100 WBC       ABSOLUTE NRBC  0.00  0.00 - 0.01 K/uL       NEUTROPHILS  79 (H)  32 - 75 %       LYMPHOCYTES  13  12 - 49 %       MONOCYTES  6  5 - 13 %       EOSINOPHILS  1  0 - 7 %       BASOPHILS  0  0 - 1 %       IMMATURE GRANULOCYTES  1 (H)  0 - 0.5 %       ABS. NEUTROPHILS  9.8 (H)  1.8 - 8.0 K/UL       ABS. LYMPHOCYTES  1.6  0.8 - 3.5 K/UL       ABS. MONOCYTES  0.8  0.0 - 1.0 K/UL       ABS. EOSINOPHILS  0.2  0.0 - 0.4 K/UL       ABS. BASOPHILS  0.1  0.0 - 0.1 K/UL       ABS. IMM. GRANS.  0.1 (H)  0.00 - 0.04 K/UL        DF  AUTOMATED          TROPONIN-HIGH SENSITIVITY          Collection Time: 05/24/21  6:51 PM         Result  Value  Ref Range            Troponin-High Sensitivity  6  0 - 51 ng/L       PROTHROMBIN TIME + INR          Collection Time: 05/24/21  6:51 PM         Result  Value  Ref Range            Prothrombin time  13.4  11.9 - 14.6 sec       INR  1.0  0.9 - 1.1         PTT          Collection Time: 05/24/21  6:51 PM         Result  Value  Ref Range            aPTT  27.4  21.2 - 34.1 sec       aPTT, therapeutic range     82 - 109 sec       EKG, 12 LEAD, INITIAL          Collection Time: 05/24/21 10:05 PM         Result  Value  Ref Range            Ventricular Rate  121  BPM       Atrial Rate  121  BPM       P-R Interval  158  ms       QRS Duration  64  ms       Q-T Interval  302  ms       QTC Calculation (Bezet)  428  ms       Calculated P Axis  48  degrees       Calculated R Axis  -11  degrees       Calculated T Axis  51  degrees       Diagnosis                 Sinus tachycardia   Cannot rule out  Anterior infarct , age undetermined   Abnormal ECG   No previous ECGs available   Confirmed by Geraldine Solar, KWABENA ([id number]) on 05/25/2021 8:18:33 AM          METABOLIC PANEL, BASIC          Collection Time: 05/25/21  5:06 AM         Result  Value  Ref Range            Sodium  134 (L)  136 - 145 mmol/L       Potassium  4.1  3.5 - 5.1 mmol/L       Chloride  104  97 - 108 mmol/L       CO2  27  21 - 32 mmol/L       Anion gap  3 (L)  5 - 15 mmol/L       Glucose  111 (H)  65 - 100 mg/dL       BUN  16  6 - 20 mg/dL       Creatinine  0.95  0.55 - 1.02 mg/dL       BUN/Creatinine ratio  17  12 - 20         eGFR  >60  >60 ml/min/1.107m       Calcium  9.1  8.5 - 10.1 mg/dL       CBC W/O DIFF          Collection Time: 05/25/21  5:06 AM         Result  Value  Ref Range            WBC  12.1 (H)  3.6 - 11.0 K/uL       RBC  4.37  3.80 - 5.20 M/uL       HGB  12.7  11.5 - 16.0 g/dL       HCT  39.6  35.0 - 47.0 %       MCV  90.6  80.0 - 99.0 FL        MCH  29.1  26.0 - 34.0 PG       MCHC  32.1  30.0 - 36.5 g/dL       RDW  15.2 (H)  11.5 - 14.5 %       PLATELET  280  150 - 400 K/uL       MPV  8.5 (L)  8.9 - 12.9 FL       NRBC  0.0  0.0 PER 100 WBC            ABSOLUTE NRBC  0.00  0.00 - 0.01 K/uL                   Assessment/Plan:        Left femur acute fracture   Pain control. IV morphine PRN   Consult orthopedics    Keep NPO after midnight   Hold anti-coagulant.     AM femur fracture repair per orthopedic surgery on 05/26/2021.       Osteopenia   Check vitamin D level. Calcium level within normal limits    Replace as needed.        Recurrent fall   Already has home PT/OT. May need acute rehab after discharge this time. PT/OT evaluation.    Orthostatic vital signs.       COPD   Not in respiratory distress, however diffused crackles on exam.   Chest X-ray normal.   Continue home medications.  Essential hypertension    PO metoprolol        HLP    PO lipitor.          Care Plan discussed with: Patient/Family and Nurse      Total time spent with patient: 30 minutes.    With greater than 50% spent in coordination of care and counseling.      Bernie Covey, MD

## 2021-05-25 NOTE — ED Notes (Signed)
Patient cleaned of urine and new linens given External catheter placed.

## 2021-05-25 NOTE — Progress Notes (Signed)
Received and acknowledged PT orders.  Held PT eval at this time as pt has L hip fx and is awaiting ortho consult.  Will hold PT eval at this time until plan of care obtained by orthopedics. Will need new orders when/if pt undergoes surgery and updated weightbearing status.  Thank you!

## 2021-05-25 NOTE — Progress Notes (Signed)
Problem: Pressure Injury - Risk of  Goal: *Prevention of pressure injury  Description: Document Braden Scale and appropriate interventions in the flowsheet.  Outcome: Progressing Towards Goal  Note: Pressure Injury Interventions:       Moisture Interventions: Internal/External urinary devices, Check for incontinence Q2 hours and as needed, Minimize layers    Activity Interventions: PT/OT evaluation    Mobility Interventions: Turn and reposition approx. every two hours(pillow and wedges), HOB 30 degrees or less, PT/OT evaluation    Nutrition Interventions: Document food/fluid/supplement intake                     Problem: Patient Education: Go to Patient Education Activity  Goal: Patient/Family Education  Outcome: Progressing Towards Goal     Problem: Falls - Risk of  Goal: *Absence of Falls  Description: Document [name] Fall Risk and appropriate interventions in the flowsheet.  Outcome: Progressing Towards Goal  Note: Fall Risk Interventions:                                Problem: Patient Education: Go to Patient Education Activity  Goal: Patient/Family Education  Outcome: Progressing Towards Goal     Problem: Patient Education: Go to Patient Education Activity  Goal: Patient/Family Education  Outcome: Progressing Towards Goal

## 2021-05-25 NOTE — Progress Notes (Signed)
Progress Notes by Ivonne Andrew, MSW at 05/25/21 1342                Author: Ivonne Andrew, MSW  Service: CASE MANAGEMENT  Author Type: Social Worker       Filed: 05/25/21 1402  Date of Service: 05/25/21 1342  Status: Signed          Editor: Ivonne Andrew, MSW (Education officer, museum)               Reason for Admission:  fall                         RUR Score:          13%             Plan for utilizing home health:      politely declined, would  rather go to [facility]       PCP: First and Last name:   Cioflec, Adela Ports, MD       Name of Practice:     Are you a current patient: Yes/No: Yes    Approximate date of last visit: 05/05/21    Can you participate in a virtual visit with your PCP:                       Current Advanced Directive/Advance Care Plan: Full Code   CM confirmed code status with patient.       Healthcare Decision Maker:    Click  here to complete Haematologist of the  Healthcare Decision Maker Relationship (ie "Primary")               Primary Decision Maker: Wellington Riding [phone]                    Transition of Care Plan:                         CM met with patient at bedside to complete DCP assessment. Patient lives alone in a single story home accessible by four steps. She uses a cane, walker, or wheelchair for mobility, and completes ADLs without assistance. Patient's friends drive her wherever  she needs to go. Patient declined home health services, but would like to go to [facility] at time of discharge. Current discharge dispo is IRF. CM will continue to follow.

## 2021-05-25 NOTE — Consults (Signed)
Consults by Helene Kelp, MD at 05/25/21 1401                Author: Helene Kelp, MD  Service: Orthopedic Surgery  Author Type: Physician       Filed: 05/25/21 1421  Date of Service: 05/25/21 1401  Status: Signed          Editor: Helene Kelp, MD (Physician)            Consult Orders        1. IP CONSULT TO ORTHOPEDIC SURGERY [[id number]] ordered by Theotis Barrio, MD at 05/24/21 1943                                      ORTHOPEDIC CONSULT          Patient: [name]  MRN: [id number]   SSN: [id number]          Date of Birth: [birth year]   Age: 76 y.o.   Sex: female           Subjective:         [name] is a 76 y.o. female who is being seen for a left hip periprosthetic fracture.  She sustained a left femoral neck fracture on 01/25/21 and underwent a left hip bipolar hemiarthroplasty on 01/26/2021 by Dr. Garald Braver.  The implant was a  press-fit DePuy Actis stem.  The patient fell yesterday sustaining a left hip periprosthetic fracture.  She also notes lateral distal thigh pain in the area of the IT band.  She denies any hip pain or knee pain prior to falling.  She is unable to walk.        Past Medical History:        Diagnosis  Date         ?  Chronic obstructive pulmonary disease (HCC)       ?  Hypercholesteremia       ?  Insomnia           ?  Sleep disorder          No past surgical history on file.    History reviewed. No pertinent family history.     Social History          Tobacco Use         ?  Smoking status:  Former              Packs/day:  1.00         Types:  Cigarettes         Quit date:  2021         Years since quitting:  2.3         Passive exposure:  Past         ?  Smokeless tobacco:  Never       Substance Use Topics         ?  Alcohol use:  Yes              Alcohol/week:  1.0 standard drink         Types:  1 Cans of beer per week             Comment: 1 can of beer every 1-2 weeks           Prior to Admission medications  Medication  Sig  Start Date  End Date  Taking?   Authorizing Provider            traMADoL (Ultram) 50 mg tablet  Take 1 Tablet by mouth every six (6) hours as needed for Pain for up to 3 days. Max Daily Amount: 200 mg.  05/24/21  05/27/21  Yes  Roland RackBaird, Crystal M, NP            atorvastatin (LIPITOR) 40 mg tablet  Take 1 Tablet by mouth daily.  12/23/20    Yes  Provider, Historical     metoprolol succinate (TOPROL-XL) 25 mg XL tablet  Take 1 Tablet by mouth daily.  12/30/20    Yes  Provider, Historical     fluticasone-umeclidin-vilanter (TRELEGY ELLIPTA) 200-62.5-25 mcg inhaler  Trelegy Ellipta 200 mcg-62.5 mcg-25 mcg powder for inhalation    INHALE 1 PUFF BY MOUTH ONCE A DAY  08/19/20    Yes  Provider, Historical     albuterol (PROVENTIL HFA, VENTOLIN HFA, PROAIR HFA) 90 mcg/actuation inhaler  albuterol sulfate HFA 90 mcg/actuation aerosol inhaler    INHALE 2 PUFFS EVERY 6 HOURS AS NEEDED      Yes  Provider, Historical     omeprazole (PRILOSEC) 20 mg capsule  Take 1 Capsule by mouth daily.  08/11/20    Yes  Provider, Historical     polyethylene glycol (MIRALAX) 17 gram packet  Take 1 Packet by mouth daily.   Patient not taking: Reported on 05/25/2021  01/29/21      Marton RedwoodMcCuskey, Kaitlin J, PA     senna-docusate (PERICOLACE) 8.6-50 mg per tablet  Take 1 Tablet by mouth two (2) times a day.   Patient not taking: Reported on 05/25/2021  01/28/21      Marton RedwoodMcCuskey, Kaitlin J, PA            acetaminophen (TYLENOL) 325 mg tablet  Take 325 mg by mouth every four (4) hours as needed for Pain (Take 2 tablets by mouth as needed for pain).        Provider, Historical             Allergies        Allergen  Reactions         ?  Cheese  Nausea and Vomiting             Severe vomiting           Review of Systems:   ROS, see HPI medicine note           Objective:          Current Facility-Administered Medications          Medication  Dose  Route  Frequency           ?  albuterol (PROVENTIL HFA, VENTOLIN HFA, PROAIR HFA) inhaler 2 Puff   2 Puff  Inhalation  Q4H PRN     ?  budesonide-formoteroL (SYMBICORT)  160-4.5 mcg/actuation HFA inhaler 2 Puff   2 Puff  Inhalation  BID RT          And           ?  tiotropium bromide (SPIRIVA RESPIMAT) 2.5 mcg /actuation   2 Puff  Inhalation  DAILY           ?  senna-docusate (PERICOLACE) 8.6-50 mg per tablet 1 Tablet   1 Tablet  Oral  BID     ?  pantoprazole (PROTONIX) tablet 40 mg  40 mg  Oral  DAILY     ?  metoprolol succinate (TOPROL-XL) XL tablet 25 mg   25 mg  Oral  DAILY     ?  atorvastatin (LIPITOR) tablet 40 mg   40 mg  Oral  DAILY     ?  sodium chloride (NS) flush 5-40 mL   5-40 mL  IntraVENous  Q8H     ?  sodium chloride (NS) flush 5-40 mL   5-40 mL  IntraVENous  PRN     ?  acetaminophen (TYLENOL) tablet 650 mg   650 mg  Oral  Q6H PRN          Or           ?  acetaminophen (TYLENOL) suppository 650 mg   650 mg  Rectal  Q6H PRN     ?  polyethylene glycol (MIRALAX) packet 17 g   17 g  Oral  DAILY PRN     ?  ondansetron (ZOFRAN ODT) tablet 4 mg   4 mg  Oral  Q8H PRN          Or           ?  ondansetron (ZOFRAN) injection 4 mg   4 mg  IntraVENous  Q6H PRN     ?  morphine injection 2 mg   2 mg  IntraVENous  Q4H PRN           ?  morphine injection 4 mg   4 mg  IntraVENous  Q4H PRN           Vitals:             05/25/21 0758  05/25/21 1004  05/25/21 1008  05/25/21 1120           BP:  (!) 89/61      100/65     Pulse:  (!) 105      (!) 102     Resp:  18      18     Temp:  97.5 F (36.4 C)      97.7 F (36.5 C)     SpO2:  95%  96%  95%  93%     Weight:                   Height:                    Alert and oriented x3, No apparent distress   Physical Exam:   Nonlabored respirations   Nontender abdomen   Left lower extremity: The left leg is resting in slight external rotation due to patient comfort.  She is tender about the left IT band at the level of the lateral femoral condyle.  She has moderate pain in her hip with logroll.  She has no knee pain  or ankle pain when stressed.  She is neurovascular intact at the left ankle with palpable DP and normal sensation to light touch  in all peripheral nerve distributions.      Labs:   CBC:     Recent Labs            05/25/21   0506  05/24/21   1844     WBC  12.1*  12.5*     RBC  4.37  4.32     HGB  12.7  12.6     HCT  39.6  39.0     MCV  90.6  90.3     RDW  15.2*  15.2*         PLT  280  282        CHEMISTRIES:     Recent Labs            05/25/21   0506  05/24/21   1749     NA  134*  135*     K  4.1  5.0     CL  104  105     CO2  27  23     BUN  16  13     CREA  0.95  0.87         CA  9.1  9.5     PT/INR:     Recent Labs           05/24/21   1851        INR  1.0        APTT:     Recent Labs           05/24/21   1851        APTT  27.4        LIVER PROFILE:No results for input(s): AST, ALT in the last 72 hours.      No lab exists for component: BILIDIR, BILITOT, ALKPHOS      IMAGING:   Radiographs and CT of the left hip demonstrates fracture of the greater and lesser trochanter with subsidence of the stem when compared to the postoperative radiographs.  No fracture is seen below the lesser trochanter region.        Assessment/Plan:     76 year old female with left hip periprosthetic fracture with unstable stem.  Plan is for return to the OR for implant removal and placement of a diaphyseal fitting stem.  Implants  will need to be brought in from outside.  The set should be available by later today and the plan is for OR tomorrow for revision left hemiarthroplasty around 1000 hrs.      Diagnosis was discussed with the patient. Treatment options were discussed including conservative and surgical treatment.  Risks and benefits of both options were discussed.  The risks of surgery including leg length changes, dislocation, infection, nerve  and vessel injury, blood loss, hardware related problems, fracture, deformity, stiffness, funtional deficits, chronic pain, posttraumatic arthritis, possible need for future surgery, need for blood transfusion, use of allograft/cadaver tissue, blood clots,  stroke, heart attack, death were discussed.  The patient  wishes to proceed with the recommended surgical treatment of the fracture as the definitive treatment.  The patient appears to understand the various issues discussed today, and wishes to proceed  with the proposed treatment.       N.p.o. at midnight           Hospital Problems   Date Reviewed: 01/26/2021                            Codes  Class  Noted  POA              Femur fracture [facility])  ICD-10-CM: E52.77OE   ICD-9-CM: 821.00    05/24/2021  Unknown                        Signed By:  Helene Kelp, MD           May 25, 2021

## 2021-05-25 NOTE — Anesthesia Pre-Procedure Evaluation (Signed)
Relevant Problems   No relevant active problems       Anesthetic History   No history of anesthetic complications            Review of Systems / Medical History  Patient summary reviewed, nursing notes reviewed and pertinent labs reviewed    Pulmonary    COPD            Comments: Former smoker.    Neuro/Psych             Comments: Syncope.  Cardiovascular              Hyperlipidemia      Comments: ECHO 11/2020:    Interpretation Summary  .  LeftVentricle: Low normal left ventricular systolic function with a visually estimated EF of 50 - 55%. Left ventricle is smaller than normal. Mild septal thickening. Mild posterior thickening. See diagram for wall motion findings. Grade I diastolic dysfunction with normal LAP.  Marland Kitchen  RightVentricle: Reduced systolic function.  .  AorticValve: Tricuspid valve. Mild sclerosis of the aortic valve cusp. Mild regurgitation.  .  MitralValve: Valve structure is normal. Mild annular calcification at the posterior leaflet of the mitral valve. Mild to moderate paravalvular regurgitation.  .  RightAtrium: Right atrium is mildly dilated.  .  Technical qualifiers: Echo study was technically difficult due to patient's body habitus.    EKG 05-25-21: Sinus tachycardia   Cannot rule out Anterior infarct , age undetermined   Abnormal ECG   No previous ECGs available.      GI/Hepatic/Renal                Endo/Other            Comments: Sepsis.   Other Findings   Comments: Left hip periprosthetic fracture.         Physical Exam    Airway  Mallampati: II  TM Distance: 4 - 6 cm  Neck ROM: normal range of motion        Cardiovascular    Rhythm: regular  Rate: normal         Dental    Dentition: Poor dentition  Comments: Dentures.    Pulmonary  Breath sounds clear to auscultation               Abdominal  Abdominal exam normal       Other Findings   Comments: 05/25/21 05:06  Sodium: 134 (L)  Potassium: 4.1  Chloride: 104  CO2: 27  Anion gap: 3 (L)  Glucose: 111 (H)  BUN: 16  Creatinine:  0.95  BUN/Creatinine ratio: 17  Calcium: 9.1  eGFR: >60      05/25/21 05:06  WBC: 12.1 (H)  NRBC: 0.0  RBC: 4.37  HGB: 12.7  HCT: 39.6  MCV: 90.6  MCH: 29.1  MCHC: 32.1  RDW: 15.2 (H)  PLATELET: 280  MPV: 8.5 (L)      (H): Data is abnormally high  (L): Data is abnormally low      05/24/21 18:51  INR: 1.0  Prothrombin time: 13.4  aPTT: 27.4         Anesthetic Plan    ASA: 3  Anesthesia type: general    Monitoring Plan: Continuous noninvasive hemodynamic monitoring      Induction: Intravenous  Anesthetic plan and risks discussed with: Patient

## 2021-05-25 NOTE — ED Notes (Signed)
Bed Coordinator called in regards to patient's telemetry order and tele box. States she will put in the request when she returns to her office.

## 2021-05-26 ENCOUNTER — Inpatient Hospital Stay: Payer: MEDICARE | Primary: Internal Medicine

## 2021-05-26 ENCOUNTER — Inpatient Hospital Stay: Admit: 2021-05-26 | Payer: MEDICARE | Primary: Internal Medicine

## 2021-05-26 MED ORDER — SODIUM CHLORIDE 0.9 % IV PIGGY BACK
1000 mg/10 mL (100 mg/mL) | INTRAVENOUS | Status: DC | PRN
Start: 2021-05-26 — End: 2021-05-26
  Administered 2021-05-26 (×2): via INTRAVENOUS

## 2021-05-26 MED ORDER — LIDOCAINE (PF) 20 MG/ML (2 %) IJ SOLN
20 mg/mL (2 %) | INTRAMUSCULAR | Status: AC
Start: 2021-05-26 — End: ?

## 2021-05-26 MED ORDER — PHENYLEPHRINE 1 MG/10 ML (100 MCG/ML) IN NS IV SYRINGE
1 mg/0 mL (00 mcg/mL) | INTRAVENOUS | Status: AC
Start: 2021-05-26 — End: ?

## 2021-05-26 MED ORDER — LACTATED RINGERS IV
INTRAVENOUS | Status: DC | PRN
Start: 2021-05-26 — End: 2021-05-26
  Administered 2021-05-26 (×2): via INTRAVENOUS

## 2021-05-26 MED ORDER — WATER FOR INJECTION, STERILE INJECTION
1 gram | Freq: Three times a day (TID) | INTRAMUSCULAR | Status: DC
Start: 2021-05-26 — End: 2021-05-28
  Administered 2021-05-27 – 2021-05-28 (×4): via INTRAVENOUS

## 2021-05-26 MED ORDER — ROPIVACAINE (PF) 5 MG/ML (0.5 %) INJECTION
5 mg/mL (0. %) | INTRAMUSCULAR | Status: DC | PRN
Start: 2021-05-26 — End: 2021-05-26
  Administered 2021-05-26: 19:00:00

## 2021-05-26 MED ORDER — TRAMADOL 50 MG TAB
50 mg | Freq: Four times a day (QID) | ORAL | Status: AC | PRN
Start: 2021-05-26 — End: 2021-05-28
  Administered 2021-05-27 (×3): via ORAL

## 2021-05-26 MED ORDER — PROPOFOL 10 MG/ML IV EMUL
10 mg/mL | INTRAVENOUS | Status: AC
Start: 2021-05-26 — End: ?

## 2021-05-26 MED ORDER — EPINEPHRINE 1 MG/ML IJ SOLN
1 mg/mL | INTRAMUSCULAR | Status: DC | PRN
Start: 2021-05-26 — End: 2021-05-26
  Administered 2021-05-26: 19:00:00

## 2021-05-26 MED ORDER — AMINOCAPROIC ACID 250 MG/ML IV SOLN
250 mg/mL | INTRAVENOUS | Status: DC
Start: 2021-05-26 — End: 2021-05-26

## 2021-05-26 MED ORDER — ROPIVACAINE (PF) 5 MG/ML (0.5 %) INJECTION
5 mg/mL (0. %) | INTRAMUSCULAR | Status: AC
Start: 2021-05-26 — End: ?

## 2021-05-26 MED ORDER — DEXAMETHASONE SODIUM PHOSPHATE 4 MG/ML IJ SOLN
4 mg/mL | INTRAMUSCULAR | Status: AC
Start: 2021-05-26 — End: ?

## 2021-05-26 MED ORDER — KETOROLAC TROMETHAMINE 30 MG/ML INJECTION
30 mg/mL (1 mL) | INTRAMUSCULAR | Status: DC | PRN
Start: 2021-05-26 — End: 2021-05-26
  Administered 2021-05-26: 19:00:00

## 2021-05-26 MED ORDER — DEXAMETHASONE SODIUM PHOSPHATE 4 MG/ML IJ SOLN
4 mg/mL | INTRAMUSCULAR | Status: DC | PRN
Start: 2021-05-26 — End: 2021-05-26
  Administered 2021-05-26: 16:00:00 via INTRAVENOUS

## 2021-05-26 MED ORDER — ACETAMINOPHEN 325 MG TABLET
325 mg | Freq: Four times a day (QID) | ORAL | Status: DC
Start: 2021-05-26 — End: 2021-05-27
  Administered 2021-05-26 – 2021-05-27 (×2): via ORAL

## 2021-05-26 MED ORDER — FENTANYL CITRATE (PF) 50 MCG/ML IJ SOLN
50 mcg/mL | INTRAMUSCULAR | Status: DC | PRN
Start: 2021-05-26 — End: 2021-05-26
  Administered 2021-05-26: 16:00:00 via INTRAVENOUS

## 2021-05-26 MED ORDER — CEFAZOLIN 1 GRAM SOLUTION FOR INJECTION
1 gram | INTRAMUSCULAR | Status: DC | PRN
Start: 2021-05-26 — End: 2021-05-26
  Administered 2021-05-26 (×2): via INTRAVENOUS

## 2021-05-26 MED ORDER — CLONIDINE (PF) 1,000 MCG/10 ML (100 MCG/ML) EPIDURAL SOLUTION
1000 mcg/10 mL (100 mcg/mL) | EPIDURAL | Status: AC
Start: 2021-05-26 — End: ?

## 2021-05-26 MED ORDER — ROCURONIUM 10 MG/ML IV
10 mg/mL | INTRAVENOUS | Status: DC | PRN
Start: 2021-05-26 — End: 2021-05-26
  Administered 2021-05-26 (×2): via INTRAVENOUS

## 2021-05-26 MED ORDER — PROPOFOL 10 MG/ML IV EMUL
10 mg/mL | INTRAVENOUS | Status: DC | PRN
Start: 2021-05-26 — End: 2021-05-26
  Administered 2021-05-26 (×2): via INTRAVENOUS

## 2021-05-26 MED ORDER — ASPIRIN 81 MG CHEWABLE TAB
81 mg | Freq: Two times a day (BID) | ORAL | Status: DC
Start: 2021-05-26 — End: 2021-05-28
  Administered 2021-05-27 – 2021-05-28 (×3): via ORAL

## 2021-05-26 MED ORDER — KETOROLAC TROMETHAMINE 30 MG/ML INJECTION
30 mg/mL (1 mL) | INTRAMUSCULAR | Status: AC
Start: 2021-05-26 — End: ?

## 2021-05-26 MED ORDER — SODIUM CHLORIDE 0.9 % IV
INTRAVENOUS | Status: DC | PRN
Start: 2021-05-26 — End: 2021-05-26
  Administered 2021-05-26 (×2): via INTRAVENOUS

## 2021-05-26 MED ORDER — HYDROMORPHONE (PF) 1 MG/ML IJ SOLN
1 mg/mL | INTRAMUSCULAR | Status: AC
Start: 2021-05-26 — End: ?

## 2021-05-26 MED ORDER — CELECOXIB 100 MG CAP
100 mg | Freq: Two times a day (BID) | ORAL | Status: AC
Start: 2021-05-26 — End: 2021-05-28
  Administered 2021-05-27 – 2021-05-28 (×3): via ORAL

## 2021-05-26 MED ORDER — ONDANSETRON (PF) 4 MG/2 ML INJECTION
4 mg/2 mL | INTRAMUSCULAR | Status: DC | PRN
Start: 2021-05-26 — End: 2021-05-26
  Administered 2021-05-26: 16:00:00 via INTRAVENOUS

## 2021-05-26 MED ORDER — CEFAZOLIN 1 GRAM SOLUTION FOR INJECTION
1 gram | INTRAMUSCULAR | Status: AC
Start: 2021-05-26 — End: ?

## 2021-05-26 MED ORDER — MORPHINE 2 MG/ML INJECTION
2 mg/mL | Freq: Four times a day (QID) | INTRAMUSCULAR | Status: DC | PRN
Start: 2021-05-26 — End: 2021-05-26

## 2021-05-26 MED ORDER — FENTANYL CITRATE (PF) 50 MCG/ML IJ SOLN
50 mcg/mL | INTRAMUSCULAR | Status: AC
Start: 2021-05-26 — End: ?

## 2021-05-26 MED ORDER — ONDANSETRON (PF) 4 MG/2 ML INJECTION
4 mg/2 mL | INTRAMUSCULAR | Status: AC
Start: 2021-05-26 — End: ?

## 2021-05-26 MED ORDER — SODIUM CHLORIDE 0.9 % INJECTION
INTRAMUSCULAR | Status: DC | PRN
Start: 2021-05-26 — End: 2021-05-26
  Administered 2021-05-26: 19:00:00

## 2021-05-26 MED ORDER — SODIUM CHLORIDE 0.9 % IV
250 mg/mL | INTRAVENOUS | Status: DC
Start: 2021-05-26 — End: 2021-05-26

## 2021-05-26 MED ORDER — HYDROMORPHONE 1 MG/ML INJECTION SOLUTION
1 mg/mL | INTRAMUSCULAR | Status: DC | PRN
Start: 2021-05-26 — End: 2021-05-26
  Administered 2021-05-26: 17:00:00 via INTRAVENOUS

## 2021-05-26 MED ORDER — PHENYLEPHRINE 10 MG/ML INJECTION
10 mg/mL | INTRAMUSCULAR | Status: DC | PRN
Start: 2021-05-26 — End: 2021-05-26
  Administered 2021-05-26 (×3): via INTRAVENOUS

## 2021-05-26 MED ORDER — EPINEPHRINE (PF) 1 MG/ML INJECTION
1 mg/mL ( mL) | INTRAMUSCULAR | Status: AC
Start: 2021-05-26 — End: ?

## 2021-05-26 MED ORDER — NEOSTIGMINE METHYLSULFATE 1 MG/ML INTRAVENOUS SOLUTION
1 mg/mL | INTRAVENOUS | Status: DC | PRN
Start: 2021-05-26 — End: 2021-05-26
  Administered 2021-05-26: 20:00:00 via INTRAVENOUS

## 2021-05-26 MED ORDER — GLYCOPYRROLATE 0.2 MG/ML IJ SOLN
0.2 mg/mL | INTRAMUSCULAR | Status: DC | PRN
Start: 2021-05-26 — End: 2021-05-26
  Administered 2021-05-26: 20:00:00 via INTRAVENOUS

## 2021-05-26 MED ORDER — CLONIDINE (PF) 1,000 MCG/10 ML (100 MCG/ML) EPIDURAL SOLUTION
1000 mcg/10 mL (100 mcg/mL) | EPIDURAL | Status: DC | PRN
Start: 2021-05-26 — End: 2021-05-26
  Administered 2021-05-26: 19:00:00

## 2021-05-26 MED ORDER — LIDOCAINE (PF) 20 MG/ML (2 %) IJ SOLN
20 mg/mL (2 %) | INTRAMUSCULAR | Status: DC | PRN
Start: 2021-05-26 — End: 2021-05-26
  Administered 2021-05-26: 16:00:00 via INTRAVENOUS

## 2021-05-26 MED ORDER — TRANEXAMIC ACID 1,000 MG/10 ML (100 MG/ML) IV
1000 mg/10 mL (100 mg/mL) | INTRAVENOUS | Status: AC
Start: 2021-05-26 — End: ?

## 2021-05-26 MED ORDER — BUPIVACAINE 0.5 % (5 MG/ML) IJ SOLN
0.5 % (5 mg/mL) | INTRAMUSCULAR | Status: AC
Start: 2021-05-26 — End: ?

## 2021-05-26 MED ORDER — ROCURONIUM 10 MG/ML IV
10 mg/mL | INTRAVENOUS | Status: AC
Start: 2021-05-26 — End: ?

## 2021-05-26 MED FILL — FENTANYL CITRATE (PF) 50 MCG/ML IJ SOLN: 50 mcg/mL | INTRAMUSCULAR | Qty: 2

## 2021-05-26 MED FILL — CELECOXIB 100 MG CAP: 100 mg | ORAL | Qty: 1

## 2021-05-26 MED FILL — HYDROMORPHONE (PF) 1 MG/ML IJ SOLN: 1 mg/mL | INTRAMUSCULAR | Qty: 1

## 2021-05-26 MED FILL — DURACLON (PF) 1,000 MCG/10 ML (100 MCG/ML) EPIDURAL SOLUTION: 1000 mcg/10 mL (100 mcg/mL) | EPIDURAL | Qty: 10

## 2021-05-26 MED FILL — CEFAZOLIN 1 GRAM SOLUTION FOR INJECTION: 1 gram | INTRAMUSCULAR | Qty: 2000

## 2021-05-26 MED FILL — TYLENOL 325 MG TABLET: 325 mg | ORAL | Qty: 2

## 2021-05-26 MED FILL — XYLOCAINE-MPF 20 MG/ML (2 %) INJECTION SOLUTION: 20 mg/mL (2 %) | INTRAMUSCULAR | Qty: 5

## 2021-05-26 MED FILL — PROPOFOL 10 MG/ML IV EMUL: 10 mg/mL | INTRAVENOUS | Qty: 20

## 2021-05-26 MED FILL — METOPROLOL SUCCINATE SR 25 MG 24 HR TAB: 25 mg | ORAL | Qty: 1

## 2021-05-26 MED FILL — BUPIVACAINE 0.5 % (5 MG/ML) IJ SOLN: 0.5 % (5 mg/mL) | INTRAMUSCULAR | Qty: 50

## 2021-05-26 MED FILL — DEXAMETHASONE SODIUM PHOSPHATE 4 MG/ML IJ SOLN: 4 mg/mL | INTRAMUSCULAR | Qty: 1

## 2021-05-26 MED FILL — ROCURONIUM 10 MG/ML IV: 10 mg/mL | INTRAVENOUS | Qty: 5

## 2021-05-26 MED FILL — NAROPIN (PF) 5 MG/ML (0.5 %) INJECTION SOLUTION: 5 mg/mL (0. %) | INTRAMUSCULAR | Qty: 30

## 2021-05-26 MED FILL — SPIRIVA RESPIMAT 2.5 MCG/ACTUATION SOLUTION FOR INHALATION: 2.5 mcg/actuation | RESPIRATORY_TRACT | Qty: 4

## 2021-05-26 MED FILL — ATORVASTATIN 40 MG TAB: 40 mg | ORAL | Qty: 1

## 2021-05-26 MED FILL — AMINOCAPROIC ACID 250 MG/ML IV SOLN: 250 mg/mL | INTRAVENOUS | Qty: 20

## 2021-05-26 MED FILL — PHENYLEPHRINE 1 MG/10 ML (100 MCG/ML) IN NS IV SYRINGE: 1 mg/0 mL (00 mcg/mL) | INTRAVENOUS | Qty: 10

## 2021-05-26 MED FILL — TRANEXAMIC ACID 1,000 MG/10 ML (100 MG/ML) IV: 1000 mg/10 mL (100 mg/mL) | INTRAVENOUS | Qty: 20

## 2021-05-26 MED FILL — ONDANSETRON (PF) 4 MG/2 ML INJECTION: 4 mg/2 mL | INTRAMUSCULAR | Qty: 2

## 2021-05-26 MED FILL — PANTOPRAZOLE 40 MG TAB, DELAYED RELEASE: 40 mg | ORAL | Qty: 1

## 2021-05-26 MED FILL — KETOROLAC TROMETHAMINE 30 MG/ML INJECTION: 30 mg/mL (1 mL) | INTRAMUSCULAR | Qty: 1

## 2021-05-26 MED FILL — EPINEPHRINE (PF) 1 MG/ML INJECTION: 1 mg/mL ( mL) | INTRAMUSCULAR | Qty: 1

## 2021-05-26 MED FILL — SYMBICORT 160 MCG-4.5 MCG/ACTUATION HFA AEROSOL INHALER: RESPIRATORY_TRACT | Qty: 6

## 2021-05-26 MED FILL — STIMULANT LAXATIVE PLUS 8.6 MG-50 MG TABLET: ORAL | Qty: 1

## 2021-05-26 NOTE — Progress Notes (Signed)
Progress Notes by Moody Bruins, PA-C at 05/26/21 2841                Author: Moody Bruins, PA-C  Service: Physician Assistant  Author Type: Physician Assistant       Filed: 05/26/21 0805  Date of Service: 05/26/21 0804  Status: Signed           Editor: Lenny Pastel (Physician Assistant)  Cosigner: Golden Pop, MD at 05/26/21 1003                    Orthopedic progress note      Date:05/26/2021       Room:439/01   Patient Name:[name]     Date of Birth:01-24-1945     Age:75 y.o.            Subjective       76 year old female seen in follow-up for periprosthetic fracture left hip.   Patient is lying comfortably in bed.  She does complain of mild discomfort of the left hip exacerbated with any movement of the left lower extremity.   No other complaints at this time.   Objective                      Vitals Last 24 Hours:   TEMPERATURE:  Temp  Avg: 97.7 F (36.5 C)  Min: 97.3 F (36.3 C)  Max: 98 F (36.7 C)   RESPIRATIONS RANGE: Resp  Avg: 18  Min: 18  Max: 18   PULSE OXIMETRY RANGE: SpO2  Avg: 93.8 %  Min: 91 %  Max: 96 %   PULSE RANGE: Pulse  Avg: 99.1  Min: 93  Max: 108   BLOOD PRESSURE RANGE: Systolic (32GMW), NUU:725 , Min:100 , DGU:440    ; Diastolic (34VQQ), VZD:63, Min:63, Max:73        Current Facility-Administered Medications          Medication  Dose  Route  Frequency           ?  albuterol (PROVENTIL HFA, VENTOLIN HFA, PROAIR HFA) inhaler 2 Puff   2 Puff  Inhalation  Q4H PRN     ?  budesonide-formoteroL (SYMBICORT) 160-4.5 mcg/actuation HFA inhaler 2 Puff   2 Puff  Inhalation  BID RT          And           ?  tiotropium bromide (SPIRIVA RESPIMAT) 2.5 mcg /actuation   2 Puff  Inhalation  DAILY           ?  senna-docusate (PERICOLACE) 8.6-50 mg per tablet 1 Tablet   1 Tablet  Oral  BID           ?  pantoprazole (PROTONIX) tablet 40 mg   40 mg  Oral  DAILY     ?  metoprolol succinate (TOPROL-XL) XL tablet 25 mg   25 mg  Oral  DAILY     ?  atorvastatin (LIPITOR) tablet 40 mg   40  mg  Oral  DAILY     ?  sodium chloride (NS) flush 5-40 mL   5-40 mL  IntraVENous  Q8H     ?  sodium chloride (NS) flush 5-40 mL   5-40 mL  IntraVENous  PRN     ?  acetaminophen (TYLENOL) tablet 650 mg   650 mg  Oral  Q6H PRN          Or           ?  acetaminophen (TYLENOL) suppository 650 mg   650 mg  Rectal  Q6H PRN     ?  polyethylene glycol (MIRALAX) packet 17 g   17 g  Oral  DAILY PRN     ?  ondansetron (ZOFRAN ODT) tablet 4 mg   4 mg  Oral  Q8H PRN          Or           ?  ondansetron (ZOFRAN) injection 4 mg   4 mg  IntraVENous  Q6H PRN     ?  morphine injection 2 mg   2 mg  IntraVENous  Q4H PRN           ?  morphine injection 4 mg   4 mg  IntraVENous  Q4H PRN               I/O (24Hr):   No intake or output data in the 24 hours ending 05/26/21 0804   Objective:   Vital signs: (most recent): Blood pressure 110/64, pulse 97, temperature 98 F (36.7 C), resp. rate 18, height 5' 3"  (1.6 m), weight 47.6 kg (105 lb), SpO2 93 %.        Labs/Imaging/Diagnostics       Labs:   CBC:     Recent Labs            05/25/21   0506  05/24/21   1844     WBC  12.1*  12.5*     RBC  4.37  4.32     HGB  12.7  12.6     HCT  39.6  39.0     MCV  90.6  90.3     RDW  15.2*  15.2*         PLT  280  282        CHEMISTRIES:     Recent Labs            05/25/21   0506  05/24/21   1749     NA  134*  135*     K  4.1  5.0     CL  104  105     CO2  27  23     BUN  16  13     CREA  0.95  0.87         CA  9.1  9.5     PT/INR:     Recent Labs           05/24/21   1851        INR  1.0        APTT:     Recent Labs           05/24/21   1851        APTT  27.4        LIVER PROFILE:No results for input(s): AST, ALT in the last 72 hours.      No lab exists for component: Adrian Blackwater, ALKPHOS     Lab Results         Component  Value  Date/Time            ALT (SGPT)  16  01/28/2021 08:50 AM       AST (SGOT)  22  01/28/2021 08:50 AM       Alk. phosphatase  102  01/28/2021 08:50 AM            Bilirubin, total  0.6  01/28/2021 08:50 AM           Physical  Exam:   Left lower extremity: No malrotation or foreshortening seen.  Skin warm dry and intact throughout.  No erythema ecchymosis seen.  No calf pain to palpation.  DP/PT pulses palpable.  EHL/DF/PF 4 out  of 5.  Negative Homans.  Left lower extremity neurovascularly intact.      Assessment//Plan                        Patient Active Problem List           Diagnosis  Date Noted         ?  Femur fracture ([place])  05/24/2021     ?  Closed fracture of neck of left femur ([place])  01/26/2021     ?  Hip fracture ([place])  01/25/2021     ?  Sepsis ([place])  01/02/2021     ?  Cardiac syncope  11/27/2020         ?  Ambulatory dysfunction  11/27/2020        Status post left hip/proximal femur periprosthetic fracture.   Plan for the OR today with Dr. Tamala Julian.   SCDs bilateral lower extremities.   Ice therapy left hip.         Electronically signed by Moody Bruins, PA-C on 05/26/2021 at 8:04 AM

## 2021-05-26 NOTE — Interval H&P Note (Signed)
Called patients friend Normand Sloop and updated that patient is in recovery and doing well.

## 2021-05-26 NOTE — Progress Notes (Signed)
Progress Notes by Deretha Emory, LPN at 62/37/62 1142                Author: Deretha Emory, LPN  Service: NURSING  Author Type: Licensed Nurse       Filed: 05/26/21 1142  Date of Service: 05/26/21 1142  Status: Signed          Editor: Deretha Emory, LPN (Licensed Nurse)                  Problem: Pressure Injury - Risk of   Goal: *Prevention of pressure injury   Description: Document Braden Scale and appropriate interventions in the flowsheet.   Outcome: Progressing Towards Goal   Note: Pressure Injury Interventions:   Sensory Interventions: Avoid rigorous massage over bony prominences      Moisture Interventions: Internal/External urinary devices      Activity Interventions: PT/OT evaluation      Mobility Interventions: Turn and reposition approx. every two hours(pillow and wedges)      Nutrition Interventions: Offer support with meals,snacks and hydration, Document food/fluid/supplement intake      Friction and Shear Interventions: Foam dressings/transparent film/skin sealants

## 2021-05-26 NOTE — Op Note (Signed)
Brief Postoperative Note    Patient: [name]  Date of Birth: 30-Dec-1945  MRN: [id number]    Date of Procedure: 05/26/2021     Pre-Op Diagnosis: Left hip periprosthetic fracture with loose stem    Post-Op Diagnosis: Same as above    Procedure(s):  LEFT HIP REVISION POSTERIOR APPROACH WITH ALLOGRAFT    Surgeon(s):  Helene Kelp, MD    Surgical Assistant: Surg Asst-1: Jalene Mullet; Larene Pickett    Anesthesia: General     Estimated Blood Loss (mL): 200    Complications: None    Specimens:   ID Type Source Tests Collected by Time Destination   1 : URINE CULTURE Urine Foley Specimen CULTURE, URINE Helene Kelp, MD 05/26/2021 1215 Microbiology        Implants:   Implant Name Type Inv. Item Serial No. Manufacturer Lot No. LRB No. Used Action   TIGERTAPE CERCLASE 2 MM 48" WITH FIBERLINK SHUTTLE SUTURE   NA ARTHREX [id number] Left 2 Implanted   FIBERTAPE CERCLAGE 2 MM 48" WITH TIGER LINK SHUTTLE SUTURE   NA ARTHREX [id number] Left 1 Implanted   STEM FEM SZ 12 L190MM STD OFFSET HIP TI HA CEMENTLESS REV - SNA  STEM FEM SZ 12 L190MM STD OFFSET HIP TI HA CEMENTLESS REV NA [employer]WD [id number] Left 1 Implanted   TIGERTAPE CERCLAGE 2 MM 48" WITH FIBERLINK SHUTTLE SUTURE   NA ARTHREX [id number] Left 1 Implanted   HEAD BPLR OD45MM ID28MM FEM HIP SELF CNTR - SNA  HEAD BPLR OD45MM ID28MM FEM HIP SELF CNTR NA [employer]WD [id number] Left 1 Implanted   HEAD FEM DIA28MM NK L+1.5MM HIP MTL ON MTL 12/14 TAPR - SNA  HEAD FEM DIA28MM NK L+1.5MM HIP MTL ON MTL 12/14 TAPR NA [employer]WD [id number] Left 1 Implanted   MATRIX CELLULAR BNE 1CC SM -- VIVIGEN FORMABLE - [id number]  MATRIX CELLULAR BNE 1CC SM -- VIVIGEN FORMABLE [id number] [employer]  Left 1 Implanted       Drains:   External Urinary Catheter 05/25/21 (Active)   Site Assessment Clean, dry, & intact 05/25/21 1950   Repositioned Yes 05/25/21 1950   Perineal Care Yes 05/25/21 1950   Wick Changed No  05/25/21 1950   Suction Canister/Tubing Changed No 05/25/21 1950       Findings: Severe comminution about the lesser trochanter and large void of metaphyseal bone about the proximal femur    Electronically Signed by Helene Kelp, MD on 05/26/2021 at 4:15 PM

## 2021-05-26 NOTE — Progress Notes (Signed)
Progress Notes by Deretha Emory, LPN at 28/31/51 1017                Author: Deretha Emory, LPN  Service: NURSING  Author Type: Licensed Nurse       Filed: 05/26/21 1018  Date of Service: 05/26/21 1017  Status: Signed          Editor: Deretha Emory, LPN (Licensed Nurse)               Verbal report given to [facility] in PACU. Removed patients 3 gold wedding bands, placed in drawer of bedside table.

## 2021-05-26 NOTE — Progress Notes (Signed)
Progress Notes by Demetria Pore, MD at 05/26/21 [phone]                Author: Demetria Pore, MD  Service: Hospitalist  Author Type: Physician       Filed: 05/26/21 1747  Date of Service: 05/26/21 0843  Status: Signed          Editor: Demetria Pore, MD (Physician)                                                     HOSPITALIST PROGRESS NOTE   Demetria Pore MD, [facility]     [facility]   [place], [place]              Daily Progress Note: 05/26/2021           Subjective:        Patient is alert and oriented x3.      Pain well controlled for now.      No overnight fever/chills, chest pain, shortness of breath noted.      Plan for femur/hip repair per orthopedic surgery today.         Medications reviewed     Current Facility-Administered Medications          Medication  Dose  Route  Frequency           ?  morphine injection 1 mg   1 mg  IntraVENous  Q6H PRN     ?  cloNIDine (PF) 1,000 mcg/10 mL (100 mcg/mL) epidural infusion       PRN     ?  EPINEPHrine (ADRENALIN) 1 mg/mL injection       PRN     ?  ketorolac (TORADOL) injection       PRN     ?  ROPivacaine (PF) (NAROPIN) 5 mg/mL (0.5 %) injection       PRN     ?  0.9% sodium chloride injection       PRN           ?  albuterol (PROVENTIL HFA, VENTOLIN HFA, PROAIR HFA) inhaler 2 Puff   2 Puff  Inhalation  Q4H PRN           ?  budesonide-formoteroL (SYMBICORT) 160-4.5 mcg/actuation HFA inhaler 2 Puff   2 Puff  Inhalation  BID RT          And           ?  tiotropium bromide (SPIRIVA RESPIMAT) 2.5 mcg /actuation   2 Puff  Inhalation  DAILY     ?  senna-docusate (PERICOLACE) 8.6-50 mg per tablet 1 Tablet   1 Tablet  Oral  BID     ?  pantoprazole (PROTONIX) tablet 40 mg   40 mg  Oral  DAILY     ?  metoprolol succinate (TOPROL-XL) XL tablet 25 mg   25 mg  Oral  DAILY     ?  atorvastatin (LIPITOR) tablet 40 mg   40 mg  Oral  DAILY     ?  sodium chloride (NS) flush 5-40 mL   5-40 mL  IntraVENous  Q8H     ?  sodium  chloride (NS) flush 5-40 mL   5-40 mL  IntraVENous  PRN     ?  acetaminophen (TYLENOL) tablet 650 mg   650 mg  Oral  Q6H PRN          Or           ?  acetaminophen (TYLENOL) suppository 650 mg   650 mg  Rectal  Q6H PRN     ?  polyethylene glycol (MIRALAX) packet 17 g   17 g  Oral  DAILY PRN     ?  ondansetron (ZOFRAN ODT) tablet 4 mg   4 mg  Oral  Q8H PRN          Or           ?  ondansetron (ZOFRAN) injection 4 mg   4 mg  IntraVENous  Q6H PRN          Facility-Administered Medications Ordered in Other Encounters          Medication  Dose  Route  Frequency           ?  PHENYLephrine 100 mcg/mL 10 mL syringe (ONE-STEP)     IntraVENous  CONTINUOUS     ?  dexamethasone (DECADRON) 4 mg/mL injection     IntraVENous  PRN           ?  ondansetron (ZOFRAN) injection     IntraVENous  PRN           ?  fentaNYL citrate (PF) injection     IntraVENous  PRN     ?  lidocaine (PF) (XYLOCAINE) 20 mg/mL (2 %) injection     IntraVENous  PRN     ?  propofoL (DIPRIVAN) 10 mg/mL injection     IntraVENous  PRN     ?  rocuronium injection     IntraVENous  PRN     ?  lactated Ringers infusion     IntraVENous  CONTINUOUS     ?  ceFAZolin (ANCEF) injection     IntraVENous  PRN     ?  tranexamic acid (CYKLOKAPRON) 1 g in 0.9% sodium chloride (MBP/ADV) 110 mL MBP     IntraVENous  CONTINUOUS     ?  HYDROmorphone (DILAUDID) injection     IntraVENous  PRN           ?  0.9% sodium chloride infusion     IntraVENous  CONTINUOUS             Review of Systems:     A comprehensive review of systems was negative except for that written in the HPI.        Objective:     Physical Exam:       Visit Vitals      BP  101/73 (BP 1 Location: Right arm, BP Patient Position: Lying;At rest)     Pulse  90     Temp  98.3 F (36.8 C)     Resp  20     Ht   (1.6 m)     Wt  47.6 kg (105 lb)     SpO2  92%        BMI  18.60 kg/m      O2 Flow Rate (L/min): 1 l/min O2 Device: None (Room air)   Patient Vitals for the past 8 hrs:            Temp  Pulse  Resp  BP  SpO2             05/26/21 1118  98.3 F (36.8 C)  90  20  101/73  92 %            05/26/21 0807  --  --  --  --  93 %     05/26/21 0803  --  --  --  --  95 %     05/26/21 0800  --  90  --  --  --            05/26/21 0755  98 F (36.7 C)  91  18  119/72  93 %              Temp (24hrs), Avg:97.9 F (36.6 C), Min:97.3 F (36.3 C), Max:98.3 F (36.8 C)     05/04 0701 - 05/04 1900   In: 2000 [I.V.:2000]   Out: -    No intake/output data recorded.         General:   Alert, cooperative, no distress, appears stated age.        Lungs:    Clear to auscultation bilaterally.        Chest wall:   No tenderness or deformity.        Heart:   Regular rate and rhythm, S1, S2 normal, no murmur, click, rub or gallop.        Abdomen:    Soft, non-tender. Bowel sounds normal. No masses,  No organomegaly.     Extremities:  Extremities normal, atraumatic, no cyanosis or edema.     Pulses:  2+ and symmetric all extremities.     Skin:  Skin color, texture, turgor normal. No rashes or lesions        Neurologic:  CNII-XII intact. No gross sensory or motor deficits          Data Review:          Recent Days:     Recent Labs            05/25/21   0506  05/24/21   1844     WBC  12.1*  12.5*     HGB  12.7  12.6     HCT  39.6  39.0         PLT  280  282          Recent Labs             05/25/21   0506  05/24/21   1851  05/24/21   1749     NA  134*   --   135*     K  4.1   --   5.0     CL  104   --   105     CO2  27   --   23     GLU  111*   --   124*     BUN  16   --   13     CREA  0.95   --   0.87     CA  9.1   --   9.5          INR   --   1.0   --         No results for input(s): PH, PCO2, PO2, HCO3, FIO2 in the last 72 hours.      24 Hour Results:   No results found for this or any previous visit (from the past 24 hour(s)).  Assessment/Plan:        Left femur acute fracture   Pain control. IV morphine PRN   Consult orthopedics    NPO after midnight   Hold anti-coagulant.     Plan for femur fracture repair per orthopedic surgery  today.       Osteopenia   Check vitamin D level. Calcium level within normal limits    Replace as needed.        Recurrent fall   Already has home PT/OT. May need acute rehab after discharge this time. PT/OT evaluation.    Orthostatic vital signs.       COPD   Not in respiratory distress, however diffused crackles on exam.   Chest X-ray normal.   Continue home medications.         Essential hypertension    PO metoprolol        HLP    PO lipitor.          Care Plan discussed with: Patient/Family and Nurse      Total time spent with patient: 30 minutes.    With greater than 50% spent in coordination of care and counseling.      Demetria Pore, MD

## 2021-05-26 NOTE — Interval H&P Note (Signed)
Patient refused to turn for skin assessment after surgery. What was visible was intact with no sign of pressure injury.     Patient refused temperature reassessment before leaving PACU

## 2021-05-26 NOTE — Progress Notes (Signed)
Progress Notes by Deretha Emory, LPN at 26/20/35 1020                Author: Deretha Emory, LPN  Service: NURSING  Author Type: Licensed Nurse       Filed: 05/26/21 1028  Date of Service: 05/26/21 1020  Status: Signed          Editor: Deretha Emory, LPN (Licensed Nurse)               Transported off unit to OR.

## 2021-05-26 NOTE — Op Note (Signed)
DePuy [employer]ynthes  Corail revision stem 12  Outer head ball 45   Inner head ball 28  Arthrex fiber tape cerclage x4    Indications: The patient sustained a left hip periprosthetic fracture.  Her index left hip hemiarthroplasty surgical date was 26 January 2021 via an anterior approach.  She had a ground-level fall and presented to the ED with this injury.  The patient was educated on the risk and benefits of nonoperative management versus revision left hip hemiarthroplasty with allograft.  The patient desires surgery.  Risks of surgery include pain, scar, infection, dislocation, instability, leg length differences, heterotopic ossification, rotation changes, stiffness, implant loosening, fracture, revision surgery, blood clots, transmission of disease with allograft, pulmonary embolism, heart attack, stroke and death.    Surgery: The patient was placed on the operating table with all bony promises well-padded. The patient then underwent anesthesia without complications. The patient was then positioned in the lateral decubitus. The operative hip and extremity were then prepped and draped in standard fashion.  A surgical timeout was performed indicating that this was the correct patient, procedure, and extremity.  All necessary equipment was available and sterile.  Everyone in the room was in agreement.  The patient received appropriate antibiotics, tranexamic acid, and oral pain control medications as indicated.    A posterior approach was then made in standard fashion.  Electrocautery was used down to the IT band fascia.  The fascia was then incised.  The retractors were placed and the piriformis, short external rotators and capsule were then incised as a single sleeve and tagged for later repair.  The piriformis was not attached to the bone and was retracted approximately 2 cm.  The comminuted proximal femur fracture was immediately seen.  There was no significant amount of scar tissue about the femoral neck prosthesis.   The head balls were easily removed with a bone tamp.  The acetabulum was inspected and deemed to have adequate cartilage with no significant arthrosis.  The femoral stem was loose and easily extracted with the DePuy Actis extractor.  The proximal femur was then inspected for the fracture pattern with most of the comminution about the posterior proximal femur as well as anterior femur.  A Arthrex fiber tape cerclage was then placed just distal to the fracture site, this was below the lesser trochanter.  The incision was extended distally.  The vastus lateralis fascia was incised with electrocautery and the Cobb was used to split the fibers with minimal trauma.  2 separate fiber tape cerclage was were then placed approximately 10 cm and 7 cm below the lesser trochanter to prevent any fracture propagation or new fracture.  Attention was then turned to the femoral stem.  The opening 10 mm reamer was then placed by hand followed by the 11 and 12 mm reamers.  We then began broaching with the 8 and finally up to a 12 Corail broach.  We then placed our trial with standard neck offset and a 1.5 mm inner ball.  The hip was reduced and was deemed stable.  Intraoperative plain films were taken that demonstrated appropriate diaphyseal fit.  There is no anterior cortical breach on the lateral.  Leg lengths were appropriate with approximately 3 to 4 mm of increased length.  The femoral canal was briefly irrigated using low-pressure lavage and the femoral stem was placed in standard fashion with appropriate anteversion.  We then retrialed with the same head balls and it was deemed to have appropriate stability in all  directions.  Leg lengths were adequate.  We then removed the head ball trials and placed the final fiber tape cerclage about the calcar incorporating multiple fragments but only tension to about 34 pounds.  Vivigen was placed about the metaphysis due to significant metaphyseal cancellous bone loss.  The pericapsular  injection was then given.  The trunnion was dried and the final head balls were placed and impacted in standard fashion.  The hip was reduced and underwent stability testing and was stable to approximately 80 degrees of internal rotation both at 90 and 45 degrees of hip flexion.  Irrisept was then placed and the wound was then copiously irrigated.  The short external rotators/piriformis and capsule were then repaired through bone tunnels and through the abductor tendon using horizontal mattress technique.  The IT band and deep adipose layers were then closed with 0 strata fix.  The subdermis and subcuticular layers were closed with 2-0 Vicryl and 3-0 strata fix.  Dermabond and silver dressing were applied.  The patient tolerated the procedure extremely well and was transferred to the PACU in stable condition.    Plan: The patient will be weightbearing as tolerated.  She will be taught posterior hip precautions.  She will be placed on antibiotics for 10 days given her increased risk for infection.  She will follow-up on 19 May with myself.  If she is unable to make that she may follow-up the week afterwards.

## 2021-05-27 LAB — TYPE & SCREEN
ABO/Rh(D): O POS
Antibody screen: NEGATIVE

## 2021-05-27 LAB — TYPE AND SCREEN
ABO/Rh: O POS
Antibody Screen: NEGATIVE

## 2021-05-27 MED ORDER — ACETAMINOPHEN 500 MG TAB
500 mg | Freq: Four times a day (QID) | ORAL | Status: DC
Start: 2021-05-27 — End: 2021-05-28
  Administered 2021-05-27 (×2): via ORAL

## 2021-05-27 MED ORDER — ONDANSETRON HCL 4 MG/2ML IJ SOLN
4 MG/2ML | Freq: Four times a day (QID) | INTRAMUSCULAR | Status: AC | PRN
Start: 2021-05-27 — End: 2021-06-09
  Administered 2021-06-09: 14:00:00 4 mg via INTRAVENOUS

## 2021-05-27 MED ORDER — STERILE WATER FOR INJECTION (MIXTURES ONLY)
1 g | Freq: Three times a day (TID) | INTRAMUSCULAR | Status: DC
Start: 2021-05-27 — End: 2021-06-01
  Administered 2021-05-28 – 2021-06-01 (×13): 2000 mg via INTRAVENOUS

## 2021-05-27 MED ORDER — PANTOPRAZOLE SODIUM 40 MG PO TBEC
40 MG | Freq: Every day | ORAL | Status: AC
Start: 2021-05-27 — End: 2021-06-09
  Administered 2021-05-28 – 2021-06-09 (×12): 40 mg via ORAL

## 2021-05-27 MED ORDER — ONDANSETRON 4 MG PO TBDP
4 MG | Freq: Four times a day (QID) | ORAL | Status: AC | PRN
Start: 2021-05-27 — End: 2021-06-09

## 2021-05-27 MED ORDER — ASPIRIN 81 MG PO CHEW
81 MG | Freq: Two times a day (BID) | ORAL | Status: DC
Start: 2021-05-27 — End: 2021-06-09
  Administered 2021-05-28 – 2021-06-09 (×23): 81 mg via ORAL

## 2021-05-27 MED ORDER — CELECOXIB 100 MG PO CAPS
100 MG | Freq: Two times a day (BID) | ORAL | Status: AC
Start: 2021-05-27 — End: 2021-06-09
  Administered 2021-05-28 – 2021-06-09 (×23): 100 mg via ORAL

## 2021-05-27 MED ORDER — METOPROLOL SUCCINATE ER 25 MG PO TB24
25 MG | Freq: Every day | ORAL | Status: AC
Start: 2021-05-27 — End: 2021-06-08
  Administered 2021-05-28 – 2021-06-06 (×9): 25 mg via ORAL

## 2021-05-27 MED ORDER — TRAMADOL HCL 50 MG PO TABS
50 | Freq: Four times a day (QID) | ORAL | Status: DC | PRN
Start: 2021-05-27 — End: 2021-06-09
  Administered 2021-05-29 – 2021-06-09 (×18): 50 mg via ORAL

## 2021-05-27 MED ORDER — SODIUM CHLORIDE 0.9 % IV SOLN
0.9 % | INTRAVENOUS | Status: AC | PRN
Start: 2021-05-27 — End: 2021-06-09

## 2021-05-27 MED ORDER — BUDESONIDE-FORMOTEROL FUMARATE 160-4.5 MCG/ACT IN AERO
Freq: Two times a day (BID) | RESPIRATORY_TRACT | Status: AC
Start: 2021-05-27 — End: 2021-06-09
  Administered 2021-05-29 – 2021-06-09 (×21): 2 via RESPIRATORY_TRACT

## 2021-05-27 MED ORDER — TIOTROPIUM BROMIDE MONOHYDRATE 2.5 MCG/ACT IN AERS
2.5 MCG/ACT | Freq: Every day | RESPIRATORY_TRACT | Status: AC
Start: 2021-05-27 — End: 2021-06-09
  Administered 2021-05-28 – 2021-06-09 (×12): 2 via RESPIRATORY_TRACT

## 2021-05-27 MED ORDER — ACETAMINOPHEN 500 MG PO TABS
500 MG | Freq: Four times a day (QID) | ORAL | Status: AC
Start: 2021-05-27 — End: 2021-06-09
  Administered 2021-05-28 – 2021-06-09 (×40): 1000 mg via ORAL

## 2021-05-27 MED ORDER — ALBUTEROL SULFATE HFA 108 (90 BASE) MCG/ACT IN AERS
108 (90 Base) MCG/ACT | RESPIRATORY_TRACT | Status: AC | PRN
Start: 2021-05-27 — End: 2021-06-09

## 2021-05-27 MED ORDER — NORMAL SALINE FLUSH 0.9 % IV SOLN
0.9 % | Freq: Two times a day (BID) | INTRAVENOUS | Status: AC
Start: 2021-05-27 — End: 2021-06-09
  Administered 2021-05-28 – 2021-06-05 (×16): 10 mL via INTRAVENOUS
  Administered 2021-06-06: 01:00:00 5 mL via INTRAVENOUS
  Administered 2021-06-06 – 2021-06-09 (×7): 10 mL via INTRAVENOUS

## 2021-05-27 MED ORDER — NORMAL SALINE FLUSH 0.9 % IV SOLN
0.9 % | INTRAVENOUS | Status: AC | PRN
Start: 2021-05-27 — End: 2021-06-09

## 2021-05-27 MED ORDER — SENNA-DOCUSATE SODIUM 8.6-50 MG PO TABS
Freq: Two times a day (BID) | ORAL | Status: AC
Start: 2021-05-27 — End: 2021-06-09
  Administered 2021-05-28 – 2021-06-08 (×19): 1 via ORAL

## 2021-05-27 MED ORDER — ATORVASTATIN CALCIUM 40 MG PO TABS
40 MG | Freq: Every evening | ORAL | Status: AC
Start: 2021-05-27 — End: 2021-06-09
  Administered 2021-05-30 – 2021-06-09 (×11): 40 mg via ORAL

## 2021-05-27 MED ORDER — POLYETHYLENE GLYCOL 3350 17 G PO PACK
17 g | Freq: Every day | ORAL | Status: AC
Start: 2021-05-27 — End: 2021-06-09
  Administered 2021-05-28 – 2021-06-05 (×7): 17 g via ORAL

## 2021-05-27 MED FILL — CELECOXIB 100 MG PO CAPS: 100 MG | ORAL | Qty: 1

## 2021-05-27 MED FILL — METOPROLOL SUCCINATE ER 25 MG PO TB24: 25 MG | ORAL | Qty: 1

## 2021-05-27 MED FILL — PANTOPRAZOLE SODIUM 40 MG PO TBEC: 40 MG | ORAL | Qty: 1

## 2021-05-27 MED FILL — TRAMADOL HCL 50 MG PO TABS: 50 MG | ORAL | Qty: 1

## 2021-05-27 MED FILL — TYLENOL EXTRA STRENGTH 500 MG PO TABS: 500 MG | ORAL | Qty: 2

## 2021-05-27 MED FILL — ONDANSETRON 4 MG PO TBDP: 4 MG | ORAL | Qty: 1

## 2021-05-27 MED FILL — ASPIRIN LOW STRENGTH 81 MG PO CHEW: 81 MG | ORAL | Qty: 1

## 2021-05-27 MED FILL — SENNOSIDES-DOCUSATE SODIUM 8.6-50 MG PO TABS: ORAL | Qty: 1

## 2021-05-27 MED FILL — HEALTHYLAX 17 G PO PACK: 17 g | ORAL | Qty: 1

## 2021-05-27 MED FILL — CEFAZOLIN SODIUM 1 G IJ SOLR: 1 g | INTRAMUSCULAR | Qty: 2000

## 2021-05-27 MED FILL — SODIUM CHLORIDE FLUSH 0.9 % IV SOLN: 0.9 % | INTRAVENOUS | Qty: 40

## 2021-05-27 MED FILL — ONDANSETRON HCL 4 MG/2ML IJ SOLN: 4 MG/2ML | INTRAMUSCULAR | Qty: 2

## 2021-05-27 MED FILL — ATORVASTATIN CALCIUM 40 MG PO TABS: 40 MG | ORAL | Qty: 1

## 2021-05-27 MED FILL — TRAMADOL 50 MG TAB: 50 mg | ORAL | Qty: 1

## 2021-05-27 MED FILL — ACETAMINOPHEN 500 MG TAB: 500 mg | ORAL | Qty: 2

## 2021-05-27 MED FILL — ASPIRIN 81 MG CHEWABLE TAB: 81 mg | ORAL | Qty: 1

## 2021-05-27 MED FILL — CEFAZOLIN 1 GRAM SOLUTION FOR INJECTION: 1 gram | INTRAMUSCULAR | Qty: 2000

## 2021-05-27 MED FILL — METOPROLOL SUCCINATE SR 25 MG 24 HR TAB: 25 mg | ORAL | Qty: 1

## 2021-05-27 MED FILL — ONDANSETRON (PF) 4 MG/2 ML INJECTION: 4 mg/2 mL | INTRAMUSCULAR | Qty: 2

## 2021-05-27 MED FILL — TYLENOL 325 MG TABLET: 325 mg | ORAL | Qty: 2

## 2021-05-27 MED FILL — PANTOPRAZOLE 40 MG TAB, DELAYED RELEASE: 40 mg | ORAL | Qty: 1

## 2021-05-27 MED FILL — ATORVASTATIN 40 MG TAB: 40 mg | ORAL | Qty: 1

## 2021-05-27 MED FILL — CELECOXIB 100 MG CAP: 100 mg | ORAL | Qty: 1

## 2021-05-27 MED FILL — STIMULANT LAXATIVE PLUS 8.6 MG-50 MG TABLET: ORAL | Qty: 1

## 2021-05-27 MED FILL — WATER FOR INJECTION, STERILE INJECTION: INTRAMUSCULAR | Qty: 20

## 2021-05-27 NOTE — Progress Notes (Signed)
Progress Notes by Janeann Forehand at 05/27/21 1507                Author: Janeann Forehand  Service: CASE MANAGEMENT  Author Type: Social Worker       Filed: 05/27/21 1511  Date of Service: 05/27/21 1507  Status: Signed          Editor: Janeann Forehand (Social Worker)               Chart reviewed, patient recc for IRF by PT.        CM met with patient to discuss this recc, she is agreeable to [facility] IRF, CM explained that its possible her insurance may deny and we like to send SNF referrals also.  Patient stated she is not from this area and not familiar with SNF's, CM provided  SNF choice list and requested she review in the event insurance denies IRF, patient agreeable.      Referral sent to [facility] via careport, awaiting possible acceptance, CM requested they start insurance auth if able to accept.

## 2021-05-27 NOTE — Progress Notes (Signed)
Progress Notes by Demetria PoreSharma, Jasminne Mealy, MD at 05/27/21 1249                Author: Demetria PoreSharma, Emmaly Leech, MD  Service: Hospitalist  Author Type: Physician       Filed: 05/27/21 1251  Date of Service: 05/27/21 1249  Status: Signed          Editor: Demetria PoreSharma, Topacio Cella, MD (Physician)                                                     HOSPITALIST PROGRESS NOTE   Demetria PoreKshitij Kennis Buell MD, [facility] Center[facility]PH   Hospital Medicine    [facility]   [place], [place]              Daily Progress Note: 05/27/2021           Subjective:        Patient is alert and oriented x3.      Pain well controlled for now.      No overnight fever/chills, chest pain, shortness of breath noted.      Postop day 1 status post left hip revision periprosthetic fracture with loose stem.         Medications reviewed     Current Facility-Administered Medications          Medication  Dose  Route  Frequency           ?  acetaminophen (TYLENOL) tablet 1,000 mg   1,000 mg  Oral  Q6H     ?  ceFAZolin (ANCEF) 2 g in sterile water (preservative free) 20 mL IV syringe   2 g  IntraVENous  Q8H     ?  celecoxib (CELEBREX) capsule 100 mg   100 mg  Oral  BID     ?  traMADoL (ULTRAM) tablet 50 mg   50 mg  Oral  Q6H PRN     ?  aspirin chewable tablet 81 mg   81 mg  Oral  BID           ?  albuterol (PROVENTIL HFA, VENTOLIN HFA, PROAIR HFA) inhaler 2 Puff   2 Puff  Inhalation  Q4H PRN           ?  budesonide-formoteroL (SYMBICORT) 160-4.5 mcg/actuation HFA inhaler 2 Puff   2 Puff  Inhalation  BID RT          And           ?  tiotropium bromide (SPIRIVA RESPIMAT) 2.5 mcg /actuation   2 Puff  Inhalation  DAILY     ?  senna-docusate (PERICOLACE) 8.6-50 mg per tablet 1 Tablet   1 Tablet  Oral  BID     ?  pantoprazole (PROTONIX) tablet 40 mg   40 mg  Oral  DAILY     ?  metoprolol succinate (TOPROL-XL) XL tablet 25 mg   25 mg  Oral  DAILY     ?  atorvastatin (LIPITOR) tablet 40 mg   40 mg  Oral  DAILY     ?  sodium chloride (NS) flush 5-40 mL   5-40 mL  IntraVENous  Q8H      ?  sodium chloride (NS) flush 5-40 mL   5-40 mL  IntraVENous  PRN     ?  polyethylene glycol (MIRALAX) packet  17 g   17 g  Oral  DAILY PRN     ?  ondansetron (ZOFRAN ODT) tablet 4 mg   4 mg  Oral  Q8H PRN          Or           ?  ondansetron (ZOFRAN) injection 4 mg   4 mg  IntraVENous  Q6H PRN             Review of Systems:     A comprehensive review of systems was negative except for that written in the HPI.        Objective:     Physical Exam:       Visit Vitals      BP  123/71     Pulse  (!) 101     Temp  98.3 F (36.8 C)     Resp  22     Ht  5\' 3"  (1.6 m)     Wt  47.6 kg (105 lb)     SpO2  95%        BMI  18.60 kg/m      O2 Flow Rate (L/min): 1 l/min O2 Device: Nasal cannula   Patient Vitals for the past 8 hrs:            Temp  Pulse  Resp  BP  SpO2            05/27/21 0806  --  (!) 101  --  123/71  --            05/27/21 0801  98.3 F (36.8 C)  (!) 101  22  123/71  95 %              Temp (24hrs), Avg:97.9 F (36.6 C), Min:97.4 F (36.3 C), Max:98.3 F (36.8 C)     No intake/output data recorded.   05/03 1901 - 05/05 0700   In: 2540 [P.O.:540; I.V.:2000]   Out: 500          General:   Alert, cooperative, no distress, appears stated age.        Lungs:    Clear to auscultation bilaterally.        Chest wall:   No tenderness or deformity.        Heart:   Regular rate and rhythm, S1, S2 normal, no murmur, click, rub or gallop.        Abdomen:    Soft, non-tender. Bowel sounds normal. No masses,  No organomegaly.     Extremities:  Extremities normal, atraumatic, no cyanosis or edema.     Pulses:  2+ and symmetric all extremities.     Skin:  Skin color, texture, turgor normal. No rashes or lesions        Neurologic:  No gross sensory or motor deficits          Data Review:          Recent Days:     Recent Labs            05/25/21   0506  05/24/21   1844     WBC  12.1*  12.5*     HGB  12.7  12.6     HCT  39.6  39.0         PLT  280  282          Recent Labs  05/25/21   0506  05/24/21   1851   05/24/21   1749     NA  134*   --   135*     K  4.1   --   5.0     CL  104   --   105     CO2  27   --   23     GLU  111*   --   124*     BUN  16   --   13     CREA  0.95   --   0.87     CA  9.1   --   9.5          INR   --   1.0   --         No results for input(s): PH, PCO2, PO2, HCO3, FIO2 in the last 72 hours.      24 Hour Results:     Recent Results (from the past 24 hour(s))     TYPE & SCREEN          Collection Time: 05/26/21  6:16 PM         Result  Value  Ref Range            Crossmatch Expiration  05/29/2021,2359         ABO/Rh(D)  [place] Positive              Antibody screen  Negative                        Assessment/Plan:        Left femur acute fracture   Pain control. IV morphine PRN   Consult orthopedics    NPO after midnight   Hold anti-coagulant.     Status post femur periprosthetic repair.   Weightbearing as tolerated and will continue to monitor as patient likely will require SNF versus IRF.   Case Management is made aware.       Osteopenia   Check vitamin D level. Calcium level within normal limits    Replace as needed.        Recurrent fall   Already has home PT/OT. May need acute rehab after discharge this time. PT/OT evaluation.    Orthostatic vital signs.       COPD   Not in respiratory distress, however diffused crackles on exam.   Chest X-ray normal.   Continue home medications.         Essential hypertension    PO metoprolol        HLP    PO lipitor.          Care Plan discussed with: Patient/Family and Nurse      Total time spent with patient: 25 minutes.    With greater than 50% spent in coordination of care and counseling.      Demetria Pore, MD

## 2021-05-27 NOTE — Progress Notes (Signed)
Progress  Notes by Monico Blitz, OT at 05/27/21 1611                Author: Monico Blitz, OT  Service: --  Author Type: Occupational Therapist       Filed: 05/27/21 1616  Date of Service: 05/27/21 1611  Status: Signed          Editor: Monico Blitz, OT (Occupational Therapist)               OCCUPATIONAL THERAPY EVALUATION   Patient: [name] (76 y.o. female)   Date: 05/27/2021   Primary Diagnosis: Femur fracture ([place]) [S72.90XA]   Procedure(s) (LRB):   LEFT HIP REVISION POSTERIOR APPROACH WITH ALLOGRAFT (Left) 1 Day Post-Op    Precautions: falls, WBAT LLE, hip precautions      In place during session: Pure WICK        ASSESSMENT   Pt is a 76 y.o. female [facility] a/p fall, admitted 05/24/21 and currently being treated for s/p L hip revision, posterior approach. Pt received semi-supine in bed upon arrival, AXO x4, and agreeable  to OT/PT evaluation.       Based on current observations, pt presents with deficits in generalized strength/AROM, bed mobility, static/dynamic sitting balance, static/dynamic standing balance (see PT note for gait details), functional activity tolerance and pain currently impacting  overall performance of ADLs and functional transfers/mobility (see below for objective details and assist levels). Pt min A x2 for supine:sit. Max x2 for ZOX:WRUEA 4x with RW. Pt unable to side step or forward due to c/o pain. Mod A x2 for VWU:[id number].  CG for scooting back EOB. All needs within reach. Pt Ind with feeding and grooming long sitting in bed.          Overall, pt tolerates session fair with rest breaks.       Pt would benefit from continued skilled OT services to address current impairments and improve IND and safety with self cares and functional transfers/mobility. Current OT d/c recommendation Inpatient Rehabilitation Facility   once medically appropriate.      Current Level of Function Impacting Discharge:       Other factors to consider for discharge: family/social support, DME,  time since onset, severity of deficits, functional baseline       Patient will benefit from skilled therapy intervention to address the above noted impairments.            PLAN :   Recommendations and Planned Interventions: self care training, functional mobility training,  therapeutic exercise, and balance training      Recommend with staff: Out of bed to chair for meals and Encourage HEP in prep for  ADLs/mobility      Recommend next session: Toileting, UB dressing, LB dressing , Seated grooming, and Standing grooming      Frequency/Duration: Patient will be followed by occupational therapy:  3-5x/week to address goals.      Recommendation for discharge: (in order for the patient to meet his/her long term goals)   [place]       This discharge recommendation:   Has been made in collaboration with the attending provider and/or case management      IF patient discharges home will need the following DME: patient owns DME required for discharge             SUBJECTIVE:     Patient stated I want to walk.        OBJECTIVE DATA SUMMARY:  HISTORY:      Past Medical History:        Diagnosis  Date         ?  Chronic obstructive pulmonary disease (HCC)       ?  Hypercholesteremia       ?  Insomnia           ?  Sleep disorder          History reviewed. No pertinent surgical history.      Per patient:    Home Situation   Home Environment: Private residence   # Steps to Enter: 5   Rails to Enter: Yes   Hand Rails : Bilateral   One/Two Story Residence: Two story, live on 1st floor   Living Alone: Yes   Support Systems: Friend/Neighbor   Patient Expects to be Discharged to:: Home with home health   Current DME Used/Available at Home: Cane, straight, Wheelchair, Environmental consultant, rolling   Tub or Shower Type: Tub/Shower combination      Hand dominance: Right      EXAMINATION OF PERFORMANCE DEFICITS:   Cognitive/Behavioral Status:   Neurologic State: Alert   Orientation Level: Oriented X4   Cognition: Follows  commands                        Hearing:   Auditory   Auditory Impairment: None      Vision/Perceptual:                                              Range of Motion:   AROM: Generally decreased, functional                                 Strength:   Strength: Generally decreased, functional                      Coordination:       Fine Motor Skills-Upper: Left Intact;Right Intact     Gross Motor Skills-Upper: Left Intact;Right Intact      Tone & Sensation:       Sensation: Intact                              Balance:   Sitting: Impaired   Sitting - Static: Fair (occasional)   Sitting - Dynamic: Fair (occasional)   Standing: Impaired   Standing - Static: Fair;Constant support   Standing - Dynamic : Fair;Constant support      Functional Mobility and Transfers for ADLs:   Bed Mobility:   Rolling: Minimum assistance;Assist x2   Supine to Sit: Moderate assistance;Assist x2   Sit to Supine: Moderate assistance;Assist x2   Scooting: Minimum assistance;Assist x2      Transfers:   Sit to Stand: Maximum assistance;Assist x2   Stand to Sit: Maximum assistance;Assist x2         ADL Intervention and task modifications:  Functional Measure:      KB Home	[place] AM-PACTM "6 Clicks"                                                        Daily Activity Inpatient Short Form         How much help from another person does the patient currently need...  Total;  A Lot  A Little  None           1.  Putting on and taking off regular lower body clothing?  []   1  [x]   2  []   3  []   4     2.  Bathing (including washing, rinsing, drying)?  []   1  [x]   2  []   3  []   4     3.  Toileting, which includes using toilet, bedpan or urinal?  []  1  [x]   2  []   3  []   4     4.  Putting on and taking off regular upper body clothing?  []   1  []   2  [x]   3  []   4     5.  Taking care of personal grooming such as brushing teeth?  []   1  []   2  []   3  [x]   4           6.  Eating meals?  []   1  []   2   []   3  [x]   4      2007, [employer], under license to [employer]. All rights reserved       Score: 17/24       Interpretation of Tool:  Represents clinically-significant functional categories (i.e. Activities of daily living).            Percentage of Impairment  CH      0%     CI      1-19%  CJ      20-39%  CK      40-59%  CL      60-79%  CM      80-99%  CN       100%              AMPAC   Score 6-24  24  23   20-22  15-19  10-14  7-9  6          Occupational Therapy Evaluation Charge Determination         History  Examination  Decision-Making         LOW Complexity : Brief history review   LOW Complexity : 1-3 performance deficits relating to physical, cognitive , or psychosocial skils that result in activity limitations and / or participation  restrictions   MEDIUM Complexity : Patient may present with comorbidities that affect occupational performnce. Miniml to moderate modification of tasks or assistance  (eg, physical or verbal ) with assesment(s) is necessary to enable patient to complete evaluation          Based on the above components, the patient evaluation is determined to be of the following complexity level: LOW    Pain Rating:   7/10 LLE      Activity Tolerance:    Fair      After treatment patient left  in no apparent distress:     Supine in bed, Call bell within reach, and Side rails x 3, bed locked and in lowest position        COMMUNICATION/EDUCATION:     The patients plan of care was discussed with: Physical therapist and Registered nurse.       The supervising occupational therapist and treating occupational therapist assistant have met to review this patients progress and plan of care.      Patient/family have participated as able in goal setting and plan of care. and Patient/family agree to work toward stated goals and plan of care.      This patients plan of care is appropriate for delegation to OTA.       PT/OT sessions occurred together for increased safety of pt and  clinician.       Thank you for this referral.   Monico Blitz, OT   Time Calculation: 28 mins    Problem: Self Care Deficits Care Plan (Adult)   Goal: *Acute Goals and Plan of Care (Insert Text)   Description: FUNCTIONAL STATUS PRIOR TO ADMISSION: Pt states she was ind with ADLs with no AD PTA.      HOME SUPPORT: Lives alone     Occupational Therapy Goals  Initiated 05/27/2021    Pt stated goal: To return to PLOF  Pt will be Mod I sup <> sit in prep for EOB ADLs  Pt will be Mod I grooming standing sink side LRAD  Pt will be Mod I UB dressing sitting EOB/long  sit   Pt will be Mod I LE dressing sitting EOB/long sit  Pt will be Mod I sit <> stand in prep for toileting LRAD  Pt will be Mod I toileting/toilet transfer/cloth mgmt LRAD

## 2021-05-27 NOTE — Progress Notes (Signed)
Progress Notes by Moody Bruins, PA-C at 05/27/21 6812                Author: Moody Bruins, PA-C  Service: Physician Assistant  Author Type: Physician Assistant       Filed: 05/27/21 0814  Date of Service: 05/27/21 0809  Status: Attested           Editor: Lenny Pastel (Physician Assistant)  Cosigner: Golden Pop, MD at 05/27/21 1152          Attestation signed by Golden Pop, MD at 05/27/21 1152          She is postop day 1 from left hip revision hemiarthroplasty with [place] revision stem.  On late a.m. rounds the patient's pain was very well controlled.  She  has no left knee pain.  She is confused and alert only to herself.  She stated it was March 1922, and she did not know the city or hospital she was in.                                   Orthopedic progress note      Date:05/27/2021       Room:439/01   Patient Name:[name]     Date of Birth:1945/10/31     Age:76 y.o.            Subjective       76 year old female status post removal of hardware/ORIF left hip hemiarthroplasty.  Postop day #1.   Patient is doing well.  She is complaining of moderate pain of the left hip with radiation to her left knee.  She does feel her pain medication is helping some.   No other complaints at this time.   Objective                      Vitals Last 24 Hours:   TEMPERATURE:  Temp  Avg: 97.9 F (36.6 C)  Min: 97.4 F (36.3 C)  Max: 98.3 F (36.8 C)   RESPIRATIONS RANGE: Resp  Avg: 16.8  Min: 13  Max: 20   PULSE OXIMETRY RANGE: SpO2  Avg: 94 %  Min: 89 %  Max: 98 %   PULSE RANGE: Pulse  Avg: 91.6  Min: 72  Max: 104   BLOOD PRESSURE RANGE: Systolic (75TZG), YFV:494 , Min:96 , WHQ:759    ; Diastolic (16BWG), YKZ:99, Min:58, Max:80        Current Facility-Administered Medications          Medication  Dose  Route  Frequency           ?  ceFAZolin (ANCEF) 2 g in sterile water (preservative free) 20 mL IV syringe   2 g  IntraVENous  Q8H     ?  acetaminophen (TYLENOL) tablet 650 mg   650 mg  Oral  Q6H     ?   celecoxib (CELEBREX) capsule 100 mg   100 mg  Oral  BID     ?  traMADoL (ULTRAM) tablet 50 mg   50 mg  Oral  Q6H PRN     ?  aspirin chewable tablet 81 mg   81 mg  Oral  BID     ?  albuterol (PROVENTIL HFA, VENTOLIN HFA, PROAIR HFA) inhaler 2 Puff   2 Puff  Inhalation  Q4H PRN     ?  budesonide-formoteroL (SYMBICORT)  160-4.5 mcg/actuation HFA inhaler 2 Puff   2 Puff  Inhalation  BID RT          And           ?  tiotropium bromide (SPIRIVA RESPIMAT) 2.5 mcg /actuation   2 Puff  Inhalation  DAILY     ?  senna-docusate (PERICOLACE) 8.6-50 mg per tablet 1 Tablet   1 Tablet  Oral  BID     ?  pantoprazole (PROTONIX) tablet 40 mg   40 mg  Oral  DAILY     ?  metoprolol succinate (TOPROL-XL) XL tablet 25 mg   25 mg  Oral  DAILY     ?  atorvastatin (LIPITOR) tablet 40 mg   40 mg  Oral  DAILY     ?  sodium chloride (NS) flush 5-40 mL   5-40 mL  IntraVENous  Q8H     ?  sodium chloride (NS) flush 5-40 mL   5-40 mL  IntraVENous  PRN     ?  polyethylene glycol (MIRALAX) packet 17 g   17 g  Oral  DAILY PRN     ?  ondansetron (ZOFRAN ODT) tablet 4 mg   4 mg  Oral  Q8H PRN          Or           ?  ondansetron (ZOFRAN) injection 4 mg   4 mg  IntraVENous  Q6H PRN         Review of Systems    Constitutional: Negative for malaise/fatigue.    Respiratory: Negative for cough, shortness of breath and wheezing.     Cardiovascular: Negative for chest pain and palpitations.    Gastrointestinal: Negative for abdominal pain, heartburn, nausea and vomiting.    Neurological: Negative for headaches.    Musculoskeletal: Denies any numbness/tingling of operative extremity       I/O (24Hr):      Intake/Output Summary (Last 24 hours) at 05/27/2021 0809   Last data filed at 05/27/2021 0431     Gross per 24 hour        Intake  2540 ml        Output  500 ml        Net  2040 ml        Objective:   Vital signs: (most recent): Blood pressure 120/68, pulse 93, temperature 97.9 F (36.6 C), resp. rate 18, height 5' 3" (1.6 m), weight 47.6 kg (105 lb), SpO2 97 %.         Labs/Imaging/Diagnostics       Labs:   CBC:     Recent Labs            05/25/21   0506  05/24/21   1844     WBC  12.1*  12.5*     RBC  4.37  4.32     HGB  12.7  12.6     HCT  39.6  39.0     MCV  90.6  90.3     RDW  15.2*  15.2*         PLT  280  282        CHEMISTRIES:     Recent Labs            05/25/21   0506  05/24/21   1749     NA  134*  135*     K  4.1  5.0     CL  104  105     CO2  27  23     BUN  16  13     CREA  0.95  0.87         CA  9.1  9.5     PT/INR:     Recent Labs           05/24/21   1851        INR  1.0        APTT:     Recent Labs           05/24/21   1851        APTT  27.4        LIVER PROFILE:No results for input(s): AST, ALT in the last 72 hours.      No lab exists for component: Adrian Black[place], ALKPHOS     Lab Results         Component  Value  Date/Time            ALT (SGPT)  16  01/28/2021 08:50 AM       AST (SGOT)  22  01/28/2021 08:50 AM       Alk. phosphatase  102  01/28/2021 08:50 AM            Bilirubin, total  0.6  01/28/2021 08:50 AM           Physical Exam:   Left hip: Aquacel Ag dressing clean dry and intact.  No swelling seen the left thigh.  Mild tenderness palpation throughout the left thigh.  No tenderness palpation throughout the left knee skin warm  and dry throughout.  Sensation intact throughout.  No calf pain to palpation.  DP/PT pulse are palpable.  Cap refill is 2 seconds.  EHL/DF/PF 3 out of 5.  Limited secondary to pain.  Negative Homans.  Left lower extremity neurovascularly intact.      Assessment//Plan                        Patient Active Problem List           Diagnosis  Date Noted         ?  Femur fracture ([place])  05/24/2021     ?  Closed fracture of neck of left femur ([place])  01/26/2021     ?  Hip fracture ([place])  01/25/2021     ?  Sepsis ([place])  01/02/2021     ?  Cardiac syncope  11/27/2020         ?  Ambulatory dysfunction  11/27/2020        Status post removal hardware/revision left hip hemiarthroplasty.  Postop day #1.   We will start therapy today.   Weightbearing as tolerated left lower extremity.   Continue with aspirin for DVT prophylaxis.   Spirometer and exercises encouraged.      Electronically signed by Moody Bruins, PA-C on 05/27/2021 at 8:09 AM

## 2021-05-27 NOTE — Progress Notes (Signed)
Progress  Notes by Sanjuana Kava at 05/27/21 1110                Author: Sanjuana Kava  Service: Physical Therapy  Author Type: Physical Therapist       Filed: 05/27/21 1353  Date of Service: 05/27/21 1110  Status: Signed          Editor: Sanjuana Kava (Physical Therapist)                  Problem: Mobility Impaired (Adult and Pediatric)   Goal: *Acute Goals and Plan of Care (Insert Text)   Description: FUNCTIONAL STATUS PRIOR TO ADMISSION: Patient was independent and active without use of DME.    HOME SUPPORT PRIOR TO  ADMISSION: The patient lived alone with no local support.    Physical Therapy Goals  Initiated 05/27/2021  Patient/family stated goal: to get stronger and walk   1.  Patient will move from supine to sit and sit to supine  in bed with  supervision/set-up within 7 day(s).    2.  Patient will transfer from bed to chair and chair to bed with supervision/set-up using the least restrictive device within 7 day(s).  3.  Patient will perform sit to stand with supervision/set-up within  7 day(s).  4.  Patient will ambulate with supervision/set-up for 25 feet with the least restrictive device within 7 day(s).   5.  Patient will ascend/descend 4 stairs with B handrail(s) with supervision/set-up within 7 day(s).  6.  Patient  will participate in lower extremity therapeutic exercise/activities with supervision/set-up for 10 minutes within 7 day(s).          Outcome: Not Met   PHYSICAL THERAPY EVALUATION   Patient: [name] (76 y.o. female)   Date: 05/27/2021   Primary Diagnosis: Femur fracture ([place]) [S72.90XA]   Procedure(s) (LRB):   LEFT HIP REVISION POSTERIOR APPROACH WITH ALLOGRAFT (Left) 1 Day Post-Op    Precautions: fall, WBAT LLE     In place during session: Nasal Cannula 1L, External Catheter, and EKG/telemetry         ASSESSMENT   Pt is a 76 y.o. female admitted on 05/24/2021 for GLF with L hip periprosthetic fx; pt currently being treated for s/p removal of hardware/ORIF L hip hemiarthroplasty  with posterior hip precautions per  MD note . Pt semi-supine upon PT arrival, agreeable to evaluation. Pt A&O x 2.  Pt reports 1 fall in the past 3 months.      Based on the objective data described, the patient presents with generalized weakness, impaired functional mobility, impaired amb, and impaired balance (See below for objective details and assist levels). Pt reports slight dizziness when first coming  to EOB but this resolved within one minute.  Pt requiring additional time to get to EOB due to increased pain when dropping her legs over the side of the bed. Pt attempted sit to stand x 4 attempts.  On the first two attempts, pt unable to bear weight  through the LLE, but on second two attempts pt was able to put a small amount of weight through the LLE but still c/o of 10/10 pain and also demonstrated a posterior lean requiring VC's to correct.  Once sitting down, pt stating she felt nauseous but  after some cold water and a rest break, she was feeling better. Pt attempted to take a step on the last attempt and she could take a step forward with the L foot, but unable to take a  step with the R foot.  Pt assisted back to bed and reviewed posterior  hip precautions with pt and pt's verbalizes understanding. Overall pt tolerated session fair today with 10/10 pain in LLE. Pt will benefit from  continued skilled PT to address above deficits and return to PLOF. Current PT DC recommendation Inpatient Rehabilitation Facility  once medically appropriate.      Current Level of Function Impacting Discharge (mobility/balance): impaired mobility, level of assist                PLAN :   Recommendations and Planned Interventions: bed mobility training, transfer training, gait training, therapeutic exercises, patient and family training/education,  and therapeutic activities        Recommend for staff: Frequent repositioning to prevent skin breakdown and Use of bed/chair alarm for safety      Frequency/Duration: Patient will  be followed by physical therapy:  3-5x/week to address goals.      Recommendation for discharge: (in order for the patient to meet his/her long term goals)   [place]       This discharge recommendation:   Has been made in collaboration with the attending provider and/or case management      IF patient discharges home will need the following DME: none                SUBJECTIVE:     Patient stated I feel fine if I don't move.        OBJECTIVE DATA SUMMARY:     HISTORY:       Past Medical History:        Diagnosis  Date         ?  Chronic obstructive pulmonary disease (HCC)       ?  Hypercholesteremia       ?  Insomnia           ?  Sleep disorder       History reviewed. No pertinent surgical history.      Home Situation   Home Environment: Private residence   # Steps to Enter: 5   Rails to Enter: Yes   Hand Rails : Bilateral   One/Two Story Residence: Two story, live on 1st floor   Living Alone: Yes   Support Systems: Friend/Neighbor   Patient Expects to be Discharged to:: Home with home health   Current DME Used/Available at Home: Kasandra Knudsen, straight, Wheelchair, Environmental consultant, rolling   Tub or Shower Type: Tub/Shower combination      EXAMINATION/PRESENTATION/DECISION MAKING:    Critical Behavior:   Neurologic State: Alert   Orientation Level: Oriented to person, Oriented to situation, Oriented to time   Cognition: Follows commands, Appropriate decision making       Hearing:   Auditory   Auditory Impairment: None   Range Of Motion:   AROM: Generally decreased, functional                               Strength:     Strength: Generally decreased, functional                 Right  Left     HIP FLEXION  3+/5       HIP EXTENSION         HIP ABDUCTION         HIP ADDUCTION         KNEE FLEXION  3+/5  3-/5     KNEE EXTENSION  3+/5  3-/5     ANKLE PF   3+/5  3-/5         ANKLE DF  3+/5  3-/5        0/5       No palpable muscle contraction   1/5       Palpable muscle contraction, no joint movement   2-/5       Less than full range of motion in gravity eliminated position   2/5       Able to complete full range of motion in gravity eliminated position   2+/5     Able to initiate movement against gravity   3-/5      More than half but not full range of motion against gravity   3/5       Able to complete full range of motion against gravity   3+/5     Completes full range of motion against gravity with minimal resistance   4-/5      Completes full range of motion against gravity with minimal-moderate resistance   4/5       Completes full range of motion against gravity with moderate resistance   4+/5     Completes full range of motion against gravity with moderate-maximum resistance   5/5       Completes full range of motion against gravity with maximum resistance                    Tone & Sensation:                        Sensation: Intact                        Functional Mobility:   Bed Mobility:   Rolling: Minimum assistance;Assist x2   Supine to Sit: Moderate assistance;Assist x2   Sit to Supine: Moderate assistance;Assist x2   Scooting: Minimum assistance;Assist x2   Transfers:   Sit to Stand: Maximum assistance;Assist x2   Stand to Sit: Maximum assistance;Assist x2                             Balance:    Sitting: Impaired   Sitting - Static: Good (unsupported)   Sitting - Dynamic: Fair (occasional)   Standing: Impaired   Standing - Static: Fair;Constant support   Standing - Dynamic : Poor;Constant support   Ambulation/Gait Training:                                                                              Functional Measure:   [employer]? 6 Clicks          Basic Mobility Inpatient Short Form         How much difficulty does the patient currently have...  Unable  A Lot  A Little  None     1.  Turning over in bed (including adjusting bedclothes, sheets and blankets)?    []   1    []   2    [x]   3    []   4     2.  Sitting down on and standing up from a chair with arms ( e.g., wheelchair, bedside commode,  etc.)    []   1    [x]   2    []   3    []   4     3.  Moving from lying on back to sitting on the side of the bed?    []   1    [x]   2    []   3    []   4                              How much help from another person does the patient currently need...  Total  A Lot  A Little  None     4.  Moving to and from a bed to a chair (including a wheelchair)?    []   1    []   2    [x]   3    []   4     5.  Need to walk in hospital room?    []   1    [x]   2    []   3    []   4           6.  Climbing 3-5 steps with a railing?    []   1    [x]   2    []   3    []   4      2007, [employer], under license to [employer]. All rights reserved           Score:   Initial: 14  Most Recent: X (Date: 05/27/21  )     Interpretation of Tool:  Represents activities that are increasingly more difficult (i.e. Bed mobility, Transfers, Gait).            Score  24  23  22-20  19-15  14-10  9-7  6              Modifier  CH  CI  CJ  CK  CL  CM  CN               Physical Therapy Evaluation Charge Determination          History  Examination  Presentation  Decision-Making          MEDIUM  Complexity : 1-2 comorbidities / personal factors will impact the outcome/ POC   MEDIUM Complexity : 3 Standardized tests and measures addressing body structure, function, activity limitation and / or participation in recreation   LOW Complexity : Stable, uncomplicated   Other outcome measures ampac 6  low         Based on the above components, the patient evaluation is determined to be of the following complexity level: LOW       Pain Rating:   0-10/10 LLE      Activity Tolerance:    Fair and requires frequent rest breaks      After treatment patient left in no apparent distress:    Bed locked and in lowest position Supine in bed, Call bell within reach, and Side rails x 3 and nursing updated.           COMMUNICATION/EDUCATION:     The patients plan of care was discussed with: Occupational therapist and Registered nurse.  Patient/family agree to work  toward stated goals and plan of care.      PT/OT sessions occurred together for increased safety of pt and clinician.        Thank you for this referral.   Sanjuana Kava, PT    Time Calculation: 32 mins

## 2021-05-27 NOTE — Progress Notes (Signed)
Progress Notes by Deretha Emory, LPN at 16/01/09 1150                Author: Deretha Emory, LPN  Service: NURSING  Author Type: Licensed Nurse       Filed: 05/27/21 1150  Date of Service: 05/27/21 1150  Status: Signed          Editor: Deretha Emory, LPN (Licensed Nurse)                  Problem: Pressure Injury - Risk of   Goal: *Prevention of pressure injury   Description: Document Braden Scale and appropriate interventions in the flowsheet.   Outcome: Progressing Towards Goal   Note: Pressure Injury Interventions:   Sensory Interventions: Avoid rigorous massage over bony prominences      Moisture Interventions: Internal/External urinary devices, Check for incontinence Q2 hours and as needed, Minimize layers      Activity Interventions: PT/OT evaluation      Mobility Interventions: PT/OT evaluation      Nutrition Interventions: Offer support with meals,snacks and hydration      Friction and Shear Interventions: Minimize layers

## 2021-05-28 LAB — CULTURE, URINE: Culture: NO GROWTH

## 2021-05-28 MED ORDER — MELATONIN 5 MG TABLET
5 mg | Freq: Every evening | ORAL | Status: DC | PRN
Start: 2021-05-28 — End: 2021-05-28
  Administered 2021-05-28: 03:00:00 via ORAL

## 2021-05-28 MED ORDER — CELECOXIB 100 MG PO CAPS
100 MG | ORAL | Status: AC
Start: 2021-05-28 — End: 2021-05-28

## 2021-05-28 MED ORDER — PANTOPRAZOLE SODIUM 40 MG PO TBEC
40 MG | ORAL | Status: AC
Start: 2021-05-28 — End: 2021-05-28

## 2021-05-28 MED ORDER — ACETAMINOPHEN 500 MG PO TABS
500 MG | ORAL | Status: AC
Start: 2021-05-28 — End: 2021-05-28

## 2021-05-28 MED ORDER — BUDESONIDE-FORMOTEROL FUMARATE 160-4.5 MCG/ACT IN AERO
RESPIRATORY_TRACT | Status: AC
Start: 2021-05-28 — End: 2021-05-28
  Administered 2021-05-28: 12:00:00

## 2021-05-28 MED ORDER — POLYETHYLENE GLYCOL 3350 17 G PO PACK
17 g | ORAL | Status: AC
Start: 2021-05-28 — End: 2021-05-28

## 2021-05-28 MED ORDER — TRAMADOL HCL 50 MG PO TABS
50 MG | ORAL | Status: AC
Start: 2021-05-28 — End: 2021-05-28
  Administered 2021-05-28: 16:00:00 50 via ORAL

## 2021-05-28 MED ORDER — SENNA-DOCUSATE SODIUM 8.6-50 MG PO TABS
ORAL | Status: AC
Start: 2021-05-28 — End: 2021-05-28

## 2021-05-28 MED ORDER — ASPIRIN 81 MG PO CHEW
81 MG | ORAL | Status: AC
Start: 2021-05-28 — End: 2021-05-28

## 2021-05-28 MED ORDER — CEFAZOLIN SODIUM 1 G IJ SOLR
1 g | INTRAMUSCULAR | Status: AC
Start: 2021-05-28 — End: 2021-05-29

## 2021-05-28 MED ORDER — CEFAZOLIN SODIUM 1 G IJ SOLR
1 g | INTRAMUSCULAR | Status: AC
Start: 2021-05-28 — End: 2021-05-28

## 2021-05-28 MED ORDER — METOPROLOL TARTRATE 25 MG PO TABS
25 MG | ORAL | Status: AC
Start: 2021-05-28 — End: 2021-05-28

## 2021-05-28 MED ORDER — MELATONIN 5 MG PO TABS
5 MG | Freq: Every evening | ORAL | Status: AC | PRN
Start: 2021-05-28 — End: 2021-06-09
  Administered 2021-05-29 – 2021-06-09 (×10): 5 mg via ORAL

## 2021-05-28 MED ORDER — MELATONIN 5 MG PO TABS
5 MG | ORAL | Status: AC
Start: 2021-05-28 — End: 2021-05-28

## 2021-05-28 MED FILL — CEFAZOLIN SODIUM 1 G IJ SOLR: 1 g | INTRAMUSCULAR | Qty: 2000

## 2021-05-28 MED FILL — ACETAMINOPHEN EXTRA STRENGTH 500 MG PO TABS: 500 MG | ORAL | Qty: 1

## 2021-05-28 MED FILL — PANTOPRAZOLE SODIUM 40 MG PO TBEC: 40 MG | ORAL | Qty: 1

## 2021-05-28 MED FILL — ASPIRIN LOW STRENGTH 81 MG PO CHEW: 81 MG | ORAL | Qty: 1

## 2021-05-28 MED FILL — CELECOXIB 100 MG PO CAPS: 100 MG | ORAL | Qty: 1

## 2021-05-28 MED FILL — SYMBICORT 160-4.5 MCG/ACT IN AERO: RESPIRATORY_TRACT | Qty: 6

## 2021-05-28 MED FILL — MELATONIN 5 MG PO TABS: 5 MG | ORAL | Qty: 1

## 2021-05-28 MED FILL — METOPROLOL TARTRATE 25 MG PO TABS: 25 MG | ORAL | Qty: 1

## 2021-05-28 MED FILL — PEG 3350 17 G PO PACK: 17 g | ORAL | Qty: 1

## 2021-05-28 MED FILL — ACETAMINOPHEN EXTRA STRENGTH 500 MG PO TABS: 500 MG | ORAL | Qty: 2

## 2021-05-28 MED FILL — STIMULANT LAXATIVE 8.6-50 MG PO TABS: ORAL | Qty: 1

## 2021-05-28 MED FILL — TRAMADOL HCL 50 MG PO TABS: 50 MG | ORAL | Qty: 1

## 2021-05-28 MED FILL — SPIRIVA RESPIMAT 2.5 MCG/ACT IN AERS: 2.5 MCG/ACT | RESPIRATORY_TRACT | Qty: 4

## 2021-05-28 MED FILL — SYMBICORT 160 MCG-4.5 MCG/ACTUATION HFA AEROSOL INHALER: RESPIRATORY_TRACT | Qty: 12

## 2021-05-28 NOTE — Care Coordination-Inpatient (Signed)
Chart reviewed, DCP at this time is for patient to d/c to [facility], they are looking at her at this time.  Patient will require insurance auth prior to d/c, prior to auth being started PT/OT will have to have seen in last 24/48 hours.    CM discussed with patient probability of insurance denial to IRF, patient currently reviewing SNF choice list, CM will need to obtain backup SNF choice.    CM continues to follow and monitor for needs.

## 2021-05-28 NOTE — Progress Notes (Signed)
Orthopedic Progress Note    Date:05/28/2021       Room:439/01  Patient Name:[name]     Date of Birth:Dec 18, 1945     Age:76 y.o.    SUBJECTIVE    She did well overnight.  Pain is well controlled.  She is currently sitting in the bed with the blinds open.  She did not sleep very much overnight.  She denies any calf pain.  She was able to get up with occupational and physical therapy yesterday.    VITALS         Temp:  Temp  Avg: 97.9 F (36.6 C)  Min: 97.3 F (36.3 C)  Max: 98.2 F (36.8 C)  HR: Pulse  Avg: 90.5  Min: 77  Max: 102  BP: Systolic (24hrs), Avg:103 , Min:95 , Max:111   ; Diastolic (24hrs), Avg:60, Min:54, Max:62    RR: Resp  Avg: 18  Min: 18  Max: 18  SaO2: SpO2  Avg: 95 %  Min: 93 %  Max: 98 %  LABS    Not applicable    Physical Exam   AAOx  Non-labored Respirations  Abdomen Non-tender    Left lower extremity: Dressing is in place with no drainage.  She is neurovascular intact throughout the left lower extremity.  She is no longer tender about the lateral aspect of the knee.  No calf tenderness.    IMAGING   Postoperative radiographs demonstrate well-positioned revision hemiarthroplasty.  No evidence of fracture.    ASSESSMENT/PLAN           76 year old female postop day 3 from left hip hemiarthroplasty revision due to periprosthetic fracture.  She is doing exceptionally well.  Continue with dementia precautions with blinds and lights on during the day.  She will likely be discharged to either a SNF or inpatient rehab based off of therapy and insurance.  She is cleared to be discharged from a orthopedic standpoint.      Electronically signed by Helene Kelp, MD on 05/28/2021 at 10:15 AM

## 2021-05-28 NOTE — Plan of Care (Signed)
Problem: Occupational Therapy - Adult  Goal: By Discharge: Performs self-care activities at highest level of function for planned discharge setting.  See evaluation for individualized goals.  Description: Description: FUNCTIONAL STATUS PRIOR TO ADMISSION: Pt states she was ind with ADLs with no AD PTA.     HOME SUPPORT: Lives alone    Occupational Therapy Goals  Initiated 05/27/2021    Pt stated goal: To return to PLOF  Pt will be Mod I sup <> sit in prep for EOB ADLs  Pt will be Mod I grooming standing sink side LRAD  Pt will be Mod I UB dressing sitting EOB/long sit   Pt will be Mod I LE dressing sitting EOB/long sit  Pt will be Mod I sit <> stand in prep for toileting LRAD  Pt will be Mod I toileting/toilet transfer/cloth mgmt LRAD    05/28/2021 1014 by Lucile Crater, OTA  Outcome: Progressing  05/28/2021 0749 by Rozann Lesches, PT  Outcome: Progressing      OCCUPATIONAL THERAPY TREATMENT  Patient: [name] (76 y.o. female)  Date: 05/28/2021  Primary Diagnosis: Femur fracture (HCC) [S72.90XA]       Precautions: L LE WBAT  In place during session: External Catheter and EKG/telemetry   Chart, occupational therapy assessment, plan of care, and goals were reviewed.    ASSESSMENT  Patient continues with skilled OT services and is progressing towards goals. Pt supine in bed upon OTA arrival, agreeable to session. Pt alert and oriented to person, place, and situation and motivated to participate in session. Pt transferred to EOB with min-mod A x 2 with good sitting balance unsupported EOB. Pt stood with mod A x 2 and max cueing for weight shifting, sequencing, and safety. Pt able to transfer to bedside recliner with mod A x 2 and max cueing for pivoting of B LEs. (See below for objective details and assist levels). Overall pt tolerated session well today with improved pain management. Pt initially bearing weight mostly on R LE during mobility tasks, but with cueing and increased time able to increase weight  acceptance onto L LE. Pt would benefit from and can tolerate three hours of therapy daily. Will continue to benefit from skilled OT services, and will continue to progress as tolerated. Acute OT will continue to follow.    Other factors to consider for discharge: lives alone, high risk for falls, and concern for safely navigating or managing the home environment        PLAN :  Patient continues to benefit from skilled intervention to address the above impairments.  Continue treatment per established plan of care to address goals.    Recommend with staff: OOB to chair; toilet with BSC    Recommend next OT session: toileting and grooming in bathroom    Recommendation for discharge: (in order for the patient to meet his/her long term goals): Inpatient Rehab Facility    IF patient discharges home will need the following DME: bedside commode and transfer bench       SUBJECTIVE:   Patient stated "I need to sit down for a minute."    OBJECTIVE DATA SUMMARY:   Cognitive/Behavioral Status:  Orientation  Orientation Level: Oriented to person;Oriented to place;Oriented to situation  Cognition  Overall Cognitive Status: WNL    Functional Mobility and Transfers for ADLs:  Bed Mobility:  Bed Mobility Training  Bed Mobility Training: Yes  Overall Level of Assistance: Moderate assistance, Assist X2  Interventions: Manual cues, Verbal cues, Visual cues  Supine to Sit: Minimum assistance, Moderate assistance, Assist X2  Scooting: Moderate assistance, Assist X2     Transfers:   Transfer Training  Transfer Training: Yes  Overall Level of Assistance: Moderate assistance, Assist X2, Additional time  Interventions: Verbal cues, Visual cues, Weight shifting training/pressure relief  Sit to Stand: Moderate assistance, Assist X2, Additional time  Stand to Sit: Minimum assistance, Assist X2  Bed to Chair: Moderate assistance, Assist X2, Additional time      Balance:  Standing: With support  Balance  Sitting: With support  Standing: With  support  Standing - Static: Fair  Standing - Dynamic: Poor      ADL Intervention:  Grooming: Setup     Toileting: Maximum assistance      Pain Rating:  7/10  initially, 5/10 at end of session    Pain Intervention(s):   patient medicated for pain prior to session and repositioning    Activity Tolerance:   Good and SpO2 stable on room air  Please refer to the flowsheet for vital signs taken during this treatment.    After treatment patient left in no apparent distress:   Bed locked and returned to lowest position, Patient left in no apparent distress sitting up in chair, Call bell within reach, and Heels elevated/podus boots applied for pressure relief, and nsg updated     COMMUNICATION/EDUCATION:   The patient's plan of care was discussed with: Physical therapy assistant and Registered nurse  PT/OT sessions occurred together for increased safety of pt and clinician.     Patient Education  Education Given To: Patient  Education Provided: Role of Therapy;Plan of Care;Precautions;Transfer Training;Equipment  Education Method: Demonstration;Verbal;Teach Back  Barriers to Learning: Other (Comment) (high pain levels)  Education Outcome: Verbalized understanding;Demonstrated understanding    Thank you for this referral.  Lucile Crater, OTA  Minutes: 40

## 2021-05-28 NOTE — Progress Notes (Signed)
HOSPITALIST PROGRESS NOTE  [name] Caitlin Callas MD, [facility]   [facility]  [place], [place]         Daily Progress Note: 05/28/2021      Subjective:      Patient is alert and oriented x3.      Pain well controlled for now.      No overnight fever/chills, chest pain, shortness of breath noted.      Postop day 2 status post left hip revision periprosthetic fracture with loose stem.    Tolerating PT and is WBAT on the left lower extremity.    Awaiting placement to SNF versus IRF per case management.      Medications reviewed  Current Facility-Administered Medications   Medication Dose Route Frequency    melatonin tablet 5 mg  5 mg Oral Nightly PRN    ceFAZolin (ANCEF) 1 g injection        acetaminophen (TYLENOL) tablet 1,000 mg  1,000 mg Oral Q6H    albuterol sulfate HFA (PROVENTIL;VENTOLIN;PROAIR) 108 (90 Base) MCG/ACT inhaler 2 puff  2 puff Inhalation Q4H PRN    aspirin chewable tablet 81 mg  81 mg Oral BID    atorvastatin (LIPITOR) tablet 40 mg  40 mg Oral Nightly    budesonide-formoterol (SYMBICORT) 160-4.5 MCG/ACT inhaler 2 puff  2 puff Inhalation BID    And    tiotropium (SPIRIVA RESPIMAT) 2.5 MCG/ACT inhaler 2 puff  2 puff Inhalation Daily    ceFAZolin (ANCEF) 2,000 mg in sterile water 20 mL IV syringe  2,000 mg IntraVENous Q8H    celecoxib (CELEBREX) capsule 100 mg  100 mg Oral BID    metoprolol succinate (TOPROL XL) extended release tablet 25 mg  25 mg Oral Daily    ondansetron (ZOFRAN-ODT) disintegrating tablet 4 mg  4 mg Oral Q6H PRN    Or    ondansetron (ZOFRAN) injection 4 mg  4 mg IntraVENous Q6H PRN    pantoprazole (PROTONIX) tablet 40 mg  40 mg Oral QAM AC    polyethylene glycol (GLYCOLAX) packet 17 g  17 g Oral Daily    sennosides-docusate sodium (SENOKOT-S) 8.6-50 MG tablet 1 tablet  1 tablet Oral BID    sodium chloride flush 0.9 % injection 5-40 mL  5-40 mL IntraVENous 2 times per day    sodium chloride flush 0.9 % injection 5-40 mL  5-40 mL  IntraVENous PRN    0.9 % sodium chloride infusion   IntraVENous PRN    traMADol (ULTRAM) tablet 50 mg  50 mg Oral Q6H PRN       Review of Systems:   Pertinent items are noted in HPI.    Objective:   Physical Exam:     BP (!) 94/57   Pulse 80   Temp 97.7 F (36.5 C) (Oral)   Resp 16   Ht 5\' 3"  (1.6 m)   Wt 112 lb 7 oz (51 kg)   SpO2 96%   BMI 19.92 kg/m       Patient Vitals for the past 8 hrs:   Temp Pulse Resp BP SpO2   05/28/21 1415 97.7 F (36.5 C) 80 16 (!) 94/57 96 %   05/28/21 0930 -- -- -- -- 98 %   05/28/21 0908 98.2 F (36.8 C) (!) 102 18 111/62 93 %   05/28/21 0808 -- 89 18 -- 94 %   05/28/21 0700 98.1 F (36.7 C) 77 18 105/61 --  Temp (24hrs), Avg:97.8 F (36.6 C), Min:97.3 F (36.3 C), Max:98.2 F (36.8 C)    No intake/output data recorded.   No intake/output data recorded.    General:  Alert, cooperative, no distress, appears stated age.   Lungs:   Clear to auscultation bilaterally.   Chest wall:  No tenderness or deformity.   Heart:  Regular rate and rhythm, S1, S2 normal, no murmur, click, rub or gallop.   Abdomen:   Soft, non-tender. Bowel sounds normal. No masses,  No organomegaly.   Extremities: Extremities normal, atraumatic, no cyanosis or edema.   Pulses: 2+ and symmetric all extremities.   Skin: Skin color, texture, turgor normal. No rashes or lesions   Neurologic: No gross sensory or motor deficits     Data Review:       Recent Days:  No results for input(s): WBC, HGB, HCT, PLT in the last 72 hours.  No results for input(s): NA, K, CL, CO2, GLU, BUN, CREA, CA, MG, PHOS, ALB, ALT, INR in the last 72 hours.    Invalid input(s): TBIL, TBILI, SGOT  No results for input(s): PH, PCO2, PO2, HCO3, FIO2 in the last 72 hours.    24 Hour Results:  No results found for this or any previous visit (from the past 24 hour(s)).        Assessment/Plan:      Left femur acute fracture   Pain control. IV morphine PRN   Consult orthopedics    NPO after midnight   Hold anti-coagulant.      Status post femur periprosthetic repair.   Weightbearing as tolerated and will continue to monitor as patient likely will require SNF versus IRF.   Case Management is aware.     Osteopenia   Check vitamin D level. Calcium level within normal limits    Replace as needed.        Recurrent fall   Already has home PT/OT. May need acute rehab after discharge this time. PT/OT evaluation.    Orthostatic vital signs.       COPD   Not in respiratory distress, however diffused crackles on exam.   Chest X-ray normal.   Continue home medications.         Essential hypertension    PO metoprolol        HLP    PO lipitor.       Care Plan discussed with: Patient/Family    Total time spent with patient: 30 minutes.    With greater than 50% spent in coordination of care and counseling.    Demetria Pore, MD

## 2021-05-28 NOTE — Plan of Care (Addendum)
Problem: Physical Therapy - Adult  Goal: By Discharge: Performs mobility at highest level of function for planned discharge setting.  See evaluation for individualized goals.  Description: Problem: Mobility Impaired (Adult and Pediatric)  Goal: *Acute Goals and Plan of Care (Insert Text)  Description: FUNCTIONAL STATUS PRIOR TO ADMISSION: Patient was independent and active without use of DME.    HOME SUPPORT PRIOR TO ADMISSION: The patient lived alone with no local support.    Physical Therapy Goals  Initiated 05/27/2021  Patient/family stated goal: to get stronger and walk   1. Patient will move from supine to sit and sit to supine in bed with supervision/set-up within 7 day(s).   2. Patient will transfer from bed to chair and chair to bed with supervision/set-up using the least restrictive device within 7 day(s).  3. Patient will perform sit to stand with supervision/set-up within 7 day(s).  4. Patient will ambulate with supervision/set-up for 25 feet with the least restrictive device within 7 day(s).   5. Patient will ascend/descend 4 stairs with B handrail(s) with supervision/set-up within 7 day(s).  6. Patient will participate in lower extremity therapeutic exercise/activities with supervision/set-up for 10 minutes within 7 day(s).     Outcome: Progressing            PHYSICAL THERAPY TREATMENT     Patient: [name] (76 y.o. female)  Date: 05/28/2021  Diagnosis: Femur fracture (HCC) [S72.90XA] <principal problem not specified>      Precautions: LLE WBAT, posterior hip precautions  In place during session: External Catheter and EKG/telemetry   Chart, physical therapy assessment, plan of care and goals were reviewed.    ASSESSMENT  Patient continues with skilled PT services and is progressing towards goals. Pt semi-supine upon PT arrival, agreeable to session. Co-treat with OT due to level of assist needed for OOB mobility and transfer training for safety of patient/clinicians. Pt A&O x 4. (See below for  objective details and assist levels).     Overall pt tolerated session fair today with transfer to recliner performed, RW and modA x 2 with additional time. Patient required rest breaks throughout session due to pain and SOB. Patient's SaO2 was 98% on room air while seated EOB. Discharge discussed and agreed upon with supervision PT Su Grand. Co-signature obtained.  Will continue to benefit from skilled PT services, and will continue to progress as tolerated.     Current Level of Function Impacting Discharge (mobility/balance): decr strength, decr standing balance, decr endurance    Other factors to consider for discharge: lives alone, high risk for falls, concern for safely navigating or managing the home environment, and patient has a caregiver that comes to the house to assist with ADLs        PLAN :  Patient continues to benefit from skilled intervention to address impaired functional mobility, decreased independence in ADLs, decreased ROM, impaired strength, decreased activity tolerance, poor safety awareness, impaired balance, increased pain levels. Continue treatment per established plan of care to address goals.    Recommend with staff: Out of bed to chair for meals, Encourage HEP in prep for ADLs/mobility, Use of bed/chair alarm for safety, and LE elevation for management of edema    Recommendation for discharge: (in order for the patient to meet his/her long term goals)  Inpatient Rehabilitation Facility     IF patient discharges home will need the following DME: gait belt and rolling walker       SUBJECTIVE:   Patient stated "I don't know  how much I am going to be able to do."    OBJECTIVE DATA SUMMARY:   Critical Behavior:  Orientation  Orientation Level: Oriented to person;Oriented to place;Oriented to situation  Cognition  Overall Cognitive Status: WNL    Functional Mobility Training:  Bed Mobility:  Bed Mobility Training  Bed Mobility Training: Yes  Overall Level of Assistance: Moderate  assistance;Assist X2  Interventions: Manual cues;Verbal cues;Visual cues  Supine to Sit: Minimum assistance;Moderate assistance;Assist X2  Scooting: Moderate assistance;Assist X2  Transfers:  Art therapist: Yes  Overall Level of Assistance: Moderate assistance;Assist X2;Additional time  Interventions: Verbal cues;Visual cues;Weight shifting training/pressure relief  Sit to Stand: Moderate assistance;Assist X2;Additional time  Stand to Sit: Minimum assistance;Assist X2  Bed to Chair: Moderate assistance;Assist X2;Additional time  Balance:  Balance  Sitting: With support  Standing: With support  Standing - Static: Fair  Standing - Dynamic: Poor  Ambulation/Gait Training:  Gait  Overall Level of Assistance: Moderate assistance;Assist X2;Additional time  Interventions: Manual cues;Safety awareness training;Tactile cues;Verbal cues;Visual cues;Weight shifting training/pressure relief  Base of Support: Narrowed;Shift to right  Speed/Cadence: Shuffled;Slow  Step Length: Right shortened;Left lengthened  Stance: Left decreased  Gait Abnormalities: Antalgic  Distance (ft): 2 Feet  Assistive Device: Gait belt;Walker, rolling      Therapeutic Exercises:   Patient performed seated on EOB, prior to standing     EXERCISE   Sets   Reps   Active Active Assist   Passive Self ROM   Comments   Ankle Pumps   []  []  []  []     Quad Sets/Glut Sets   []  []  []  []     Hamstring Sets   []  []  []  []     Short Arc Quads   []  []  []  []     Heel Slides   []  []  []  []     Straight Leg Raises   []  []  []  []     Hip abd/add   []  []  []  []     Long Arc Quads 1 10 [x]  []  []  []  LLE only    Marching   []  []  []  []        []  []  []  []        Pain Rating:  7/10 at rest but 5/10 once seated in recliner, LLE     Pain Intervention(s):   patient medicated for pain prior to session, rest, elevation, and repositioning    Activity Tolerance:   Fair , requires frequent rest breaks, observed shortness of breath on exertion, and SpO2 stable on room  air    After treatment patient left in no apparent distress:   Bed locked and returned to lowest position, Patient left in no apparent distress sitting up in chair and Call bell within reach, and nsg updated       COMMUNICATION/COLLABORATION:   The patient's plan of care was discussed with: Physical therapist, Occupational therapy assistant, and Registered nurse    Patient Education  Education Given To: Patient  Education Provided: Role of Therapy;Plan of Care;Precautions;Transfer Training;Equipment  Education Method: Demonstration;Verbal;Teach Back  Barriers to Learning: Other (Comment) (high pain levels)  Education Outcome: Verbalized understanding;Demonstrated understanding      , PTA

## 2021-05-29 MED ORDER — TRAMADOL HCL 50 MG PO TABS
50 MG | ORAL | Status: AC
Start: 2021-05-29 — End: 2021-05-29

## 2021-05-29 MED ORDER — ASPIRIN 81 MG PO CHEW
81 MG | ORAL | Status: AC
Start: 2021-05-29 — End: 2021-05-29
  Administered 2021-05-29: 14:00:00 81 via ORAL

## 2021-05-29 MED ORDER — CEFAZOLIN SODIUM 1 G IJ SOLR
1 g | INTRAMUSCULAR | Status: AC
Start: 2021-05-29 — End: 2021-05-29

## 2021-05-29 MED ORDER — SENNOSIDES 8.6 MG PO TABS
8.6 MG | ORAL | Status: AC
Start: 2021-05-29 — End: 2021-05-28
  Administered 2021-05-29: 01:00:00 8.6

## 2021-05-29 MED ORDER — ATORVASTATIN CALCIUM 40 MG PO TABS
40 MG | ORAL | Status: AC
Start: 2021-05-29 — End: 2021-05-28
  Administered 2021-05-29: 01:00:00 40 via ORAL

## 2021-05-29 MED ORDER — SENNOSIDES 8.6 MG PO TABS
8.6 MG | ORAL | Status: AC
Start: 2021-05-29 — End: 2021-05-29
  Administered 2021-05-29: 14:00:00 8.6

## 2021-05-29 MED ORDER — PANTOPRAZOLE SODIUM 40 MG PO TBEC
40 MG | ORAL | Status: AC
Start: 2021-05-29 — End: 2021-05-29
  Administered 2021-05-29: 14:00:00 40 via ORAL

## 2021-05-29 MED ORDER — SENNA-DOCUSATE SODIUM 8.6-50 MG PO TABS
ORAL | Status: AC
Start: 2021-05-29 — End: 2021-05-29
  Administered 2021-05-29: 16:00:00 1 via ORAL

## 2021-05-29 MED ORDER — POLYETHYLENE GLYCOL 3350 17 G PO PACK
17 g | ORAL | Status: AC
Start: 2021-05-29 — End: 2021-05-29
  Administered 2021-05-29: 14:00:00 17 via ORAL

## 2021-05-29 MED ORDER — CELECOXIB 100 MG PO CAPS
100 MG | ORAL | Status: AC
Start: 2021-05-29 — End: 2021-05-28
  Administered 2021-05-29: 01:00:00 100 via ORAL

## 2021-05-29 MED ORDER — ASPIRIN 81 MG PO CHEW
81 MG | ORAL | Status: AC
Start: 2021-05-29 — End: 2021-05-28
  Administered 2021-05-29: 01:00:00 81 via ORAL

## 2021-05-29 MED ORDER — CEFEPIME HCL 2 G IJ SOLR
2 g | INTRAMUSCULAR | Status: DC
Start: 2021-05-29 — End: 2021-05-29

## 2021-05-29 MED ORDER — METOPROLOL SUCCINATE ER 25 MG PO TB24
25 MG | ORAL | Status: AC
Start: 2021-05-29 — End: 2021-05-29
  Administered 2021-05-29: 14:00:00 25 via ORAL

## 2021-05-29 MED ORDER — CELECOXIB 100 MG PO CAPS
100 MG | ORAL | Status: AC
Start: 2021-05-29 — End: 2021-05-29
  Administered 2021-05-29: 14:00:00 100 via ORAL

## 2021-05-29 MED FILL — CEFAZOLIN SODIUM 1 G IJ SOLR: 1 g | INTRAMUSCULAR | Qty: 1000

## 2021-05-29 MED FILL — ACETAMINOPHEN EXTRA STRENGTH 500 MG PO TABS: 500 MG | ORAL | Qty: 2

## 2021-05-29 MED FILL — ASPIRIN LOW STRENGTH 81 MG PO CHEW: 81 MG | ORAL | Qty: 1

## 2021-05-29 MED FILL — CELECOXIB 100 MG PO CAPS: 100 MG | ORAL | Qty: 1

## 2021-05-29 MED FILL — SENNA-LAX 8.6 MG PO TABS: 8.6 MG | ORAL | Qty: 1

## 2021-05-29 MED FILL — CEFEPIME HCL 2 G IJ SOLR: 2 g | INTRAMUSCULAR | Qty: 2000

## 2021-05-29 MED FILL — CEFAZOLIN SODIUM 1 G IJ SOLR: 1 g | INTRAMUSCULAR | Qty: 2000

## 2021-05-29 MED FILL — PANTOPRAZOLE SODIUM 40 MG PO TBEC: 40 MG | ORAL | Qty: 1

## 2021-05-29 MED FILL — TRAMADOL HCL 50 MG PO TABS: 50 MG | ORAL | Qty: 1

## 2021-05-29 MED FILL — PEG 3350 17 G PO PACK: 17 g | ORAL | Qty: 1

## 2021-05-29 MED FILL — STIMULANT LAXATIVE 8.6-50 MG PO TABS: ORAL | Qty: 1

## 2021-05-29 MED FILL — ATORVASTATIN CALCIUM 40 MG PO TABS: 40 MG | ORAL | Qty: 1

## 2021-05-29 MED FILL — METOPROLOL SUCCINATE ER 25 MG PO TB24: 25 MG | ORAL | Qty: 1

## 2021-05-29 MED FILL — MELATONIN 5 MG PO TABS: 5 MG | ORAL | Qty: 1

## 2021-05-29 NOTE — Progress Notes (Signed)
Orthopedic Progress Note    Date:05/29/2021       Room:439/01  Patient Name:[name]     Date of Birth:[birth year]     Age:76 y.o.    SUBJECTIVE    : She did well overnight.  Pain is well controlled.  She is currently sitting in the bed with the blinds open.  She did not sleep very much overnight.  She denies any calf pain.  She was able to get up with occupational and physical therapy yesterday.    : Patient sitting in recliner without complaints this morning.  Progressing with physical therapy.  Concerned that she is unable to afford SNF or inpatient rehab.  She would like to go home if at all possible and she will ensure she has 24-hour assistance.  Denies any shortness of breath or chest pain.  No nausea vomiting.  No calf pain.    VITALS         Temp:  Temp  Avg: 98.1 F (36.7 C)  Min: 97.7 F (36.5 C)  Max: 98.4 F (36.9 C)  HR: Pulse  Avg: 82.4  Min: 68  Max: 110  BP: Systolic (24hrs), Avg:109 , Min:94 , Max:118   ; Diastolic (24hrs), Avg:64, Min:57, Max:72    RR: Resp  Avg: 17.8  Min: 16  Max: 20  SaO2: SpO2  Avg: 95.8 %  Min: 95 %  Max: 98 %  LABS    Not applicable    Physical Exam   AAOx2  Non-labored Respirations  Abdomen Non-tender    Left lower extremity: Dressing is in place with no drainage.  She is neurovascular intact throughout the left lower extremity.  She is no longer tender about the lateral aspect of the knee.  No calf tenderness.    IMAGING   Postoperative radiographs demonstrate well-positioned revision hemiarthroplasty.  No evidence of fracture.    ASSESSMENT/PLAN           76 year old female postop day 4 from left hip hemiarthroplasty revision due to periprosthetic fracture.  She is doing well.  Continue with dementia precautions with blinds and lights on during the day.  She will likely be discharged to either a SNF or inpatient rehab based off of therapy and insurance, but she would like to consider going home if she is going to have to pay.  She is cleared to be discharged  from a orthopedic standpoint.      Electronically signed by Helene Kelp, MD on 05/29/2021 at 10:35 AM

## 2021-05-29 NOTE — Progress Notes (Signed)
Assessment complete. Patient denies pain to hip while laying in bed. Premedicated prior to working with PT.

## 2021-05-29 NOTE — Plan of Care (Signed)
Problem: Physical Therapy - Adult  Goal: By Discharge: Performs mobility at highest level of function for planned discharge setting.  See evaluation for individualized goals.  Description: Problem: Mobility Impaired (Adult and Pediatric)  Goal: *Acute Goals and Plan of Care (Insert Text)  Description: FUNCTIONAL STATUS PRIOR TO ADMISSION: Patient was independent and active without use of DME.    HOME SUPPORT PRIOR TO ADMISSION: The patient lived alone with no local support.    Physical Therapy Goals  Initiated 05/27/2021  Patient/family stated goal: to get stronger and walk   1. Patient will move from supine to sit and sit to supine in bed with supervision/set-up within 7 day(s).   2. Patient will transfer from bed to chair and chair to bed with supervision/set-up using the least restrictive device within 7 day(s).  3. Patient will perform sit to stand with supervision/set-up within 7 day(s).  4. Patient will ambulate with supervision/set-up for 25 feet with the least restrictive device within 7 day(s).   5. Patient will ascend/descend 4 stairs with B handrail(s) with supervision/set-up within 7 day(s).  6. Patient will participate in lower extremity therapeutic exercise/activities with supervision/set-up for 10 minutes within 7 day(s).     Outcome: Progressing            PHYSICAL THERAPY TREATMENT     Patient: [name] (76 y.o. female)  Date: 05/29/2021  Diagnosis: Femur fracture (HCC) [S72.90XA] <principal problem not specified>      Precautions: posterior hip precautions                  In place during session: Peripheral IV, External Catheter, and EKG/telemetry   Chart, physical therapy assessment, plan of care and goals were reviewed.    ASSESSMENT  Patient continues with skilled PT services and is progressing towards goals. Pt semi-supine upon PT arrival, agreeable to session. Co-treat with OT due to level of assist for OOB mobility for safety of patient/clinicians and pain levels. Pt A&O x 4, motivated to  participate and get better because she wants to go home. (See below for objective details and assist levels).     Overall pt tolerated session well today with transfer to [facility] and then recliner performed. Less assistance needed to get to EOB and for standing with RW. Patient took steps to [facility] and then stood for pericare with assistance. Patient then ambulated 5 feet to recliner with RW and min-modA, incr assistance needed as patient fatigued and for AD management. Reviewed posterior hip precautions with patient. Will continue to benefit from skilled PT services, and will continue to progress as tolerated.     Current Level of Function Impacting Discharge (mobility/balance): decr strength, decr mobility, decr standing balance     Other factors to consider for discharge: lives alone, high risk for falls, and concern for safely navigating or managing the home environment       PLAN :  Patient continues to benefit from skilled intervention to address impaired functional mobility, impaired ability to perform high-level IADLs, decreased ROM, impaired strength, decreased activity tolerance, impaired balance. Continue treatment per established plan of care to address goals.    Recommend with staff: Out of bed to chair for meals, Encourage HEP in prep for ADLs/mobility, and LE elevation for management of edema    Recommendation for discharge: (in order for the patient to meet his/her long term goals)  Inpatient Rehabilitation Facility     IF patient discharges home will need the following DME: gait belt and  rolling walker       SUBJECTIVE:   Patient stated "here come the girls to work me again."    OBJECTIVE DATA SUMMARY:   Critical Behavior:  Orientation  Overall Orientation Status: Within Normal Limits  Orientation Level: Oriented X4  Cognition  Overall Cognitive Status: WNL    Functional Mobility Training:  Bed Mobility:  Bed Mobility Training  Bed Mobility Training: Yes  Interventions: Verbal cues;Tactile cues  Supine to  Sit: Minimum assistance;Assist X1;Additional time  Scooting: Contact-guard assistance;Assist X1;Additional time;Other (comment) (with use of HR)  Transfers:  Art therapist: Yes  Interventions: Verbal cues;Tactile cues;Weight shifting training/pressure relief  Sit to Stand: Minimum assistance;Assist X2;Additional time;Other (comment) (bed elevated)  Stand to Sit: Contact-guard assistance;Assist X2  Bed to Chair: Minimum assistance;Moderate assistance;Assist X1;Additional time  Toilet Transfer: Minimum assistance;Assist X2;Additional time  Balance:  Balance  Sitting: With support  Standing: With support  Standing - Static: Fair;Constant support  Standing - Dynamic: Fair;Constant support   Wheelchair Mobility:   Wheelchair Management  Wheelchair Management: No  Ambulation/Gait Training:     Gait  Overall Level of Assistance: Minimum assistance;Moderate assistance;Assist X1;Additional time  Interventions: Tactile cues;Verbal cues  Base of Support: Narrowed  Speed/Cadence: Shuffled;Slow  Step Length: Right shortened;Left lengthened  Stance: Left decreased  Gait Abnormalities: Antalgic  Distance (ft): 5 Feet  Assistive Device: Gait belt;Walker, rolling      Therapeutic Exercises:   Reviewed ankle pumps with patient in bed    EXERCISE   Sets   Reps   Active Active Assist   Passive Self ROM   Comments   Ankle Pumps 1 10 [x]  []  []  []     Quad Sets/Glut Sets   []  []  []  []     Hamstring Sets   []  []  []  []     Short Arc Quads   []  []  []  []     Heel Slides   []  []  []  []     Straight Leg Raises   []  []  []  []     Hip abd/add   []  []  []  []     Long Arc Quads   []  []  []  []     Marching   []  []  []  []        []  []  []  []        Pain Rating:  2/10 at rest in L hip    Pain Intervention(s):   nursing notified and addressing    Activity Tolerance:   Fair  and requires rest breaks    After treatment patient left in no apparent distress:   Bed locked and returned to lowest position, Patient left in no apparent distress sitting  up in chair and Call bell within reach, and nsg updated     COMMUNICATION/COLLABORATION:   The patient's plan of care was discussed with: Physical therapist, Occupational therapy assistant, and Registered nurse    Patient Education  Education Given To: Patient  Education Provided: Role of Therapy;Plan of Care;Precautions;Transfer Training;Equipment  Education Method: Demonstration;Verbal;Teach Back  Education Outcome: Verbalized understanding;Demonstrated understanding    , PTA

## 2021-05-29 NOTE — Plan of Care (Signed)
Problem: Discharge Planning  Goal: Discharge to home or other facility with appropriate resources  Outcome: Progressing     Problem: Pain  Goal: Verbalizes/displays adequate comfort level or baseline comfort level  Outcome: Progressing     Problem: Safety - Adult  Goal: Free from fall injury  Outcome: Progressing     Problem: Skin/Tissue Integrity  Goal: Absence of new skin breakdown  Description: 1.  Monitor for areas of redness and/or skin breakdown  2.  Assess vascular access sites hourly  3.  Every 4-6 hours minimum:  Change oxygen saturation probe site  4.  Every 4-6 hours:  If on nasal continuous positive airway pressure, respiratory therapy assess nares and determine need for appliance change or resting period.  Outcome: Progressing

## 2021-05-29 NOTE — Progress Notes (Signed)
HOSPITALIST PROGRESS NOTE  Lauris Serviss Raleigh Callas MD, [facility] Medicine    [place] [facility]   [place], [place]         Daily Progress Note: 05/29/2021      Subjective:      Patient is alert and oriented x3.      Pain well controlled for now.      No overnight fever/chills, chest pain, shortness of breath noted.      Postop day 4 status post left hip revision periprosthetic fracture with loose stem.    Tolerating PT and is WBAT on the left lower extremity.    Awaiting placement to SNF versus IRF per case management.      Medications reviewed  Current Facility-Administered Medications   Medication Dose Route Frequency    ceFAZolin (ANCEF) 1 g injection        melatonin tablet 5 mg  5 mg Oral Nightly PRN    acetaminophen (TYLENOL) tablet 1,000 mg  1,000 mg Oral Q6H    albuterol sulfate HFA (PROVENTIL;VENTOLIN;PROAIR) 108 (90 Base) MCG/ACT inhaler 2 puff  2 puff Inhalation Q4H PRN    aspirin chewable tablet 81 mg  81 mg Oral BID    atorvastatin (LIPITOR) tablet 40 mg  40 mg Oral Nightly    budesonide-formoterol (SYMBICORT) 160-4.5 MCG/ACT inhaler 2 puff  2 puff Inhalation BID    And    tiotropium (SPIRIVA RESPIMAT) 2.5 MCG/ACT inhaler 2 puff  2 puff Inhalation Daily    ceFAZolin (ANCEF) 2,000 mg in sterile water 20 mL IV syringe  2,000 mg IntraVENous Q8H    celecoxib (CELEBREX) capsule 100 mg  100 mg Oral BID    metoprolol succinate (TOPROL XL) extended release tablet 25 mg  25 mg Oral Daily    ondansetron (ZOFRAN-ODT) disintegrating tablet 4 mg  4 mg Oral Q6H PRN    Or    ondansetron (ZOFRAN) injection 4 mg  4 mg IntraVENous Q6H PRN    pantoprazole (PROTONIX) tablet 40 mg  40 mg Oral QAM AC    polyethylene glycol (GLYCOLAX) packet 17 g  17 g Oral Daily    sennosides-docusate sodium (SENOKOT-S) 8.6-50 MG tablet 1 tablet  1 tablet Oral BID    sodium chloride flush 0.9 % injection 5-40 mL  5-40 mL IntraVENous 2 times per day    sodium chloride flush 0.9 % injection 5-40 mL  5-40 mL  IntraVENous PRN    0.9 % sodium chloride infusion   IntraVENous PRN    traMADol (ULTRAM) tablet 50 mg  50 mg Oral Q6H PRN       Review of Systems:   Pertinent items are noted in HPI.    Objective:   Physical Exam:     BP 107/72   Pulse (!) 110   Temp 98.1 F (36.7 C)   Resp 19   Ht 5\' 3"  (1.6 m)   Wt 112 lb 7 oz (51 kg)   SpO2 95%   BMI 19.92 kg/m       Patient Vitals for the past 8 hrs:   Temp Pulse Resp BP SpO2   05/29/21 0945 -- (!) 110 -- -- --   05/29/21 0545 98.1 F (36.7 C) 68 19 107/72 95 %          Temp (24hrs), Avg:98.1 F (36.7 C), Min:97.7 F (36.5 C), Max:98.4 F (36.9 C)    No intake/output data recorded.   05/05 1901 - 05/07 0700  In: 250 [P.O.:250]  Out: 500 [Urine:500]    General:  Alert, cooperative, no distress, appears stated age.   Lungs:   Clear to auscultation bilaterally.   Chest wall:  No tenderness or deformity.   Heart:  Regular rate and rhythm, S1, S2 normal, no murmur, click, rub or gallop.   Abdomen:   Soft, non-tender. Bowel sounds normal. No masses,  No organomegaly.   Extremities: Extremities normal, atraumatic, no cyanosis or edema.   Pulses: 2+ and symmetric all extremities.   Skin: Skin color, texture, turgor normal. No rashes or lesions   Neurologic: No gross sensory or motor deficits     Data Review:       Recent Days:  No results for input(s): WBC, HGB, HCT, PLT in the last 72 hours.  No results for input(s): NA, K, CL, CO2, GLU, BUN, CREA, CA, MG, PHOS, ALB, ALT, INR in the last 72 hours.    Invalid input(s): TBIL, TBILI, SGOT  No results for input(s): PH, PCO2, PO2, HCO3, FIO2 in the last 72 hours.    24 Hour Results:  No results found for this or any previous visit (from the past 24 hour(s)).        Assessment/Plan:      Left femur acute fracture   Pain control. IV morphine PRN   Consult orthopedics    NPO after midnight   Hold anti-coagulant.     Status post femur periprosthetic repair.   Weightbearing as tolerated and will continue to monitor as patient likely  will require SNF versus IRF.   Case Management is aware.     Osteopenia   Check vitamin D level. Calcium level within normal limits    Replace as needed.        Recurrent fall   Already has home PT/OT. May need acute rehab after discharge this time. PT/OT evaluation.    Orthostatic vital signs.       COPD   Not in respiratory distress, however diffused crackles on exam.   Chest X-ray normal.   Continue home medications.         Essential hypertension    PO metoprolol        HLP    PO lipitor.       Care Plan discussed with: Patient/Family    Total time spent with patient: 30 minutes.    With greater than 50% spent in coordination of care and counseling.    Demetria Pore, MD

## 2021-05-29 NOTE — Plan of Care (Signed)
Problem: Pain  Goal: Verbalizes/displays adequate comfort level or baseline comfort level  05/29/2021 2246 by Winona Legato, RN  Outcome: Progressing  05/29/2021 1302 by Donell Beers, RN  Outcome: Progressing     Problem: Occupational Therapy - Adult  Goal: By Discharge: Performs self-care activities at highest level of function for planned discharge setting.  See evaluation for individualized goals.  Description: Description: FUNCTIONAL STATUS PRIOR TO ADMISSION: Pt states she was ind with ADLs with no AD PTA.     HOME SUPPORT: Lives alone    Occupational Therapy Goals  Initiated 05/27/2021    Pt stated goal: To return to PLOF  Pt will be Mod I sup <> sit in prep for EOB ADLs  Pt will be Mod I grooming standing sink side LRAD  Pt will be Mod I UB dressing sitting EOB/long sit   Pt will be Mod I LE dressing sitting EOB/long sit  Pt will be Mod I sit <> stand in prep for toileting LRAD  Pt will be Mod I toileting/toilet transfer/cloth mgmt LRAD    05/29/2021 1043 by Lenoria Farrier, OT  Outcome: Progressing     Problem: Physical Therapy - Adult  Goal: By Discharge: Performs mobility at highest level of function for planned discharge setting.  See evaluation for individualized goals.  Description: Problem: Mobility Impaired (Adult and Pediatric)  Goal: *Acute Goals and Plan of Care (Insert Text)  Description: FUNCTIONAL STATUS PRIOR TO ADMISSION: Patient was independent and active without use of DME.    HOME SUPPORT PRIOR TO ADMISSION: The patient lived alone with no local support.    Physical Therapy Goals  Initiated 05/27/2021  Patient/family stated goal: to get stronger and walk   1. Patient will move from supine to sit and sit to supine in bed with supervision/set-up within 7 day(s).   2. Patient will transfer from bed to chair and chair to bed with supervision/set-up using the least restrictive device within 7 day(s).  3. Patient will perform sit to stand with supervision/set-up within 7 day(s).  4. Patient will  ambulate with supervision/set-up for 25 feet with the least restrictive device within 7 day(s).   5. Patient will ascend/descend 4 stairs with B handrail(s) with supervision/set-up within 7 day(s).  6. Patient will participate in lower extremity therapeutic exercise/activities with supervision/set-up for 10 minutes within 7 day(s).     05/29/2021 0931 by Tanna Furry, PTA  Outcome: Progressing     Problem: Skin/Tissue Integrity  Goal: Absence of new skin breakdown  Description: 1.  Monitor for areas of redness and/or skin breakdown  2.  Assess vascular access sites hourly  3.  Every 4-6 hours minimum:  Change oxygen saturation probe site  4.  Every 4-6 hours:  If on nasal continuous positive airway pressure, respiratory therapy assess nares and determine need for appliance change or resting period.  05/29/2021 1302 by Donell Beers, RN  Outcome: Progressing

## 2021-05-29 NOTE — Plan of Care (Signed)
OCCUPATIONAL THERAPY TREATMENT  Patient: [name] (76 y.o. female)  Date: 05/29/2021  Primary Diagnosis: Femur fracture (HCC) [S72.90XA]       Precautions: falls, LLE WBAT, posterior hip precautions  In place during session: External Catheter  Chart, occupational therapy assessment, plan of care, and goals were reviewed.    ASSESSMENT  Patient continues with skilled OT services and is progressing towards goals. Pt supine in bed upon OT/PTA arrival, agreeable to session. Pt A&O x 4 and agreeable to treatment. (See below for objective details and assist levels).     Overall pt tolerated session fair today with c/o pain of 2-3/10 pain in LLE at rest but reports slight increase w/ activity to 5/10. Pt demo'd improved functional mobility this date but continues to req A for OOB mobility. Pt req'd less verbal cues for walking this date and able to tolerate two standing periods for pericare; req'd A for standing balance. OT re-educated pt on posterior hip precautions and pt would benefit from continued education. Will continue to benefit from skilled OT services, and will continue to progress as tolerated. Upon discussion w/ pt, pt reports she wishes to discharge home but will not have 24/7 care.  At this time, this is not a safe discharge plan for pt d/t A req'd.    Other factors to consider for discharge: lives alone, high risk for falls, concern for safely navigating or managing the home environment, and does not have 24/7 A         PLAN :  Patient continues to benefit from skilled intervention to address the above impairments.  Continue treatment per established plan of care to address goals.    Recommend with staff: Out of bed to chair for meals and BSC transfer    Recommend next OT session:  AE for dressing techniques     Recommendation for discharge: (in order for the patient to meet his/her long term goals): Inpatient Rehabilitation Facility     IF patient discharges home will need the following DME: TBD at next  placement        SUBJECTIVE:   Patient stated "I don't want to go to rehab it cost too much."    OBJECTIVE DATA SUMMARY:   Cognitive/Behavioral Status:  Orientation  Overall Orientation Status: Within Normal Limits  Orientation Level: Oriented X4  Cognition  Overall Cognitive Status: WNL    Functional Mobility and Transfers for ADLs:  Bed Mobility:  Bed Mobility Training  Bed Mobility Training: Yes  Overall Level of Assistance: Moderate assistance, Assist X2  Interventions: Verbal cues, Tactile cues  Supine to Sit: Minimum assistance, Assist X1, Additional time  Scooting: Contact-guard assistance, Assist X1, Additional time, Other (comment) (with use of HR)     Transfers:   Transfer Training  Transfer Training: Yes  Overall Level of Assistance: Moderate assistance, Assist X2, Additional time  Interventions: Verbal cues, Tactile cues, Weight shifting training/pressure relief  Sit to Stand: Minimum assistance, Assist X2, Additional time, Other (comment) (bed elevated)  Stand to Sit: Contact-guard assistance, Assist X2  Bed to Chair: Minimum assistance, Moderate assistance, Assist X1, Additional time  Toilet Transfer: Minimum assistance, Assist X2, Additional time      Balance:  Standing: With support  Balance  Sitting: With support  Standing: With support  Standing - Static: Fair, Constant support  Standing - Dynamic: Fair, Constant support      ADL Intervention:  LE Dressing: Dependent/Total  LE Dressing Skilled Clinical Factors: socks d/t posterior hip precautions    Toileting: Dependent/Total  Toileting Skilled Clinical Factors: PTA provided support for standing balance               Pain Rating:  See note above     Pain Intervention(s):   nursing notified; pain medication provided prior to session    Activity Tolerance:   Fair  and requires rest breaks      After treatment patient left in no apparent distress:   Bed locked and returned to lowest position, Patient  left in no apparent distress sitting up in chair and Call bell within reach, and nsg updated     COMMUNICATION/EDUCATION:   The patient's plan of care was discussed with: Physical therapy assistant and Registered nurse      Patient Education  Education Given To: Patient  Education Provided: Role of Therapy;Plan of Care;Precautions;Transfer Training;Equipment  Education Method: Demonstration;Verbal;Teach Back  Education Outcome: Verbalized understanding;Demonstrated understanding    Thank you for this referral.  Lenoria Farrier, OT  Minutes: 41   Problem: Occupational Therapy - Adult  Goal: By Discharge: Performs self-care activities at highest level of function for planned discharge setting.  See evaluation for individualized goals.  Description: Description: FUNCTIONAL STATUS PRIOR TO ADMISSION: Pt states she was ind with ADLs with no AD PTA.     HOME SUPPORT: Lives alone    Occupational Therapy Goals  Initiated 05/27/2021    Pt stated goal: To return to PLOF  Pt will be Mod I sup <> sit in prep for EOB ADLs  Pt will be Mod I grooming standing sink side LRAD  Pt will be Mod I UB dressing sitting EOB/long sit   Pt will be Mod I LE dressing sitting EOB/long sit  Pt will be Mod I sit <> stand in prep for toileting LRAD  Pt will be Mod I toileting/toilet transfer/cloth mgmt LRAD    Outcome: Progressing

## 2021-05-30 MED FILL — CELECOXIB 100 MG PO CAPS: 100 MG | ORAL | Qty: 1

## 2021-05-30 MED FILL — CEFAZOLIN SODIUM 1 G IJ SOLR: 1 g | INTRAMUSCULAR | Qty: 1000

## 2021-05-30 MED FILL — ACETAMINOPHEN EXTRA STRENGTH 500 MG PO TABS: 500 MG | ORAL | Qty: 2

## 2021-05-30 MED FILL — PANTOPRAZOLE SODIUM 40 MG PO TBEC: 40 MG | ORAL | Qty: 1

## 2021-05-30 MED FILL — STIMULANT LAXATIVE 8.6-50 MG PO TABS: ORAL | Qty: 1

## 2021-05-30 MED FILL — ASPIRIN LOW STRENGTH 81 MG PO CHEW: 81 MG | ORAL | Qty: 1

## 2021-05-30 MED FILL — SPIRIVA RESPIMAT 2.5 MCG/ACT IN AERS: 2.5 MCG/ACT | RESPIRATORY_TRACT | Qty: 4

## 2021-05-30 MED FILL — TRAMADOL HCL 50 MG PO TABS: 50 MG | ORAL | Qty: 1

## 2021-05-30 MED FILL — METOPROLOL SUCCINATE ER 25 MG PO TB24: 25 MG | ORAL | Qty: 1

## 2021-05-30 MED FILL — ATORVASTATIN CALCIUM 40 MG PO TABS: 40 MG | ORAL | Qty: 1

## 2021-05-30 MED FILL — MELATONIN 5 MG PO TABS: 5 MG | ORAL | Qty: 1

## 2021-05-30 NOTE — Progress Notes (Signed)
HOSPITALIST PROGRESS NOTE  Karan Ramnauth Raleigh Callas MD, [facility]    [facility]   [place], [place]         Daily Progress Note: 05/30/2021      Subjective:      Patient is alert and oriented x3.      Pain well controlled for now.      No overnight fever/chills, chest pain, shortness of breath noted.      Postop day 5 status post left hip revision periprosthetic fracture with loose stem.    Tolerating PT and is WBAT on the left lower extremity.    Awaiting placement to SNF versus IRF per case management.      Medications reviewed  Current Facility-Administered Medications   Medication Dose Route Frequency    melatonin tablet 5 mg  5 mg Oral Nightly PRN    acetaminophen (TYLENOL) tablet 1,000 mg  1,000 mg Oral Q6H    albuterol sulfate HFA (PROVENTIL;VENTOLIN;PROAIR) 108 (90 Base) MCG/ACT inhaler 2 puff  2 puff Inhalation Q4H PRN    aspirin chewable tablet 81 mg  81 mg Oral BID    atorvastatin (LIPITOR) tablet 40 mg  40 mg Oral Nightly    budesonide-formoterol (SYMBICORT) 160-4.5 MCG/ACT inhaler 2 puff  2 puff Inhalation BID    And    tiotropium (SPIRIVA RESPIMAT) 2.5 MCG/ACT inhaler 2 puff  2 puff Inhalation Daily    ceFAZolin (ANCEF) 2,000 mg in sterile water 20 mL IV syringe  2,000 mg IntraVENous Q8H    celecoxib (CELEBREX) capsule 100 mg  100 mg Oral BID    metoprolol succinate (TOPROL XL) extended release tablet 25 mg  25 mg Oral Daily    ondansetron (ZOFRAN-ODT) disintegrating tablet 4 mg  4 mg Oral Q6H PRN    Or    ondansetron (ZOFRAN) injection 4 mg  4 mg IntraVENous Q6H PRN    pantoprazole (PROTONIX) tablet 40 mg  40 mg Oral QAM AC    polyethylene glycol (GLYCOLAX) packet 17 g  17 g Oral Daily    sennosides-docusate sodium (SENOKOT-S) 8.6-50 MG tablet 1 tablet  1 tablet Oral BID    sodium chloride flush 0.9 % injection 5-40 mL  5-40 mL IntraVENous 2 times per day    sodium chloride flush 0.9 % injection 5-40 mL  5-40 mL IntraVENous PRN    0.9 % sodium  chloride infusion   IntraVENous PRN    traMADol (ULTRAM) tablet 50 mg  50 mg Oral Q6H PRN       Review of Systems:   Pertinent items are noted in HPI.    Objective:   Physical Exam:     BP 102/62   Pulse 91   Temp 97.8 F (36.6 C) (Oral)   Resp 16   Ht 5\' 3"  (1.6 m)   Wt 112 lb 7 oz (51 kg)   SpO2 97%   BMI 19.92 kg/m       Patient Vitals for the past 8 hrs:   Temp Pulse Resp BP   05/30/21 0830 97.8 F (36.6 C) 91 16 102/62   05/30/21 0321 97.9 F (36.6 C) 77 16 106/67          Temp (24hrs), Avg:97.8 F (36.6 C), Min:97.6 F (36.4 C), Max:97.9 F (36.6 C)    No intake/output data recorded.   05/06 1901 - 05/08 0700  In: 250 [P.O.:250]  Out: 950 [Urine:950]    General:  Alert, cooperative, no distress, appears  stated age.   Lungs:   Clear to auscultation bilaterally.   Chest wall:  No tenderness or deformity.   Heart:  Regular rate and rhythm, S1, S2 normal, no murmur, click, rub or gallop.   Abdomen:   Soft, non-tender. Bowel sounds normal. No masses,  No organomegaly.   Extremities: Extremities normal, atraumatic, no cyanosis or edema.   Pulses: 2+ and symmetric all extremities.   Skin: Skin color, texture, turgor normal. No rashes or lesions   Neurologic: No gross sensory or motor deficits     Data Review:       Recent Days:  No results for input(s): WBC, HGB, HCT, PLT in the last 72 hours.  No results for input(s): NA, K, CL, CO2, GLU, BUN, CREA, CA, MG, PHOS, ALB, ALT, INR in the last 72 hours.    Invalid input(s): TBIL, TBILI, SGOT  No results for input(s): PH, PCO2, PO2, HCO3, FIO2 in the last 72 hours.    24 Hour Results:  No results found for this or any previous visit (from the past 24 hour(s)).        Assessment/Plan:      Left femur acute fracture   Pain control. IV morphine PRN   Consult orthopedics    NPO after midnight   Hold anti-coagulant.     Status post femur periprosthetic repair.   Weightbearing as tolerated and will continue to monitor as patient likely will require SNF versus  IRF.   Case Management is aware.     Osteopenia   Check vitamin D level. Calcium level within normal limits    Replace as needed.        Recurrent fall   Already has home PT/OT. May need acute rehab after discharge this time. PT/OT evaluation.    Orthostatic vital signs.       COPD   Not in respiratory distress, however diffused crackles on exam.   Chest X-ray normal.   Continue home medications.         Essential hypertension    PO metoprolol        HLP    PO lipitor.       Care Plan discussed with: Patient/Family    Total time spent with patient: 30 minutes.    With greater than 50% spent in coordination of care and counseling.    Demetria Pore, MD

## 2021-05-30 NOTE — Plan of Care (Signed)
Problem: Physical Therapy - Adult  Goal: By Discharge: Performs mobility at highest level of function for planned discharge setting.  See evaluation for individualized goals.  Description: Problem: Mobility Impaired (Adult and Pediatric)  Goal: *Acute Goals and Plan of Care (Insert Text)  Description: FUNCTIONAL STATUS PRIOR TO ADMISSION: Patient was independent and active without use of DME.    HOME SUPPORT PRIOR TO ADMISSION: The patient lived alone with no local support.    Physical Therapy Goals  Initiated 05/27/2021  Patient/family stated goal: to get stronger and walk   1. Patient will move from supine to sit and sit to supine in bed with supervision/set-up within 7 day(s).   2. Patient will transfer from bed to chair and chair to bed with supervision/set-up using the least restrictive device within 7 day(s).  3. Patient will perform sit to stand with supervision/set-up within 7 day(s).  4. Patient will ambulate with supervision/set-up for 25 feet with the least restrictive device within 7 day(s).   5. Patient will ascend/descend 4 stairs with B handrail(s) with supervision/set-up within 7 day(s).  6. Patient will participate in lower extremity therapeutic exercise/activities with supervision/set-up for 10 minutes within 7 day(s).     Outcome: Progressing            PHYSICAL THERAPY TREATMENT     Patient: [name] (76 y.o. female)  Date: 05/30/2021  Diagnosis: Femur fracture (HCC) [S72.90XA] <principal problem not specified>      Precautions: Posterior hip precautions   In place during session: EKG/telemetry   Chart, physical therapy assessment, plan of care and goals were reviewed.    ASSESSMENT  Patient continues with skilled PT services and is progressing towards goals. Pt received  upon PT/OT arrival, agreeable to session. Pt A&O x 4. (See below for objective details and assist levels).     Overall pt tolerated session fair today with Tfs and gt. training. Pt. Continues to require assist of 2 for  balance and safety. Gt. Slow and unsteady. Pt. Required constant vcs for proper walker and foot placement during ambulation. Pt. Leaning posteriorly at times with ambulation and requiring Min A X 2 for balance. Pt. Fatigues easily and requires several rest breaks throughout session.  Will continue to benefit from skilled PT services, and will continue to progress as tolerated.     Current Level of Function Impacting Discharge (mobility/balance): /Min/Mod A X 2 for Mobility    Other factors to consider for discharge: poor safety awareness, high risk for falls, not safe to be alone, and concern for safely navigating or managing the home environment       PLAN :  Patient continues to benefit from skilled intervention to address impaired functional mobility, impaired strength, poor body mechanics, decreased activity tolerance, poor safety awareness, impaired balance, impaired posture. Continue treatment per established plan of care to address goals.    Recommend with staff: Out of bed to chair for meals, Encourage HEP in prep for ADLs/mobility, Use of bed/chair alarm for safety, and LE elevation for management of edema    Recommendation for discharge: (in order for the patient to meet his/her long term goals)  Inpatient Rehabilitation Facility     IF patient discharges home will need the following DME: TBD       SUBJECTIVE:   Patient stated ""My hip doesn't hurt while I'm sitting."    OBJECTIVE DATA SUMMARY:   Critical Behavior:  Orientation  Orientation Level: Oriented X4  Cognition  Overall Cognitive Status: Exceptions  Arousal/Alertness: Appropriate responses to stimuli  Safety Judgement: Decreased awareness of need for safety;Decreased awareness of need for assistance  Sequencing: Requires cues for all    Functional Mobility Training:  Bed Mobility:  Bed Mobility Training  Interventions: Verbal cues  Supine to Sit: Minimum assistance  Scooting: Minimum assistance  Transfers:  Transfer Training  Interventions:  Demonstration;Verbal cues  Sit to Stand: Moderate assistance;Assist X2  Stand to Sit: Moderate assistance;Assist X2  Bed to Chair: Minimum assistance;Assist X2  Balance:  Balance  Sitting: Intact (with use of railing)  Standing: Impaired  Standing - Static: Poor  Standing - Dynamic: Poor     Ambulation/Gait Training:     Gait  Interventions: Verbal cues;Demonstration  Base of Support: Narrowed  Speed/Cadence: Slow  Step Length: Left shortened;Right shortened  Gait Abnormalities: Step to gait  Distance (ft): 9 Feet  Assistive Device: Gait belt;Walker, rolling      Therapeutic Exercises:       EXERCISE   Sets   Reps   Active Active Assist   Passive Self ROM   Comments   Ankle Pumps  20 [x]  []  []  []     Quad Sets/Glut Sets  20 [x]  []  []  []     Hamstring Sets   []  []  []  []     Short Arc Quads   []  []  []  []     Heel Slides 2 10 [x]  []  []  []     Straight Leg Raises   []  []  []  []     Hip abd/add     2 10 []  [x]  []  []   Mod assist L LE   Long Arc Quads   2 10 [x]  []  []  []     Marching   []  []  []  []        []  []  []  []        Pain Rating:  2-3/10 L hip with mobility  Pain Intervention(s):   nursing notified and addressing and elevation    Activity Tolerance:   Fair  and requires rest breaks    After treatment patient left in no apparent distress:   Bed locked and returned to lowest position, Patient left in no apparent distress sitting up in chair and Call bell within reach, and nsg updated     COMMUNICATION/COLLABORATION:   The patient's plan of care was discussed with: Occupational therapy assistant and Registered nurse    Patient Education  Education Given To: Patient  Education Provided: Role of Therapy;Home Exercise Program;Precautions;Transfer Training  Education Method: Verbal;Teach Back;Demonstration  Barriers to Learning: None  Education Outcome: Verbalized understanding;Continued education needed        PT/OT sessions occurred together for increased safety of pt and clinician.     , PTA  Minutes:  4

## 2021-05-30 NOTE — Consults (Incomplete)
Rehab Consult    Patient: [name] MRN: [id number]  SSN: [id number]    Date of Birth: Jul 27, 1945  Age: 76 y.o.  Sex: female      Consulting provider: Raleigh Callas, MD  Reason for consult: Evaluate for rehab needs     Admit date: 05/24/2021  LOS (days): 6    CC: Difficulty walking     Subjective:      This is a 76 y.o. female who was found to have a left femur fracture after a GLF. She underwent a left hemiarthroplasty. Post op she is WBAT per ortho. Noted to have recurrent falls at home. Patient seen today and notes she does not feel safe to return home at this time, though reports financial concerns about IRF vs SNF. Endorses pain currently controlled.         Functional History -   Baseline: Independent with mobility/transfers/ADLs. Uses walker    Current: Mod assist for most transfers, ADLs, and mobility    Living situation: lives alone    Past Medical History:   Diagnosis Date    Chronic obstructive pulmonary disease (HCC)     Hypercholesteremia     Insomnia     Sleep disorder      No past surgical history on file.   No family history on file.  Social History     Tobacco Use    Smoking status: Former     Packs/day: 1.00     Types: Cigarettes     Quit date: 01/24/2019     Years since quitting: 2.3    Smokeless tobacco: Never   Substance Use Topics    Alcohol use: Yes     Alcohol/week: 1.0 standard drink          Allergies   Allergen Reactions    Cheese Nausea And Vomiting     Severe vomiting       Review of Systems:  Positive for fatigue. Otherwise a complete review of systems was negative except as noted above in HPI.     Objective:     Vitals:    05/30/21 0321 05/30/21 0830 05/30/21 1230 05/30/21 1545   BP: 106/67 102/62 104/60 116/68   Pulse: 77 91  (!) 102   Resp: 16 16     Temp: 97.9 F (36.6 C) 97.8 F (36.6 C)  98 F (36.7 C)   TempSrc: Oral Oral  Oral   SpO2:       Weight:       Height:            Physical Exam:  General: NAD, pleasant and cooperative   HEENT: anicteric, moist oral mucosa   CV: RRR, no  murmurs, 2+ pulses   Respiratory: clear and aerating well, no increased WOB  Abdomen: nondistended, nontender  Extremities: no peripheral edema   Musculoskeletal: moving extremities at least antigravity, L DROM, dressing in place with pico drain  Neuro: alert, normal speech, CN 2-12 intact, sensation intact to light touch     Labs:  No results found for this or any previous visit (from the past 24 hour(s)).          Impression/Plan:     Diagnoses:     Left femur fx, s/p hemiarthroplasty  Recurrent falls  COPD  HTN  HLD  Osteopenia    This patient would benefit from participation in an intensive inpatient rehabilitation program. Patient needs ongoing strengthening and safety education to return home safely.     This patient  can likely tolerate 3 hours of therapy per day.     Discussed with patient.    Barriers to rehab: None    [  x] Will need submission for insurance authorization for inpatient rehab.     Continue PT/OT in the acute setting. Will continue to follow.       Signed By: Carollee Leitz, APRN - NP     May 30, 2021    Physical Medicine and Rehabilitation

## 2021-05-30 NOTE — Progress Notes (Signed)
OCCUPATIONAL THERAPY TREATMENT  Patient: [name] (76 y.o. female)  Date: 05/30/2021  Primary Diagnosis: Femur fracture ([place] Neck) [S72.90XA]       Precautions: Posterior hip precautions                 In place during session: EKG/telemetry   Chart, occupational therapy assessment, plan of care, and goals were reviewed.    ASSESSMENT  Patient continues with skilled OT services and is progressing towards goals. Pt semi supine in bed upon OT arrival, agreeable to session. Pt A&O x 4. (See below for objective details and assist levels). Overall pt tolerated session fair today with completion of bed mobility, transfers, and grooming.  Pt completed bed mobility with less assist this date and able to complete transfers from EOB with [place] x2.  During first attempt for sit>stand pt noted with posterior lean against bed and required to return to seated, but able to come more upright with less posterior lean during second trial.  Pt ambulated in room with MinA x2 in preparation for household mobility.  Pt completed seated grooming in recliner with setup/SBA overall.  Reviewed UE HEP with pt and pt verbalized understanding.  Pt left seated in recliner with call bell within reach, breakfast tray set up in front of her, and needs met. Will continue to benefit from skilled OT services, and will continue to progress as tolerated.     Other factors to consider for discharge: poor safety awareness, high risk for falls, and concern for safely navigating or managing the home environment        PLAN :  Patient continues to benefit from skilled intervention to address the above impairments.  Continue treatment per established plan of care to address goals.    Recommend with staff: Out of bed to chair for meals, Encourage HEP in prep for ADLs/mobility, and use of BSC for toileting    Recommend next OT session: LB dressing, Seated grooming, and standing grooming    Recommendation for discharge: (in order for the patient to meet his/her  long term goals): Inpatient Rehabilitation Facility     IF patient discharges home will need the following DME: TBD       SUBJECTIVE:   Patient stated "I don't know why I haven't gotten breakfast yet.  I normally eat breakfast at 6:30 in the morning."    OBJECTIVE DATA SUMMARY:   Cognitive/Behavioral Status:  Orientation  Orientation Level: Oriented X4  Cognition  Overall Cognitive Status: Exceptions  Arousal/Alertness: Appropriate responses to stimuli  Safety Judgement: Decreased awareness of need for safety;Decreased awareness of need for assistance  Sequencing: Requires cues for all    Functional Mobility and Transfers for ADLs:  Bed Mobility:  Bed Mobility Training  Interventions: Verbal cues  Supine to Sit: Minimum assistance  Scooting: Minimum assistance     Transfers:   Transfer Training  Transfer Training: Yes  Overall Level of Assistance: Moderate assistance, Assist X2, Additional time  Interventions: Demonstration, Verbal cues  Sit to Stand: Moderate assistance, Assist X2  Stand to Sit: Moderate assistance, Assist X2  Bed to Chair: Minimum assistance, Assist X2  Toilet Transfer: Minimum assistance, Assist X2, Additional time    Balance:  Standing: Impaired  Balance  Sitting: Intact (with use of railing)  Standing: Impaired  Standing - Static: Poor  Standing - Dynamic: Poor    ADL Intervention:  Grooming: Setup;Stand by assistance   Face washing: Setup;Stand by assistance  Hair brushing: Setup;Stand by assistance  Pain Rating:  2-3/10 L hip pain  Pain Intervention(s):   patient medicated for pain prior to session, repositioning, and pain is at a level acceptable to the patient    Activity Tolerance:   Fair  and requires rest breaks    After treatment patient left in no apparent distress:   Bed locked and returned to lowest position, Patient left in no apparent distress sitting up in chair and Call bell within reach, and nsg updated     COMMUNICATION/EDUCATION:   The patient's plan of care was discussed  with: Physical therapy assistant, Registered nurse, and Ortho PA    Patient Education  Education Given To: Patient  Education Provided: Role of Therapy;Home Exercise Program;Precautions;Transfer Training  Education Method: Verbal;Teach Back;Demonstration  Barriers to Learning: None  Education Outcome: Verbalized understanding;Continued education needed    Thank you for this referral.  Jezebelle Ledwell, OTA  Minutes: 39    PT/OT sessions occurred together for increased safety of pt and clinician.    Problem: Occupational Therapy - Adult  Goal: By Discharge: Performs self-care activities at highest level of function for planned discharge setting.  See evaluation for individualized goals.  Description: Description: FUNCTIONAL STATUS PRIOR TO ADMISSION: Pt states she was ind with ADLs with no AD PTA.     HOME SUPPORT: Lives alone    Occupational Therapy Goals  Initiated 05/27/2021    Pt stated goal: To return to PLOF  Pt will be Mod I sup <> sit in prep for EOB ADLs  Pt will be Mod I grooming standing sink side LRAD  Pt will be Mod I UB dressing sitting EOB/long sit   Pt will be Mod I LE dressing sitting EOB/long sit  Pt will be Mod I sit <> stand in prep for toileting LRAD  Pt will be Mod I toileting/toilet transfer/cloth mgmt LRAD    Outcome: Progressing

## 2021-05-30 NOTE — Other (Signed)
Bedside and Verbal shift change report given to Debby (oncoming nurse) by [name] (offgoing nurse). Report included the following information Nurse Handoff Report, Intake/Output, MAR, Recent Results, and Med Rec Status.

## 2021-05-30 NOTE — Care Coordination-Inpatient (Signed)
CM reviewed chart. Patient's discharge dispo is [facility]. Josem Kaufmann is pending for [employer]. Auth ref #: [id number].

## 2021-05-31 MED FILL — CEFAZOLIN SODIUM 1 G IJ SOLR: 1 g | INTRAMUSCULAR | Qty: 1000

## 2021-05-31 MED FILL — ASPIRIN LOW STRENGTH 81 MG PO CHEW: 81 MG | ORAL | Qty: 1

## 2021-05-31 MED FILL — ATORVASTATIN CALCIUM 40 MG PO TABS: 40 MG | ORAL | Qty: 1

## 2021-05-31 MED FILL — STIMULANT LAXATIVE 8.6-50 MG PO TABS: ORAL | Qty: 1

## 2021-05-31 MED FILL — METOPROLOL SUCCINATE ER 25 MG PO TB24: 25 MG | ORAL | Qty: 1

## 2021-05-31 MED FILL — PANTOPRAZOLE SODIUM 40 MG PO TBEC: 40 MG | ORAL | Qty: 1

## 2021-05-31 MED FILL — ACETAMINOPHEN EXTRA STRENGTH 500 MG PO TABS: 500 MG | ORAL | Qty: 2

## 2021-05-31 MED FILL — CELECOXIB 100 MG PO CAPS: 100 MG | ORAL | Qty: 1

## 2021-05-31 MED FILL — PEG 3350 17 G PO PACK: 17 g | ORAL | Qty: 1

## 2021-05-31 NOTE — Progress Notes (Signed)
Orthopedic progress note    Date:05/31/2021       Room:439/01  Patient Name:[name]     Date of Birth:February 17, 1945     Age:76 y.o.      Subjective    Status post removal hardware/ORIF revision left hemiarthroplasty.  Postop day #5.  Patient continues to do well.  She still complains of moderate pain of her left hip particularly with standing.  She does not feel pain medication and strong enough.  Progressing better with therapy.  No other complaints at this time.  Objective           Vitals Last 24 Hours:  TEMPERATURE:  Temp  Avg: 97.9 F (36.6 C)  Min: 97.8 F (36.6 C)  Max: 98.1 F (36.7 C)  RESPIRATIONS RANGE: Resp  Avg: 18  Min: 16  Max: 21  PULSE OXIMETRY RANGE: SpO2  Avg: 95 %  Min: 94 %  Max: 96 %  PULSE RANGE: Pulse  Avg: 92.4  Min: 84  Max: 104  BLOOD PRESSURE RANGE: Systolic (24hrs), Avg:112 , Min:102 , Max:121   ; Diastolic (24hrs), Avg:68, Min:60, Max:75    Current Facility-Administered Medications   Medication Dose Route Frequency    melatonin tablet 5 mg  5 mg Oral Nightly PRN    acetaminophen (TYLENOL) tablet 1,000 mg  1,000 mg Oral Q6H    albuterol sulfate HFA (PROVENTIL;VENTOLIN;PROAIR) 108 (90 Base) MCG/ACT inhaler 2 puff  2 puff Inhalation Q4H PRN    aspirin chewable tablet 81 mg  81 mg Oral BID    atorvastatin (LIPITOR) tablet 40 mg  40 mg Oral Nightly    budesonide-formoterol (SYMBICORT) 160-4.5 MCG/ACT inhaler 2 puff  2 puff Inhalation BID    And    tiotropium (SPIRIVA RESPIMAT) 2.5 MCG/ACT inhaler 2 puff  2 puff Inhalation Daily    ceFAZolin (ANCEF) 2,000 mg in sterile water 20 mL IV syringe  2,000 mg IntraVENous Q8H    celecoxib (CELEBREX) capsule 100 mg  100 mg Oral BID    metoprolol succinate (TOPROL XL) extended release tablet 25 mg  25 mg Oral Daily    ondansetron (ZOFRAN-ODT) disintegrating tablet 4 mg  4 mg Oral Q6H PRN    Or    ondansetron (ZOFRAN) injection 4 mg  4 mg IntraVENous Q6H PRN    pantoprazole (PROTONIX) tablet 40 mg  40 mg Oral QAM AC    polyethylene glycol (GLYCOLAX)  packet 17 g  17 g Oral Daily    sennosides-docusate sodium (SENOKOT-S) 8.6-50 MG tablet 1 tablet  1 tablet Oral BID    sodium chloride flush 0.9 % injection 5-40 mL  5-40 mL IntraVENous 2 times per day    sodium chloride flush 0.9 % injection 5-40 mL  5-40 mL IntraVENous PRN    0.9 % sodium chloride infusion   IntraVENous PRN    traMADol (ULTRAM) tablet 50 mg  50 mg Oral Q6H PRN          I/O (24Hr):    Intake/Output Summary (Last 24 hours) at 05/31/2021 0814  Last data filed at 05/30/2021 0830  Gross per 24 hour   Intake 500 ml   Output --   Net 500 ml     Objective:  Vital signs: (most recent): Blood pressure 121/72, pulse 90, temperature 97.8 F (36.6 C), temperature source Oral, resp. rate 18, height 5\' 3"  (1.6 m), weight 112 lb 7 oz (51 kg), SpO2 94 %.    Labs/Imaging/Diagnostics    Labs:  CBC:No results  for input(s): WBC, RBC, HGB, HCT, MCV, RDW, PLT in the last 72 hours.  CHEMISTRIES:No results for input(s): NA, K, CL, CO2, BUN, CREA, GLUCOSE, CA, PHOS, MG in the last 72 hours.PT/INR:No results for input(s): PROTIME, INR in the last 72 hours.  APTT:No results for input(s): APTT in the last 72 hours.  LIVER PROFILE:No results for input(s): AST, ALT, BILIDIR, BILITOT, ALKPHOS in the last 72 hours.  Lab Results   Component Value Date/Time    ALT 16 01/28/2021 08:50 AM    AST 22 01/28/2021 08:50 AM       Physical Exam:  Left hip: Aquacel Ag dressing remains clean dry and intact.  No swelling seen left thigh.  No tenderness to palpation of the left thigh.  No calf pain.  EHL/DF/PF is 4 out of 5.  Left lower extremity neurovascularly intact.    Assessment//Plan           Patient Active Problem List    Diagnosis Date Noted    Femur fracture (HCC) 05/24/2021    Closed fracture of neck of left femur (HCC) 01/26/2021    Hip fracture (HCC) 01/25/2021    Sepsis (HCC) 01/02/2021    Cardiac syncope 11/27/2020    Ambulatory dysfunction 11/27/2020     Status post removal hardware/ORIF revision left hip hemiarthroplasty.  Postop  day #5.  Continue with therapy as tolerated.  Continue with aspirin for DVT prophylaxis.  Will increase tramadol to 50 to 100 mg every 6 hours as needed pain.  Awaiting rehab placement.  Okay to discharge from orthopedics when cleared by medicine and placement has been made.  Patient will follow-up with Dr. Gerda Diss as outpatient on Jun 10, 2021      Electronically signed by Tommi Rumps, PA-C on 05/31/2021 at 8:14 AM

## 2021-05-31 NOTE — Progress Notes (Signed)
[facility] Hospitalist Progress Note  Kimball Manske Dena BilletAkbar Taiesha Bovard MD    Date:05/31/2021       Room:439/01  Patient Name:[name]     Date of Birth:Apr 28, 1945     Age:76 y.o.    12/01/20 echocardiogram    Left Ventricle: Low normal left ventricular systolic function with a visually estimated EF of 50 - 55%. Left ventricle is smaller than normal. Mild septal thickening. Mild posterior thickening. See diagram for wall motion findings. Grade I diastolic dysfunction with normal LAP.    Right Ventricle: Reduced systolic function.    Aortic Valve: Tricuspid valve. Mild sclerosis of the aortic valve cusp. Mild regurgitation.    Mitral Valve: Valve structure is normal. Mild annular calcification at the posterior leaflet of the mitral valve. Mild to moderate paravalvular regurgitation.    Right Atrium: Right atrium is mildly dilated.    Technical qualifiers: Echo study was technically difficult due to patient's body habitus.    05/24/21 admission course  [name] is a 76 y.o. female with PMH of COPD, HLP and recent fall with left femur fracture s/p ORIF of the left hip. She presented to the ED with chief complaint of fall and left hip pain. She reports mechanical fall when walking at home today,  was not using walker. She denies LOC, lightheadedness or vertigo prior to fall. No focal neurology symptoms. She report left hip pain, severe in nature, worsened with movement.     In the ED, noted hypertensive. X-ray showed acute fracture of the left femoral greater and lesser trochanters.    Brief Postoperative Note   Patient: [name]  Date of Birth: Apr 28, 1945  MRN: [id number]   Date of Procedure: 05/26/2021    Pre-Op Diagnosis: Left hip periprosthetic fracture with loose stem   Post-Op Diagnosis: Same as above   Procedure(s):  LEFT HIP REVISION POSTERIOR APPROACH WITH ALLOGRAFT   Surgeon(s):  Helene KelpGrenier, Eric, MD   Surgical Assistant: Surg Asst-1: Jalene MulletFellone, Jacob; Larene PickettLacy, Terry Robert   Anesthesia: General    Estimated Blood Loss (mL): 200    Complications: None    05/31/21 orthopedics  Status post removal hardware/ORIF revision left hip hemiarthroplasty.  Postop day #5.  Continue with therapy as tolerated.  Continue with aspirin for DVT prophylaxis.  Will increase tramadol to 50 to 100 mg every 6 hours as needed pain.  Awaiting rehab placement.  Okay to discharge from orthopedics when cleared by medicine and placement has been made.  Patient will follow-up with Dr. Gerda DissGrenier as outpatient on Jun 10, 2021    05/31/21 no new complaints, tolerating medications well  Postop course progressing well  Rehab placement anticipated    Subjective    Subjective:  Symptoms:  Stable.     Review of Systems   All other systems reviewed and are negative.  Objective         Vitals Last 24 Hours:  TEMPERATURE:  Temp  Avg: 97.9 F (36.6 C)  Min: 97.8 F (36.6 C)  Max: 98.1 F (36.7 C)  RESPIRATIONS RANGE: Resp  Avg: 18  Min: 16  Max: 21  PULSE OXIMETRY RANGE: SpO2  Avg: 94.7 %  Min: 94 %  Max: 96 %  PULSE RANGE: Pulse  Avg: 93.3  Min: 84  Max: 104  BLOOD PRESSURE RANGE: Systolic (24hrs), Avg:114 , Min:104 , Max:121   ; Diastolic (24hrs), Avg:70, Min:60, Max:78    I/O (24Hr):    Intake/Output Summary (Last 24 hours) at 05/31/2021 1026  Last data filed at  05/31/2021 0840  Gross per 24 hour   Intake 450 ml   Output --   Net 450 ml     Vitals:    05/31/21 0300 05/31/21 0759 05/31/21 0801 05/31/21 0830   BP: 121/72   118/78   Pulse: 84 90 90 100   Resp: Temp: 97.8 F (36.6 C)   97.9 F (36.6 C)   TempSrc: Oral   Oral   SpO2:  94% 94%    Weight:       Height:          Objective:  General Appearance:  Comfortable.    Vital signs: (most recent): Blood pressure 118/78, pulse 100, temperature 97.9 F (36.6 C), temperature source Oral, resp. rate 16, height  (1.6 m), weight 112 lb 7 oz (51 kg), SpO2 94 %.    General: NAD, pleasant and cooperative   HEENT: anicteric, moist oral mucosa   CV: RRR, no murmurs, 2+ pulses   Respiratory: clear and aerating well, no  increased WOB  Abdomen: nondistended, nontender  Extremities: no peripheral edema   Musculoskeletal: moving extremities at least antigravity, L DROM, dressing in place with pico drain  Neuro: alert, normal speech, CN 2-12 intact, sensation intact to light touch     Labs/Imaging/Diagnostics    Labs:  CBC:No results for input(s): WBC, RBC, HGB, HCT, MCV, RDW, PLT in the last 72 hours.  CHEMISTRIES:No results for input(s): NA, K, CL, CO2, BUN, CREATININE, GLUCOSE, CA, PHOS, MG in the last 72 hours.  PT/INR:No results for input(s): PROTIME, INR in the last 72 hours.  APTT:No results for input(s): APTT in the last 72 hours.  LIVER PROFILE:No results for input(s): AST, ALT, BILIDIR, BILITOT, ALKPHOS in the last 72 hours.    Imaging Last 24 Hours:  No results found.  Assessment//Plan           Recent Results (from the past 200 hour(s))   Basic Metabolic Panel    Collection Time: 05/24/21  5:49 PM   Result Value Ref Range    Sodium 135 (L) 136 - 145 mmol/L    Potassium 5.0 3.5 - 5.1 mmol/L    Chloride 105 97 - 108 mmol/L    CO2 23 21 - 32 mmol/L    Anion Gap 7 5 - 15 mmol/L    Glucose 124 (H) 65 - 100 mg/dL    BUN 13 6 - 20 mg/dL    Creatinine 2.95 6.21 - 1.02 mg/dL    Bun/Cre Ratio 15 12 - 20      ESTIMATED GLOMERULAR FILTRATION RATE >60 >60 ml/min/1.76m2    Calcium 9.5 8.5 - 10.1 mg/dL   CBC with Auto Differential    Collection Time: 05/24/21  6:44 PM   Result Value Ref Range    WBC 12.5 (H) 3.6 - 11.0 K/uL    RBC 4.32 3.80 - 5.20 M/uL    Hemoglobin 12.6 11.5 - 16.0 g/dL    Hematocrit 30.8 65.7 - 47.0 %    MCV 90.3 80.0 - 99.0 FL    MCH 29.2 26.0 - 34.0 PG    MCHC 32.3 30.0 - 36.5 g/dL    RDW 84.6 (H) 96.2 - 14.5 %    Platelets 282 150 - 400 K/uL    MPV 8.7 (L) 8.9 - 12.9 FL    Nucleated RBCs 0.0 0.0 PER 100 WBC    NRBC Absolute 0.00 0.00 - 0.01 K/uL    Neutrophils % 79 (  H) 32 - 75 %    Lymphocytes % 13 12 - 49 %    Monocytes % 6 5 - 13 %    Eosinophils % 1 0 - 7 %    Basophils % 0 0 - 1 %    Immature Granulocytes 1 (H) 0  - 0.5 %    Neutrophils Absolute 9.8 (H) 1.8 - 8.0 K/UL    Lymphocytes Absolute 1.6 0.8 - 3.5 K/UL    Monocytes Absolute 0.8 0.0 - 1.0 K/UL    Eosinophils Absolute 0.2 0.0 - 0.4 K/UL    Basophils Absolute 0.1 0.0 - 0.1 K/UL    Granulocyte Absolute Count 0.1 (H) 0.00 - 0.04 K/UL    Differential Type AUTOMATED     Troponin, High Sensitivity    Collection Time: 05/24/21  6:51 PM   Result Value Ref Range    Troponin, High Sensitivity 6 0 - 51 ng/L   Protime-INR    Collection Time: 05/24/21  6:51 PM   Result Value Ref Range    Protime 13.4 11.9 - 14.6 sec    INR 1.0 0.9 - 1.1     APTT    Collection Time: 05/24/21  6:51 PM   Result Value Ref Range    aPTT 27.4 21.2 - 34.1 sec    Therapeutic Range   82 - 109 sec   Vitamin D 25 Hydroxy    Collection Time: 05/25/21  5:06 AM   Result Value Ref Range    Vit D, 25-Hydroxy 18.0 (L) 30 - 100 ng/mL   Basic Metabolic Panel    Collection Time: 05/25/21  5:06 AM   Result Value Ref Range    Sodium 134 (L) 136 - 145 mmol/L    Potassium 4.1 3.5 - 5.1 mmol/L    Chloride 104 97 - 108 mmol/L    CO2 27 21 - 32 mmol/L    Anion Gap 3 (L) 5 - 15 mmol/L    Glucose 111 (H) 65 - 100 mg/dL    BUN 16 6 - 20 mg/dL    Creatinine 7.40 8.14 - 1.02 mg/dL    Bun/Cre Ratio 17 12 - 20      ESTIMATED GLOMERULAR FILTRATION RATE >60 >60 ml/min/1.57m2    Calcium 9.1 8.5 - 10.1 mg/dL   CBC    Collection Time: 05/25/21  5:06 AM   Result Value Ref Range    WBC 12.1 (H) 3.6 - 11.0 K/uL    RBC 4.37 3.80 - 5.20 M/uL    Hemoglobin 12.7 11.5 - 16.0 g/dL    Hematocrit 48.1 85.6 - 47.0 %    MCV 90.6 80.0 - 99.0 FL    MCH 29.1 26.0 - 34.0 PG    MCHC 32.1 30.0 - 36.5 g/dL    RDW 31.4 (H) 97.0 - 14.5 %    Platelets 280 150 - 400 K/uL    MPV 8.5 (L) 8.9 - 12.9 FL    Nucleated RBCs 0.0 0.0 PER 100 WBC    NRBC Absolute 0.00 0.00 - 0.01 K/uL   EKG 12 Lead    Collection Time: 05/25/21  8:18 AM   Result Value Ref Range    Ventricular Rate 121 BPM    Atrial Rate 121 BPM    P-R Interval 158 ms    QRS Duration 64 ms    Q-T Interval  302 ms    QTc Calculation (Bazett) 428 ms    P Axis 48 degrees    R Axis -  11 degrees    T Axis 51 degrees    Diagnosis       Sinus tachycardia  Cannot rule out Anterior infarct , age undetermined  Abnormal ECG  No previous ECGs available  Confirmed by Nelson Chimes ([id number]) on 05/25/2021 8:18:33 AM     Culture, Urine    Collection Time: 05/26/21 12:15 PM    Specimen: Urine   Result Value Ref Range    Special Requests No Special Requests      Culture No Growth (<1000 cfu/mL)     Type and Screen    Collection Time: 05/26/21  6:16 PM   Result Value Ref Range    Crossmatch expiration date 05/29/2021,2359     ABO/Rh O Positive     Antibody Screen Negative     XR PELVIS (1-2 VIEWS)    Result Date: 05/26/2021  EXAM: XR PELV AP ONLY INDICATION: . COMPARISON: None. FINDINGS: An AP view  of the pelvis demonstrates no fracture, lytic or blastic lesion or other abnormality. The soft tissues are normal.     Normal pelvis.    XR PELVIS (1-2 VIEWS)    Result Date: 05/24/2021  EXAM: XR PELV AP ONLY, XR FEMUR LT 2 V INDICATION: Pain. Unable to stand.. COMPARISON: Radiographs 01/26/2021. FINDINGS: An AP view  of the pelvis and 4 views of the left femur demonstrates moderate diffuse osteopenia. There is a left femoral bipolar hemiarthroplasty with noncemented femoral component. There is interval subsidence of the femoral component measuring approximately 15 mm. There are fractures of the greater and lesser trochanters of the left femur. No other femoral fracture is shown. No innominate bone fracture is demonstrated. 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 Extensive atherosclerotic calcifications are noted.     Marked diffuse osteopenia. Left femoral bipolar hemiarthroplasty with interval subsidence measuring 15 mm. Acute appearing fractures of left femoral greater and lesser trochanters.    XR FEMUR LEFT (MIN 2 VIEWS)    Result Date: 05/24/2021  EXAM: XR PELV AP ONLY, XR FEMUR LT 2 V INDICATION:  Pain. Unable to stand.. COMPARISON: Radiographs 01/26/2021. FINDINGS: An AP view  of the pelvis and 4 views of the left femur demonstrates moderate diffuse osteopenia. There is a left femoral bipolar hemiarthroplasty with noncemented femoral component. There is interval subsidence of the femoral component measuring approximately 15 mm. There are fractures of the greater and lesser trochanters of the left femur. No other femoral fracture is shown. No innominate bone fracture is demonstrated. 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 Extensive atherosclerotic calcifications are noted.     Marked diffuse osteopenia. Left femoral bipolar hemiarthroplasty with interval subsidence measuring 15 mm. Acute appearing fractures of left femoral greater and lesser trochanters.    XR KNEE LEFT (3 VIEWS)    Result Date: 05/24/2021  INDICATION:  fall Exam: AP, lateral, oblique views of the left knee. FINDINGS: There is no acute fracture-dislocation. There is marked narrowing of the medial lateral tibiofemoral joint spaces. The osseous structures are diffusely demineralized. No suprapatellar joint fluid is visualized.     No acute fracture or dislocation.      XR CHEST PORTABLE    Result Date: 05/24/2021  EXAM:  XR CHEST PORT INDICATION: Generalized lung crackles COMPARISON: 01/02/2021, CT 01/04/2021 TECHNIQUE: 1922 hours portable chest AP view FINDINGS: Mediastinal and hilar contours are stable with mild thoracic aortic tortuosity again shown. The cardiac silhouette is within normal limits. The pulmonary vasculature  is within normal limits. The lungs and pleural spaces are clear. Bones are severely osteopenic.     No acute process on portable chest.     CT Lower Extremity Left WO Contrast    Result Date: 05/25/2021  EXAM: CT LOW EXT LT WO CONT INDICATION: Fall. Fracture. COMPARISON: Radiographs same day TECHNIQUE: Helical CT of the left femur with coronal and sagittal reformats. Images reviewed  in soft tissue and bone windows.  CT dose reduction was achieved through the use of a standardized protocol tailored for this examination and automatic exposure control for dose modulation. CONTRAST: None. FINDINGS: Bones: The bones are osteopenic. The patient is status post left hip hemiarthroplasty. No dislocation. There is a periprosthetic fracture in the proximal left femur in the intertrochanteric region. There is mild subsidence. Joint fluid: No significant joint effusion. Articulations: No significant osteoarthritis. No evidence of inflammatory arthritis. Tendons: No full-thickness tendon tear. Muscles: No intramuscular hematoma. No focal atrophy. Soft tissue mass: No mass. There is soft tissue hematoma in the musculature surrounding the left hip.     Status post left hip hemiarthroplasty. There is an acute periprosthetic fracture in the intertrochanteric region with mild subsidence.    XR HIP 2-3 VW W PELVIS LEFT    Result Date: 05/26/2021  EXAM: XR HIP LT W OR WO PELV 2-3 VWS INDICATION: hip revision left. COMPARISON: Prior day. FINDINGS: AP view of the pelvis and a frogleg lateral view of the left hip demonstrate a left hip replacement in progress. There is gas and fluid in the adjacent soft tissues. Head of the hip prosthesis is currently absent.     Left hip replacement revision in progress.    Assessment & Plan      12/01/20 echocardiogram    Left Ventricle: Low normal left ventricular systolic function with a visually estimated EF of 50 - 55%. Left ventricle is smaller than normal. Mild septal thickening. Mild posterior thickening. See diagram for wall motion findings. Grade I diastolic dysfunction with normal LAP.    Right Ventricle: Reduced systolic function.    Aortic Valve: Tricuspid valve. Mild sclerosis of the aortic valve cusp. Mild regurgitation.    Mitral Valve: Valve structure is normal. Mild annular calcification at the posterior leaflet of the mitral valve. Mild to moderate paravalvular  regurgitation.    Right Atrium: Right atrium is mildly dilated.    Technical qualifiers: Echo study was technically difficult due to patient's body habitus.    05/24/21 admission course  [name] is a 76 y.o. female with PMH of COPD, HLP and recent fall with left femur fracture s/p ORIF of the left hip. She presented to the ED with chief complaint of fall and left hip pain. She reports mechanical fall when walking at home today,  was not using walker. She denies LOC, lightheadedness or vertigo prior to fall. No focal neurology symptoms. She report left hip pain, severe in nature, worsened with movement.     In the ED, noted hypertensive. X-ray showed acute fracture of the left femoral greater and lesser trochanters.    Brief Postoperative Note   Patient: [name]  Date of Birth: 25-Sep-1945  MRN: [id number]   Date of Procedure: 05/26/2021    Pre-Op Diagnosis: Left hip periprosthetic fracture with loose stem   Post-Op Diagnosis: Same as above   Procedure(s):  LEFT HIP REVISION POSTERIOR APPROACH WITH ALLOGRAFT   Surgeon(s):  Helene Kelp, MD   Surgical Assistant: Surg Asst-1: Jalene Mullet; Larene Pickett  Anesthesia: General    Estimated Blood Loss (mL): 200   Complications: None    05/31/21 orthopedics  Status post removal hardware/ORIF revision left hip hemiarthroplasty.  Postop day #5.  Continue with therapy as tolerated.  Continue with aspirin for DVT prophylaxis.  Will increase tramadol to 50 to 100 mg every 6 hours as needed pain.  Awaiting rehab placement.  Okay to discharge from orthopedics when cleared by medicine and placement has been made.  Patient will follow-up with Dr. Gerda Diss as outpatient on Jun 10, 2021    05/31/21 no new complaints, tolerating medications well  Postop course progressing well  Rehab placement anticipated    MICROBIOLOGY    05/26/21  Urine  Negative    ASSESSMENT AND PLAN    1) Left femur acute fracture s/p operative management as above     Cleared per orthopedics   Physical  therapy   Awaiting rehab placement   Outpatient follow up with orthopedics 06/10/21    2) Cardiovascular issues including hypertension and dyslipidemia     Echocardiogram as above   Medical management    Aspirin    Atorvastatin    Metoprolol    3) COPD not in acute exacerbation    4) Dispo IRF anticipated      Electronically signed by Zunaira Lamy Dena Billet, MD on 05/31/21 at 10:26 AM EDT

## 2021-05-31 NOTE — Plan of Care (Signed)
OCCUPATIONAL THERAPY TREATMENT  Patient: [name] (76 y.o. female)  Date: 05/31/2021  Primary Diagnosis: Femur fracture (HCC) [S72.90XA]       Precautions:               Hip Precautions: Posterior hip precautions  In place during session: EKG/telemetry   Chart, occupational therapy assessment, plan of care, and goals were reviewed.    ASSESSMENT  Patient continues with skilled OT services and is progressing towards goals.  Pt received semi-supine in bed upon arrival, AXO x4 and agreeable to COTA/PTA tx at this time. Pt cooperative and demonstrated good effort during activities. Pt tolerated therapy session fairly with c/o pain in L hip with movement. Pt demonstrated improved bed mobility>EOB with decreased assistance with vc's for proper technique. Pt req'd re-education on hip precautions as pt unable to re-call. Pt given handouts on hip precautions. Pt presented intact sitting balance. Pt overall req'd min A x2 for all transfers using RW/gait belt, requiring verbal/demonstration/tactile cues for hand placement, posture, posterior lean and RW mgmt for safety. Pt tends to be anxious and needs encouragement. Pt presented increased ambulation in room in prep for household mobility. Pt set up for grooming in chair.  Patient educated on and completed bilateral UE HEP to increase strength/endurance needed for ADL'S and functional transfers with vc's to achieve full ROM, see grid below.  Overall, pt continues to present with deficits in generalized strength/AROM, static/dynamic standing balance, pain , hip precautions and functional activity tolerance during performance of ADLs/mobility (see below for objective details and assist levels).  Will continue to progress. Recommend d/c to IRF once medically appropriate.       Other factors to consider for discharge:   Time of onset, medical prognosis/diagnosis, severity of deficits, PLOF, functional baseline, home environment, and family support      PLAN :  Patient continues  to benefit from skilled intervention to address the above impairments.  Continue treatment per established plan of care to address goals.    Recommend with staff: Out of bed to chair for meals and Encourage HEP in prep for ADLs/mobility    Recommend next OT session: Toileting    Recommendation for discharge: (in order for the patient to meet his/her long term goals): Inpatient Rehabilitation Facility     IF patient discharges home will need the following DME: TBD       SUBJECTIVE:   Patient stated "I can't remember."    OBJECTIVE DATA SUMMARY:   Cognitive/Behavioral Status:  Orientation  Orientation Level: Oriented X4  Cognition  Overall Cognitive Status: WNL  Memory: Decreased recall of precautions    Functional Mobility and Transfers for ADLs:  Bed Mobility:  Bed Mobility Training: Yes  Interventions: Demonstration, Verbal cues, Weight shifting training/pressure relief, Safety awareness training, Tactile cues  Supine to Sit: Stand-by assistance  Scooting: Stand-by assistance     Transfers:   Transfer Training: Yes  Overall Level of Assistance: Moderate assistance, Assist X2, Additional time  Interventions: Demonstration, Safety awareness training, Tactile cues, Verbal cues, Weight shifting training/pressure relief  Sit to Stand: Minimum assistance, Assist X2  Stand to Sit: Minimum assistance, Assist X2  Bed to Chair: Minimum assistance, Assist X2      Balance:  Standing: Impaired  Balance  Sitting: Intact  Standing: Impaired  Standing - Static: Fair, Constant support  Standing - Dynamic: Fair, Constant support      ADL Intervention:     Grooming: Setup   Grooming Skilled Clinical Factors: Face and  hair       Therapeutic exercise:    Exercise Sets Reps AROM AAROM PROM Self PROM Comments   Elbow E/F 1 12 [x]  []  []  []  Chair level   Chest press 1 12 [x]   []  []     Ceiling punches 1 12 [x]             Pain Rating:  0/10 at rest and 9/10 with movement in L hip  Pain Intervention(s):   nursing notified, rest, and  elevation    Activity Tolerance:   Fair  and requires frequent rest breaks    After treatment patient left in no apparent distress:   Bed locked and returned to lowest position, Patient left in no apparent distress sitting up in chair and Call bell within reach, and nsg updated     COMMUNICATION/EDUCATION:   The patient's plan of care was discussed with: Physical therapy assistant and Registered nurse Co-tx with PTA for increased pt/clinician safety with OOB activity          Thank you for this referral.  , OTA  Minutes: 39   Problem: Occupational Therapy - Adult  Goal: By Discharge: Performs self-care activities at highest level of function for planned discharge setting.  See evaluation for individualized goals.  Description: Description: FUNCTIONAL STATUS PRIOR TO ADMISSION: Pt states she was ind with ADLs with no AD PTA.     HOME SUPPORT: Lives alone    Occupational Therapy Goals  Initiated 05/27/2021    Pt stated goal: To return to PLOF  Pt will be Mod I sup <> sit in prep for EOB ADLs  Pt will be Mod I grooming standing sink side LRAD  Pt will be Mod I UB dressing sitting EOB/long sit   Pt will be Mod I LE dressing sitting EOB/long sit  Pt will be Mod I sit <> stand in prep for toileting LRAD  Pt will be Mod I toileting/toilet transfer/cloth mgmt LRAD    Outcome: Progressing

## 2021-05-31 NOTE — Plan of Care (Signed)
Problem: Physical Therapy - Adult  Goal: By Discharge: Performs mobility at highest level of function for planned discharge setting.  See evaluation for individualized goals.  Description: Problem: Mobility Impaired (Adult and Pediatric)  Goal: *Acute Goals and Plan of Care (Insert Text)  Description: FUNCTIONAL STATUS PRIOR TO ADMISSION: Patient was independent and active without use of DME.    HOME SUPPORT PRIOR TO ADMISSION: The patient lived alone with no local support.    Physical Therapy Goals  Initiated 05/27/2021  Patient/family stated goal: to get stronger and walk   1. Patient will move from supine to sit and sit to supine in bed with supervision/set-up within 7 day(s).   2. Patient will transfer from bed to chair and chair to bed with supervision/set-up using the least restrictive device within 7 day(s).  3. Patient will perform sit to stand with supervision/set-up within 7 day(s).  4. Patient will ambulate with supervision/set-up for 25 feet with the least restrictive device within 7 day(s).   5. Patient will ascend/descend 4 stairs with B handrail(s) with supervision/set-up within 7 day(s).  6. Patient will participate in lower extremity therapeutic exercise/activities with supervision/set-up for 10 minutes within 7 day(s).     Outcome: Progressing            PHYSICAL THERAPY TREATMENT     Patient: [name] (76 y.o. female)  Date: 05/31/2021  Diagnosis: Femur fracture (HCC) [S72.90XA] <principal problem not specified>      Precautions:  Hip Precautions: Posterior hip precautions  In place during session: EKG/telemetry   Chart, physical therapy assessment, plan of care and goals were reviewed.    ASSESSMENT  Patient continues with skilled PT services and is progressing towards goals. Pt received semi supine upon PT/OT arrival, agreeable to session. Pt A&O x 4. And eager to get to the chair.(See below for objective details and assist levels).     Overall pt tolerated session fair today with Tfs and  ambulation. Pt. Required less assistance with bed mobility and Tfs today. Pt. S  gt. Remains unsteady but increased ambulation distance. Pt. Leans posteriorly during ambulation and requires Min assist of 1-2 to maintain balance.  Decreased step length/stride noted with ambulation. Pt. Required 1 seated rest break after 10' due to fatigue. Will continue to benefit from skilled PT services, and will continue to progress as tolerated.     Current Level of Function Impacting Discharge (mobility/balance): Min A X 2 for Mobility    Other factors to consider for discharge: poor safety awareness, high risk for falls, and not safe to be alone       PLAN :  Patient continues to benefit from skilled intervention to address impaired functional mobility, impaired strength, poor body mechanics, poor safety awareness, impaired balance, impaired posture. Continue treatment per established plan of care to address goals.    Recommend with staff: Out of bed to chair for meals, Encourage HEP in prep for ADLs/mobility, Use of bed/chair alarm for safety, and LE elevation for management of edema    Recommendation for discharge: (in order for the patient to meet his/her long term goals)  Inpatient Rehabilitation Facility     IF patient discharges home will need the following DME: TBD       SUBJECTIVE:   Patient stated "I am ready, glad you are here."    OBJECTIVE DATA SUMMARY:   Critical Behavior:  Orientation  Orientation Level: Oriented X4  Cognition  Overall Cognitive Status: WNL  Memory: Decreased recall of  precautions    Functional Mobility Training:  Bed Mobility:  Bed Mobility Training  Bed Mobility Training: Yes  Interventions: Demonstration;Verbal cues;Weight shifting training/pressure relief;Safety awareness training;Tactile cues  Supine to Sit: Stand-by assistance  Scooting: Stand-by assistance  Transfers:  Art therapist: Yes  Interventions: Demonstration;Safety awareness training;Tactile cues;Verbal  cues;Weight shifting training/pressure relief  Sit to Stand: Minimum assistance;Assist X2  Stand to Sit: Minimum assistance;Assist X2  Bed to Chair: Minimum assistance;Assist X2  Balance:  Balance  Sitting: Intact  Standing: Impaired  Standing - Static: Fair;Constant support  Standing - Dynamic: Fair;Constant support   Wheelchair Mobility:      Ambulation/Gait Training:     Gait  Interventions: Demonstration;Safety awareness training;Verbal cues;Weight shifting training/pressure relief  Base of Support: Narrowed  Speed/Cadence: Slow  Stance: Left decreased;Right decreased  Gait Abnormalities: Step to gait  Assistive Device: Walker, rolling;Gait belt      Therapeutic Exercises:       EXERCISE   Sets   Reps   Active Active Assist   Passive Self ROM   Comments   Ankle Pumps  20 [x]  []  []  []     Quad Sets/Glut Sets  20 [x]  []  []  []  L LE   Hamstring Sets   []  []  []  []     Short Arc Quads   []  []  []  []     Heel Slides 2 10 [x]  []  []  []  L LE   Straight Leg Raises   []  []  []  []     Hip abd/add 2 10 []  [x]  []  []  Mod assist   Long Arc Quads 2 5 [x]  []  []  []     Marching   []  []  []  []        []  []  []  []        Pain Rating:  0/10 at rest   9/10 with mobility  Pain Intervention(s):   nursing notified and addressing, elevation, and repositioning    Activity Tolerance:   Fair  and requires rest breaks    After treatment patient left in no apparent distress:   Bed locked and returned to lowest position, Patient left in no apparent distress sitting up in chair and Call bell within reach, and nsg updated       COMMUNICATION/COLLABORATION:   The patient's plan of care was discussed with: Occupational therapy assistant and Registered nurse    Patient Education  Education Given To: Patient  Education Provided: Role of Therapy;Plan of Care;Home Exercise Program;Precautions;Energy Conservation  Education Method: Demonstration;Verbal;Teach Back;Printed Information/Hand-outs  Barriers to Learning: None  Education Outcome: Verbalized  understanding;Continued education needed        PT/OT sessions occurred together for increased safety of pt and clinician.     , PTA  Minutes: 68

## 2021-06-01 LAB — CBC
Hematocrit: 31 % — ABNORMAL LOW (ref 35.0–47.0)
Hemoglobin: 9.5 g/dL — ABNORMAL LOW (ref 11.5–16.0)
MCH: 28.6 PG (ref 26.0–34.0)
MCHC: 30.6 g/dL (ref 30.0–36.5)
MCV: 93.4 FL (ref 80.0–99.0)
MPV: 8.9 FL (ref 8.9–12.9)
Nucleated RBCs: 0 PER 100 WBC
Platelets: 434 10*3/uL — ABNORMAL HIGH (ref 150–400)
RBC: 3.32 M/uL — ABNORMAL LOW (ref 3.80–5.20)
RDW: 15.4 % — ABNORMAL HIGH (ref 11.5–14.5)
WBC: 10.4 10*3/uL (ref 3.6–11.0)
nRBC: 0 10*3/uL (ref 0.00–0.01)

## 2021-06-01 LAB — BASIC METABOLIC PANEL W/ REFLEX TO MG FOR LOW K
Anion Gap: 6 mmol/L (ref 5–15)
BUN: 23 mg/dL — ABNORMAL HIGH (ref 6–20)
Bun/Cre Ratio: 29 — ABNORMAL HIGH (ref 12–20)
CO2: 22 mmol/L (ref 21–32)
Calcium: 8.5 mg/dL (ref 8.5–10.1)
Chloride: 107 mmol/L (ref 97–108)
Creatinine: 0.8 mg/dL (ref 0.55–1.02)
Est, Glom Filt Rate: >60 mL/min/{1.73_m2} (ref >60)
Glucose: 131 mg/dL — ABNORMAL HIGH (ref 65–100)
Potassium: 3.7 mmol/L (ref 3.5–5.1)
Sodium: 135 mmol/L — ABNORMAL LOW (ref 136–145)

## 2021-06-01 MED FILL — ACETAMINOPHEN EXTRA STRENGTH 500 MG PO TABS: 500 MG | ORAL | Qty: 2

## 2021-06-01 MED FILL — ATORVASTATIN CALCIUM 40 MG PO TABS: 40 MG | ORAL | Qty: 1

## 2021-06-01 MED FILL — ASPIRIN LOW STRENGTH 81 MG PO CHEW: 81 MG | ORAL | Qty: 1

## 2021-06-01 MED FILL — PANTOPRAZOLE SODIUM 40 MG PO TBEC: 40 MG | ORAL | Qty: 1

## 2021-06-01 MED FILL — PEG 3350 17 G PO PACK: 17 g | ORAL | Qty: 1

## 2021-06-01 MED FILL — STERILE WATER FOR INJECTION IJ SOLN: INTRAMUSCULAR | Qty: 20

## 2021-06-01 MED FILL — SYMBICORT 160-4.5 MCG/ACT IN AERO: 160-4.5 MCG/ACT | RESPIRATORY_TRACT | Qty: 6

## 2021-06-01 MED FILL — STIMULANT LAXATIVE 8.6-50 MG PO TABS: ORAL | Qty: 1

## 2021-06-01 MED FILL — CELECOXIB 100 MG PO CAPS: 100 MG | ORAL | Qty: 1

## 2021-06-01 MED FILL — SPIRIVA RESPIMAT 2.5 MCG/ACT IN AERS: 2.5 MCG/ACT | RESPIRATORY_TRACT | Qty: 4

## 2021-06-01 MED FILL — CEFAZOLIN SODIUM 1 G IJ SOLR: 1 g | INTRAMUSCULAR | Qty: 1000

## 2021-06-01 MED FILL — TRAMADOL HCL 50 MG PO TABS: 50 MG | ORAL | Qty: 1

## 2021-06-01 MED FILL — MELATONIN 5 MG PO TABS: 5 MG | ORAL | Qty: 1

## 2021-06-01 MED FILL — METOPROLOL SUCCINATE ER 25 MG PO TB24: 25 MG | ORAL | Qty: 1

## 2021-06-01 NOTE — Other (Signed)
Nutrition Assessment     Type and Reason for Visit: RD Nutrition Re-Screen/LOS    Nutrition Recommendations/Plan:   Continue current diet  Monitor and record PO intakes and Bms in I/Os     Malnutrition Assessment:  Malnutrition Status: No malnutrition    Nutrition Assessment:  Admitted for femur fx. S/p ORIF revision. Discharge pending placement. Intakes >76%, no reported weight loss. Labs: Na 135, K 3.7, BUN 23, Creat .8, Gluc 131. Meds: atorvastatin, pantoprazole, polyethylene glycol, sennosides-docusate sodium    Nutrition Related Findings:   (P) No acute findings per NFPE. No n/v, d/c, or problems chewing/swallowing. No edema. BM 5/10. Wound Type: None    Current Nutrition Therapies:    ADULT DIET; Regular    Anthropometric Measures:  Height: 160 cm (5' 2.99")  Current Body Wt: 112 lb 7 oz (51 kg)   BMI: 19.9    Nutrition Diagnosis:   (P) No nutrition diagnosis at this time     Nutrition Interventions:   Food and/or Nutrient Delivery: (P) Continue Current Diet  Nutrition Education/Counseling: (P) No recommendation at this time  Coordination of Nutrition Care: (P) Continue to monitor while inpatient    Nutrition Monitoring and Evaluation:   Behavioral-Environmental Outcomes: (P) None Identified  Food/Nutrient Intake Outcomes: (P) Food and Nutrient Intake  Physical Signs/Symptoms Outcomes: (P) Meal Time Behavior, Weight    Discharge Planning:    (P) Too soon to determine     Nicholes Calamity, RD  Contact: [phone]

## 2021-06-01 NOTE — Care Coordination-Inpatient (Signed)
CM reviewed chart. Patient's discharge disposition is IRF, and has been accepted by [facility]. Patient's insurance has requested a Peer to Peer with Dr. Arnoldo Morale, the MD at [facility], which is scheduled for 1300 today. Discharge is pending auth.

## 2021-06-01 NOTE — Progress Notes (Signed)
[facility] Hospitalist Progress Note  Keltin Baird Dena Billet MD    Date:06/01/2021       Room:439/01  Patient Name:[name]     Date of Birth:Jun 14, 1945     Age:76 y.o.    12/01/20 echocardiogram    Left Ventricle: Low normal left ventricular systolic function with a visually estimated EF of 50 - 55%. Left ventricle is smaller than normal. Mild septal thickening. Mild posterior thickening. See diagram for wall motion findings. Grade I diastolic dysfunction with normal LAP.    Right Ventricle: Reduced systolic function.    Aortic Valve: Tricuspid valve. Mild sclerosis of the aortic valve cusp. Mild regurgitation.    Mitral Valve: Valve structure is normal. Mild annular calcification at the posterior leaflet of the mitral valve. Mild to moderate paravalvular regurgitation.    Right Atrium: Right atrium is mildly dilated.    Technical qualifiers: Echo study was technically difficult due to patient's body habitus.    05/24/21 admission course  [name] is a 76 y.o. female with PMH of COPD, HLP and recent fall with left femur fracture s/p ORIF of the left hip. She presented to the ED with chief complaint of fall and left hip pain. She reports mechanical fall when walking at home today,  was not using walker. She denies LOC, lightheadedness or vertigo prior to fall. No focal neurology symptoms. She report left hip pain, severe in nature, worsened with movement.     In the ED, noted hypertensive. X-ray showed acute fracture of the left femoral greater and lesser trochanters.    Brief Postoperative Note   Patient: [name]  Date of Birth: Jul 31, 1945  MRN: [id number]   Date of Procedure: 05/26/2021    Pre-Op Diagnosis: Left hip periprosthetic fracture with loose stem   Post-Op Diagnosis: Same as above   Procedure(s):  LEFT HIP REVISION POSTERIOR APPROACH WITH ALLOGRAFT   Surgeon(s):  Helene Kelp, MD   Surgical Assistant: Surg Asst-1: Jalene Mullet; Larene Pickett   Anesthesia: General    Estimated Blood Loss (mL): 200    Complications: None    05/31/21 orthopedics  Status post removal hardware/ORIF revision left hip hemiarthroplasty.  Postop day #5.  Continue with therapy as tolerated.  Continue with aspirin for DVT prophylaxis.  Will increase tramadol to 50 to 100 mg every 6 hours as needed pain.  Awaiting rehab placement.  Okay to discharge from orthopedics when cleared by medicine and placement has been made.  Patient will follow-up with Dr. Gerda Diss as outpatient on Jun 10, 2021    06/01/21 no new complaints, tolerating medications well  Postop course progressing well  Rehab placement anticipated    Subjective    Subjective:  Symptoms:  Stable.     Review of Systems   All other systems reviewed and are negative.  Objective         Vitals Last 24 Hours:  TEMPERATURE:  Temp  Avg: 97.9 F (36.6 C)  Min: 97.6 F (36.4 C)  Max: 98.1 F (36.7 C)  RESPIRATIONS RANGE: Resp  Avg: 16.3  Min: 16  Max: 17  PULSE OXIMETRY RANGE: SpO2  Avg: 97 %  Min: 97 %  Max: 97 %  PULSE RANGE: Pulse  Avg: 81  Min: 79  Max: 84  BLOOD PRESSURE RANGE: Systolic (24hrs), Avg:112 , Min:111 , Max:112   ; Diastolic (24hrs), Avg:72, Min:65, Max:78    I/O (24Hr):    Intake/Output Summary (Last 24 hours) at 06/01/2021 4098  Last data filed at  05/31/2021 2323  Gross per 24 hour   Intake 300 ml   Output --   Net 300 ml       Vitals:    05/31/21 2042 05/31/21 2101 05/31/21 2323 06/01/21 0808   BP:  112/72 112/65 111/78   Pulse:  84 80 79   Resp:  Temp:  98 F (36.7 C) 97.6 F (36.4 C) 98.1 F (36.7 C)   TempSrc:  Oral Oral    SpO2: 97%  97%    Weight:       Height:          Objective:  General Appearance:  Comfortable.    Vital signs: (most recent): Blood pressure 111/78, pulse 79, temperature 98.1 F (36.7 C), resp. rate 17, height 1.6 m ( ), weight 51 kg (112 lb 7 oz), SpO2 97 %.    General: NAD, pleasant and cooperative   HEENT: anicteric, moist oral mucosa   CV: RRR, no murmurs, 2+ pulses   Respiratory: clear and aerating well, no increased  WOB  Abdomen: nondistended, nontender  Extremities: no peripheral edema   Musculoskeletal: moving extremities at least antigravity, L DROM, dressing in place with pico drain  Neuro: alert, normal speech, CN 2-12 intact, sensation intact to light touch     Labs/Imaging/Diagnostics    Labs:  CBC:No results for input(s): WBC, RBC, HGB, HCT, MCV, RDW, PLT in the last 72 hours.  CHEMISTRIES:No results for input(s): NA, K, CL, CO2, BUN, CREATININE, GLUCOSE, CA, PHOS, MG in the last 72 hours.  PT/INR:No results for input(s): PROTIME, INR in the last 72 hours.  APTT:No results for input(s): APTT in the last 72 hours.  LIVER PROFILE:No results for input(s): AST, ALT, BILIDIR, BILITOT, ALKPHOS in the last 72 hours.    Imaging Last 24 Hours:  No results found.  Assessment//Plan           Recent Results (from the past 200 hour(s))   Basic Metabolic Panel    Collection Time: 05/24/21  5:49 PM   Result Value Ref Range    Sodium 135 (L) 136 - 145 mmol/L    Potassium 5.0 3.5 - 5.1 mmol/L    Chloride 105 97 - 108 mmol/L    CO2 23 21 - 32 mmol/L    Anion Gap 7 5 - 15 mmol/L    Glucose 124 (H) 65 - 100 mg/dL    BUN 13 6 - 20 mg/dL    Creatinine 9.14 7.82 - 1.02 mg/dL    Bun/Cre Ratio 15 12 - 20      ESTIMATED GLOMERULAR FILTRATION RATE >60 >60 ml/min/1.56m2    Calcium 9.5 8.5 - 10.1 mg/dL   CBC with Auto Differential    Collection Time: 05/24/21  6:44 PM   Result Value Ref Range    WBC 12.5 (H) 3.6 - 11.0 K/uL    RBC 4.32 3.80 - 5.20 M/uL    Hemoglobin 12.6 11.5 - 16.0 g/dL    Hematocrit 95.6 21.3 - 47.0 %    MCV 90.3 80.0 - 99.0 FL    MCH 29.2 26.0 - 34.0 PG    MCHC 32.3 30.0 - 36.5 g/dL    RDW 08.6 (H) 57.8 - 14.5 %    Platelets 282 150 - 400 K/uL    MPV 8.7 (L) 8.9 - 12.9 FL    Nucleated RBCs 0.0 0.0 PER 100 WBC    NRBC Absolute 0.00 0.00 - 0.01 K/uL    Neutrophils %  79 (H) 32 - 75 %    Lymphocytes % 13 12 - 49 %    Monocytes % 6 5 - 13 %    Eosinophils % 1 0 - 7 %    Basophils % 0 0 - 1 %    Immature Granulocytes 1 (H) 0 - 0.5 %     Neutrophils Absolute 9.8 (H) 1.8 - 8.0 K/UL    Lymphocytes Absolute 1.6 0.8 - 3.5 K/UL    Monocytes Absolute 0.8 0.0 - 1.0 K/UL    Eosinophils Absolute 0.2 0.0 - 0.4 K/UL    Basophils Absolute 0.1 0.0 - 0.1 K/UL    Granulocyte Absolute Count 0.1 (H) 0.00 - 0.04 K/UL    Differential Type AUTOMATED     Troponin, High Sensitivity    Collection Time: 05/24/21  6:51 PM   Result Value Ref Range    Troponin, High Sensitivity 6 0 - 51 ng/L   Protime-INR    Collection Time: 05/24/21  6:51 PM   Result Value Ref Range    Protime 13.4 11.9 - 14.6 sec    INR 1.0 0.9 - 1.1     APTT    Collection Time: 05/24/21  6:51 PM   Result Value Ref Range    aPTT 27.4 21.2 - 34.1 sec    Therapeutic Range   82 - 109 sec   Vitamin D 25 Hydroxy    Collection Time: 05/25/21  5:06 AM   Result Value Ref Range    Vit D, 25-Hydroxy 18.0 (L) 30 - 100 ng/mL   Basic Metabolic Panel    Collection Time: 05/25/21  5:06 AM   Result Value Ref Range    Sodium 134 (L) 136 - 145 mmol/L    Potassium 4.1 3.5 - 5.1 mmol/L    Chloride 104 97 - 108 mmol/L    CO2 27 21 - 32 mmol/L    Anion Gap 3 (L) 5 - 15 mmol/L    Glucose 111 (H) 65 - 100 mg/dL    BUN 16 6 - 20 mg/dL    Creatinine 1.610.95 0.960.55 - 1.02 mg/dL    Bun/Cre Ratio 17 12 - 20      ESTIMATED GLOMERULAR FILTRATION RATE >60 >60 ml/min/1.9773m2    Calcium 9.1 8.5 - 10.1 mg/dL   CBC    Collection Time: 05/25/21  5:06 AM   Result Value Ref Range    WBC 12.1 (H) 3.6 - 11.0 K/uL    RBC 4.37 3.80 - 5.20 M/uL    Hemoglobin 12.7 11.5 - 16.0 g/dL    Hematocrit 04.539.6 40.935.0 - 47.0 %    MCV 90.6 80.0 - 99.0 FL    MCH 29.1 26.0 - 34.0 PG    MCHC 32.1 30.0 - 36.5 g/dL    RDW 81.115.2 (H) 91.411.5 - 14.5 %    Platelets 280 150 - 400 K/uL    MPV 8.5 (L) 8.9 - 12.9 FL    Nucleated RBCs 0.0 0.0 PER 100 WBC    NRBC Absolute 0.00 0.00 - 0.01 K/uL   EKG 12 Lead    Collection Time: 05/25/21  8:18 AM   Result Value Ref Range    Ventricular Rate 121 BPM    Atrial Rate 121 BPM    P-R Interval 158 ms    QRS Duration 64 ms    Q-T Interval 302 ms     QTc Calculation (Bazett) 428 ms    P Axis 48 degrees    R  Axis -11 degrees    T Axis 51 degrees    Diagnosis       Sinus tachycardia  Cannot rule out Anterior infarct , age undetermined  Abnormal ECG  No previous ECGs available  Confirmed by Nelson Chimes ([id number]) on 05/25/2021 8:18:33 AM     Culture, Urine    Collection Time: 05/26/21 12:15 PM    Specimen: Urine   Result Value Ref Range    Special Requests No Special Requests      Culture No Growth (<1000 cfu/mL)     Type and Screen    Collection Time: 05/26/21  6:16 PM   Result Value Ref Range    Crossmatch expiration date 05/29/2021,2359     ABO/Rh O Positive     Antibody Screen Negative     XR PELVIS (1-2 VIEWS)    Result Date: 05/26/2021  EXAM: XR PELV AP ONLY INDICATION: . COMPARISON: None. FINDINGS: An AP view  of the pelvis demonstrates no fracture, lytic or blastic lesion or other abnormality. The soft tissues are normal.     Normal pelvis.    XR PELVIS (1-2 VIEWS)    Result Date: 05/24/2021  EXAM: XR PELV AP ONLY, XR FEMUR LT 2 V INDICATION: Pain. Unable to stand.. COMPARISON: Radiographs 01/26/2021. FINDINGS: An AP view  of the pelvis and 4 views of the left femur demonstrates moderate diffuse osteopenia. There is a left femoral bipolar hemiarthroplasty with noncemented femoral component. There is interval subsidence of the femoral component measuring approximately 15 mm. There are fractures of the greater and lesser trochanters of the left femur. No other femoral fracture is shown. No innominate bone fracture is demonstrated. 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 Extensive atherosclerotic calcifications are noted.     Marked diffuse osteopenia. Left femoral bipolar hemiarthroplasty with interval subsidence measuring 15 mm. Acute appearing fractures of left femoral greater and lesser trochanters.    XR FEMUR LEFT (MIN 2 VIEWS)    Result Date: 05/24/2021  EXAM: XR PELV AP ONLY, XR FEMUR LT 2 V INDICATION: Pain.  Unable to stand.. COMPARISON: Radiographs 01/26/2021. FINDINGS: An AP view  of the pelvis and 4 views of the left femur demonstrates moderate diffuse osteopenia. There is a left femoral bipolar hemiarthroplasty with noncemented femoral component. There is interval subsidence of the femoral component measuring approximately 15 mm. There are fractures of the greater and lesser trochanters of the left femur. No other femoral fracture is shown. No innominate bone fracture is demonstrated. 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 Extensive atherosclerotic calcifications are noted.     Marked diffuse osteopenia. Left femoral bipolar hemiarthroplasty with interval subsidence measuring 15 mm. Acute appearing fractures of left femoral greater and lesser trochanters.    XR KNEE LEFT (3 VIEWS)    Result Date: 05/24/2021  INDICATION:  fall Exam: AP, lateral, oblique views of the left knee. FINDINGS: There is no acute fracture-dislocation. There is marked narrowing of the medial lateral tibiofemoral joint spaces. The osseous structures are diffusely demineralized. No suprapatellar joint fluid is visualized.     No acute fracture or dislocation.      XR CHEST PORTABLE    Result Date: 05/24/2021  EXAM:  XR CHEST PORT INDICATION: Generalized lung crackles COMPARISON: 01/02/2021, CT 01/04/2021 TECHNIQUE: 1922 hours portable chest AP view FINDINGS: Mediastinal and hilar contours are stable with mild thoracic aortic tortuosity again shown. The cardiac silhouette is within normal limits. The pulmonary  vasculature is within normal limits. The lungs and pleural spaces are clear. Bones are severely osteopenic.     No acute process on portable chest.     CT Lower Extremity Left WO Contrast    Result Date: 05/25/2021  EXAM: CT LOW EXT LT WO CONT INDICATION: Fall. Fracture. COMPARISON: Radiographs same day TECHNIQUE: Helical CT of the left femur with coronal and sagittal reformats. Images reviewed in  soft tissue and bone windows.  CT dose reduction was achieved through the use of a standardized protocol tailored for this examination and automatic exposure control for dose modulation. CONTRAST: None. FINDINGS: Bones: The bones are osteopenic. The patient is status post left hip hemiarthroplasty. No dislocation. There is a periprosthetic fracture in the proximal left femur in the intertrochanteric region. There is mild subsidence. Joint fluid: No significant joint effusion. Articulations: No significant osteoarthritis. No evidence of inflammatory arthritis. Tendons: No full-thickness tendon tear. Muscles: No intramuscular hematoma. No focal atrophy. Soft tissue mass: No mass. There is soft tissue hematoma in the musculature surrounding the left hip.     Status post left hip hemiarthroplasty. There is an acute periprosthetic fracture in the intertrochanteric region with mild subsidence.    XR HIP 2-3 VW W PELVIS LEFT    Result Date: 05/26/2021  EXAM: XR HIP LT W OR WO PELV 2-3 VWS INDICATION: hip revision left. COMPARISON: Prior day. FINDINGS: AP view of the pelvis and a frogleg lateral view of the left hip demonstrate a left hip replacement in progress. There is gas and fluid in the adjacent soft tissues. Head of the hip prosthesis is currently absent.     Left hip replacement revision in progress.    Assessment & Plan      12/01/20 echocardiogram    Left Ventricle: Low normal left ventricular systolic function with a visually estimated EF of 50 - 55%. Left ventricle is smaller than normal. Mild septal thickening. Mild posterior thickening. See diagram for wall motion findings. Grade I diastolic dysfunction with normal LAP.    Right Ventricle: Reduced systolic function.    Aortic Valve: Tricuspid valve. Mild sclerosis of the aortic valve cusp. Mild regurgitation.    Mitral Valve: Valve structure is normal. Mild annular calcification at the posterior leaflet of the mitral valve. Mild to moderate paravalvular  regurgitation.    Right Atrium: Right atrium is mildly dilated.    Technical qualifiers: Echo study was technically difficult due to patient's body habitus.    05/24/21 admission course  Sumiye Hirth is a 76 y.o. female with PMH of COPD, HLP and recent fall with left femur fracture s/p ORIF of the left hip. She presented to the ED with chief complaint of fall and left hip pain. She reports mechanical fall when walking at home today,  was not using walker. She denies LOC, lightheadedness or vertigo prior to fall. No focal neurology symptoms. She report left hip pain, severe in nature, worsened with movement.     In the ED, noted hypertensive. X-ray showed acute fracture of the left femoral greater and lesser trochanters.    Brief Postoperative Note   Patient: Terea Neubauer  Date of Birth: 05-Apr-1945  MRN: [id number]   Date of Procedure: 05/26/2021    Pre-Op Diagnosis: Left hip periprosthetic fracture with loose stem   Post-Op Diagnosis: Same as above   Procedure(s):  LEFT HIP REVISION POSTERIOR APPROACH WITH ALLOGRAFT   Surgeon(s):  Helene Kelp, MD   Surgical Assistant: Surg Asst-1: Jalene Mullet; Larene Pickett  Anesthesia: General    Estimated Blood Loss (mL): 200   Complications: None    05/31/21 orthopedics  Status post removal hardware/ORIF revision left hip hemiarthroplasty.  Postop day #5.  Continue with therapy as tolerated.  Continue with aspirin for DVT prophylaxis.  Will increase tramadol to 50 to 100 mg every 6 hours as needed pain.  Awaiting rehab placement.  Okay to discharge from orthopedics when cleared by medicine and placement has been made.  Patient will follow-up with Dr. Gerda Diss as outpatient on Jun 10, 2021    06/01/21 no new complaints, tolerating medications well  Postop course progressing well  Rehab placement anticipated    MICROBIOLOGY    05/26/21  Urine  Negative    ASSESSMENT AND PLAN    1) Left femur acute fracture s/p operative management as above     Cleared per orthopedics   Physical  therapy   Awaiting rehab placement   Outpatient follow up with orthopedics 06/10/21    2) Cardiovascular issues including hypertension and dyslipidemia     Echocardiogram as above   Medical management    Aspirin    Atorvastatin    Metoprolol    3) COPD not in acute exacerbation    4) Dispo IRF anticipated      Electronically signed by Jammy Stlouis Dena Billet, MD on 05/31/21 at 10:26 AM EDT

## 2021-06-01 NOTE — Progress Notes (Signed)
Confirmed with Dr Deatra Canter, ok for pt to remain without IV access.

## 2021-06-01 NOTE — Plan of Care (Signed)
Problem: Physical Therapy - Adult  Goal: By Discharge: Performs mobility at highest level of function for planned discharge setting.  See evaluation for individualized goals.  Description: Problem: Mobility Impaired (Adult and Pediatric)  Goal: *Acute Goals and Plan of Care (Insert Text)  Description: FUNCTIONAL STATUS PRIOR TO ADMISSION: Patient was independent and active without use of DME.    HOME SUPPORT PRIOR TO ADMISSION: The patient lived alone with no local support.    Physical Therapy Goals  Initiated 05/27/2021  Patient/family stated goal: to get stronger and walk   1. Patient will move from supine to sit and sit to supine in bed with supervision/set-up within 7 day(s).   2. Patient will transfer from bed to chair and chair to bed with supervision/set-up using the least restrictive device within 7 day(s).  3. Patient will perform sit to stand with supervision/set-up within 7 day(s).  4. Patient will ambulate with supervision/set-up for 25 feet with the least restrictive device within 7 day(s).   5. Patient will ascend/descend 4 stairs with B handrail(s) with supervision/set-up within 7 day(s).  6. Patient will participate in lower extremity therapeutic exercise/activities with supervision/set-up for 10 minutes within 7 day(s).     Outcome: Progressing            PHYSICAL THERAPY TREATMENT     Patient: [name] (76 y.o. female)  Date: 06/01/2021  Diagnosis: Femur fracture (HCC) [S72.90XA] <principal problem not specified>      Precautions:  Hip Precautions: Posterior hip precautions  In place during session: EKG/telemetry   Chart, physical therapy assessment, plan of care and goals were reviewed.    ASSESSMENT  Patient continues with skilled PT services and is progressing towards goals. Pt received semi supine  upon PT arrival, agreeable to session. Pt A&O x 4  and ready to get to chair.  (See below for objective details and assist levels).     Overall pt tolerated session fair today with Tfs and  ambulation. Pt. Continues to require moderate assist for standing with RW. Pt. Leaning posteriorly with standing and RW at EOB. Pt. Unable to correct balance and required mod/max assist to maintain standing initially. Pt. Requires max cues and assist for balance. Pt. Performed 3-4 trials of sit to stand before being able to stand with min assist X 1. Gt. Remains unsteady with 1-2 episodes of LOB. Pt.  Pt. Increased gt. Distance slightly with RW and required min/mod assist due to poor balance. Pt. Leans posteriorly with ambulation and unable to correct balance without assistance. Pt. Maintains narrow base of support and needed vcs to correct during ambulation. Pt. Required several rest breaks throughout session due to fatigue.  Will continue to benefit from skilled PT services, and will continue to progress as tolerated.     Current Level of Function Impacting Discharge (mobility/balance): Min/Mod A x 1 for Mobility    Other factors to consider for discharge: lives alone, poor safety awareness, high risk for falls, and not safe to be alone       PLAN :  Patient continues to benefit from skilled intervention to address impaired functional mobility, impaired strength, poor body mechanics, decreased activity tolerance, poor safety awareness, impaired balance, impaired posture. Continue treatment per established plan of care to address goals.    Recommend with staff: Out of bed to chair for meals, Encourage HEP in prep for ADLs/mobility, and LE elevation for management of edema    Recommendation for discharge: (in order for the patient to meet  his/her long term goals)  Inpatient Rehabilitation Facility     IF patient discharges home will need the following DME: TBD       SUBJECTIVE:   Patient stated "I need to go to the bathroom."    OBJECTIVE DATA SUMMARY:   Critical Behavior:  Orientation  Orientation Level: Oriented X4  Cognition  Arousal/Alertness: Appropriate responses to stimuli  Following Commands: Follows one  step commands with increased time;Follows one step commands with repetition  Memory: Decreased recall of precautions  Safety Judgement: Decreased awareness of need for assistance;Decreased awareness of need for safety  Insights: Decreased awareness of deficits  Initiation: Requires cues for some  Sequencing: Requires cues for some    Functional Mobility Training:  Bed Mobility:  Bed Mobility Training  Interventions: Verbal cues;Tactile cues  Rolling: Stand-by assistance  Supine to Sit: Minimum assistance  Scooting: Stand-by assistance    Pt. Had BM in bed and needed to be cleaned and changed. RN in to assist with linens and pt.     Transfers:  Transfer Training  Interventions: Demonstration;Safety awareness training;Verbal cues;Tactile cues;Weight shifting training/pressure relief  Sit to Stand: Moderate assistance  Stand to Sit: Moderate assistance  Bed to Chair: Moderate assistance  Balance:  Balance  Sitting: Intact  Standing: Impaired  Standing - Static: Constant support;Fair  Standing - Dynamic: Constant support;Poor   Wheelchair Mobility:      Ambulation/Gait Training:     Gait  Overall Level of Assistance: Minimum assistance;Moderate assistance  Interventions: Demonstration;Safety awareness training;Verbal cues;Weight shifting training/pressure relief  Base of Support: Narrowed  Speed/Cadence: Slow  Step Length: Left shortened;Right shortened  Stance: Left decreased  Gait Abnormalities: Step to gait  Distance (ft): 20 Feet  Assistive Device: Gait belt;Walker, rolling    Reviewed all hand out with precautions and exercises prior to session.  Therapeutic Exercises:     EXERCISE   Sets   Reps   Active Active Assist   Passive Self ROM   Comments   Ankle Pumps  20 [x]  []  []  []     Quad Sets/Glut Sets  20 [x]  []  []  []  L LE   Hamstring Sets   []  []  []  []     Short Arc Quads   []  []  []  []     Heel Slides  20 [x]  []  []  []  L LE   Straight Leg Raises   []  []  []  []     Hip abd/add  20 [x]  []  []  []  Mod assist L LE   Long Arc  Quads  10 [x]  []  []  []     Marching   []  []  []  []        []  []  []  []        Pain Rating:  4/10 at rest   9/10 with gt.  Pain Intervention(s):   nursing notified, elevation, and repositioning    Activity Tolerance:   Fair  and requires frequent rest breaks    After treatment patient left in no apparent distress:   Bed locked and returned to lowest position, Patient left in no apparent distress sitting up in chair and Call bell within reach, and nsg updated       COMMUNICATION/COLLABORATION:   The patient's plan of care was discussed with: Registered nurse    Patient Education  Education Given To: Patient  Education Provided: Role of Therapy;Plan of Care;Home Exercise Program;Precautions;Transfer Training;Energy Conservation  Education Method: Demonstration;Verbal;Teach Back  Barriers to Learning: Other (Comment) (Pt. unable to remember precautions/exercises--gave hand out)  Education Outcome: Verbalized  understanding;Continued education needed        Donnetta Hail, PTA  Minutes: 7

## 2021-06-02 MED FILL — PEG 3350 17 G PO PACK: 17 g | ORAL | Qty: 1

## 2021-06-02 MED FILL — STIMULANT LAXATIVE 8.6-50 MG PO TABS: ORAL | Qty: 1

## 2021-06-02 MED FILL — ACETAMINOPHEN EXTRA STRENGTH 500 MG PO TABS: 500 MG | ORAL | Qty: 2

## 2021-06-02 MED FILL — METOPROLOL SUCCINATE ER 25 MG PO TB24: 25 MG | ORAL | Qty: 1

## 2021-06-02 MED FILL — CELECOXIB 100 MG PO CAPS: 100 MG | ORAL | Qty: 1

## 2021-06-02 MED FILL — ASPIRIN LOW STRENGTH 81 MG PO CHEW: 81 MG | ORAL | Qty: 1

## 2021-06-02 MED FILL — ATORVASTATIN CALCIUM 40 MG PO TABS: 40 MG | ORAL | Qty: 1

## 2021-06-02 MED FILL — PANTOPRAZOLE SODIUM 40 MG PO TBEC: 40 MG | ORAL | Qty: 1

## 2021-06-02 MED FILL — SPIRIVA RESPIMAT 2.5 MCG/ACT IN AERS: 2.5 MCG/ACT | RESPIRATORY_TRACT | Qty: 4

## 2021-06-02 MED FILL — MELATONIN 5 MG PO TABS: 5 MG | ORAL | Qty: 1

## 2021-06-02 MED FILL — SYMBICORT 160-4.5 MCG/ACT IN AERO: 160-4.5 MCG/ACT | RESPIRATORY_TRACT | Qty: 6

## 2021-06-02 NOTE — Plan of Care (Signed)
PHYSICAL THERAPY TREATMENT     Patient: [name] (76 y.o. female)  Date: 06/02/2021  Diagnosis: Femur fracture (HCC) [S72.90XA] <principal problem not specified>      Precautions:  Hip Precautions: Posterior hip precautions  In place during session: External Catheter and EKG/telemetry   Chart, physical therapy assessment, plan of care and goals were reviewed.    ASSESSMENT  Patient continues with skilled PT services and is progressing towards goals. Pt supine in bed upon PT arrival, agreeable to session. Pt A&O x 4.  Pt showed progression during today's treatment session. Pt was unable to recall posterior hip precautions upon being asked. Educated Pt on precautions. Pt performed bed mobility CGA to EOB. Pt sit > stand Min A and ambulated 13 ft before sitting Mod A in recliner chair due to pain and decreased activity tolerance. Pt transferred to commode and PTA and COTA assisted in peri care, then transferred back to recliner. Pt performed therex, see chart below. (See below for objective details and assist levels). Overall pt tolerated session well today. Will continue to benefit from skilled PT services, and will continue to progress as tolerated.     Current Level of Function Impacting Discharge (mobility/balance): CGA/ Min A    Other factors to consider for discharge:  Pt does not remember posterior hip precautions when asked        PLAN :  Patient continues to benefit from skilled intervention to address decreased activity tolerance. Continue treatment per established plan of care to address goals.    Recommendation for discharge: (in order for the patient to meet his/her long term goals)  Inpatient Rehabilitation Facility     IF patient discharges home will need the following DQQ:IWLN       SUBJECTIVE:   Patient stated "My hip hasn't been bothering me."    OBJECTIVE DATA SUMMARY:   Critical Behavior:  Orientation  Overall Orientation Status: Within Normal Limits  Orientation Level: Oriented  X4    Cognition  Overall Cognitive Status: WNL  Arousal/Alertness: Appropriate responses to stimuli  Following Commands: Follows one step commands with increased time;Follows one step commands with repetition  Memory: Decreased recall of precautions  Safety Judgement: Decreased awareness of need for safety  Insights: Decreased awareness of deficits  Initiation: Requires cues for some  Sequencing: Requires cues for all    Functional Mobility Training:  Bed Mobility:  Bed Mobility Training  Bed Mobility Training: Yes  Overall Level of Assistance: Contact-guard assistance  Interventions: Verbal cues  Rolling: Contact-guard assistance  Supine to Sit: Contact-guard assistance  Scooting: Contact-guard assistance  Transfers:  Art therapist: Yes  Overall Level of Assistance: Minimum assistance  Interventions: Verbal cues;Tactile cues  Sit to Stand: Minimum assistance  Stand to Sit: Moderate assistance  Toilet Transfer: Moderate assistance;Additional time  Balance:  Balance  Sitting: Intact  Standing: Impaired  Standing - Static: Constant support;Fair  Standing - Dynamic: Constant support;Fair   Charity fundraiser Management: No  Ambulation/Gait Training:     Gait  Overall Level of Assistance: Minimum assistance  Interventions: Tactile cues;Verbal cues  Base of Support: Narrowed  Speed/Cadence: Slow  Step Length: Left shortened;Right shortened  Gait Abnormalities: Step to gait  Distance (ft): 13 Feet  Assistive Device: Walker, rolling;Gait belt      Therapeutic Exercises:       EXERCISE   Sets   Reps   Active Active Assist   Passive Self ROM   Comments  Ankle Pumps  10 [x]  []  []  []     Quad Sets/Glut Sets  10 [x]  []  []  []     Hamstring Sets   []  []  []  []     Short Arc Quads   []  []  []  []     Heel Slides  10 [x]  []  []  []     Straight Leg Raises   []  []  []  []     Hip abd/add   []  []  []  []     Long Arc Quads  10 [x]  []  []  []     Marching   []  []  []  []        []  []  []  []         Pain Rating:  8-9/10 in L Lateral Knee during movement  0/10 in L Lateral Knee at rest    Activity Tolerance:   Fair     After treatment patient left in no apparent distress:   Bed locked and returned to lowest position, Patient left in no apparent distress sitting up in chair and Call bell within reach, and nsg updated   PT/OT sessions occurred together for increased safety of pt and clinician.     COMMUNICATION/COLLABORATION:   The patient's plan of care was discussed with: Registered nurse    Patient Education  Education Given To: Patient  Education Provided: Precautions;Home Exercise Program;Energy Conservation  Education Method: Demonstration;Verbal;Teach Back  Education Outcome: Verbalized understanding;Continued education needed    , PTA  Written By: , PTA  Minutes: 53           Problem: Physical Therapy - Adult  Goal: By Discharge: Performs mobility at highest level of function for planned discharge setting.  See evaluation for individualized goals.  Description: Problem: Mobility Impaired (Adult and Pediatric)  Goal: *Acute Goals and Plan of Care (Insert Text)  Description: FUNCTIONAL STATUS PRIOR TO ADMISSION: Patient was independent and active without use of DME.    HOME SUPPORT PRIOR TO ADMISSION: The patient lived alone with no local support.    Physical Therapy Goals  Initiated 05/27/2021  Patient/family stated goal: to get stronger and walk   1. Patient will move from supine to sit and sit to supine in bed with supervision/set-up within 7 day(s).   2. Patient will transfer from bed to chair and chair to bed with supervision/set-up using the least restrictive device within 7 day(s).  3. Patient will perform sit to stand with supervision/set-up within 7 day(s).  4. Patient will ambulate with supervision/set-up for 25 feet with the least restrictive device within 7 day(s).   5. Patient will ascend/descend 4 stairs with B handrail(s) with supervision/set-up within 7  day(s).  6. Patient will participate in lower extremity therapeutic exercise/activities with supervision/set-up for 10 minutes within 7 day(s).     Outcome: Progressing

## 2021-06-02 NOTE — Plan of Care (Deleted)
Problem: Physical Therapy - Adult  Goal: By Discharge: Performs mobility at highest level of function for planned discharge setting.  See evaluation for individualized goals.  Description: Problem: Mobility Impaired (Adult and Pediatric)  Goal: *Acute Goals and Plan of Care (Insert Text)  Description: FUNCTIONAL STATUS PRIOR TO ADMISSION: Patient was independent and active without use of DME.    HOME SUPPORT PRIOR TO ADMISSION: The patient lived alone with no local support.    Physical Therapy Goals  Initiated 05/27/2021  Patient/family stated goal: to get stronger and walk   1. Patient will move from supine to sit and sit to supine in bed with supervision/set-up within 7 day(s).   2. Patient will transfer from bed to chair and chair to bed with supervision/set-up using the least restrictive device within 7 day(s).  3. Patient will perform sit to stand with supervision/set-up within 7 day(s).  4. Patient will ambulate with supervision/set-up for 25 feet with the least restrictive device within 7 day(s).   5. Patient will ascend/descend 4 stairs with B handrail(s) with supervision/set-up within 7 day(s).  6. Patient will participate in lower extremity therapeutic exercise/activities with supervision/set-up for 10 minutes within 7 day(s).     06/02/2021 1017 by Debbra Riding, PTA  Outcome: Progressing

## 2021-06-02 NOTE — Progress Notes (Signed)
Orthopedic progress note    Date:06/02/2021       Room:439/01  Patient Name:[name]     Date of Birth:10-16-1945     Age:76 y.o.      Subjective    Status post removal of hardware/ORIF revision left hemiarthroplasty. P.O.D.#7  Patient lying comfortably in bed having breakfast this morning.  She states her pain is greatly improved.  Progressing better with therapy.  No other complaints.  Objective           Vitals Last 24 Hours:  TEMPERATURE:  Temp  Avg: 98.2 F (36.8 C)  Min: 98.1 F (36.7 C)  Max: 98.2 F (36.8 C)  RESPIRATIONS RANGE: Resp  Avg: 16.3  Min: 15  Max: 18  PULSE OXIMETRY RANGE: SpO2  Avg: 97.5 %  Min: 96 %  Max: 98 %  PULSE RANGE: Pulse  Avg: 83  Min: 77  Max: 93  BLOOD PRESSURE RANGE: Systolic (24hrs), Avg:117 , Min:105 , Max:130   ; Diastolic (24hrs), Avg:72, Min:53, Max:82    Current Facility-Administered Medications   Medication Dose Route Frequency    melatonin tablet 5 mg  5 mg Oral Nightly PRN    acetaminophen (TYLENOL) tablet 1,000 mg  1,000 mg Oral Q6H    albuterol sulfate HFA (PROVENTIL;VENTOLIN;PROAIR) 108 (90 Base) MCG/ACT inhaler 2 puff  2 puff Inhalation Q4H PRN    aspirin chewable tablet 81 mg  81 mg Oral BID    atorvastatin (LIPITOR) tablet 40 mg  40 mg Oral Nightly    budesonide-formoterol (SYMBICORT) 160-4.5 MCG/ACT inhaler 2 puff  2 puff Inhalation BID    And    tiotropium (SPIRIVA RESPIMAT) 2.5 MCG/ACT inhaler 2 puff  2 puff Inhalation Daily    celecoxib (CELEBREX) capsule 100 mg  100 mg Oral BID    metoprolol succinate (TOPROL XL) extended release tablet 25 mg  25 mg Oral Daily    ondansetron (ZOFRAN-ODT) disintegrating tablet 4 mg  4 mg Oral Q6H PRN    Or    ondansetron (ZOFRAN) injection 4 mg  4 mg IntraVENous Q6H PRN    pantoprazole (PROTONIX) tablet 40 mg  40 mg Oral QAM AC    polyethylene glycol (GLYCOLAX) packet 17 g  17 g Oral Daily    sennosides-docusate sodium (SENOKOT-S) 8.6-50 MG tablet 1 tablet  1 tablet Oral BID    sodium chloride flush 0.9 % injection 5-40 mL   5-40 mL IntraVENous 2 times per day    sodium chloride flush 0.9 % injection 5-40 mL  5-40 mL IntraVENous PRN    0.9 % sodium chloride infusion   IntraVENous PRN    traMADol (ULTRAM) tablet 50 mg  50 mg Oral Q6H PRN          I/O (24Hr):    Intake/Output Summary (Last 24 hours) at 06/02/2021 0839  Last data filed at 06/02/2021 0620  Gross per 24 hour   Intake --   Output 500 ml   Net -500 ml     Objective:  Vital signs: (most recent): Blood pressure 130/81, pulse 93, temperature 98.2 F (36.8 C), temperature source Oral, resp. rate 17, height 1.6 m (5' 2.99"), weight 51 kg (112 lb 7 oz), SpO2 98 %.    Labs/Imaging/Diagnostics    Labs:  CBC:  Recent Labs     06/01/21  1000   WBC 10.4   RBC 3.32*   HGB 9.5*   HCT 31.0*   MCV 93.4   RDW 15.4*   PLT  434*     CHEMISTRIES:  Recent Labs     06/01/21  1000   NA 135*   K 3.7   CL 107   CO2 22   BUN 23*   GLUCOSE 131*   PT/INR:No results for input(s): PROTIME, INR in the last 72 hours.  APTT:No results for input(s): APTT in the last 72 hours.  LIVER PROFILE:No results for input(s): AST, ALT, BILIDIR, BILITOT, ALKPHOS in the last 72 hours.  Lab Results   Component Value Date/Time    ALT 16 01/28/2021 08:50 AM    AST 22 01/28/2021 08:50 AM       Physical Exam:  Left hip: Aquacel Ag dressing was removed by me.  Incision site is healing well.  Small area of punctate bloody drainage seen at distal edge.  Wound edge is closed.  No swelling erythema ecchymosis seen.  No calf pain.  EHL/DF/PF is 3 out of 5.  No calf pain.    Assessment//Plan           Patient Active Problem List    Diagnosis Date Noted    Femur fracture (HCC) 05/24/2021    Closed fracture of neck of left femur (HCC) 01/26/2021    Hip fracture (HCC) 01/25/2021    Sepsis (HCC) 01/02/2021    Cardiac syncope 11/27/2020    Ambulatory dysfunction 11/27/2020     Status post removal hardware/ORIF revision left hemiarthroplasty.  Postop day #7.  New Mepilex Ag dressing applied today.  This will remain on for 7 days unless  saturated.  Continue with therapy as tolerated.  Continue aspirin for DVT prophylaxis.  Okay to discharge from orthopedics when cleared by medicine and placement has been made.  Patient to follow-up with Dr. Gerda Diss as outpatient on Jun 10, 2021.      Electronically signed by Tommi Rumps, PA-C on 06/02/2021 at 8:39 AM

## 2021-06-02 NOTE — Plan of Care (Signed)
OCCUPATIONAL THERAPY TREATMENT  Patient: [name] (76 y.o. female)  Date: 06/02/2021  Primary Diagnosis: Femur fracture (HCC) [S72.90XA]       Precautions:               Hip Precautions: Posterior hip precautions  In place during session: External Catheter and EKG/telemetry   Chart, occupational therapy assessment, plan of care, and goals were reviewed.    ASSESSMENT  Patient continues with skilled OT services and is progressing towards goals. Pt semi supine upon OT arrival, agreeable to session. Pt A&O x 4. Pt pleasant and cooperative during tx session. Pt w/ noted improvements in overall mobility , able to complete bed mobility w/ decreased assistance and increased ambulation in preparation for household mobility. Pt required Assist x 2 for chair follow. Pt requested use of BSC and had a continent void of bowel and bladder. PTA assist w/ standing during total A for posterior pericare in standing. Pt required max A to doff and donn underwear. Pt t/fed back to recliner and completed UE therex, see grid below for details, to maintain/ increase strength and endurance to aid in adl performance. Education provided on LB dressing, HEP, energy conservation, and safety awareness w/ pt verbalizing good understanding. Pt encouraged to complete HEP 3-4 time/day on own.  (See below for objective details and assist levels).     Overall pt tolerated session well today with rest breaks .  Will continue to benefit from skilled OT services, and will continue to progress as tolerated.     Other factors to consider for discharge:  PLOF, time since onset, severity of deficits and decline from functional baseline.         PLAN :  Patient continues to benefit from skilled intervention to address the above impairments.  Continue treatment per established plan of care to address goals.    Recommend with staff: Out of bed to chair for meals and Encourage HEP in prep for ADLs/mobility    Recommend next OT session: Toileting and standing  grooming    Recommendation for discharge: (in order for the patient to meet his/her long term goals): Inpatient Rehabilitation Facility     IF patient discharges home will need the following DME: TBD       SUBJECTIVE:   Patient stated "I stayed up in the chair for 5 hours yesterday."    OBJECTIVE DATA SUMMARY:   Cognitive/Behavioral Status:  Orientation  Overall Orientation Status: Within Normal Limits  Orientation Level: Oriented X4  Cognition  Overall Cognitive Status: WNL  Arousal/Alertness: Appropriate responses to stimuli  Following Commands: Follows one step commands with increased time;Follows one step commands with repetition  Memory: Decreased recall of precautions  Safety Judgement: Decreased awareness of need for safety  Insights: Decreased awareness of deficits  Initiation: Requires cues for some  Sequencing: Requires cues for all    Functional Mobility and Transfers for ADLs:  Bed Mobility:  Bed Mobility Training  Bed Mobility Training: Yes  Overall Level of Assistance: Contact-guard assistance  Interventions: Verbal cues  Rolling: Contact-guard assistance  Supine to Sit: Contact-guard assistance  Scooting: Contact-guard assistance     Transfers:   Art therapist: Yes  Overall Level of Assistance: Minimum assistance  Interventions: Verbal cues, Tactile cues  Sit to Stand: Minimum assistance  Stand to Sit: Moderate assistance  Bed to Chair: Moderate assistance  Toilet Transfer: Moderate assistance, Additional time      Balance:  Standing: Impaired  Balance  Sitting: Intact  Standing: Impaired  Standing - Static: Constant support, Fair  Standing - Dynamic: Constant support, Fair      ADL Intervention:    Equipment Provided: Other (comment) (BSC)    LE Dressing: Dependent/Total    Toileting: Dependent/Total (for posterior pericare, in standing)  Toileting Skilled Clinical Factors: PTA provided support for standing balance.    Therapeutic exercise:    Exercise Sets Reps AROM AAROM PROM  Self PROM Comments   Shoulder flex/ext 1 10 [x]  []  []  []     Elbow flex/ext 1 10 [x]  []  []  []     Wrist flex/ext 1 10 [x]  []  []  []     Hand flex/ ext 1 10 [x]  []  []  []       Pain Rating:  0/10 at rest  8-9/10 w/ mobility  Pain Intervention(s):   nursing notified and addressing    Activity Tolerance:   Good and requires rest breaks    After treatment patient left in no apparent distress:   Bed locked and returned to lowest position, Patient left in no apparent distress sitting up in chair and Call bell within reach, and nsg updated     COMMUNICATION/EDUCATION:   The patient's plan of care was discussed with: Physical therapy assistant and Registered nurse    PT/OT sessions occurred together for increased safety of pt and clinician.          Thank you for this referral.  , OTA  Minutes: 44   Problem: Occupational Therapy - Adult  Goal: By Discharge: Performs self-care activities at highest level of function for planned discharge setting.  See evaluation for individualized goals.  Description: Description: FUNCTIONAL STATUS PRIOR TO ADMISSION: Pt states she was ind with ADLs with no AD PTA.     HOME SUPPORT: Lives alone    Occupational Therapy Goals  Initiated 05/27/2021    Pt stated goal: To return to PLOF  Pt will be Mod I sup <> sit in prep for EOB ADLs  Pt will be Mod I grooming standing sink side LRAD  Pt will be Mod I UB dressing sitting EOB/long sit   Pt will be Mod I LE dressing sitting EOB/long sit  Pt will be Mod I sit <> stand in prep for toileting LRAD  Pt will be Mod I toileting/toilet transfer/cloth mgmt LRAD    Outcome: Progressing

## 2021-06-02 NOTE — Progress Notes (Signed)
[facility] Hospitalist Progress Note  [name] Dena Billet MD    Date:06/02/2021       Room:439/01  Patient Name:[name]     Date of Birth:[birth year]     Age:76 y.o.    12/01/20 echocardiogram    Left Ventricle: Low normal left ventricular systolic function with a visually estimated EF of 50 - 55%. Left ventricle is smaller than normal. Mild septal thickening. Mild posterior thickening. See diagram for wall motion findings. Grade I diastolic dysfunction with normal LAP.    Right Ventricle: Reduced systolic function.    Aortic Valve: Tricuspid valve. Mild sclerosis of the aortic valve cusp. Mild regurgitation.    Mitral Valve: Valve structure is normal. Mild annular calcification at the posterior leaflet of the mitral valve. Mild to moderate paravalvular regurgitation.    Right Atrium: Right atrium is mildly dilated.    Technical qualifiers: Echo study was technically difficult due to patient's body habitus.    05/24/21 admission course  [name] is a 76 y.o. female with PMH of COPD, HLP and recent fall with left femur fracture s/p ORIF of the left hip. She presented to the ED with chief complaint of fall and left hip pain. She reports mechanical fall when walking at home today,  was not using walker. She denies LOC, lightheadedness or vertigo prior to fall. No focal neurology symptoms. She report left hip pain, severe in nature, worsened with movement.     In the ED, noted hypertensive. X-ray showed acute fracture of the left femoral greater and lesser trochanters.    Brief Postoperative Note   Patient: [name]  Date of Birth: [birth year]  MRN: [id number]   Date of Procedure: 05/26/2021    Pre-Op Diagnosis: Left hip periprosthetic fracture with loose stem   Post-Op Diagnosis: Same as above   Procedure(s):  LEFT HIP REVISION POSTERIOR APPROACH WITH ALLOGRAFT   Surgeon(s):  Helene Kelp, MD   Surgical Assistant: Surg Asst-1: Jalene Mullet; Larene Pickett   Anesthesia: General    Estimated Blood Loss (mL): 200    Complications: None    05/31/21 orthopedics  Status post removal hardware/ORIF revision left hip hemiarthroplasty.  Postop day #5.  Continue with therapy as tolerated.  Continue with aspirin for DVT prophylaxis.  Will increase tramadol to 50 to 100 mg every 6 hours as needed pain.  Awaiting rehab placement.  Okay to discharge from orthopedics when cleared by medicine and placement has been made.  Patient will follow-up with Dr. Gerda Diss as outpatient on Jun 10, 2021    06/02/21 no new complaints, tolerating medications well  Postop course progressing well  Rehab placement anticipated    Subjective    Subjective:  Symptoms:  Stable.     Review of Systems   All other systems reviewed and are negative.  Objective         Vitals Last 24 Hours:  TEMPERATURE:  Temp  Avg: 98.2 F (36.8 C)  Min: 98.1 F (36.7 C)  Max: 98.2 F (36.8 C)  RESPIRATIONS RANGE: Resp  Avg: 16.3  Min: 15  Max: 18  PULSE OXIMETRY RANGE: SpO2  Avg: 97.5 %  Min: 96 %  Max: 98 %  PULSE RANGE: Pulse  Avg: 83  Min: 77  Max: 93  BLOOD PRESSURE RANGE: Systolic (24hrs), Avg:117 , Min:105 , Max:130   ; Diastolic (24hrs), Avg:72, Min:53, Max:82    I/O (24Hr):    Intake/Output Summary (Last 24 hours) at 06/02/2021 0817  Last data filed at  06/02/2021 [id number]  Gross per 24 hour   Intake --   Output 500 ml   Net -500 ml       Vitals:    06/01/21 1500 06/01/21 1954 06/01/21 2015 06/02/21 0804   BP: 115/82 (!) 105/53  130/81   Pulse: 80 82 77 93   Resp: 16 16 18 17    Temp: 98.2 F (36.8 C) 98.1 F (36.7 C)  98.2 F (36.8 C)   TempSrc: Oral Oral  Oral   SpO2: 98% 96% 98% 98%   Weight:       Height:          Objective:  General Appearance:  Comfortable.    Vital signs: (most recent): Blood pressure 130/81, pulse 93, temperature 98.2 F (36.8 C), temperature source Oral, resp. rate 17, height 1.6 m (5' 2.99"), weight 51 kg (112 lb 7 oz), SpO2 98 %.    General: NAD, pleasant and cooperative   HEENT: anicteric, moist oral mucosa   CV: RRR, no murmurs, 2+ pulses    Respiratory: clear and aerating well, no increased WOB  Abdomen: nondistended, nontender  Extremities: no peripheral edema   Musculoskeletal: moving extremities at least antigravity, L DROM, dressing in place with pico drain  Neuro: alert, normal speech, CN 2-12 intact, sensation intact to light touch     Labs/Imaging/Diagnostics    Labs:  CBC:  Recent Labs     06/01/21  1000   WBC 10.4   RBC 3.32*   HGB 9.5*   HCT 31.0*   MCV 93.4   RDW 15.4*   PLT 434*     CHEMISTRIES:  Recent Labs     06/01/21  1000   NA 135*   K 3.7   CL 107   CO2 22   BUN 23*   CREATININE 0.80   GLUCOSE 131*     PT/INR:No results for input(s): PROTIME, INR in the last 72 hours.  APTT:No results for input(s): APTT in the last 72 hours.  LIVER PROFILE:No results for input(s): AST, ALT, BILIDIR, BILITOT, ALKPHOS in the last 72 hours.    Imaging Last 24 Hours:  No results found.  Assessment//Plan           Recent Results (from the past 200 hour(s))   Vitamin D 25 Hydroxy    Collection Time: 05/25/21  5:06 AM   Result Value Ref Range    Vit D, 25-Hydroxy 18.0 (L) 30 - 100 ng/mL   Basic Metabolic Panel    Collection Time: 05/25/21  5:06 AM   Result Value Ref Range    Sodium 134 (L) 136 - 145 mmol/L    Potassium 4.1 3.5 - 5.1 mmol/L    Chloride 104 97 - 108 mmol/L    CO2 27 21 - 32 mmol/L    Anion Gap 3 (L) 5 - 15 mmol/L    Glucose 111 (H) 65 - 100 mg/dL    BUN 16 6 - 20 mg/dL    Creatinine 9.810.95 1.910.55 - 1.02 mg/dL    Bun/Cre Ratio 17 12 - 20      ESTIMATED GLOMERULAR FILTRATION RATE >60 >60 ml/min/1.8573m2    Calcium 9.1 8.5 - 10.1 mg/dL   CBC    Collection Time: 05/25/21  5:06 AM   Result Value Ref Range    WBC 12.1 (H) 3.6 - 11.0 K/uL    RBC 4.37 3.80 - 5.20 M/uL    Hemoglobin 12.7 11.5 - 16.0 g/dL    Hematocrit 39.6  35.0 - 47.0 %    MCV 90.6 80.0 - 99.0 FL    MCH 29.1 26.0 - 34.0 PG    MCHC 32.1 30.0 - 36.5 g/dL    RDW 53.2 (H) 02.3 - 14.5 %    Platelets 280 150 - 400 K/uL    MPV 8.5 (L) 8.9 - 12.9 FL    Nucleated RBCs 0.0 0.0 PER 100 WBC    NRBC  Absolute 0.00 0.00 - 0.01 K/uL   EKG 12 Lead    Collection Time: 05/25/21  8:18 AM   Result Value Ref Range    Ventricular Rate 121 BPM    Atrial Rate 121 BPM    P-R Interval 158 ms    QRS Duration 64 ms    Q-T Interval 302 ms    QTc Calculation (Bazett) 428 ms    P Axis 48 degrees    R Axis -11 degrees    T Axis 51 degrees    Diagnosis       Sinus tachycardia  Cannot rule out Anterior infarct , age undetermined  Abnormal ECG  No previous ECGs available  Confirmed by Nelson Chimes ([id number]) on 05/25/2021 8:18:33 AM     Culture, Urine    Collection Time: 05/26/21 12:15 PM    Specimen: Urine   Result Value Ref Range    Special Requests No Special Requests      Culture No Growth (<1000 cfu/mL)     Type and Screen    Collection Time: 05/26/21  6:16 PM   Result Value Ref Range    Crossmatch expiration date 05/29/2021,2359     ABO/Rh O Positive     Antibody Screen Negative    CBC    Collection Time: 06/01/21 10:00 AM   Result Value Ref Range    WBC 10.4 3.6 - 11.0 K/uL    RBC 3.32 (L) 3.80 - 5.20 M/uL    Hemoglobin 9.5 (L) 11.5 - 16.0 g/dL    Hematocrit 34.3 (L) 35.0 - 47.0 %    MCV 93.4 80.0 - 99.0 FL    MCH 28.6 26.0 - 34.0 PG    MCHC 30.6 30.0 - 36.5 g/dL    RDW 56.8 (H) 61.6 - 14.5 %    Platelets 434 (H) 150 - 400 K/uL    MPV 8.9 8.9 - 12.9 FL    Nucleated RBCs 0.0 0.0 PER 100 WBC    nRBC 0.00 0.00 - 0.01 K/uL   Basic Metabolic Panel w/ Reflex to MG    Collection Time: 06/01/21 10:00 AM   Result Value Ref Range    Sodium 135 (L) 136 - 145 mmol/L    Potassium 3.7 3.5 - 5.1 mmol/L    Chloride 107 97 - 108 mmol/L    CO2 22 21 - 32 mmol/L    Anion Gap 6 5 - 15 mmol/L    Glucose 131 (H) 65 - 100 mg/dL    BUN 23 (H) 6 - 20 mg/dL    Creatinine 8.37 2.90 - 1.02 mg/dL    Bun/Cre Ratio 29 (H) 12 - 20      Est, Glom Filt Rate >60 >60 ml/min/1.23m2    Calcium 8.5 8.5 - 10.1 mg/dL    XR PELVIS (1-2 VIEWS)    Result Date: 05/26/2021  EXAM: XR PELV AP ONLY INDICATION: . COMPARISON: None. FINDINGS: An AP view  of the pelvis demonstrates  no fracture, lytic or blastic lesion or other abnormality. The soft tissues are normal.  Normal pelvis.    XR PELVIS (1-2 VIEWS)    Result Date: 05/24/2021  EXAM: XR PELV AP ONLY, XR FEMUR LT 2 V INDICATION: Pain. Unable to stand.. COMPARISON: Radiographs 01/26/2021. FINDINGS: An AP view  of the pelvis and 4 views of the left femur demonstrates moderate diffuse osteopenia. There is a left femoral bipolar hemiarthroplasty with noncemented femoral component. There is interval subsidence of the femoral component measuring approximately 15 mm. There are fractures of the greater and lesser trochanters of the left femur. No other femoral fracture is shown. No innominate bone fracture is demonstrated. 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 Extensive atherosclerotic calcifications are noted.     Marked diffuse osteopenia. Left femoral bipolar hemiarthroplasty with interval subsidence measuring 15 mm. Acute appearing fractures of left femoral greater and lesser trochanters.    XR FEMUR LEFT (MIN 2 VIEWS)    Result Date: 05/24/2021  EXAM: XR PELV AP ONLY, XR FEMUR LT 2 V INDICATION: Pain. Unable to stand.. COMPARISON: Radiographs 01/26/2021. FINDINGS: An AP view  of the pelvis and 4 views of the left femur demonstrates moderate diffuse osteopenia. There is a left femoral bipolar hemiarthroplasty with noncemented femoral component. There is interval subsidence of the femoral component measuring approximately 15 mm. There are fractures of the greater and lesser trochanters of the left femur. No other femoral fracture is shown. No innominate bone fracture is demonstrated. 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 Extensive atherosclerotic calcifications are noted.     Marked diffuse osteopenia. Left femoral bipolar hemiarthroplasty with interval subsidence measuring 15 mm. Acute appearing fractures of left femoral greater and lesser  trochanters.    XR KNEE LEFT (3 VIEWS)    Result Date: 05/24/2021  INDICATION:  fall Exam: AP, lateral, oblique views of the left knee. FINDINGS: There is no acute fracture-dislocation. There is marked narrowing of the medial lateral tibiofemoral joint spaces. The osseous structures are diffusely demineralized. No suprapatellar joint fluid is visualized.     No acute fracture or dislocation.      XR CHEST PORTABLE    Result Date: 05/24/2021  EXAM:  XR CHEST PORT INDICATION: Generalized lung crackles COMPARISON: 01/02/2021, CT 01/04/2021 TECHNIQUE: 1922 hours portable chest AP view FINDINGS: Mediastinal and hilar contours are stable with mild thoracic aortic tortuosity again shown. The cardiac silhouette is within normal limits. The pulmonary vasculature is within normal limits. The lungs and pleural spaces are clear. Bones are severely osteopenic.     No acute process on portable chest.     CT Lower Extremity Left WO Contrast    Result Date: 05/25/2021  EXAM: CT LOW EXT LT WO CONT INDICATION: Fall. Fracture. COMPARISON: Radiographs same day TECHNIQUE: Helical CT of the left femur with coronal and sagittal reformats. Images reviewed in soft tissue and bone windows.  CT dose reduction was achieved through the use of a standardized protocol tailored for this examination and automatic exposure control for dose modulation. CONTRAST: None. FINDINGS: Bones: The bones are osteopenic. The patient is status post left hip hemiarthroplasty. No dislocation. There is a periprosthetic fracture in the proximal left femur in the intertrochanteric region. There is mild subsidence. Joint fluid: No significant joint effusion. Articulations: No significant osteoarthritis. No evidence of inflammatory arthritis. Tendons: No full-thickness tendon tear. Muscles: No intramuscular hematoma. No focal atrophy. Soft tissue mass: No mass. There is soft tissue hematoma in the musculature surrounding the left hip.     Status  post left hip  hemiarthroplasty. There is an acute periprosthetic fracture in the intertrochanteric region with mild subsidence.    XR HIP 2-3 VW W PELVIS LEFT    Result Date: 05/26/2021  EXAM: XR HIP LT W OR WO PELV 2-3 VWS INDICATION: hip revision left. COMPARISON: Prior day. FINDINGS: AP view of the pelvis and a frogleg lateral view of the left hip demonstrate a left hip replacement in progress. There is gas and fluid in the adjacent soft tissues. Head of the hip prosthesis is currently absent.     Left hip replacement revision in progress.    Assessment & Plan      12/01/20 echocardiogram    Left Ventricle: Low normal left ventricular systolic function with a visually estimated EF of 50 - 55%. Left ventricle is smaller than normal. Mild septal thickening. Mild posterior thickening. See diagram for wall motion findings. Grade I diastolic dysfunction with normal LAP.    Right Ventricle: Reduced systolic function.    Aortic Valve: Tricuspid valve. Mild sclerosis of the aortic valve cusp. Mild regurgitation.    Mitral Valve: Valve structure is normal. Mild annular calcification at the posterior leaflet of the mitral valve. Mild to moderate paravalvular regurgitation.    Right Atrium: Right atrium is mildly dilated.    Technical qualifiers: Echo study was technically difficult due to patient's body habitus.    05/24/21 admission course  [name] is a 76 y.o. female with PMH of COPD, HLP and recent fall with left femur fracture s/p ORIF of the left hip. She presented to the ED with chief complaint of fall and left hip pain. She reports mechanical fall when walking at home today,  was not using walker. She denies LOC, lightheadedness or vertigo prior to fall. No focal neurology symptoms. She report left hip pain, severe in nature, worsened with movement.     In the ED, noted hypertensive. X-ray showed acute fracture of the left femoral greater and lesser trochanters.    Brief Postoperative Note   Patient: [name]  Date of  Birth: 04-28-1945  MRN: [id number]   Date of Procedure: 05/26/2021    Pre-Op Diagnosis: Left hip periprosthetic fracture with loose stem   Post-Op Diagnosis: Same as above   Procedure(s):  LEFT HIP REVISION POSTERIOR APPROACH WITH ALLOGRAFT   Surgeon(s):  Helene Kelp, MD   Surgical Assistant: Surg Asst-1: Jalene Mullet; Larene Pickett   Anesthesia: General    Estimated Blood Loss (mL): 200   Complications: None    05/31/21 orthopedics  Status post removal hardware/ORIF revision left hip hemiarthroplasty.  Postop day #5.  Continue with therapy as tolerated.  Continue with aspirin for DVT prophylaxis.  Will increase tramadol to 50 to 100 mg every 6 hours as needed pain.  Awaiting rehab placement.  Okay to discharge from orthopedics when cleared by medicine and placement has been made.  Patient will follow-up with Dr. Gerda Diss as outpatient on Jun 10, 2021    06/02/21 no new complaints, tolerating medications well  Postop course progressing well  Rehab placement anticipated    MICROBIOLOGY    05/26/21  Urine  Negative    ASSESSMENT AND PLAN    1) Left femur acute fracture s/p operative management as above     Cleared per orthopedics   Physical therapy   Awaiting rehab placement   Outpatient follow up with orthopedics 06/10/21    2) Cardiovascular issues including hypertension and dyslipidemia     Echocardiogram as above  Medical management    Aspirin    Atorvastatin    Metoprolol    3) COPD not in acute exacerbation    4) Dispo IRF anticipated      Electronically signed by [name] Thurston Pounds, MD on 05/31/21 at 10:26 AM EDT

## 2021-06-03 MED FILL — TRAMADOL HCL 50 MG PO TABS: 50 MG | ORAL | Qty: 1

## 2021-06-03 MED FILL — CELECOXIB 100 MG PO CAPS: 100 MG | ORAL | Qty: 1

## 2021-06-03 MED FILL — PANTOPRAZOLE SODIUM 40 MG PO TBEC: 40 MG | ORAL | Qty: 1

## 2021-06-03 MED FILL — SPIRIVA RESPIMAT 2.5 MCG/ACT IN AERS: 2.5 MCG/ACT | RESPIRATORY_TRACT | Qty: 16

## 2021-06-03 MED FILL — METOPROLOL SUCCINATE ER 25 MG PO TB24: 25 MG | ORAL | Qty: 1

## 2021-06-03 MED FILL — ACETAMINOPHEN EXTRA STRENGTH 500 MG PO TABS: 500 MG | ORAL | Qty: 2

## 2021-06-03 MED FILL — SYMBICORT 160-4.5 MCG/ACT IN AERO: 160-4.5 MCG/ACT | RESPIRATORY_TRACT | Qty: 6

## 2021-06-03 MED FILL — MELATONIN 5 MG PO TABS: 5 MG | ORAL | Qty: 1

## 2021-06-03 MED FILL — PEG 3350 17 G PO PACK: 17 g | ORAL | Qty: 1

## 2021-06-03 MED FILL — ASPIRIN LOW STRENGTH 81 MG PO CHEW: 81 MG | ORAL | Qty: 1

## 2021-06-03 MED FILL — ATORVASTATIN CALCIUM 40 MG PO TABS: 40 MG | ORAL | Qty: 1

## 2021-06-03 MED FILL — STIMULANT LAXATIVE 8.6-50 MG PO TABS: ORAL | Qty: 1

## 2021-06-03 NOTE — Plan of Care (Signed)
PHYSICAL THERAPY TREATMENT     Patient: [name] (76 y.o. female)  Date: 06/03/2021  Diagnosis: Femur fracture (HCC) [S72.90XA] <principal problem not specified>      Precautions: Hip Precautions: Posterior hip precautions  In place during session: External Catheter and EKG/telemetry   Chart, physical therapy assessment, plan of care and goals were reviewed.    ASSESSMENT  Patient continues with skilled PT services and is progressing towards goals. Pt supine in bed upon PT arrival, agreeable to session. Pt A&O x 4.  Pt's overall bed mobility is CGA to EOB. Pt requires Mod A sit <> stand. Pt ambulated Min A with RW to the bathroom approximately 16 ft where COTA and PTA assited pt with pericare. Pt ambulated 9 ft back to recliner chair and took a short rest break before ambulating 30 ft in the hallway before sitting in recliner. Pt's gait was slow with bilateral decreased step length, no LOB noted. Currently recommending IRF for discharge. Pt continues to progress in treatment sessions. (See below for objective details and assist levels). Overall pt tolerated session well today with increased mobility. Will continue to benefit from skilled PT services, and will continue to progress as tolerated.     Current Level of Function Impacting Discharge (mobility/balance): Min/Mod A, CGA    Other factors to consider for discharge: poor safety awareness and high risk for falls       PLAN :  Patient continues to benefit from skilled intervention to address decreased activity tolerance. Continue treatment per established plan of care to address goals.    Recommendation for discharge: (in order for the patient to meet his/her long term goals)  Inpatient Rehabilitation Facility     IF patient discharges home will need the following XMI:WOEH       SUBJECTIVE:   Patient stated "I walked more than I thought I would."    OBJECTIVE DATA SUMMARY:   Critical Behavior:  Orientation  Overall Orientation Status: Within Normal  Limits  Orientation Level: Oriented X4  Cognition  Overall Cognitive Status: WNL  Arousal/Alertness: Appropriate responses to stimuli  Following Commands: Follows one step commands with increased time;Follows one step commands with repetition  Memory: Decreased recall of precautions  Safety Judgement: Decreased awareness of need for safety  Insights: Decreased awareness of deficits  Initiation: Requires cues for some  Sequencing: Requires cues for all    Functional Mobility Training:    Bed Mobility:  Bed Mobility Training  Overall Level of Assistance: Contact-guard assistance  Interventions: Verbal cues  Rolling: Contact-guard assistance  Supine to Sit: Contact-guard assistance  Scooting: Contact-guard assistance  Transfers:  Art therapist: Yes  Overall Level of Assistance: Minimum assistance  Interventions: Verbal cues;Tactile cues  Sit to Stand: Minimum assistance  Stand to Sit: Moderate assistance  Bed to Chair: Moderate assistance  Toilet Transfer: Moderate assistance;Additional time  Balance:  Balance  Sitting: Intact  Standing: Impaired  Standing - Static: Constant support;Fair  Standing - Dynamic: Constant support;Fair    Soil scientist Management: No    Ambulation/Gait Training:     Gait  Overall Level of Assistance: Minimum assistance  Interventions: Tactile cues;Verbal cues  Base of Support: Narrowed  Speed/Cadence: Slow  Step Length: Left shortened;Right shortened  Gait Abnormalities: Step to gait  Distance (ft): 55 Feet  Assistive Device: Walker, rolling;Gait belt    Pain Rating:  9/10 with mobility  3-4/10 before movement    Activity Tolerance:   Fair  After treatment patient left in no apparent distress:   Bed locked and returned to lowest position, Patient left in no apparent distress sitting up in chair and Call bell within reach, and nsg updated     COMMUNICATION/COLLABORATION:   The patient's plan of care was discussed with:  Registered nurse    Patient Education  Education Given To: Patient  Education Provided: Precautions;Energy Conservation  Education Method: Verbal;Teach Back  Education Outcome: Verbalized understanding;Continued education needed    Debbra Riding, PTA  Written By: Molinda Bailiff, PTA  Minutes: 40           Problem: Physical Therapy - Adult  Goal: By Discharge: Performs mobility at highest level of function for planned discharge setting.  See evaluation for individualized goals.  Description: Problem: Mobility Impaired (Adult and Pediatric)  Goal: *Acute Goals and Plan of Care (Insert Text)  Description: FUNCTIONAL STATUS PRIOR TO ADMISSION: Patient was independent and active without use of DME.    HOME SUPPORT PRIOR TO ADMISSION: The patient lived alone with no local support.    Physical Therapy Goals  Initiated 05/27/2021  Patient/family stated goal: to get stronger and walk   1. Patient will move from supine to sit and sit to supine in bed with supervision/set-up within 7 day(s).   2. Patient will transfer from bed to chair and chair to bed with supervision/set-up using the least restrictive device within 7 day(s).  3. Patient will perform sit to stand with supervision/set-up within 7 day(s).  4. Patient will ambulate with supervision/set-up for 25 feet with the least restrictive device within 7 day(s).   5. Patient will ascend/descend 4 stairs with B handrail(s) with supervision/set-up within 7 day(s).  6. Patient will participate in lower extremity therapeutic exercise/activities with supervision/set-up for 10 minutes within 7 day(s).     Outcome: Progressing

## 2021-06-03 NOTE — Discharge Instructions (Addendum)
LEAVE DRESSING IN PLACE UNTIL 06/09/2021. MAY LEAVE WOUND SITE UNCOVERED IF THERE IS NO DRAINAGE.     Weightbearing as tolerated OF left lower     Diet- cont current orders  Activity-as per Ortho instructions  CBC and BMP in 1 to 2 weeks  Strict fall and aspiration cautions  Return to ED or call 911 immediately symptoms recur

## 2021-06-03 NOTE — Progress Notes (Signed)
[facility]  [name] Caitlin BilletAkbar Babette Wright [name]    Date:06/03/2021       Room:439/01  Patient Name:[name]     Date of Birth:September 24, 1945     Age:76 y.o.    12/01/20 echocardiogram    Left Ventricle: Low normal left ventricular systolic function with a visually estimated EF of 50 - 55%. Left ventricle is smaller than normal. Mild septal thickening. Mild posterior thickening. See diagram for wall motion findings. Grade I diastolic dysfunction with normal LAP.    Right Ventricle: Reduced systolic function.    Aortic Valve: Tricuspid valve. Mild sclerosis of the aortic valve cusp. Mild regurgitation.    Mitral Valve: Valve structure is normal. Mild annular calcification at the posterior leaflet of the mitral valve. Mild to moderate paravalvular regurgitation.    Right Atrium: Right atrium is mildly dilated.    Technical qualifiers: Echo study was technically difficult due to patient's body habitus.    05/24/21 admission course  [name] is a 76 y.o. female with PMH of COPD, HLP and recent fall with left femur fracture s/p ORIF of the left hip. She presented to the ED with chief complaint of fall and left hip pain. She reports mechanical fall when walking at home today,  was not using walker. She denies LOC, lightheadedness or vertigo prior to fall. No focal neurology symptoms. She report left hip pain, severe in nature, worsened with movement.     In the ED, noted hypertensive. X-ray showed acute fracture of the left femoral greater and lesser trochanters.    Brief Postoperative Note   Patient: [name]  Date of Birth: September 24, 1945  MRN: [id number]   Date of Procedure: 05/26/2021    Pre-Op Diagnosis: Left hip periprosthetic fracture with loose stem   Post-Op Diagnosis: Same as above   Procedure(s):  LEFT HIP REVISION POSTERIOR APPROACH WITH ALLOGRAFT   Surgeon(s):  [name], Eric, [name]   Surgical Assistant: [name], [name]; [name], [name]   Anesthesia: General    Estimated Blood Loss (mL): 200    Complications: None    05/31/21 orthopedics  Status post removal hardware/ORIF revision left hip hemiarthroplasty.  Postop day #5.  Continue with therapy as tolerated.  Continue with aspirin for DVT prophylaxis.  Will increase tramadol to 50 to 100 mg every 6 hours as needed pain.  Awaiting rehab placement.  Okay to discharge from orthopedics when cleared by medicine and placement has been made.  Patient will follow-up with Dr. Gerda DissGrenier as outpatient on Jun 10, 2021    06/03/21 no new complaints, tolerating medications well  Postop course progressing well  Rehab vs SNF placement anticipated    Subjective    Subjective:  Symptoms:  Stable.     Review of Systems   All other systems reviewed and are negative.  Objective         Vitals Last 24 Hours:  TEMPERATURE:  Temp  Avg: 98 F (36.7 C)  Min: 97.6 F (36.4 C)  Max: 98.2 F (36.8 C)  RESPIRATIONS RANGE: Resp  Avg: 16.5  Min: 16  Max: 18  PULSE OXIMETRY RANGE: SpO2  Avg: 97.6 %  Min: 97 %  Max: 98 %  PULSE RANGE: Pulse  Avg: 81.9  Min: 72  Max: 93  BLOOD PRESSURE RANGE: Systolic (24hrs), Avg:118 , Min:105 , Max:130   ; Diastolic (24hrs), Avg:69, Min:60, Max:81    I/O (24Hr):  No intake or output data in the 24 hours ending 06/03/21 0745  Vitals:    06/02/21 1958 06/02/21 2345 06/03/21 0023 06/03/21 0600   BP:  105/60  109/63   Pulse: 83 80  75   Resp: Temp:  97.6 F (36.4 C)  98.2 F (36.8 C)   TempSrc:  Oral  Oral   SpO2: 97%      Weight:       Height:          Objective:  General Appearance:  Comfortable.    Vital signs: (most recent): Blood pressure 109/63, pulse 75, temperature 98.2 F (36.8 C), temperature source Oral, resp. rate 16, height 1.6 m (5' 2.99"), weight 51.7 kg (113 lb 15.7 oz), SpO2 97 %.    General: NAD, pleasant and cooperative   HEENT: anicteric, moist oral mucosa   CV: RRR, no murmurs, 2+ pulses   Respiratory: clear and aerating well, no increased WOB  Abdomen: nondistended, nontender  Extremities: no peripheral edema    Musculoskeletal: moving extremities at least antigravity, L DROM, dressing in place with pico drain  Neuro: alert, normal speech, CN 2-12 intact, sensation intact to light touch     Labs/Imaging/Diagnostics    Labs:  CBC:  Recent Labs     06/01/21  1000   WBC 10.4   RBC 3.32*   HGB 9.5*   HCT 31.0*   MCV 93.4   RDW 15.4*   PLT 434*       CHEMISTRIES:  Recent Labs     06/01/21  1000   NA 135*   K 3.7   CL 107   CO2 22   BUN 23*   CREATININE 0.80   GLUCOSE 131*       PT/INR:No results for input(s): PROTIME, INR in the last 72 hours.  APTT:No results for input(s): APTT in the last 72 hours.  LIVER PROFILE:No results for input(s): AST, ALT, BILIDIR, BILITOT, ALKPHOS in the last 72 hours.    Imaging Last 24 Hours:  No results found.  Assessment//Plan           Recent Results (from the past 200 hour(s))   Culture, Urine    Collection Time: 05/26/21 12:15 PM    Specimen: Urine   Result Value Ref Range    Special Requests No Special Requests      Culture No Growth (<1000 cfu/mL)     Type and Screen    Collection Time: 05/26/21  6:16 PM   Result Value Ref Range    Crossmatch expiration date 05/29/2021,2359     ABO/Rh O Positive     Antibody Screen Negative    CBC    Collection Time: 06/01/21 10:00 AM   Result Value Ref Range    WBC 10.4 3.6 - 11.0 K/uL    RBC 3.32 (L) 3.80 - 5.20 M/uL    Hemoglobin 9.5 (L) 11.5 - 16.0 g/dL    Hematocrit 16.1 (L) 35.0 - 47.0 %    MCV 93.4 80.0 - 99.0 FL    MCH 28.6 26.0 - 34.0 PG    MCHC 30.6 30.0 - 36.5 g/dL    RDW 09.6 (H) 04.5 - 14.5 %    Platelets 434 (H) 150 - 400 K/uL    MPV 8.9 8.9 - 12.9 FL    Nucleated RBCs 0.0 0.0 PER 100 WBC    nRBC 0.00 0.00 - 0.01 K/uL   Basic Metabolic Panel w/ Reflex to MG    Collection Time: 06/01/21 10:00 AM   Result Value  Ref Range    Sodium 135 (L) 136 - 145 mmol/L    Potassium 3.7 3.5 - 5.1 mmol/L    Chloride 107 97 - 108 mmol/L    CO2 22 21 - 32 mmol/L    Anion Gap 6 5 - 15 mmol/L    Glucose 131 (H) 65 - 100 mg/dL    BUN 23 (H) 6 - 20 mg/dL     Creatinine 2.02 5.42 - 1.02 mg/dL    Bun/Cre Ratio 29 (H) 12 - 20      Est, Glom Filt Rate >60 >60 ml/min/1.72m2    Calcium 8.5 8.5 - 10.1 mg/dL    XR PELVIS (1-2 VIEWS)    Result Date: 05/26/2021  EXAM: XR PELV AP ONLY INDICATION: . COMPARISON: None. FINDINGS: An AP view  of the pelvis demonstrates no fracture, lytic or blastic lesion or other abnormality. The soft tissues are normal.     Normal pelvis.    XR PELVIS (1-2 VIEWS)    Result Date: 05/24/2021  EXAM: XR PELV AP ONLY, XR FEMUR LT 2 V INDICATION: Pain. Unable to stand.. COMPARISON: Radiographs 01/26/2021. FINDINGS: An AP view  of the pelvis and 4 views of the left femur demonstrates moderate diffuse osteopenia. There is a left femoral bipolar hemiarthroplasty with noncemented femoral component. There is interval subsidence of the femoral component measuring approximately 15 mm. There are fractures of the greater and lesser trochanters of the left femur. No other femoral fracture is shown. No innominate bone fracture is demonstrated. 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 Extensive atherosclerotic calcifications are noted.     Marked diffuse osteopenia. Left femoral bipolar hemiarthroplasty with interval subsidence measuring 15 mm. Acute appearing fractures of left femoral greater and lesser trochanters.    XR FEMUR LEFT (MIN 2 VIEWS)    Result Date: 05/24/2021  EXAM: XR PELV AP ONLY, XR FEMUR LT 2 V INDICATION: Pain. Unable to stand.. COMPARISON: Radiographs 01/26/2021. FINDINGS: An AP view  of the pelvis and 4 views of the left femur demonstrates moderate diffuse osteopenia. There is a left femoral bipolar hemiarthroplasty with noncemented femoral component. There is interval subsidence of the femoral component measuring approximately 15 mm. There are fractures of the greater and lesser trochanters of the left femur. No other femoral fracture is shown. No innominate bone fracture is demonstrated. There is moderate  right hip osteoarthrosis and mild bilateral SI joint osteoarthrosis. Extensive atherosclerotic calcifications are noted.     Marked diffuse osteopenia. Left femoral bipolar hemiarthroplasty with interval subsidence measuring 15 mm. Acute appearing fractures of left femoral greater and lesser trochanters.    XR KNEE LEFT (3 VIEWS)    Result Date: 05/24/2021  INDICATION:  fall Exam: AP, lateral, oblique views of the left knee. FINDINGS: There is no acute fracture-dislocation. There is marked narrowing of the medial lateral tibiofemoral joint spaces. The osseous structures are diffusely demineralized. No suprapatellar joint fluid is visualized.     No acute fracture or dislocation.      XR CHEST PORTABLE    Result Date: 05/24/2021  EXAM:  XR CHEST PORT INDICATION: Generalized lung crackles COMPARISON: 01/02/2021, CT 01/04/2021 TECHNIQUE: 1922 hours portable chest AP view FINDINGS: Mediastinal and hilar contours are stable with mild thoracic aortic tortuosity again shown. The cardiac silhouette is within normal limits. The pulmonary vasculature is within normal limits. The lungs and pleural spaces are clear. Bones are severely osteopenic.     No acute process on portable chest.  CT Lower Extremity Left WO Contrast    Result Date: 05/25/2021  EXAM: CT LOW EXT LT WO CONT INDICATION: Fall. Fracture. COMPARISON: Radiographs same day TECHNIQUE: Helical CT of the left femur with coronal and sagittal reformats. Images reviewed in soft tissue and bone windows.  CT dose reduction was achieved through the use of a standardized protocol tailored for this examination and automatic exposure control for dose modulation. CONTRAST: None. FINDINGS: Bones: The bones are osteopenic. The patient is status post left hip hemiarthroplasty. No dislocation. There is a periprosthetic fracture in the proximal left femur in the intertrochanteric region. There is mild subsidence. Joint fluid: No significant joint effusion. Articulations: No  significant osteoarthritis. No evidence of inflammatory arthritis. Tendons: No full-thickness tendon tear. Muscles: No intramuscular hematoma. No focal atrophy. Soft tissue mass: No mass. There is soft tissue hematoma in the musculature surrounding the left hip.     Status post left hip hemiarthroplasty. There is an acute periprosthetic fracture in the intertrochanteric region with mild subsidence.    XR HIP 2-3 VW W PELVIS LEFT    Result Date: 05/26/2021  EXAM: XR HIP LT W OR WO PELV 2-3 VWS INDICATION: hip revision left. COMPARISON: Prior day. FINDINGS: AP view of the pelvis and a frogleg lateral view of the left hip demonstrate a left hip replacement in progress. There is gas and fluid in the adjacent soft tissues. Head of the hip prosthesis is currently absent.     Left hip replacement revision in progress.    Assessment & Plan      12/01/20 echocardiogram    Left Ventricle: Low normal left ventricular systolic function with a visually estimated EF of 50 - 55%. Left ventricle is smaller than normal. Mild septal thickening. Mild posterior thickening. See diagram for wall motion findings. Grade I diastolic dysfunction with normal LAP.    Right Ventricle: Reduced systolic function.    Aortic Valve: Tricuspid valve. Mild sclerosis of the aortic valve cusp. Mild regurgitation.    Mitral Valve: Valve structure is normal. Mild annular calcification at the posterior leaflet of the mitral valve. Mild to moderate paravalvular regurgitation.    Right Atrium: Right atrium is mildly dilated.    Technical qualifiers: Echo study was technically difficult due to patient's body habitus.    05/24/21 admission course  Chaeli Judy is a 76 y.o. female with PMH of COPD, HLP and recent fall with left femur fracture s/p ORIF of the left hip. She presented to the ED with chief complaint of fall and left hip pain. She reports mechanical fall when walking at home today,  was not using walker. She denies LOC, lightheadedness or vertigo prior  to fall. No focal neurology symptoms. She report left hip pain, severe in nature, worsened with movement.     In the ED, noted hypertensive. X-ray showed acute fracture of the left femoral greater and lesser trochanters.    Brief Postoperative Note   Patient: Edra Riccardi  Date of Birth: [birth year]  MRN: [id number]   Date of Procedure: 05/26/2021    Pre-Op Diagnosis: Left hip periprosthetic fracture with loose stem   Post-Op Diagnosis: Same as above   Procedure(s):  LEFT HIP REVISION POSTERIOR APPROACH WITH ALLOGRAFT   Surgeon(s):  Caitlin Kelp, [name]   Surgical Assistant: Surg Asst-1: Caitlin Mullet; Caitlin Pickett   Anesthesia: General    Estimated Blood Loss (mL): 200   Complications: None    05/31/21 orthopedics  Status post removal hardware/ORIF revision left hip hemiarthroplasty.  Postop day #5.  Continue with therapy as tolerated.  Continue with aspirin for DVT prophylaxis.  Will increase tramadol to 50 to 100 mg every 6 hours as needed pain.  Awaiting rehab placement.  Okay to discharge from orthopedics when cleared by medicine and placement has been made.  Patient will follow-up with Dr. Gerda Diss as outpatient on Jun 10, 2021    06/03/21 no new complaints, tolerating medications well  Postop course progressing well  Rehab vs SNF placement anticipated    MICROBIOLOGY    05/26/21  Urine  Negative    ASSESSMENT AND PLAN    1) Left femur acute fracture s/p operative management as above     Cleared per orthopedics   Physical therapy   Awaiting rehab placement   Outpatient follow up with orthopedics 06/10/21    2) Cardiovascular issues including hypertension and dyslipidemia     Echocardiogram as above   Medical management    Aspirin    Atorvastatin    Metoprolol    3) COPD not in acute exacerbation    4) Dispo IRF vs SNF anticipated      Electronically signed by Geoffry Bannister [name], [name] on 05/31/21 at 10:26 AM EDT

## 2021-06-03 NOTE — Progress Notes (Signed)
Patient arrived to floor. Report received

## 2021-06-03 NOTE — Plan of Care (Signed)
OCCUPATIONAL THERAPY TREATMENT  Patient: [name] (76 y.o. female)  Date: 06/03/2021  Primary Diagnosis: Femur fracture (HCC) [S72.90XA]       Precautions:               Hip Precautions: Posterior hip precautions  In place during session: External Catheter and EKG/telemetry   Chart, occupational therapy assessment, plan of care, and goals were reviewed.    ASSESSMENT  Patient continues with skilled OT services and is progressing towards goals. Pt semi supine in bed upon OT arrival, agreeable to session. Pt A&O x 4. (See below for objective details and assist levels).  Overall pt tolerated session fair today with completion of bed mobility, transfers, ADLs, and ambulation.  Pt completed bed mobility with CGA to EOB and able to complete sit>stand from EOB with ModA overall.  Pt completed ambulation with MinA overall using RW to bathroom and able to complete transfers on and off with MinA x2.  Pt required significant increased time for completion of bowel hygiene with ModA overall.  Pt then required [place] for thoroughness of hand hygiene post peri care.  Donning/doffing of clean socks completed with total A.  Once returned to recliner pt required MinA for donning of clean gown.  Pt able to increase ambulation into hallway this session post peri care and rest break.  Pt left seated in recliner with call bell within reach and needs met. Will continue to benefit from skilled OT services, and will continue to progress as tolerated.     Other factors to consider for discharge: high risk for falls, not safe to be alone, and concern for safely navigating or managing the home environment        PLAN :  Patient continues to benefit from skilled intervention to address the above impairments.  Continue treatment per established plan of care to address goals.    Recommend with staff: Out of bed to chair for meals and Encourage HEP in prep for ADLs/mobility    Recommend next OT session: Toileting, LB dressing, and standing  grooming    Recommendation for discharge: (in order for the patient to meet his/her long term goals): Inpatient Rehabilitation Facility     IF patient discharges home will need the following DME: TBD       SUBJECTIVE:   Patient stated "My bottom is so sore."    OBJECTIVE DATA SUMMARY:   Cognitive/Behavioral Status:  Orientation  Overall Orientation Status: Within Normal Limits  Orientation Level: Oriented X4  Cognition  Overall Cognitive Status: WNL  Arousal/Alertness: Appropriate responses to stimuli  Following Commands: Follows one step commands with increased time;Follows one step commands with repetition  Memory: Decreased recall of precautions  Safety Judgement: Decreased awareness of need for safety  Insights: Decreased awareness of deficits  Initiation: Requires cues for some  Sequencing: Requires cues for all    Functional Mobility and Transfers for ADLs:  Bed Mobility:  Bed Mobility Training  Overall Level of Assistance: Contact-guard assistance  Interventions: Verbal cues  Rolling: Contact-guard assistance  Supine to Sit: Contact-guard assistance  Scooting: Contact-guard assistance    Transfers:  Pharmacologist: Yes  Overall Level of Assistance: Minimum assistance  Interventions: Verbal cues;Tactile cues  Sit to Stand: Minimum assistance  Stand to Sit: Moderate assistance  Bed to Chair: Moderate assistance  Toilet Transfer: Moderate assistance;Additional time    Balance:  Balance  Sitting: Intact  Standing: Impaired  Standing - Static: Constant support;Fair  Standing - Dynamic: Constant  support;Fair    ADL Intervention:  Grooming: Moderate assistance   Hand Hygiene seated    UE Dressing: Minimal assistance  Gown seated    LE Dressing: Dependent/Total  Socks seated    Toileting: Moderate assistance  Bowel hygiene standing    Pain Rating:  3-4/10 pain at rest in L hip  9/10 pain with mobility in L hip     Activity Tolerance:   Fair  and requires rest breaks    After treatment patient  left in no apparent distress:   Bed locked and returned to lowest position, Patient left in no apparent distress sitting up in chair and Call bell within reach, and nsg updated     COMMUNICATION/EDUCATION:   The patient's plan of care was discussed with: Physical therapy assistant, Registered nurse, and Ortho PA    Patient Education  Education Given To: Patient  Education Provided: Role of Therapy;Energy Printmaker;Fall Prevention Strategies  Education Method: Verbal;Teach Back  Education Outcome: Verbalized understanding;Demonstrated understanding    PT/OT sessions occurred together for increased safety of pt and clinician.    Thank you for this referral.  Milinda Sweeney, OTA  Minutes: 42    Problem: Occupational Therapy - Adult  Goal: By Discharge: Performs self-care activities at highest level of function for planned discharge setting.  See evaluation for individualized goals.  Description: Description: FUNCTIONAL STATUS PRIOR TO ADMISSION: Pt states she was ind with ADLs with no AD PTA.     HOME SUPPORT: Lives alone    Occupational Therapy Goals  Initiated 05/27/2021    Pt stated goal: To return to PLOF  Pt will be Mod I sup <> sit in prep for EOB ADLs  Pt will be Mod I grooming standing sink side LRAD  Pt will be Mod I UB dressing sitting EOB/long sit   Pt will be Mod I LE dressing sitting EOB/long sit  Pt will be Mod I sit <> stand in prep for toileting LRAD  Pt will be Mod I toileting/toilet transfer/cloth mgmt LRAD    Outcome: Progressing

## 2021-06-03 NOTE — Progress Notes (Signed)
Orthopedic progress note    Date:06/03/2021       Room:439/01  Patient Name:[name]     Date of Birth:[birth year]     Age:76 y.o.      Subjective    That is post removal hardware/ORIF revision left hemiarthroplasty.  Postop day #8.  Patient doing extremely well.  Pain is well controlled.  Continues to progress well with therapy.  No other complaints at this time.  Objective           Vitals Last 24 Hours:  TEMPERATURE:  Temp  Avg: 98 F (36.7 C)  Min: 97.6 F (36.4 C)  Max: 98.2 F (36.8 C)  RESPIRATIONS RANGE: Resp  Avg: 16.5  Min: 16  Max: 18  PULSE OXIMETRY RANGE: SpO2  Avg: 97.6 %  Min: 97 %  Max: 98 %  PULSE RANGE: Pulse  Avg: 81.9  Min: 72  Max: 93  BLOOD PRESSURE RANGE: Systolic (24hrs), Avg:118 , Min:105 , Max:130   ; Diastolic (24hrs), Avg:69, Min:60, Max:81    Current Facility-Administered Medications   Medication Dose Route Frequency    melatonin tablet 5 mg  5 mg Oral Nightly PRN    acetaminophen (TYLENOL) tablet 1,000 mg  1,000 mg Oral Q6H    albuterol sulfate HFA (PROVENTIL;VENTOLIN;PROAIR) 108 (90 Base) MCG/ACT inhaler 2 puff  2 puff Inhalation Q4H PRN    aspirin chewable tablet 81 mg  81 mg Oral BID    atorvastatin (LIPITOR) tablet 40 mg  40 mg Oral Nightly    budesonide-formoterol (SYMBICORT) 160-4.5 MCG/ACT inhaler 2 puff  2 puff Inhalation BID    And    tiotropium (SPIRIVA RESPIMAT) 2.5 MCG/ACT inhaler 2 puff  2 puff Inhalation Daily    celecoxib (CELEBREX) capsule 100 mg  100 mg Oral BID    metoprolol succinate (TOPROL XL) extended release tablet 25 mg  25 mg Oral Daily    ondansetron (ZOFRAN-ODT) disintegrating tablet 4 mg  4 mg Oral Q6H PRN    Or    ondansetron (ZOFRAN) injection 4 mg  4 mg IntraVENous Q6H PRN    pantoprazole (PROTONIX) tablet 40 mg  40 mg Oral QAM AC    polyethylene glycol (GLYCOLAX) packet 17 g  17 g Oral Daily    sennosides-docusate sodium (SENOKOT-S) 8.6-50 MG tablet 1 tablet  1 tablet Oral BID    sodium chloride flush 0.9 % injection 5-40 mL  5-40 mL IntraVENous 2  times per day    sodium chloride flush 0.9 % injection 5-40 mL  5-40 mL IntraVENous PRN    0.9 % sodium chloride infusion   IntraVENous PRN    traMADol (ULTRAM) tablet 50 mg  50 mg Oral Q6H PRN          I/O (24Hr):  No intake or output data in the 24 hours ending 06/03/21 0746  Objective:  Vital signs: (most recent): Blood pressure 109/63, pulse 75, temperature 98.2 F (36.8 C), temperature source Oral, resp. rate 16, height 1.6 m (5' 2.99"), weight 51.7 kg (113 lb 15.7 oz), SpO2 97 %.    Labs/Imaging/Diagnostics    Labs:  CBC:  Recent Labs     06/01/21  1000   WBC 10.4   RBC 3.32*   HGB 9.5*   HCT 31.0*   MCV 93.4   RDW 15.4*   PLT 434*     CHEMISTRIES:  Recent Labs     06/01/21  1000   NA 135*   K 3.7   CL  107   CO2 22   BUN 23*   GLUCOSE 131*   PT/INR:No results for input(s): PROTIME, INR in the last 72 hours.  APTT:No results for input(s): APTT in the last 72 hours.  LIVER PROFILE:No results for input(s): AST, ALT, BILIDIR, BILITOT, ALKPHOS in the last 72 hours.  Lab Results   Component Value Date/Time    ALT 16 01/28/2021 08:50 AM    AST 22 01/28/2021 08:50 AM       Physical Exam:  Left hip: Aquacel Ag dressings clean dry and intact.  No tenderness palpation the left thigh.  No calf pain.    Assessment//Plan           Patient Active Problem List    Diagnosis Date Noted    Femur fracture (HCC) 05/24/2021    Closed fracture of neck of left femur (HCC) 01/26/2021    Hip fracture (HCC) 01/25/2021    Sepsis (HCC) 01/02/2021    Cardiac syncope 11/27/2020    Ambulatory dysfunction 11/27/2020     Status post removal hardware/ORIF revision left hemiarthroplasty postop day #8  Continue with therapy.  Exercises encouraged.  Hemoglobin stable.  Aspirin for DVT prophylaxis.  Awaiting rehab placement.  For discharge from orthopedics when cleared by medicine and placement has been made.  Follow-up with Dr. Gerda Diss as outpatient on Jun 10, 2021.      Electronically signed by Tommi Rumps, PA-C on 06/03/2021 at 7:46 AM

## 2021-06-03 NOTE — Care Coordination-Inpatient (Addendum)
CM met with patient to get choice for SNF, as her insurance denied auth for IRF. She gave choice for [facility]. Choice letter completed and referrals sent via [facility]. CM will continue to follow.

## 2021-06-04 MED FILL — PANTOPRAZOLE SODIUM 40 MG PO TBEC: 40 MG | ORAL | Qty: 1

## 2021-06-04 MED FILL — PEG 3350 17 G PO PACK: 17 g | ORAL | Qty: 1

## 2021-06-04 MED FILL — CELECOXIB 100 MG PO CAPS: 100 MG | ORAL | Qty: 1

## 2021-06-04 MED FILL — STIMULANT LAXATIVE 8.6-50 MG PO TABS: ORAL | Qty: 1

## 2021-06-04 MED FILL — TRAMADOL HCL 50 MG PO TABS: 50 MG | ORAL | Qty: 1

## 2021-06-04 MED FILL — ACETAMINOPHEN EXTRA STRENGTH 500 MG PO TABS: 500 MG | ORAL | Qty: 2

## 2021-06-04 MED FILL — METOPROLOL SUCCINATE ER 25 MG PO TB24: 25 MG | ORAL | Qty: 1

## 2021-06-04 MED FILL — SPIRIVA RESPIMAT 2.5 MCG/ACT IN AERS: 2.5 MCG/ACT | RESPIRATORY_TRACT | Qty: 4

## 2021-06-04 MED FILL — SYMBICORT 160-4.5 MCG/ACT IN AERO: 160-4.5 MCG/ACT | RESPIRATORY_TRACT | Qty: 6

## 2021-06-04 MED FILL — ATORVASTATIN CALCIUM 40 MG PO TABS: 40 MG | ORAL | Qty: 1

## 2021-06-04 MED FILL — ASPIRIN LOW STRENGTH 81 MG PO CHEW: 81 MG | ORAL | Qty: 1

## 2021-06-04 NOTE — Plan of Care (Signed)
OCCUPATIONAL THERAPY TREATMENT  Patient: [name] (76 y.o. female)  Date: 06/04/2021  Primary Diagnosis: Femur fracture (HCC) [S72.90XA]       Precautions:               Hip Precautions: Posterior hip precautions  In place during session: Peripheral IV and External Catheter  Chart, occupational therapy assessment, plan of care, and goals were reviewed.       ASSESSMENT  Patient continues with skilled OT services and is progressing towards goals. Pt supine in bed upon OT arrival, agreeable to session. Pt A&O x 4 and able to follow 100% of simple commands. (See below for objective details and assist levels). Overall pt tolerated session EOB and OOB activity today with seated/standing rest breaks as needed. Pt tolerated performing grooming tasks standing at sink, but continues to require setup A for grooming and mod A for balance as pt lost balance posteriorly multiple times and unable to correct without significant A, despite cueing. Pt would benefit from and can tolerate three hours of therapy daily. Pt remains appropriate for d/c to IRF when medically stable. Will continue to benefit from skilled OT services, and will continue to progress as tolerated.     Other factors to consider for discharge: lives alone, no support system, and high risk for falls        PLAN :  Patient continues to benefit from skilled intervention to address the above impairments.  Continue treatment per established plan of care to address goals.    Recommend with staff: Out of bed to chair for meals and Amb to bathroom for toileting with gt belt and AD    Recommend next OT session: Toileting and LB dressing    Recommendation for discharge: (in order for the patient to meet his/her long term goals): Inpatient Rehabilitation Facility     IF patient discharges home will need the following DME: bedside commode and shower chair       SUBJECTIVE:   Patient stated "I don't know why this leg is sore today. I did okay yesterday."    OBJECTIVE DATA  SUMMARY:   Cognitive/Behavioral Status:  Orientation  Overall Orientation Status: Within Normal Limits  Orientation Level: Oriented X4  Cognition  Overall Cognitive Status: WNL  Arousal/Alertness: Appropriate responses to stimuli  Following Commands: Follows one step commands with increased time  Attention Span: Appears intact  Memory: Appears intact  Safety Judgement:  (Decreased safety awareness)  Insights: Decreased awareness of deficits  Initiation: Requires cues for some  Sequencing: Requires cues for some    Functional Mobility and Transfers for ADLs:  Bed Mobility:  Bed Mobility Training  Bed Mobility Training: Yes  Overall Level of Assistance: Minimum assistance  Interventions: Demonstration  Rolling: Supervision  Supine to Sit: Moderate assistance;Additional time  Sit to Supine: Moderate assistance;Additional time  Scooting: Moderate assistance;Additional time    Transfers:  Art therapist: No  Overall Level of Assistance: Minimum assistance  Interventions: Safety awareness training;Verbal cues;Tactile cues  Sit to Stand: Moderate assistance;Additional time  Stand to Sit: Minimum assistance  Stand Pivot Transfers: Moderate assistance;Additional time      Balance:  Balance  Sitting: Intact  Sitting - Static: Good (unsupported)  Sitting - Dynamic: Fair (occasional)  Standing - Static: Fair  Standing - Dynamic: Poor      ADL Intervention:  Grooming: Minimal assistance;Increased time to complete       UE Dressing: Minimal assistance    LE Dressing: Maximum assistance  Pain Rating:  6/10   Pain Intervention(s):   nursing notified, repositioning, and increased activity    Activity Tolerance:   Fair  and requires rest breaks  Goals:   Problem: Occupational Therapy - Adult  Goal: By Discharge: Performs self-care activities at highest level of function for planned discharge setting.  See evaluation for individualized goals.  Description: Description: FUNCTIONAL STATUS PRIOR TO ADMISSION: Pt  states she was ind with ADLs with no AD PTA.     HOME SUPPORT: Lives alone    Occupational Therapy Goals  Initiated 05/27/2021    Pt stated goal: To return to PLOF  Pt will be Mod I sup <> sit in prep for EOB ADLs  Pt will be Mod I grooming standing sink side LRAD  Pt will be Mod I UB dressing sitting EOB/long sit   Pt will be Mod I LE dressing sitting EOB/long sit  Pt will be Mod I sit <> stand in prep for toileting LRAD  Pt will be Mod I toileting/toilet transfer/cloth mgmt LRAD    Outcome: Progressing     After treatment patient left in no apparent distress:   Bed locked and returned to lowest position, Patient left in no apparent distress sitting up in bed, Call bell within reach, and Side rails x3, and nsg updated     COMMUNICATION/EDUCATION:   The patient's plan of care was discussed with: Physical therapy assistant and Registered nurse      Patient Education  Education Given To: Patient  Education Provided: Transfer Training;Plan of Care  Education Method: Demonstration;Verbal  Barriers to Learning: None  Education Outcome: Verbalized understanding;Continued education needed    Thank you for this referral.  Lucile Crater, OTA  Minutes: 28

## 2021-06-04 NOTE — Plan of Care (Signed)
PHYSICAL THERAPY TREATMENT     Patient: [name] (76 y.o. female)  Date: 06/04/2021  Diagnosis: Femur fracture (HCC) [S72.90XA] <principal problem not specified>      Precautions:                Hip Precautions: Posterior hip precautions  In place during session: External Catheter and EKG/telemetry   Chart, physical therapy assessment, plan of care and goals were reviewed.    ASSESSMENT  Patient continues with skilled PT services and is progressing towards goals. Pt semi supine in bed upon PT arrival, agreeable to session. Pt A&O x 3.  At this time patient reporting increased hip/knee pain for LLE, and requesting to not walk today. Semi supine and seated LE TE Performed with decreased ROM due to pain. Performs bed mobility and transfer with Min a. (See below for objective details and assist levels).     Overall pt tolerated session fair today with Bed mobility, transfer, LE TE.  Will continue to benefit from skilled PT services, and will continue to progress as tolerated.     Current Level of Function Impacting Discharge (mobility/balance): COGNITION, ENDURANCE, STRENGTH, BALANCE    Other factors to consider for discharge: poor safety awareness, impaired cognition/dementia, and high risk for falls       PLAN :  Patient continues to benefit from skilled intervention to address impaired functional mobility. Continue treatment per established plan of care to address goals.    Recommend with staff: Out of bed to chair for meals and Encourage HEP in prep for ADLs/mobility    Recommendation for discharge: (in order for the patient to meet his/her long term goals)  Inpatient Rehabilitation Facility     IF patient discharges home will need the following DXI:PJASNKN walker       SUBJECTIVE:   Patient stated "MY HIP HURTS."    OBJECTIVE DATA SUMMARY:   Critical Behavior:  Orientation  Overall Orientation Status: Within Normal Limits  Cognition  Overall Cognitive Status: WNL  Arousal/Alertness: Appropriate  responses to stimuli  Following Commands: Follows all commands without difficulty  Attention Span: Appears intact  Memory: Appears intact  Safety Judgement: Decreased awareness of need for assistance  Insights: Decreased awareness of deficits  Initiation: Requires cues for some  Sequencing: Requires cues for some    Functional Mobility Training:  Bed Mobility:  Bed Mobility Training  Bed Mobility Training: Yes  Overall Level of Assistance: Minimum assistance  Interventions: Demonstration  Rolling: Supervision  Supine to Sit: Minimum assistance  Sit to Supine: Supervision  Scooting: Supervision  Transfers:  Art therapist: No  Overall Level of Assistance: Minimum assistance  Balance:  Balance  Sitting: Intact  Wheelchair Mobility:  Wheelchair Management  Wheelchair Management: No  Ambulation/Gait Training:            Therapeutic Exercises:       EXERCISE   Sets   Reps   Active Active Assist   Passive Self ROM   Comments   Ankle Pumps  5 MIN [x]  []  []  []     Quad Sets/Glut Sets  3 MIN [x]  []  []  []     Hamstring Sets   []  []  []  []     Short Arc Quads  3 MIN [x]  []  []  []     Heel Slides   []  []  []  []     Straight Leg Raises   []  []  []  []     Hip abd/add  5 MIN [x]  []  []  []   Long Arc Quads  2 MIN [x]  []  []  []     Marching   []  []  []  []        []  []  []  []        Pain Rating:  7/10   Pain Intervention(s):   rest, elevation, and repositioning    Activity Tolerance:   Fair     After treatment patient left in no apparent distress:   Bed locked and returned to lowest position, Patient left in no apparent distress sitting up in bed, Call bell within reach, and Side rails x3, and nsg updated     GOALS:    Problem: Physical Therapy - Adult  Goal: By Discharge: Performs mobility at highest level of function for planned discharge setting.  See evaluation for individualized goals.  Description: Problem: Mobility Impaired (Adult and Pediatric)  Goal: *Acute Goals and Plan of Care (Insert Text)  Description: FUNCTIONAL  STATUS PRIOR TO ADMISSION: Patient was independent and active without use of DME.    HOME SUPPORT PRIOR TO ADMISSION: The patient lived alone with no local support.    Physical Therapy Goals  Initiated 05/27/2021  Patient/family stated goal: to get stronger and walk   1. Patient will move from supine to sit and sit to supine in bed with supervision/set-up within 7 day(s).   2. Patient will transfer from bed to chair and chair to bed with supervision/set-up using the least restrictive device within 7 day(s).  3. Patient will perform sit to stand with supervision/set-up within 7 day(s).  4. Patient will ambulate with supervision/set-up for 25 feet with the least restrictive device within 7 day(s).   5. Patient will ascend/descend 4 stairs with B handrail(s) with supervision/set-up within 7 day(s).  6. Patient will participate in lower extremity therapeutic exercise/activities with supervision/set-up for 10 minutes within 7 day(s).     Outcome: Progressing       COMMUNICATION/COLLABORATION:   The patient's plan of care was discussed with: Physical therapy assistant    Patient Education  Education Given To: Patient  Education Provided: Home Exercise Program;Energy Conservation;Fall Prevention Strategies  Education Method: Demonstration  Barriers to Learning: Cognition  Education Outcome: Verbalized understanding          , PTA  Minutes: 33

## 2021-06-04 NOTE — Progress Notes (Signed)
Patient with BP 96/57. Patient BP during the day in the low 100s for SBP. MD Nardone contacted via [facility]. Recent BP and hemoglobin labs reviewed with MD Nardone. Orders received for one time dose of NS 500 cc bolus and CBC w/auto differential. Per MD Nardone, okay to still give aspirin.

## 2021-06-04 NOTE — Progress Notes (Signed)
[facility] Hospitalist Progress Note  Phillippe Orlick Dena Billet MD    Date:06/04/2021       Room:501/02  Patient Name:[name]     Date of Birth:15-Jan-1946     Age:76 y.o.    12/01/20 echocardiogram    Left Ventricle: Low normal left ventricular systolic function with a visually estimated EF of 50 - 55%. Left ventricle is smaller than normal. Mild septal thickening. Mild posterior thickening. See diagram for wall motion findings. Grade I diastolic dysfunction with normal LAP.    Right Ventricle: Reduced systolic function.    Aortic Valve: Tricuspid valve. Mild sclerosis of the aortic valve cusp. Mild regurgitation.    Mitral Valve: Valve structure is normal. Mild annular calcification at the posterior leaflet of the mitral valve. Mild to moderate paravalvular regurgitation.    Right Atrium: Right atrium is mildly dilated.    Technical qualifiers: Echo study was technically difficult due to patient's body habitus.    05/24/21 admission course  [name] is a 76 y.o. female with PMH of COPD, HLP and recent fall with left femur fracture s/p ORIF of the left hip. She presented to the ED with chief complaint of fall and left hip pain. She reports mechanical fall when walking at home today,  was not using walker. She denies LOC, lightheadedness or vertigo prior to fall. No focal neurology symptoms. She report left hip pain, severe in nature, worsened with movement.     In the ED, noted hypertensive. X-ray showed acute fracture of the left femoral greater and lesser trochanters.    Brief Postoperative Note   Patient: [name]  Date of Birth: 1945/02/04  MRN: [id number]   Date of Procedure: 05/26/2021    Pre-Op Diagnosis: Left hip periprosthetic fracture with loose stem   Post-Op Diagnosis: Same as above   Procedure(s):  LEFT HIP REVISION POSTERIOR APPROACH WITH ALLOGRAFT   Surgeon(s):  Helene Kelp, MD   Surgical Assistant: Surg Asst-1: Jalene Mullet; Larene Pickett   Anesthesia: General    Estimated Blood Loss (mL): 200    Complications: None    05/31/21 orthopedics  Status post removal hardware/ORIF revision left hip hemiarthroplasty.  Postop day #5.  Continue with therapy as tolerated.  Continue with aspirin for DVT prophylaxis.  Will increase tramadol to 50 to 100 mg every 6 hours as needed pain.  Awaiting rehab placement.  Okay to discharge from orthopedics when cleared by medicine and placement has been made.  Patient will follow-up with Dr. Gerda Diss as outpatient on Jun 10, 2021    06/04/21 no new complaints, tolerating medications well  Postop course progressing well  Rehab vs SNF placement anticipated    Subjective    Subjective:  Symptoms:  Stable.     Review of Systems   All other systems reviewed and are negative.  Objective         Vitals Last 24 Hours:  TEMPERATURE:  Temp  Avg: 97.9 F (36.6 C)  Min: 97.8 F (36.6 C)  Max: 98.1 F (36.7 C)  RESPIRATIONS RANGE: Resp  Avg: 16.7  Min: 16  Max: 18  PULSE OXIMETRY RANGE: SpO2  Avg: 96 %  Min: 95 %  Max: 97 %  PULSE RANGE: Pulse  Avg: 81.8  Min: 71  Max: 89  BLOOD PRESSURE RANGE: Systolic (24hrs), Avg:98 , Min:93 , Max:102   ; Diastolic (24hrs), Avg:65, Min:61, Max:68    I/O (24Hr):    Intake/Output Summary (Last 24 hours) at 06/04/2021 1053  Last data  filed at 06/03/2021 1325  Gross per 24 hour   Intake --   Output 250 ml   Net -250 ml       Vitals:    06/03/21 1952 06/03/21 2057 06/04/21 0228 06/04/21 0821   BP: 96/61  99/66 102/68   Pulse: 87  80 71   Resp: Temp: 98.1 F (36.7 C)  97.8 F (36.6 C) 97.8 F (36.6 C)   TempSrc: Oral  Oral Oral   SpO2: 95%  96% 97%   Weight:       Height:          Objective:  General Appearance:  Comfortable.    Vital signs: (most recent): Blood pressure 102/68, pulse 71, temperature 97.8 F (36.6 C), temperature source Oral, resp. rate 16, height 1.6 m (5' 2.99"), weight 51.7 kg (113 lb 15.7 oz), SpO2 97 %.    General: NAD, pleasant and cooperative   HEENT: anicteric, moist oral mucosa   CV: RRR, no murmurs, 2+ pulses    Respiratory: clear and aerating well, no increased WOB  Abdomen: nondistended, nontender  Extremities: no peripheral edema   Musculoskeletal: moving extremities at least antigravity, L DROM, dressing in place with pico drain  Neuro: alert, normal speech, CN 2-12 intact, sensation intact to light touch     Labs/Imaging/Diagnostics    Labs:  CBC:No results for input(s): WBC, RBC, HGB, HCT, MCV, RDW, PLT in the last 72 hours.    CHEMISTRIES:No results for input(s): NA, K, CL, CO2, BUN, CREATININE, GLUCOSE, CA, PHOS, MG in the last 72 hours.    PT/INR:No results for input(s): PROTIME, INR in the last 72 hours.  APTT:No results for input(s): APTT in the last 72 hours.  LIVER PROFILE:No results for input(s): AST, ALT, BILIDIR, BILITOT, ALKPHOS in the last 72 hours.    Imaging Last 24 Hours:  No results found.  Assessment//Plan           Recent Results (from the past 200 hour(s))   CBC    Collection Time: 06/01/21 10:00 AM   Result Value Ref Range    WBC 10.4 3.6 - 11.0 K/uL    RBC 3.32 (L) 3.80 - 5.20 M/uL    Hemoglobin 9.5 (L) 11.5 - 16.0 g/dL    Hematocrit 16.1 (L) 35.0 - 47.0 %    MCV 93.4 80.0 - 99.0 FL    MCH 28.6 26.0 - 34.0 PG    MCHC 30.6 30.0 - 36.5 g/dL    RDW 09.6 (H) 04.5 - 14.5 %    Platelets 434 (H) 150 - 400 K/uL    MPV 8.9 8.9 - 12.9 FL    Nucleated RBCs 0.0 0.0 PER 100 WBC    nRBC 0.00 0.00 - 0.01 K/uL   Basic Metabolic Panel w/ Reflex to MG    Collection Time: 06/01/21 10:00 AM   Result Value Ref Range    Sodium 135 (L) 136 - 145 mmol/L    Potassium 3.7 3.5 - 5.1 mmol/L    Chloride 107 97 - 108 mmol/L    CO2 22 21 - 32 mmol/L    Anion Gap 6 5 - 15 mmol/L    Glucose 131 (H) 65 - 100 mg/dL    BUN 23 (H) 6 - 20 mg/dL    Creatinine 4.09 8.11 - 1.02 mg/dL    Bun/Cre Ratio 29 (H) 12 - 20      Est, Glom Filt Rate >60 >60 ml/min/1.44m2  Calcium 8.5 8.5 - 10.1 mg/dL    XR PELVIS (1-2 VIEWS)    Result Date: 05/26/2021  EXAM: XR PELV AP ONLY INDICATION: . COMPARISON: None. FINDINGS: An AP view  of the pelvis  demonstrates no fracture, lytic or blastic lesion or other abnormality. The soft tissues are normal.     Normal pelvis.    XR PELVIS (1-2 VIEWS)    Result Date: 05/24/2021  EXAM: XR PELV AP ONLY, XR FEMUR LT 2 V INDICATION: Pain. Unable to stand.. COMPARISON: Radiographs 01/26/2021. FINDINGS: An AP view  of the pelvis and 4 views of the left femur demonstrates moderate diffuse osteopenia. There is a left femoral bipolar hemiarthroplasty with noncemented femoral component. There is interval subsidence of the femoral component measuring approximately 15 mm. There are fractures of the greater and lesser trochanters of the left femur. No other femoral fracture is shown. No innominate bone fracture is demonstrated. 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 Extensive atherosclerotic calcifications are noted.     Marked diffuse osteopenia. Left femoral bipolar hemiarthroplasty with interval subsidence measuring 15 mm. Acute appearing fractures of left femoral greater and lesser trochanters.    XR FEMUR LEFT (MIN 2 VIEWS)    Result Date: 05/24/2021  EXAM: XR PELV AP ONLY, XR FEMUR LT 2 V INDICATION: Pain. Unable to stand.. COMPARISON: Radiographs 01/26/2021. FINDINGS: An AP view  of the pelvis and 4 views of the left femur demonstrates moderate diffuse osteopenia. There is a left femoral bipolar hemiarthroplasty with noncemented femoral component. There is interval subsidence of the femoral component measuring approximately 15 mm. There are fractures of the greater and lesser trochanters of the left femur. No other femoral fracture is shown. No innominate bone fracture is demonstrated. 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 Extensive atherosclerotic calcifications are noted.     Marked diffuse osteopenia. Left femoral bipolar hemiarthroplasty with interval subsidence measuring 15 mm. Acute appearing fractures of left femoral greater and  lesser trochanters.    XR KNEE LEFT (3 VIEWS)    Result Date: 05/24/2021  INDICATION:  fall Exam: AP, lateral, oblique views of the left knee. FINDINGS: There is no acute fracture-dislocation. There is marked narrowing of the medial lateral tibiofemoral joint spaces. The osseous structures are diffusely demineralized. No suprapatellar joint fluid is visualized.     No acute fracture or dislocation.      XR CHEST PORTABLE    Result Date: 05/24/2021  EXAM:  XR CHEST PORT INDICATION: Generalized lung crackles COMPARISON: 01/02/2021, CT 01/04/2021 TECHNIQUE: 1922 hours portable chest AP view FINDINGS: Mediastinal and hilar contours are stable with mild thoracic aortic tortuosity again shown. The cardiac silhouette is within normal limits. The pulmonary vasculature is within normal limits. The lungs and pleural spaces are clear. Bones are severely osteopenic.     No acute process on portable chest.     CT Lower Extremity Left WO Contrast    Result Date: 05/25/2021  EXAM: CT LOW EXT LT WO CONT INDICATION: Fall. Fracture. COMPARISON: Radiographs same day TECHNIQUE: Helical CT of the left femur with coronal and sagittal reformats. Images reviewed in soft tissue and bone windows.  CT dose reduction was achieved through the use of a standardized protocol tailored for this examination and automatic exposure control for dose modulation. CONTRAST: None. FINDINGS: Bones: The bones are osteopenic. The patient is status post left hip hemiarthroplasty. No dislocation. There is a periprosthetic fracture in the proximal left femur  in the intertrochanteric region. There is mild subsidence. Joint fluid: No significant joint effusion. Articulations: No significant osteoarthritis. No evidence of inflammatory arthritis. Tendons: No full-thickness tendon tear. Muscles: No intramuscular hematoma. No focal atrophy. Soft tissue mass: No mass. There is soft tissue hematoma in the musculature surrounding the left hip.     Status post left hip  hemiarthroplasty. There is an acute periprosthetic fracture in the intertrochanteric region with mild subsidence.    XR HIP 2-3 VW W PELVIS LEFT    Result Date: 05/26/2021  EXAM: XR HIP LT W OR WO PELV 2-3 VWS INDICATION: hip revision left. COMPARISON: Prior day. FINDINGS: AP view of the pelvis and a frogleg lateral view of the left hip demonstrate a left hip replacement in progress. There is gas and fluid in the adjacent soft tissues. Head of the hip prosthesis is currently absent.     Left hip replacement revision in progress.    Assessment & Plan      12/01/20 echocardiogram    Left Ventricle: Low normal left ventricular systolic function with a visually estimated EF of 50 - 55%. Left ventricle is smaller than normal. Mild septal thickening. Mild posterior thickening. See diagram for wall motion findings. Grade I diastolic dysfunction with normal LAP.    Right Ventricle: Reduced systolic function.    Aortic Valve: Tricuspid valve. Mild sclerosis of the aortic valve cusp. Mild regurgitation.    Mitral Valve: Valve structure is normal. Mild annular calcification at the posterior leaflet of the mitral valve. Mild to moderate paravalvular regurgitation.    Right Atrium: Right atrium is mildly dilated.    Technical qualifiers: Echo study was technically difficult due to patient's body habitus.    05/24/21 admission course  Caitlin MoccasinJoyce Benassi is a 76 y.o. female with PMH of COPD, HLP and recent fall with left femur fracture s/p ORIF of the left hip. She presented to the ED with chief complaint of fall and left hip pain. She reports mechanical fall when walking at home today,  was not using walker. She denies LOC, lightheadedness or vertigo prior to fall. No focal neurology symptoms. She report left hip pain, severe in nature, worsened with movement.     In the ED, noted hypertensive. X-ray showed acute fracture of the left femoral greater and lesser trochanters.    Brief Postoperative Note   Patient: [name]  Date of  Birth: May 16, 1945  MRN: [id number]   Date of Procedure: 05/26/2021    Pre-Op Diagnosis: Left hip periprosthetic fracture with loose stem   Post-Op Diagnosis: Same as above   Procedure(s):  LEFT HIP REVISION POSTERIOR APPROACH WITH ALLOGRAFT   Surgeon(s):  Helene KelpGrenier, Eric, MD   Surgical Assistant: Surg Asst-1: Jalene MulletFellone, Jacob; Larene PickettLacy, Terry Robert   Anesthesia: General    Estimated Blood Loss (mL): 200   Complications: None    05/31/21 orthopedics  Status post removal hardware/ORIF revision left hip hemiarthroplasty.  Postop day #5.  Continue with therapy as tolerated.  Continue with aspirin for DVT prophylaxis.  Will increase tramadol to 50 to 100 mg every 6 hours as needed pain.  Awaiting rehab placement.  Okay to discharge from orthopedics when cleared by medicine and placement has been made.  Patient will follow-up with Dr. Gerda DissGrenier as outpatient on Jun 10, 2021    06/04/21 no new complaints, tolerating medications well  Postop course progressing well  Rehab vs SNF placement anticipated    MICROBIOLOGY    05/26/21  Urine  Negative  ASSESSMENT AND PLAN    1) Left femur acute fracture s/p operative management as above     Cleared per orthopedics   Physical therapy   Awaiting rehab placement   Outpatient follow up with orthopedics 06/10/21    2) Cardiovascular issues including hypertension and dyslipidemia     Echocardiogram as above   Medical management    Aspirin    Atorvastatin    Metoprolol    3) COPD not in acute exacerbation    4) Dispo IRF vs SNF anticipated      Electronically signed by Shaniya Tashiro Dena Billet, MD on 05/31/21 at 10:26 AM EDT

## 2021-06-05 MED ORDER — SODIUM CHLORIDE 0.9 % IV BOLUS
0.9 % | Freq: Once | INTRAVENOUS | Status: AC
Start: 2021-06-05 — End: 2021-06-04
  Administered 2021-06-05: 02:00:00 500 mL via INTRAVENOUS

## 2021-06-05 MED FILL — TRAMADOL HCL 50 MG PO TABS: 50 MG | ORAL | Qty: 1

## 2021-06-05 MED FILL — SODIUM CHLORIDE 0.9 % IV SOLN: 0.9 % | INTRAVENOUS | Qty: 500

## 2021-06-05 MED FILL — ASPIRIN LOW STRENGTH 81 MG PO CHEW: 81 MG | ORAL | Qty: 1

## 2021-06-05 MED FILL — MELATONIN 5 MG PO TABS: 5 MG | ORAL | Qty: 1

## 2021-06-05 MED FILL — STIMULANT LAXATIVE 8.6-50 MG PO TABS: ORAL | Qty: 1

## 2021-06-05 MED FILL — ATORVASTATIN CALCIUM 40 MG PO TABS: 40 MG | ORAL | Qty: 1

## 2021-06-05 MED FILL — CELECOXIB 100 MG PO CAPS: 100 MG | ORAL | Qty: 1

## 2021-06-05 MED FILL — ACETAMINOPHEN EXTRA STRENGTH 500 MG PO TABS: 500 MG | ORAL | Qty: 2

## 2021-06-05 MED FILL — PEG 3350 17 G PO PACK: 17 g | ORAL | Qty: 1

## 2021-06-05 MED FILL — METOPROLOL SUCCINATE ER 25 MG PO TB24: 25 MG | ORAL | Qty: 1

## 2021-06-05 MED FILL — SYMBICORT 160-4.5 MCG/ACT IN AERO: 160-4.5 MCG/ACT | RESPIRATORY_TRACT | Qty: 6

## 2021-06-05 MED FILL — PANTOPRAZOLE SODIUM 40 MG PO TBEC: 40 MG | ORAL | Qty: 1

## 2021-06-05 NOTE — Progress Notes (Signed)
SBAR/Beside report received from Imani Riley, RN

## 2021-06-05 NOTE — Plan of Care (Signed)
Problem: Pain  Goal: Verbalizes/displays adequate comfort level or baseline comfort level  Outcome: Progressing     Problem: Safety - Adult  Goal: Free from fall injury  Outcome: Progressing     Problem: Safety - Adult  Goal: Free from fall injury  Outcome: Progressing

## 2021-06-05 NOTE — Progress Notes (Signed)
[facility] Hospitalist Progress Note  [name] Caitlin Billet MD    Date:06/05/2021       Room:501/02  Patient Name:[name]     Date of Birth:[birth year]     Age:76 y.o.    12/01/20 echocardiogram    Left Ventricle: Low normal left ventricular systolic function with a visually estimated EF of 50 - 55%. Left ventricle is smaller than normal. Mild septal thickening. Mild posterior thickening. See diagram for wall motion findings. Grade I diastolic dysfunction with normal LAP.    Right Ventricle: Reduced systolic function.    Aortic Valve: Tricuspid valve. Mild sclerosis of the aortic valve cusp. Mild regurgitation.    Mitral Valve: Valve structure is normal. Mild annular calcification at the posterior leaflet of the mitral valve. Mild to moderate paravalvular regurgitation.    Right Atrium: Right atrium is mildly dilated.    Technical qualifiers: Echo study was technically difficult due to patient's body habitus.    05/24/21 admission course  [name] is a 76 y.o. female with PMH of COPD, HLP and recent fall with left femur fracture s/p ORIF of the left hip. She presented to the ED with chief complaint of fall and left hip pain. She reports mechanical fall when walking at home today,  was not using walker. She denies LOC, lightheadedness or vertigo prior to fall. No focal neurology symptoms. She report left hip pain, severe in nature, worsened with movement.     In the ED, noted hypertensive. X-ray showed acute fracture of the left femoral greater and lesser trochanters.    Brief Postoperative Note   Patient: [name]  Date of Birth: [birth year]  MRN: [id number]   Date of Procedure: 05/26/2021    Pre-Op Diagnosis: Left hip periprosthetic fracture with loose stem   Post-Op Diagnosis: Same as above   Procedure(s):  LEFT HIP REVISION POSTERIOR APPROACH WITH ALLOGRAFT   Surgeon(s):  Caitlin Kelp, MD   Surgical Assistant: [name]   Anesthesia: General    Estimated Blood Loss (mL): 200    Complications: None    05/31/21 orthopedics  Status post removal hardware/ORIF revision left hip hemiarthroplasty.  Postop day #5.  Continue with therapy as tolerated.  Continue with aspirin for DVT prophylaxis.  Will increase tramadol to 50 to 100 mg every 6 hours as needed pain.  Awaiting rehab placement.  Okay to discharge from orthopedics when cleared by medicine and placement has been made.  Patient will follow-up with Dr. Gerda Wright as outpatient on Jun 10, 2021    06/05/21 no new complaints, tolerating medications well  Postop course progressing well  Rehab vs SNF placement anticipated    Subjective    Subjective:  Symptoms:  Stable.     Review of Systems   All other systems reviewed and are negative.  Objective         Vitals Last 24 Hours:  TEMPERATURE:  Temp  Avg: 97.6 F (36.4 C)  Min: 97.3 F (36.3 C)  Max: 97.8 F (36.6 C)  RESPIRATIONS RANGE: Resp  Avg: 16.9  Min: 16  Max: 20  PULSE OXIMETRY RANGE: SpO2  Avg: 94.8 %  Min: 93 %  Max: 97 %  PULSE RANGE: Pulse  Avg: 81.7  Min: 73  Max: 88  BLOOD PRESSURE RANGE: Systolic (24hrs), Avg:101 , Min:90 , Max:120   ; Diastolic (24hrs), Avg:63, Min:55, Max:73    I/O (24Hr):    Intake/Output Summary (Last 24 hours) at 06/05/2021 1054  Last data  filed at 06/05/2021 0855  Gross per 24 hour   Intake 600 ml   Output 1475 ml   Net -875 ml       Vitals:    06/04/21 2212 06/04/21 2300 06/05/21 0234 06/05/21 0700   BP:  99/63 (!) 99/57 120/73   Pulse: 84  83 81   Resp: 16  18 20    Temp:   97.7 F (36.5 C) 97.7 F (36.5 C)   TempSrc:   Oral Oral   SpO2:   97% 93%   Weight:       Height:          Objective:  General Appearance:  Comfortable.    Vital signs: (most recent): Blood pressure 120/73, pulse 81, temperature 97.7 F (36.5 C), temperature source Oral, resp. rate 20, height 1.6 m (5' 2.99"), weight 51.7 kg (113 lb 15.7 oz), SpO2 93 %.    General: NAD, pleasant and cooperative   HEENT: anicteric, moist oral mucosa   CV: RRR, no murmurs, 2+ pulses   Respiratory: clear  and aerating well, no increased WOB  Abdomen: nondistended, nontender  Extremities: no peripheral edema   Musculoskeletal: moving extremities at least antigravity, L DROM, dressing in place with pico drain  Neuro: alert, normal speech, CN 2-12 intact, sensation intact to light touch     Labs/Imaging/Diagnostics    Labs:  CBC:No results for input(s): WBC, RBC, HGB, HCT, MCV, RDW, PLT in the last 72 hours.    CHEMISTRIES:No results for input(s): NA, K, CL, CO2, BUN, CREATININE, GLUCOSE, CA, PHOS, MG in the last 72 hours.    PT/INR:No results for input(s): PROTIME, INR in the last 72 hours.  APTT:No results for input(s): APTT in the last 72 hours.  LIVER PROFILE:No results for input(s): AST, ALT, BILIDIR, BILITOT, ALKPHOS in the last 72 hours.    Imaging Last 24 Hours:  No results found.  Assessment//Plan           Recent Results (from the past 200 hour(s))   CBC    Collection Time: 06/01/21 10:00 AM   Result Value Ref Range    WBC 10.4 3.6 - 11.0 K/uL    RBC 3.32 (L) 3.80 - 5.20 M/uL    Hemoglobin 9.5 (L) 11.5 - 16.0 g/dL    Hematocrit 65.731.0 (L) 35.0 - 47.0 %    MCV 93.4 80.0 - 99.0 FL    MCH 28.6 26.0 - 34.0 PG    MCHC 30.6 30.0 - 36.5 g/dL    RDW 84.615.4 (H) 96.211.5 - 14.5 %    Platelets 434 (H) 150 - 400 K/uL    MPV 8.9 8.9 - 12.9 FL    Nucleated RBCs 0.0 0.0 PER 100 WBC    nRBC 0.00 0.00 - 0.01 K/uL   Basic Metabolic Panel w/ Reflex to MG    Collection Time: 06/01/21 10:00 AM   Result Value Ref Range    Sodium 135 (L) 136 - 145 mmol/L    Potassium 3.7 3.5 - 5.1 mmol/L    Chloride 107 97 - 108 mmol/L    CO2 22 21 - 32 mmol/L    Anion Gap 6 5 - 15 mmol/L    Glucose 131 (H) 65 - 100 mg/dL    BUN 23 (H) 6 - 20 mg/dL    Creatinine 9.520.80 8.410.55 - 1.02 mg/dL    Bun/Cre Ratio 29 (H) 12 - 20      Est, Glom Filt Rate >60 >60 ml/min/1.4873m2  Calcium 8.5 8.5 - 10.1 mg/dL    XR PELVIS (1-2 VIEWS)    Result Date: 05/26/2021  EXAM: XR PELV AP ONLY INDICATION: . COMPARISON: None. FINDINGS: An AP view  of the pelvis demonstrates no fracture,  lytic or blastic lesion or other abnormality. The soft tissues are normal.     Normal pelvis.    XR PELVIS (1-2 VIEWS)    Result Date: 05/24/2021  EXAM: XR PELV AP ONLY, XR FEMUR LT 2 V INDICATION: Pain. Unable to stand.. COMPARISON: Radiographs 01/26/2021. FINDINGS: An AP view  of the pelvis and 4 views of the left femur demonstrates moderate diffuse osteopenia. There is a left femoral bipolar hemiarthroplasty with noncemented femoral component. There is interval subsidence of the femoral component measuring approximately 15 mm. There are fractures of the greater and lesser trochanters of the left femur. No other femoral fracture is shown. No innominate bone fracture is demonstrated. 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 Extensive atherosclerotic calcifications are noted.     Marked diffuse osteopenia. Left femoral bipolar hemiarthroplasty with interval subsidence measuring 15 mm. Acute appearing fractures of left femoral greater and lesser trochanters.    XR FEMUR LEFT (MIN 2 VIEWS)    Result Date: 05/24/2021  EXAM: XR PELV AP ONLY, XR FEMUR LT 2 V INDICATION: Pain. Unable to stand.. COMPARISON: Radiographs 01/26/2021. FINDINGS: An AP view  of the pelvis and 4 views of the left femur demonstrates moderate diffuse osteopenia. There is a left femoral bipolar hemiarthroplasty with noncemented femoral component. There is interval subsidence of the femoral component measuring approximately 15 mm. There are fractures of the greater and lesser trochanters of the left femur. No other femoral fracture is shown. No innominate bone fracture is demonstrated. 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 Extensive atherosclerotic calcifications are noted.     Marked diffuse osteopenia. Left femoral bipolar hemiarthroplasty with interval subsidence measuring 15 mm. Acute appearing fractures of left femoral greater and lesser trochanters.    XR  KNEE LEFT (3 VIEWS)    Result Date: 05/24/2021  INDICATION:  fall Exam: AP, lateral, oblique views of the left knee. FINDINGS: There is no acute fracture-dislocation. There is marked narrowing of the medial lateral tibiofemoral joint spaces. The osseous structures are diffusely demineralized. No suprapatellar joint fluid is visualized.     No acute fracture or dislocation.      XR CHEST PORTABLE    Result Date: 05/24/2021  EXAM:  XR CHEST PORT INDICATION: Generalized lung crackles COMPARISON: 01/02/2021, CT 01/04/2021 TECHNIQUE: 1922 hours portable chest AP view FINDINGS: Mediastinal and hilar contours are stable with mild thoracic aortic tortuosity again shown. The cardiac silhouette is within normal limits. The pulmonary vasculature is within normal limits. The lungs and pleural spaces are clear. Bones are severely osteopenic.     No acute process on portable chest.     CT Lower Extremity Left WO Contrast    Result Date: 05/25/2021  EXAM: CT LOW EXT LT WO CONT INDICATION: Fall. Fracture. COMPARISON: Radiographs same day TECHNIQUE: Helical CT of the left femur with coronal and sagittal reformats. Images reviewed in soft tissue and bone windows.  CT dose reduction was achieved through the use of a standardized protocol tailored for this examination and automatic exposure control for dose modulation. CONTRAST: None. FINDINGS: Bones: The bones are osteopenic. The patient is status post left hip hemiarthroplasty. No dislocation. There is a periprosthetic fracture in the proximal left femur  in the intertrochanteric region. There is mild subsidence. Joint fluid: No significant joint effusion. Articulations: No significant osteoarthritis. No evidence of inflammatory arthritis. Tendons: No full-thickness tendon tear. Muscles: No intramuscular hematoma. No focal atrophy. Soft tissue mass: No mass. There is soft tissue hematoma in the musculature surrounding the left hip.     Status post left hip hemiarthroplasty. There is an  acute periprosthetic fracture in the intertrochanteric region with mild subsidence.    XR HIP 2-3 VW W PELVIS LEFT    Result Date: 05/26/2021  EXAM: XR HIP LT W OR WO PELV 2-3 VWS INDICATION: hip revision left. COMPARISON: Prior day. FINDINGS: AP view of the pelvis and a frogleg lateral view of the left hip demonstrate a left hip replacement in progress. There is gas and fluid in the adjacent soft tissues. Head of the hip prosthesis is currently absent.     Left hip replacement revision in progress.    Assessment & Plan      12/01/20 echocardiogram    Left Ventricle: Low normal left ventricular systolic function with a visually estimated EF of 50 - 55%. Left ventricle is smaller than normal. Mild septal thickening. Mild posterior thickening. See diagram for wall motion findings. Grade I diastolic dysfunction with normal LAP.    Right Ventricle: Reduced systolic function.    Aortic Valve: Tricuspid valve. Mild sclerosis of the aortic valve cusp. Mild regurgitation.    Mitral Valve: Valve structure is normal. Mild annular calcification at the posterior leaflet of the mitral valve. Mild to moderate paravalvular regurgitation.    Right Atrium: Right atrium is mildly dilated.    Technical qualifiers: Echo study was technically difficult due to patient's body habitus.    05/24/21 admission course  [name] is a 76 y.o. female with PMH of COPD, HLP and recent fall with left femur fracture s/p ORIF of the left hip. She presented to the ED with chief complaint of fall and left hip pain. She reports mechanical fall when walking at home today,  was not using walker. She denies LOC, lightheadedness or vertigo prior to fall. No focal neurology symptoms. She report left hip pain, severe in nature, worsened with movement.     In the ED, noted hypertensive. X-ray showed acute fracture of the left femoral greater and lesser trochanters.    Brief Postoperative Note   Patient: [name]  Date of Birth: 10-12-1945  MRN: [id number]    Date of Procedure: 05/26/2021    Pre-Op Diagnosis: Left hip periprosthetic fracture with loose stem   Post-Op Diagnosis: Same as above   Procedure(s):  LEFT HIP REVISION POSTERIOR APPROACH WITH ALLOGRAFT   Surgeon(s):  Caitlin Kelp, MD   Surgical Assistant: [name]   Anesthesia: General    Estimated Blood Loss (mL): 200   Complications: None    05/31/21 orthopedics  Status post removal hardware/ORIF revision left hip hemiarthroplasty.  Postop day #5.  Continue with therapy as tolerated.  Continue with aspirin for DVT prophylaxis.  Will increase tramadol to 50 to 100 mg every 6 hours as needed pain.  Awaiting rehab placement.  Okay to discharge from orthopedics when cleared by medicine and placement has been made.  Patient will follow-up with Dr. Gerda Wright as outpatient on Jun 10, 2021    06/05/21 no new complaints, tolerating medications well  Postop course progressing well  Rehab vs SNF placement anticipated    MICROBIOLOGY    05/26/21  Urine  Negative  ASSESSMENT AND PLAN    1) Left femur acute fracture s/p operative management as above     Cleared per orthopedics   Physical therapy   Awaiting rehab placement   Outpatient follow up with orthopedics 06/10/21    2) Cardiovascular issues including hypertension and dyslipidemia     Echocardiogram as above   Medical management    Aspirin    Atorvastatin    Metoprolol    3) COPD not in acute exacerbation    4) Dispo IRF vs SNF anticipated      Electronically signed by [name] Caitlin Billet, MD on 05/31/21 at 10:26 AM EDT

## 2021-06-05 NOTE — Plan of Care (Signed)
Problem: Physical Therapy - Adult  Goal: By Discharge: Performs mobility at highest level of function for planned discharge setting.  See evaluation for individualized goals.  Description: Problem: Mobility Impaired (Adult and Pediatric)  Goal: *Acute Goals and Plan of Care (Insert Text)  Description: FUNCTIONAL STATUS PRIOR TO ADMISSION: Patient was independent and active without use of DME.    HOME SUPPORT PRIOR TO ADMISSION: The patient lived alone with no local support.    Physical Therapy Goals  Initiated 05/27/2021  Patient/family stated goal: to get stronger and walk   1. Patient will move from supine to sit and sit to supine in bed with supervision/set-up within 7 day(s).   2. Patient will transfer from bed to chair and chair to bed with supervision/set-up using the least restrictive device within 7 day(s).  3. Patient will perform sit to stand with supervision/set-up within 7 day(s).  4. Patient will ambulate with supervision/set-up for 25 feet with the least restrictive device within 7 day(s).   5. Patient will ascend/descend 4 stairs with B handrail(s) with supervision/set-up within 7 day(s).  6. Patient will participate in lower extremity therapeutic exercise/activities with supervision/set-up for 10 minutes within 7 day(s).     Outcome: Progressing            PHYSICAL THERAPY TREATMENT     Patient: [name] (76 y.o. female)  Date: 06/05/2021  Diagnosis: Femur fracture (HCC) [S72.90XA] <principal problem not specified>      Precautions: Hip Precautions: Posterior hip precautions  In place during session: External Catheter  Chart, physical therapy assessment, plan of care and goals were reviewed.    ASSESSMENT  Patient continues with skilled PT services and is progressing towards goals. Pt semi-supine upon PT arrival, agreeable to session. Co-treat with OT due to level of assist needed for OOB mobility for safety of patient/clinicians and due to decr tolerance to therapy sessions. Pt A&O x 4.  Eager  to sit up and then get into recliner. (See below for objective details and assist levels).     Overall pt tolerated session fair today with transfer to EOB performed and patient worked on dynamic seated balance tasks before getting into recliner. First, rolled in bed to clean patient after BM. MinA to come to sitting EOB with assistance required at LLE. Sit<>stand from raised bed height with minA x  2 and RW, cues for hand placement and sequencing of transfer. Patient requires additional time for transfers due to pain. Will continue to benefit from skilled PT services, and will continue to progress as tolerated.     Current Level of Function Impacting Discharge (mobility/balance): decr mobility, decr strength, decr endurance, high pain levels     Other factors to consider for discharge: lives alone, high risk for falls, not safe to be alone, and concern for safely navigating or managing the home environment       PLAN :  Patient continues to benefit from skilled intervention to address impaired functional mobility, decreased independence in ADLs, impaired strength, decreased activity tolerance, impaired balance, and increased pain levels. Continue treatment per established plan of care to address goals.    Recommend with staff: Out of bed to chair for meals, Encourage HEP in prep for ADLs/mobility, and LE elevation for management of edema    Recommendation for discharge: (in order for the patient to meet his/her long term goals)  Inpatient Rehabilitation Facility     IF patient discharges home will need the following UJW:[id number] belt and rolling walker  SUBJECTIVE:   Patient stated "I need my coffee ASAP."    OBJECTIVE DATA SUMMARY:   Critical Behavior:  Orientation  Overall Orientation Status: Within Normal Limits  Orientation Level: Oriented X4  Cognition  Overall Cognitive Status: WNL  Following Commands: Follows all commands without difficulty  Memory: Appears intact  Safety Judgement: Good awareness of safety  precautions  Problem Solving: Assistance required to identify errors made  Insights: Decreased awareness of deficits  Initiation: Requires cues for some  Sequencing: Requires cues for some    Functional Mobility Training:  Bed Mobility:  Bed Mobility Training  Bed Mobility Training: Yes  Overall Level of Assistance: Minimum assistance;Assist X1  Rolling: Minimum assistance;Assist X1;Additional time  Supine to Sit: Minimum assistance;Assist X1;Additional time  Scooting: Moderate assistance;Assist X1;Additional time  Transfers:  Art therapist: Yes  Overall Level of Assistance: Minimum assistance;Assist X2;Other (comment) (bed height raised)  Interventions: Verbal cues;Tactile cues  Sit to Stand: Minimum assistance;Assist X2  Stand to Sit: Minimum assistance;Assist X2  Stand Pivot Transfers: Minimum assistance;Contact-guard assistance;Assist X2  Bed to Chair: Minimum assistance;Contact-guard assistance;Assist X2  Balance:  Balance  Sitting: Intact  Sitting - Static: Good (unsupported)  Sitting - Dynamic: Fair (occasional)  Standing: Impaired  Standing - Static: Fair;Constant support  Standing - Dynamic: Fair;Constant support  Wheelchair Mobility:  Wheelchair Management  Wheelchair Management: No  Ambulation/Gait Training:     Gait  Overall Level of Assistance: Minimum assistance;Contact-guard assistance;Assist X2  Interventions: Verbal cues;Visual cues;Tactile cues  Base of Support: Widened  Speed/Cadence: Shuffled;Slow  Step Length: Right shortened  Stance: Left decreased  Gait Abnormalities: Antalgic  Distance (ft): 4 Feet  Assistive Device: Gait belt;Walker      Therapeutic Exercises:   Reviewed LE therex with patient once seated in recliner     EXERCISE   Sets   Reps   Active Active Assist   Passive Self ROM   Comments   Ankle Pumps, circles 1 10 [x]  []  []  []     Quad Sets/Glut Sets   []  []  []  []     Hamstring Sets   []  []  []  []     Short Arc Quads   []  []  []  []     Heel Slides   []  []  []  []      Straight Leg Raises   []  []  []  []     Hip abd/add   []  []  []  []     Long Arc Quads   []  []  []  []     Marching   []  []  []  []        []  []  []  []        Pain Rating:  8/10 at rest, LLE      Activity Tolerance:   Fair , requires frequent rest breaks, and SpO2 stable on room air    After treatment patient left in no apparent distress:   Bed locked and returned to lowest position, Patient left in no apparent distress sitting up in chair and Call bell within reach, and nsg updated    COMMUNICATION/COLLABORATION:   The patient's plan of care was discussed with: Physical therapist, Occupational therapy assistant, and Registered nurse    Patient Education  Education Given To: Patient  Education Provided: Role of Therapy;Plan of Care;Home Exercise Program;Precautions  Education Method: Demonstration;Verbal;Teach Back  Education Outcome: Verbalized understanding;Demonstrated understanding      , PTA  Minutes: 40

## 2021-06-05 NOTE — Plan of Care (Signed)
Problem: Pain  Goal: Verbalizes/displays adequate comfort level or baseline comfort level  06/05/2021 0756 by Leonel Ramsay, RN  Outcome: Progressing     Problem: Occupational Therapy - Adult  Goal: By Discharge: Performs self-care activities at highest level of function for planned discharge setting.  See evaluation for individualized goals.  Description: Description: FUNCTIONAL STATUS PRIOR TO ADMISSION: Pt states she was ind with ADLs with no AD PTA.     HOME SUPPORT: Lives alone    Occupational Therapy Goals  Initiated 05/27/2021    Pt stated goal: To return to PLOF  Pt will be Mod I sup <> sit in prep for EOB ADLs  Pt will be Mod I grooming standing sink side LRAD  Pt will be Mod I UB dressing sitting EOB/long sit   Pt will be Mod I LE dressing sitting EOB/long sit  Pt will be Mod I sit <> stand in prep for toileting LRAD  Pt will be Mod I toileting/toilet transfer/cloth mgmt LRAD    Outcome: Progressing     Problem: Physical Therapy - Adult  Goal: By Discharge: Performs mobility at highest level of function for planned discharge setting.  See evaluation for individualized goals.  Description: Problem: Mobility Impaired (Adult and Pediatric)  Goal: *Acute Goals and Plan of Care (Insert Text)  Description: FUNCTIONAL STATUS PRIOR TO ADMISSION: Patient was independent and active without use of DME.    HOME SUPPORT PRIOR TO ADMISSION: The patient lived alone with no local support.    Physical Therapy Goals  Initiated 05/27/2021  Patient/family stated goal: to get stronger and walk   1. Patient will move from supine to sit and sit to supine in bed with supervision/set-up within 7 day(s).   2. Patient will transfer from bed to chair and chair to bed with supervision/set-up using the least restrictive device within 7 day(s).  3. Patient will perform sit to stand with supervision/set-up within 7 day(s).  4. Patient will ambulate with supervision/set-up for 25 feet with the least restrictive device within 7 day(s).   5.  Patient will ascend/descend 4 stairs with B handrail(s) with supervision/set-up within 7 day(s).  6. Patient will participate in lower extremity therapeutic exercise/activities with supervision/set-up for 10 minutes within 7 day(s).     06/05/2021 1022 by Tanna Furry, PTA  Outcome: Progressing     Problem: Safety - Adult  Goal: Free from fall injury  06/05/2021 0756 by Leonel Ramsay, RN  Outcome: Progressing   OCCUPATIONAL THERAPY TREATMENT  Patient: [name] (76 y.o. female)  Date: 06/05/2021  Primary Diagnosis: Femur fracture ([facility]) [S72.90XA]       Precautions:               Hip Precautions: Posterior hip precautions  In place during session: External Catheter  Chart, occupational therapy assessment, plan of care, and goals were reviewed.    ASSESSMENT  Patient continues with skilled OT services and is progressing towards goals. Pt received semi supine in bed upon OT arrival, agreeable to session. Pt A&O x 4. Pt agreeable to getting up with therapy with breakfast entering room upon therapy arrival. Before completing bed mobility, noticed pt to be soiled. Min Ax2 to roll pt to perform max A bowel hygiene supine in bed. Pt then sat EOB to eat breakfast set up A. Once finished, pt completed STS from EOB and ambulated few feet with RW to beside recliner. Educated pt on HEP to complete throughout the day as well as hip precautions with pt  knowing 0/3 when initially asked. Educated pt on need for further therapy intervention once leaving hospital with pt verbalizing understanding. (See below for objective details and assist levels).     Overall pt tolerated session fair today with complaints of 8/0 pain in L hip with movement. Will continue to benefit from skilled OT services, and will continue to progress as tolerated.     Other factors to consider for discharge: lives alone, high risk for falls, and not safe to be alone        PLAN :  Patient continues to benefit from skilled intervention to address the above  impairments.  Continue treatment per established plan of care to address goals.    Recommend with staff: Encourage HEP in prep for ADLs/mobility, Frequent repositioning to prevent skin breakdown, Amb to bathroom for toileting with gt belt and AD, and Use of bed/chair alarm for safety    Recommend next OT session: Toileting    Recommendation for discharge: (in order for the patient to meet his/her long term goals): [facility]     IF patient discharges home will need the following DME: gait belt, hospital bed, and rolling walker       SUBJECTIVE:   Patient stated "I just need to get home."    OBJECTIVE DATA SUMMARY:   Cognitive/Behavioral Status:  Orientation  Overall Orientation Status: Within Normal Limits  Orientation Level: Oriented X4  Cognition  Overall Cognitive Status: WNL  Following Commands: Follows all commands without difficulty  Memory: Appears intact  Safety Judgement: Good awareness of safety precautions  Problem Solving: Assistance required to identify errors made  Insights: Decreased awareness of deficits  Initiation: Requires cues for some  Sequencing: Requires cues for some    Functional Mobility and Transfers for ADLs:  Bed Mobility:  Bed Mobility Training  Bed Mobility Training: Yes  Overall Level of Assistance: Minimum assistance;Assist X1  Rolling: Minimum assistance;Assist X1;Additional time  Supine to Sit: Minimum assistance;Assist X1;Additional time  Scooting: Moderate assistance;Assist X1;Additional time    Transfers:  Art therapist: Yes  Overall Level of Assistance: Minimum assistance;Assist X2;Other (comment) (bed height raised)  Interventions: Verbal cues;Tactile cues  Sit to Stand: Minimum assistance;Assist X2  Stand to Sit: Minimum assistance;Assist X2  Stand Pivot Transfers: Minimum assistance;Contact-guard assistance;Assist X2  Bed to Chair: Minimum assistance;Contact-guard assistance;Assist X2      Balance:  Balance  Sitting:  Intact  Sitting - Static: Good (unsupported)  Sitting - Dynamic: Fair (occasional)  Standing: Impaired  Standing - Static: Fair;Constant support  Standing - Dynamic: Fair;Constant support      ADL Intervention:    Feeding: Setup       LE Dressing: Maximum assistance    Toileting: Maximum assistance      Pain Rating:  8/10   Pain Intervention(s):   nursing notified, elevation, and repositioning    Activity Tolerance:   Fair     After treatment patient left in no apparent distress:   Bed locked and returned to lowest position, Call bell within reach and Heels elevated/podus boots applied for pressure relief, and nsg updated     COMMUNICATION/EDUCATION:   The patient's plan of care was discussed with: Physical therapy assistant and Registered nurse    Patient Education  Education Given To: Patient  Education Provided: Role of Therapy;Home Exercise Program;Precautions;Transfer Training  Education Method: Verbal  Education Outcome: Verbalized understanding    Thank you for this referral.  Randal Buba, OTA  Minutes: 39

## 2021-06-06 MED FILL — METOPROLOL SUCCINATE ER 25 MG PO TB24: 25 MG | ORAL | Qty: 1

## 2021-06-06 MED FILL — SPIRIVA RESPIMAT 2.5 MCG/ACT IN AERS: 2.5 MCG/ACT | RESPIRATORY_TRACT | Qty: 4

## 2021-06-06 MED FILL — PANTOPRAZOLE SODIUM 40 MG PO TBEC: 40 MG | ORAL | Qty: 1

## 2021-06-06 MED FILL — SYMBICORT 160-4.5 MCG/ACT IN AERO: 160-4.5 MCG/ACT | RESPIRATORY_TRACT | Qty: 6

## 2021-06-06 MED FILL — TRAMADOL HCL 50 MG PO TABS: 50 MG | ORAL | Qty: 1

## 2021-06-06 MED FILL — MELATONIN 5 MG PO TABS: 5 MG | ORAL | Qty: 1

## 2021-06-06 MED FILL — PEG 3350 17 G PO PACK: 17 g | ORAL | Qty: 1

## 2021-06-06 MED FILL — ACETAMINOPHEN EXTRA STRENGTH 500 MG PO TABS: 500 MG | ORAL | Qty: 2

## 2021-06-06 MED FILL — CELECOXIB 100 MG PO CAPS: 100 MG | ORAL | Qty: 1

## 2021-06-06 MED FILL — STIMULANT LAXATIVE 8.6-50 MG PO TABS: ORAL | Qty: 1

## 2021-06-06 MED FILL — ASPIRIN LOW STRENGTH 81 MG PO CHEW: 81 MG | ORAL | Qty: 1

## 2021-06-06 MED FILL — ATORVASTATIN CALCIUM 40 MG PO TABS: 40 MG | ORAL | Qty: 1

## 2021-06-06 NOTE — Plan of Care (Signed)
OCCUPATIONAL THERAPY TREATMENT  Patient: [name] (76 y.o. female)  Date: 06/06/2021  Primary Diagnosis: Femur fracture ([place]) [S72.90XA]       Precautions:               Hip Precautions: Posterior hip precautions  In place during session: Peripheral IV and External Catheter  Chart, occupational therapy assessment, plan of care, and goals were reviewed.    ASSESSMENT  Patient continues with skilled OT services and is progressing towards goals. Pt semi supine in bed with PT in room upon OT arrival, agreeable to session. Pt A&O x 4. (See below for objective details and assist levels).     Overall pt tolerated session fair today with completion of bed mobility, transfers, and ADLs.  Pt completed bed mobility with Min/ModA x2 overall and able to complete transfers from EOB with MinA x1.  Transfer to recliner from EOB completed with MinA x2 using RW for balance while standing.  Pt required setup A for self feeding while seated in chair.  Pt completed seated grooming in chair with Min/ModA for thoroughness.  Pt left seated in chair with call bell within reach and needs met. Will continue to benefit from skilled OT services, and will continue to progress as tolerated.     Other factors to consider for discharge: lives alone, available support system works or is unable to provide adequate supervision and the patient would be alone, high risk for falls, not safe to be alone, and concern for safely navigating or managing the home environment        PLAN :  Patient continues to benefit from skilled intervention to address the above impairments.  Continue treatment per established plan of care to address goals.    Recommend with staff: Out of bed to chair for meals, Encourage HEP in prep for ADLs/mobility, and LE elevation for management of edema    Recommend next OT session: Toileting and standing grooming    Recommendation for discharge: (in order for the patient to meet his/her long term goals): [place]    IF patient discharges home will need the following DME: TBD       SUBJECTIVE:   Patient stated "I don't want to stay here anymore, I want to go home."    OBJECTIVE DATA SUMMARY:   Cognitive/Behavioral Status:  Orientation  Orientation Level: Oriented X4  Cognition  Following Commands: Follows multistep commands with increased time  Attention Span: Attends with cues to redirect;Difficulty dividing attention  Insights: Decreased awareness of deficits  Sequencing: Requires cues for some    Functional Mobility and Transfers for ADLs:  Bed Mobility:  Bed Mobility Training  Rolling: Minimum assistance;Assist X2;Moderate assistance  Supine to Sit: Minimum assistance;Assist X2;Moderate assistance  Scooting: Minimum assistance;Assist X2;Moderate assistance    Transfers:  Transfer Training  Sit to Stand: Minimum assistance;Assist X1  Stand to Sit: Minimum assistance;Assist X1  Bed to Chair: Minimum assistance;Assist X2    Balance:  Balance  Sitting: Impaired  Sitting - Static: Good (unsupported)  Sitting - Dynamic: Fair (occasional)  Standing: Impaired  Standing - Static: Fair;Constant support  Standing - Dynamic: Constant support;Fair    ADL Intervention:  Feeding: Setup   Seated food to mouth    Grooming: Minimal assistance;Moderate assistance   Seated hair brushing    Pain Rating:  7/10 L foot and hip pain beginning of session  5/10 L foot and hip pain at end of session     Pain Intervention(s):  nursing notified, ice, rest, and elevation    Activity Tolerance:   Fair  and requires rest breaks    After treatment patient left in no apparent distress:   Bed locked and returned to lowest position, Patient left in no apparent distress sitting up in chair and Call bell within reach, and nsg updated     COMMUNICATION/EDUCATION:   The patient's plan of care was discussed with: Physical therapist, Registered nurse, and Case management    Patient Education  Education Given To: Patient  Education Provided: Role of  Therapy;Transfer Training;Energy Conservation;Precautions  Education Method: Demonstration;Verbal;Teach Back  Education Outcome: Verbalized understanding;Demonstrated understanding;Continued education needed    Thank you for this referral.  Laney Louderback, OTA  Minutes: 28    Problem: Occupational Therapy - Adult  Goal: By Discharge: Performs self-care activities at highest level of function for planned discharge setting.  See evaluation for individualized goals.  Description: Description: FUNCTIONAL STATUS PRIOR TO ADMISSION: Pt states she was ind with ADLs with no AD PTA.     HOME SUPPORT: Lives alone    Occupational Therapy Goals  Initiated 05/27/2021    Pt stated goal: To return to PLOF  Pt will be Mod I sup <> sit in prep for EOB ADLs  Pt will be Mod I grooming standing sink side LRAD  Pt will be Mod I UB dressing sitting EOB/long sit   Pt will be Mod I LE dressing sitting EOB/long sit  Pt will be Mod I sit <> stand in prep for toileting LRAD  Pt will be Mod I toileting/toilet transfer/cloth mgmt LRAD    Outcome: Progressing

## 2021-06-06 NOTE — Progress Notes (Signed)
[facility] Hospitalist Progress Note  Bonita Brindisi Dena Billet MD    Date:06/06/2021       Room:501/02  Patient Name:[name]     Date of Birth:28-Dec-1945     Age:76 y.o.    12/01/20 echocardiogram    Left Ventricle: Low normal left ventricular systolic function with a visually estimated EF of 50 - 55%. Left ventricle is smaller than normal. Mild septal thickening. Mild posterior thickening. See diagram for wall motion findings. Grade I diastolic dysfunction with normal LAP.    Right Ventricle: Reduced systolic function.    Aortic Valve: Tricuspid valve. Mild sclerosis of the aortic valve cusp. Mild regurgitation.    Mitral Valve: Valve structure is normal. Mild annular calcification at the posterior leaflet of the mitral valve. Mild to moderate paravalvular regurgitation.    Right Atrium: Right atrium is mildly dilated.    Technical qualifiers: Echo study was technically difficult due to patient's body habitus.    05/24/21 admission course  Lakeasha Petion is a 76 y.o. female with PMH of COPD, HLP and recent fall with left femur fracture s/p ORIF of the left hip. She presented to the ED with chief complaint of fall and left hip pain. She reports mechanical fall when walking at home today,  was not using walker. She denies LOC, lightheadedness or vertigo prior to fall. No focal neurology symptoms. She report left hip pain, severe in nature, worsened with movement.     In the ED, noted hypertensive. X-ray showed acute fracture of the left femoral greater and lesser trochanters.    Brief Postoperative Note   Patient: Kemora Pinard  Date of Birth: 1945-08-03  MRN: [id number]   Date of Procedure: 05/26/2021    Pre-Op Diagnosis: Left hip periprosthetic fracture with loose stem   Post-Op Diagnosis: Same as above   Procedure(s):  LEFT HIP REVISION POSTERIOR APPROACH WITH ALLOGRAFT   Surgeon(s):  Helene Kelp, MD   Surgical Assistant: Surg Asst-1: Jalene Mullet; Larene Pickett   Anesthesia: General    Estimated Blood Loss (mL): 200    Complications: None    05/31/21 orthopedics  Status post removal hardware/ORIF revision left hip hemiarthroplasty.  Postop day #5.  Continue with therapy as tolerated.  Continue with aspirin for DVT prophylaxis.  Will increase tramadol to 50 to 100 mg every 6 hours as needed pain.  Awaiting rehab placement.  Okay to discharge from orthopedics when cleared by medicine and placement has been made.  Patient will follow-up with Dr. Gerda Diss as outpatient on Jun 10, 2021    06/06/21 no new complaints, tolerating medications well  Postop course progressing well  Rehab vs SNF placement anticipated    Subjective    Subjective:  Symptoms:  Stable.     Review of Systems   All other systems reviewed and are negative.  Objective         Vitals Last 24 Hours:  TEMPERATURE:  Temp  Avg: 97.8 F (36.6 C)  Min: 97.6 F (36.4 C)  Max: 98 F (36.7 C)  RESPIRATIONS RANGE: Resp  Avg: 17.3  Min: 16  Max: 18  PULSE OXIMETRY RANGE: SpO2  Avg: 96 %  Min: 95 %  Max: 97 %  PULSE RANGE: Pulse  Avg: 83.8  Min: 72  Max: 90  BLOOD PRESSURE RANGE: Systolic (24hrs), Avg:109 , Min:106 , Max:113   ; Diastolic (24hrs), Avg:64, Min:61, Max:65    I/O (24Hr):    Intake/Output Summary (Last 24 hours) at 06/06/2021 0935  Last data  filed at 06/06/2021 3086  Gross per 24 hour   Intake 480 ml   Output 101 ml   Net 379 ml       Vitals:    06/05/21 1945 06/05/21 2147 06/06/21 0230 06/06/21 0745   BP: 113/63  108/61 106/65   Pulse: 90 88 80 72   Resp: Temp: 97.7 F (36.5 C)  97.6 F (36.4 C) 97.9 F (36.6 C)   TempSrc: Oral  Oral Oral   SpO2: 97%  95%    Weight:       Height:          Objective:  General Appearance:  Comfortable.    Vital signs: (most recent): Blood pressure 106/65, pulse 72, temperature 97.9 F (36.6 C), temperature source Oral, resp. rate 18, height 1.6 m (5' 2.99"), weight 51.7 kg (113 lb 15.7 oz), SpO2 95 %.    General: NAD, pleasant and cooperative   HEENT: anicteric, moist oral mucosa   CV: RRR, no murmurs, 2+ pulses    Respiratory: clear and aerating well, no increased WOB  Abdomen: nondistended, nontender  Extremities: no peripheral edema   Musculoskeletal: moving extremities at least antigravity, L DROM, dressing in place with pico drain  Neuro: alert, normal speech, CN 2-12 intact, sensation intact to light touch     Labs/Imaging/Diagnostics    Labs:  CBC:No results for input(s): WBC, RBC, HGB, HCT, MCV, RDW, PLT in the last 72 hours.    CHEMISTRIES:No results for input(s): NA, K, CL, CO2, BUN, CREATININE, GLUCOSE, CA, PHOS, MG in the last 72 hours.    PT/INR:No results for input(s): PROTIME, INR in the last 72 hours.  APTT:No results for input(s): APTT in the last 72 hours.  LIVER PROFILE:No results for input(s): AST, ALT, BILIDIR, BILITOT, ALKPHOS in the last 72 hours.    Imaging Last 24 Hours:  No results found.  Assessment//Plan           Recent Results (from the past 200 hour(s))   CBC    Collection Time: 06/01/21 10:00 AM   Result Value Ref Range    WBC 10.4 3.6 - 11.0 K/uL    RBC 3.32 (L) 3.80 - 5.20 M/uL    Hemoglobin 9.5 (L) 11.5 - 16.0 g/dL    Hematocrit 57.8 (L) 35.0 - 47.0 %    MCV 93.4 80.0 - 99.0 FL    MCH 28.6 26.0 - 34.0 PG    MCHC 30.6 30.0 - 36.5 g/dL    RDW 46.9 (H) 62.9 - 14.5 %    Platelets 434 (H) 150 - 400 K/uL    MPV 8.9 8.9 - 12.9 FL    Nucleated RBCs 0.0 0.0 PER 100 WBC    nRBC 0.00 0.00 - 0.01 K/uL   Basic Metabolic Panel w/ Reflex to MG    Collection Time: 06/01/21 10:00 AM   Result Value Ref Range    Sodium 135 (L) 136 - 145 mmol/L    Potassium 3.7 3.5 - 5.1 mmol/L    Chloride 107 97 - 108 mmol/L    CO2 22 21 - 32 mmol/L    Anion Gap 6 5 - 15 mmol/L    Glucose 131 (H) 65 - 100 mg/dL    BUN 23 (H) 6 - 20 mg/dL    Creatinine 5.28 4.13 - 1.02 mg/dL    Bun/Cre Ratio 29 (H) 12 - 20      Est, Glom Filt Rate >60 >60 ml/min/1.79m2  Calcium 8.5 8.5 - 10.1 mg/dL    XR PELVIS (1-2 VIEWS)    Result Date: 05/26/2021  EXAM: XR PELV AP ONLY INDICATION: . COMPARISON: None. FINDINGS: An AP view  of the pelvis  demonstrates no fracture, lytic or blastic lesion or other abnormality. The soft tissues are normal.     Normal pelvis.    XR PELVIS (1-2 VIEWS)    Result Date: 05/24/2021  EXAM: XR PELV AP ONLY, XR FEMUR LT 2 V INDICATION: Pain. Unable to stand.. COMPARISON: Radiographs 01/26/2021. FINDINGS: An AP view  of the pelvis and 4 views of the left femur demonstrates moderate diffuse osteopenia. There is a left femoral bipolar hemiarthroplasty with noncemented femoral component. There is interval subsidence of the femoral component measuring approximately 15 mm. There are fractures of the greater and lesser trochanters of the left femur. No other femoral fracture is shown. No innominate bone fracture is demonstrated. 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 Extensive atherosclerotic calcifications are noted.     Marked diffuse osteopenia. Left femoral bipolar hemiarthroplasty with interval subsidence measuring 15 mm. Acute appearing fractures of left femoral greater and lesser trochanters.    XR FEMUR LEFT (MIN 2 VIEWS)    Result Date: 05/24/2021  EXAM: XR PELV AP ONLY, XR FEMUR LT 2 V INDICATION: Pain. Unable to stand.. COMPARISON: Radiographs 01/26/2021. FINDINGS: An AP view  of the pelvis and 4 views of the left femur demonstrates moderate diffuse osteopenia. There is a left femoral bipolar hemiarthroplasty with noncemented femoral component. There is interval subsidence of the femoral component measuring approximately 15 mm. There are fractures of the greater and lesser trochanters of the left femur. No other femoral fracture is shown. No innominate bone fracture is demonstrated. 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 Extensive atherosclerotic calcifications are noted.     Marked diffuse osteopenia. Left femoral bipolar hemiarthroplasty with interval subsidence measuring 15 mm. Acute appearing fractures of left femoral greater and  lesser trochanters.    XR KNEE LEFT (3 VIEWS)    Result Date: 05/24/2021  INDICATION:  fall Exam: AP, lateral, oblique views of the left knee. FINDINGS: There is no acute fracture-dislocation. There is marked narrowing of the medial lateral tibiofemoral joint spaces. The osseous structures are diffusely demineralized. No suprapatellar joint fluid is visualized.     No acute fracture or dislocation.      XR CHEST PORTABLE    Result Date: 05/24/2021  EXAM:  XR CHEST PORT INDICATION: Generalized lung crackles COMPARISON: 01/02/2021, CT 01/04/2021 TECHNIQUE: 1922 hours portable chest AP view FINDINGS: Mediastinal and hilar contours are stable with mild thoracic aortic tortuosity again shown. The cardiac silhouette is within normal limits. The pulmonary vasculature is within normal limits. The lungs and pleural spaces are clear. Bones are severely osteopenic.     No acute process on portable chest.     CT Lower Extremity Left WO Contrast    Result Date: 05/25/2021  EXAM: CT LOW EXT LT WO CONT INDICATION: Fall. Fracture. COMPARISON: Radiographs same day TECHNIQUE: Helical CT of the left femur with coronal and sagittal reformats. Images reviewed in soft tissue and bone windows.  CT dose reduction was achieved through the use of a standardized protocol tailored for this examination and automatic exposure control for dose modulation. CONTRAST: None. FINDINGS: Bones: The bones are osteopenic. The patient is status post left hip hemiarthroplasty. No dislocation. There is a periprosthetic fracture in the proximal left femur  in the intertrochanteric region. There is mild subsidence. Joint fluid: No significant joint effusion. Articulations: No significant osteoarthritis. No evidence of inflammatory arthritis. Tendons: No full-thickness tendon tear. Muscles: No intramuscular hematoma. No focal atrophy. Soft tissue mass: No mass. There is soft tissue hematoma in the musculature surrounding the left hip.     Status post left hip  hemiarthroplasty. There is an acute periprosthetic fracture in the intertrochanteric region with mild subsidence.    XR HIP 2-3 VW W PELVIS LEFT    Result Date: 05/26/2021  EXAM: XR HIP LT W OR WO PELV 2-3 VWS INDICATION: hip revision left. COMPARISON: Prior day. FINDINGS: AP view of the pelvis and a frogleg lateral view of the left hip demonstrate a left hip replacement in progress. There is gas and fluid in the adjacent soft tissues. Head of the hip prosthesis is currently absent.     Left hip replacement revision in progress.    Assessment & Plan      12/01/20 echocardiogram    Left Ventricle: Low normal left ventricular systolic function with a visually estimated EF of 50 - 55%. Left ventricle is smaller than normal. Mild septal thickening. Mild posterior thickening. See diagram for wall motion findings. Grade I diastolic dysfunction with normal LAP.    Right Ventricle: Reduced systolic function.    Aortic Valve: Tricuspid valve. Mild sclerosis of the aortic valve cusp. Mild regurgitation.    Mitral Valve: Valve structure is normal. Mild annular calcification at the posterior leaflet of the mitral valve. Mild to moderate paravalvular regurgitation.    Right Atrium: Right atrium is mildly dilated.    Technical qualifiers: Echo study was technically difficult due to patient's body habitus.    05/24/21 admission course  Lewis MoccasinJoyce Melder is a 76 y.o. female with PMH of COPD, HLP and recent fall with left femur fracture s/p ORIF of the left hip. She presented to the ED with chief complaint of fall and left hip pain. She reports mechanical fall when walking at home today,  was not using walker. She denies LOC, lightheadedness or vertigo prior to fall. No focal neurology symptoms. She report left hip pain, severe in nature, worsened with movement.     In the ED, noted hypertensive. X-ray showed acute fracture of the left femoral greater and lesser trochanters.    Brief Postoperative Note   Patient: Lewis MoccasinJoyce Homer  Date of  Birth: 1945-02-09  MRN: [id number]   Date of Procedure: 05/26/2021    Pre-Op Diagnosis: Left hip periprosthetic fracture with loose stem   Post-Op Diagnosis: Same as above   Procedure(s):  LEFT HIP REVISION POSTERIOR APPROACH WITH ALLOGRAFT   Surgeon(s):  Helene KelpGrenier, Eric, MD   Surgical Assistant: Surg Asst-1: Jalene MulletFellone, Jacob; Larene PickettLacy, Terry Robert   Anesthesia: General    Estimated Blood Loss (mL): 200   Complications: None    05/31/21 orthopedics  Status post removal hardware/ORIF revision left hip hemiarthroplasty.  Postop day #5.  Continue with therapy as tolerated.  Continue with aspirin for DVT prophylaxis.  Will increase tramadol to 50 to 100 mg every 6 hours as needed pain.  Awaiting rehab placement.  Okay to discharge from orthopedics when cleared by medicine and placement has been made.  Patient will follow-up with Dr. Gerda DissGrenier as outpatient on Jun 10, 2021    06/06/21 no new complaints, tolerating medications well  Postop course progressing well  Rehab vs SNF placement anticipated    MICROBIOLOGY    05/26/21  Urine  Negative  ASSESSMENT AND PLAN    1) Left femur acute fracture s/p operative management as above     Cleared per orthopedics   Physical therapy   Awaiting rehab placement   Outpatient follow up with orthopedics 06/10/21    2) Cardiovascular issues including hypertension and dyslipidemia     Echocardiogram as above   Medical management    Aspirin    Atorvastatin    Metoprolol    3) COPD not in acute exacerbation    4) Dispo IRF vs SNF anticipated      Electronically signed by Shaniya Tashiro Dena Billet, MD on 05/31/21 at 10:26 AM EDT

## 2021-06-06 NOTE — Progress Notes (Signed)
Problem: Physical Therapy - Adult  Goal: By Discharge: Performs mobility at highest level of function for planned discharge setting.  See evaluation for individualized goals.  Description: Problem: Mobility Impaired (Adult and Pediatric)  Goal: *Acute Goals and Plan of Care (Insert Text)  Description: FUNCTIONAL STATUS PRIOR TO ADMISSION: Patient was independent and active without use of DME.    HOME SUPPORT PRIOR TO ADMISSION: The patient lived alone with no local support.    Physical Therapy Goals  Initiated 05/27/2021  Patient/family stated goal: to get stronger and walk   1. Patient will move from supine to sit and sit to supine in bed with supervision/set-up within 7 day(s).   2. Patient will transfer from bed to chair and chair to bed with supervision/set-up using the least restrictive device within 7 day(s).  3. Patient will perform sit to stand with supervision/set-up within 7 day(s).  4. Patient will ambulate with supervision/set-up for 25 feet with the least restrictive device within 7 day(s).   5. Patient will ascend/descend 4 stairs with B handrail(s) with supervision/set-up within 7 day(s).  6. Patient will participate in lower extremity therapeutic exercise/activities with supervision/set-up for 10 minutes within 7 day(s).     Outcome: Progressing   PHYSICAL THERAPY REEVALUATION  Patient: [name] (76 y.o. female)  Date: 06/06/2021  Primary Diagnosis: Femur fracture (HCC) [S72.90XA]       Precautions:  Hip Precautions: Posterior hip precautions  In place during session: Peripheral IV and External Catheter       ASSESSMENT  Patient initially seen for PT evaluation 05/27/21 and 8 skilled PT sessions since evalution. Patient seen today for PT reevaluation s/t LOS. Patient A&O x4. Pt semi-supine in bed upon arrival, agreeable to session.      Based on the objective data described, the patient currently presents with impaired functional mobility, decreased independence in ADLs, decreased ROM, impaired  strength, poor safety awareness, impaired balance, impaired posture, and increased pain levels. (See below for objective details and assist levels).    Overall pt tolerated session fair today, currently with c/o 7/10 left foot pain and discomfort in left quad with mobility. Pt required min to mod A for bed mobility and transfers, increased time and verbal cues to maintain posterior hip precautions as pt was unable to recall posterior hip precautions at start of session. Pt demonstrates heavy posterior lean in standing requiring constant guarding and cues for safety. Demonstrates short, shuffling, step to gt pattern with decreased stance time on left LE. Verbal and tactile cues required for safe transfers.    Pt will benefit from continued skilled PT to address above deficits and return to PLOF, PT goals and POC reviewed on this date and continue to remain appropriate at this time. Current PT DC recommendation Skilled Nursing Facility once medically appropriate.     Current Level of Function Impacting Discharge (mobility/balance): min to mod A    Other factors to consider for discharge: lives alone, available support system works or is unable to provide adequate supervision and the patient would be alone, high risk for falls, not safe to be alone, concern for safely navigating or managing the home environment, and decreased insight into deficits     Patient will benefit from skilled therapy intervention to address the above noted impairments.       PLAN :  Recommendations and Planned Interventions: bed mobility training, transfer training, gait training, therapeutic exercises, patient and family training/education, and therapeutic activities    Recommend for staff: Out  of bed to chair for meals, Encourage HEP in prep for ADLs/mobility, LE elevation for management of edema, and ice to left foot and hip for pain control    Frequency/Duration: Patient will be followed by physical therapy: 3-5x/week to address  goals.    Recommendation for discharge: (in order for the patient to meet his/her long term goals)  Skilled Nursing Facility    IF patient discharges home will need the following DME: continuing to assess with progress     SUBJECTIVE:   Patient stated "I just want to go home."    OBJECTIVE DATA SUMMARY:     Past Medical History:   Diagnosis Date    Chronic obstructive pulmonary disease (HCC)     Hypercholesteremia     Insomnia     Sleep disorder    No past surgical history on file.    Critical Behavior:  Orientation  Orientation Level: Oriented X4  Cognition  Following Commands: Follows multistep commands with increased time  Attention Span: Attends with cues to redirect;Difficulty dividing attention  Insights: Decreased awareness of deficits  Initiation: Requires cues for some  Sequencing: Requires cues for some    Functional Mobility:  Bed Mobility:     Bed Mobility Training  Rolling: Minimum assistance;Assist X2;Moderate assistance  Supine to Sit: Minimum assistance;Assist X2;Moderate assistance  Scooting: Minimum assistance;Assist X2;Moderate assistance  Transfers:     Transfer Training  Sit to Stand: Minimum assistance;Assist X1  Stand to Sit: Minimum assistance;Assist X1  Bed to Chair: Minimum assistance;Assist X2  Balance:               Balance  Sitting: Impaired  Sitting - Static: Good (unsupported)  Sitting - Dynamic: Fair (occasional)  Standing: Impaired  Standing - Static: Fair;Constant support  Standing - Dynamic: Constant support;Fair  Radio broadcast assistantWheelchair Mobility:     Wheelchair Management  Wheelchair Management: No  Ambulation/Gait Training:                       Gait  Overall Level of Assistance: Contact-guard assistance;Minimum assistance;Assist X2  Interventions: Manual cues;Demonstration  Base of Support: Widened  Speed/Cadence: Shuffled;Slow  Step Length: Right shortened;Left shortened  Gait Abnormalities: Shuffling gait;Step to gait  Distance (ft): 3 Feet  Assistive Device: Walker, rolling;Gait belt                    Therapeutic Exercises:   Pt educated on LE HEP to include ankle pumps, LAQ, and seated marching 10x each; assistance required for marching on left LE, instructed to complete throughout the day to improve ROM and strength with good understanding verbalized and demonstrated.      Functional Measure:  [facility] [employer] AM-PACT "6 Clicks"         Basic Mobility Inpatient Short Form  How much difficulty does the patient currently have... Unable A Lot A Little None   1.  Turning over in bed (including adjusting bedclothes, sheets and blankets)?   []  1   []  2   [x]  3   []  4   2.  Sitting down on and standing up from a chair with arms ( e.g., wheelchair, bedside commode, etc.)   []  1   []  2   [x]  3   []  4   3.  Moving from lying on back to sitting on the side of the bed?   []  1   []  2   [x]  3   []  4  How much help from another person does the patient currently need... Total A Lot A Little None   4.  Moving to and from a bed to a chair (including a wheelchair)?   []  1   [x]  2   []  3   []  4   5.  Need to walk in hospital room?   []  1   [x]  2   []  3   []  4   6.  Climbing 3-5 steps with a railing?   []  1   [x]  2   []  3   []  4    2007, Trustees of , under license to [employer] [place], [place]. All rights reserved     Score:  Initial: 9/24 Most Recent: X (Date: 06/06/2021 )   Interpretation of Tool:  Represents activities that are increasingly more difficult (i.e. Bed mobility, Transfers, Gait).  Score 24 23 22-20 19-15 14-10 9-7 6   Modifier CH CI CJ CK CL CM CN     Pain Rating:  7/10, left foot   Pain Intervention(s):   nursing notified and addressing, ice, elevation, and repositioning    Activity Tolerance:   Fair  and requires frequent rest breaks    After treatment patient left in no apparent distress:   Bed locked and in lowest position Patient left in no apparent distress sitting up in chair and Call bell within reach and nsg updated.    COMMUNICATION/EDUCATION:   The patient's plan of care was  discussed with: Occupational therapy assistant, Registered nurse, and Case management    Patient Education  Education Given To: Patient  Education Provided: Role of Therapy;Plan of Care;Home Exercise Program;Precautions;Transfer Training  Education Method: Demonstration;Verbal;Teach Back  Barriers to Learning: Lack of Family Support  Education Outcome: Verbalized understanding;Demonstrated understanding    PT/OT sessions occurred together for increased safety of pt and clinician.    Thank you for this referral.  , PT, DPT  Minutes: 30

## 2021-06-06 NOTE — Progress Notes (Signed)
Orthopedic progress note    Date:06/06/2021       Room:501/02  Patient Name:[name]     Date of Birth:07-24-1945     Age:76 y.o.      Subjective    : That is post removal hardware/ORIF revision left hemiarthroplasty.  Postop day #8. Patient doing extremely well.  Pain is well controlled. Continues to progress well with therapy. No other complaints at this time.    : Still awaiting placement for inpatient rehab.  No hip pain.  She does endorse some left dorsal foot pain.  Progressing slowly with therapy.  No complaints otherwise this morning.  Sitting up and eating lunch.    Objective           Vitals Last 24 Hours:  TEMPERATURE:  Temp  Avg: 97.8 F (36.6 C)  Min: 97.6 F (36.4 C)  Max: 98 F (36.7 C)  RESPIRATIONS RANGE: Resp  Avg: 17.3  Min: 16  Max: 18  PULSE OXIMETRY RANGE: SpO2  Avg: 96 %  Min: 95 %  Max: 97 %  PULSE RANGE: Pulse  Avg: 83.8  Min: 72  Max: 90  BLOOD PRESSURE RANGE: Systolic (24hrs), Avg:109 , Min:106 , Max:113   ; Diastolic (24hrs), Avg:64, Min:61, Max:65    Current Facility-Administered Medications   Medication Dose Route Frequency    melatonin tablet 5 mg  5 mg Oral Nightly PRN    acetaminophen (TYLENOL) tablet 1,000 mg  1,000 mg Oral Q6H    albuterol sulfate HFA (PROVENTIL;VENTOLIN;PROAIR) 108 (90 Base) MCG/ACT inhaler 2 puff  2 puff Inhalation Q4H PRN    aspirin chewable tablet 81 mg  81 mg Oral BID    atorvastatin (LIPITOR) tablet 40 mg  40 mg Oral Nightly    budesonide-formoterol (SYMBICORT) 160-4.5 MCG/ACT inhaler 2 puff  2 puff Inhalation BID    And    tiotropium (SPIRIVA RESPIMAT) 2.5 MCG/ACT inhaler 2 puff  2 puff Inhalation Daily    celecoxib (CELEBREX) capsule 100 mg  100 mg Oral BID    metoprolol succinate (TOPROL XL) extended release tablet 25 mg  25 mg Oral Daily    ondansetron (ZOFRAN-ODT) disintegrating tablet 4 mg  4 mg Oral Q6H PRN    Or    ondansetron (ZOFRAN) injection 4 mg  4 mg IntraVENous Q6H PRN    pantoprazole (PROTONIX) tablet 40 mg  40 mg Oral QAM AC     polyethylene glycol (GLYCOLAX) packet 17 g  17 g Oral Daily    sennosides-docusate sodium (SENOKOT-S) 8.6-50 MG tablet 1 tablet  1 tablet Oral BID    sodium chloride flush 0.9 % injection 5-40 mL  5-40 mL IntraVENous 2 times per day    sodium chloride flush 0.9 % injection 5-40 mL  5-40 mL IntraVENous PRN    0.9 % sodium chloride infusion   IntraVENous PRN    traMADol (ULTRAM) tablet 50 mg  50 mg Oral Q6H PRN          I/O (24Hr):    Intake/Output Summary (Last 24 hours) at 06/06/2021 1241  Last data filed at 06/06/2021 1154  Gross per 24 hour   Intake 480 ml   Output 351 ml   Net 129 ml     Objective:  Vital signs: (most recent): Blood pressure 106/65, pulse 72, temperature 97.9 F (36.6 C), temperature source Oral, resp. rate 18, height 5' 2.99" (1.6 m), weight 113 lb 15.7 oz (51.7 kg), SpO2 95 %.    Labs/Imaging/Diagnostics  Physical Exam:  Left hip: Aquacel Ag dressings clean dry and intact.  No tenderness palpation the left thigh.  No calf pain.  Ankle strength 4+ out of 5 without deficit.    Assessment//Plan           Patient Active Problem List    Diagnosis Date Noted    Femur fracture (HCC) 05/24/2021    Closed fracture of neck of left femur (HCC) 01/26/2021    Hip fracture (HCC) 01/25/2021    Sepsis (HCC) 01/02/2021    Cardiac syncope 11/27/2020    Ambulatory dysfunction 11/27/2020     Status post removal hardware/ORIF revision left hemiarthroplasty postop day #11  Continue with therapy.  Exercises encouraged.  Aspirin for DVT prophylaxis.  Awaiting rehab placement.  Cleared for discharge from orthopedics when cleared by medicine and placement has been made.  Follow-up with Dr. Gerda Diss 2 to 3 weeks after discharge.      Electronically signed by Helene Kelp, MD on 06/06/2021 at 12:41 PM

## 2021-06-06 NOTE — Plan of Care (Signed)
Problem: Discharge Planning  Goal: Discharge to home or other facility with appropriate resources  Outcome: Progressing     Problem: Pain  Goal: Verbalizes/displays adequate comfort level or baseline comfort level  Outcome: Progressing     Problem: Safety - Adult  Goal: Free from fall injury  Outcome: Progressing

## 2021-06-06 NOTE — Progress Notes (Signed)
SBAR/Report received from Venita Sheffield, Therapist, sports.

## 2021-06-06 NOTE — Plan of Care (Signed)
Problem: Pain  Goal: Verbalizes/displays adequate comfort level or baseline comfort level  06/06/2021 0805 by Desma Maxim, RN  Outcome: Progressing  06/06/2021 0529 by Venita Sheffield, RN  Outcome: Progressing     Problem: Safety - Adult  Goal: Free from fall injury  06/06/2021 0529 by Venita Sheffield, RN  Outcome: Progressing

## 2021-06-06 NOTE — Progress Notes (Addendum)
CM reviewed medical record. Patient really wanted to go to [facility], but received message from [facility] that patient had an outstanding bill with them and refused to pay in order to be accepted back. CM spoke to patient regarding payment due and patient stated, yes I owe them about $3000.00 and good luck with them getting it from me. CM explained to patient that she has been accepted at [facility] and [facility] and asked if she had a preference. Patient stated I don't care which one, I just want to leave here. CM called and spoke to liaison for both, Jade to see which one had beds. Lesly Rubenstein stated [place] would have a bed. CM called and started auth for SNF at [facility] through [place]. Reference number is #[id number].  CM also spoke to patient friend, Vena Austria to inform of above.

## 2021-06-07 MED ORDER — ACETAMINOPHEN 325 MG PO TABS
325 MG | Freq: Once | ORAL | Status: DC
Start: 2021-06-07 — End: 2021-06-07

## 2021-06-07 MED FILL — MELATONIN 5 MG PO TABS: 5 MG | ORAL | Qty: 1

## 2021-06-07 MED FILL — ACETAMINOPHEN EXTRA STRENGTH 500 MG PO TABS: 500 MG | ORAL | Qty: 2

## 2021-06-07 MED FILL — ATORVASTATIN CALCIUM 40 MG PO TABS: 40 MG | ORAL | Qty: 1

## 2021-06-07 MED FILL — CELECOXIB 100 MG PO CAPS: 100 MG | ORAL | Qty: 1

## 2021-06-07 MED FILL — SYMBICORT 160-4.5 MCG/ACT IN AERO: 160-4.5 MCG/ACT | RESPIRATORY_TRACT | Qty: 6

## 2021-06-07 MED FILL — PANTOPRAZOLE SODIUM 40 MG PO TBEC: 40 MG | ORAL | Qty: 1

## 2021-06-07 MED FILL — ASPIRIN LOW STRENGTH 81 MG PO CHEW: 81 MG | ORAL | Qty: 1

## 2021-06-07 MED FILL — STIMULANT LAXATIVE 8.6-50 MG PO TABS: ORAL | Qty: 1

## 2021-06-07 MED FILL — TRAMADOL HCL 50 MG PO TABS: 50 MG | ORAL | Qty: 1

## 2021-06-07 NOTE — Progress Notes (Signed)
Nutrition Assessment     Type and Reason for Visit: Reassess (Early Goal)    Nutrition Recommendations/Plan:   Continue diet.  Ensure Enlive x1/d.  Monitor and document PO and ONS intakes in I/Os.     Malnutrition Assessment:  Malnutrition Status: No malnutrition    Nutrition Assessment:  Admitted for femur fx. S/p ORIF revision. Discharge pending placement. Intakes >76%, no reported weight loss. (5/16) POD 12 ORIF revision left hemiarthroplasty. Varied intakes noted between 25-75% of meals d/t food preferences per Pt. Food preferences obtained. +h/o ONS x1/d. Food allergy verified- tolerates milk, butter, ice cream, yogurt. RD to add ONS, continue diet. No new labs for review. Meds: PPI, senokot, miralax, lopressor, celebrex, lipitor, tylenol.    Estimated Daily Nutrient Needs:  Energy (kcal):  1293-1551 kcal/d (25-30 kcal/kg) Weight Used for Energy Requirements: Current     Protein (g):  62 gm/d Weight Used for Protein Requirements: Current        Fluid (ml/day):  2409-7353 mL/d Method Used for Fluid Requirements: 1 ml/kcal    Nutrition Related Findings:   No acute findings per NFPE. No N/V/D/C nor chewing/swallowing difficulties reported. Last BM 5/15. Nonpitting LLE edema? Wound Type: Surgical Incision (L. Hip)    Current Nutrition Therapies:    ADULT DIET; Regular    Anthropometric Measures:  Height: 160 cm (5' 2.99")  Current Body Wt: 113 lb 15.7 oz (51.7 kg) (5/11)   BMI: 20.2    Nutrition Diagnosis:   Inadequate protein-energy intake related to  (food preferences) as evidenced by intake 26-50%    Nutrition Interventions:   Food and/or Nutrient Delivery: Continue Current Diet, Start Oral Nutrition Supplement  Nutrition Education/Counseling: No recommendation at this time  Coordination of Nutrition Care: Continue to monitor while inpatient  Plan of Care discussed with: Pt    Goals:  Previous Goal Met: Progressing toward Goal(s)  Goals: Meet at least 75% of estimated needs, PO intake 75% or greater, within 7  days    Nutrition Monitoring and Evaluation:   Behavioral-Environmental Outcomes: None Identified  Food/Nutrient Intake Outcomes: Food and Nutrient Intake, Supplement Intake  Physical Signs/Symptoms Outcomes: Biochemical Data, Meal Time Behavior, Weight, Fluid Status or Edema    Discharge Planning:    Continue current diet, Continue Oral Nutrition Supplement     Terrall Laity, RD  Contact: ext. [phone].

## 2021-06-07 NOTE — Progress Notes (Signed)
Orthopedic progress note    Date:06/07/2021       Room:501/02  Patient Name:[name]     Date of Birth:08/18/1945     Age:76 y.o.      Subjective    Status post removal hardware/ORIF revision left hemiarthroplasty.  Postop day #12.  Patient continues to progress well.  Still slow progress of therapy.  Has pain with standing.  Pain is well controlled otherwise.  Objective           Vitals Last 24 Hours:  TEMPERATURE:  Temp  Avg: 98.2 F (36.8 C)  Min: 97.7 F (36.5 C)  Max: 98.7 F (37.1 C)  RESPIRATIONS RANGE: Resp  Avg: 17.3  Min: 16  Max: 18  PULSE OXIMETRY RANGE: SpO2  Avg: 96 %  Min: 94 %  Max: 98 %  PULSE RANGE: Pulse  Avg: 79.7  Min: 72  Max: 86  BLOOD PRESSURE RANGE: Systolic (24hrs), Avg:95 , Min:86 , Max:106   ; Diastolic (24hrs), Avg:59, Min:50, Max:73    Current Facility-Administered Medications   Medication Dose Route Frequency    melatonin tablet 5 mg  5 mg Oral Nightly PRN    acetaminophen (TYLENOL) tablet 1,000 mg  1,000 mg Oral Q6H    albuterol sulfate HFA (PROVENTIL;VENTOLIN;PROAIR) 108 (90 Base) MCG/ACT inhaler 2 puff  2 puff Inhalation Q4H PRN    aspirin chewable tablet 81 mg  81 mg Oral BID    atorvastatin (LIPITOR) tablet 40 mg  40 mg Oral Nightly    budesonide-formoterol (SYMBICORT) 160-4.5 MCG/ACT inhaler 2 puff  2 puff Inhalation BID    And    tiotropium (SPIRIVA RESPIMAT) 2.5 MCG/ACT inhaler 2 puff  2 puff Inhalation Daily    celecoxib (CELEBREX) capsule 100 mg  100 mg Oral BID    metoprolol succinate (TOPROL XL) extended release tablet 25 mg  25 mg Oral Daily    ondansetron (ZOFRAN-ODT) disintegrating tablet 4 mg  4 mg Oral Q6H PRN    Or    ondansetron (ZOFRAN) injection 4 mg  4 mg IntraVENous Q6H PRN    pantoprazole (PROTONIX) tablet 40 mg  40 mg Oral QAM AC    polyethylene glycol (GLYCOLAX) packet 17 g  17 g Oral Daily    sennosides-docusate sodium (SENOKOT-S) 8.6-50 MG tablet 1 tablet  1 tablet Oral BID    sodium chloride flush 0.9 % injection 5-40 mL  5-40 mL IntraVENous 2 times  per day    sodium chloride flush 0.9 % injection 5-40 mL  5-40 mL IntraVENous PRN    0.9 % sodium chloride infusion   IntraVENous PRN    traMADol (ULTRAM) tablet 50 mg  50 mg Oral Q6H PRN          I/O (24Hr):    Intake/Output Summary (Last 24 hours) at 06/07/2021 1642  Last data filed at 06/07/2021 0606  Gross per 24 hour   Intake 110 ml   Output 250 ml   Net -140 ml     Objective:  Vital signs: (most recent): Blood pressure (!) 87/50, pulse 82, temperature 98.1 F (36.7 C), temperature source Oral, resp. rate 16, height 1.6 m (5' 2.99"), weight 51.7 kg (113 lb 15.7 oz), SpO2 94 %.    Labs/Imaging/Diagnostics    Labs:  CBC:No results for input(s): WBC, RBC, HGB, HCT, MCV, RDW, PLT in the last 72 hours.  CHEMISTRIES:No results for input(s): NA, K, CL, CO2, BUN, CREA, GLUCOSE, CA, PHOS, MG in the last 72 hours.PT/INR:No results for  input(s): PROTIME, INR in the last 72 hours.  APTT:No results for input(s): APTT in the last 72 hours.  LIVER PROFILE:No results for input(s): AST, ALT, BILIDIR, BILITOT, ALKPHOS in the last 72 hours.  Lab Results   Component Value Date/Time    ALT 16 01/28/2021 08:50 AM    AST 22 01/28/2021 08:50 AM       Physical Exam:  Left hip: Mepilex Ag postop dressing remains in place clean dry and intact.  No calf pain to palpation.  Assessment//Plan           Patient Active Problem List    Diagnosis Date Noted    Femur fracture (HCC) 05/24/2021    Closed fracture of neck of left femur (HCC) 01/26/2021    Hip fracture (HCC) 01/25/2021    Sepsis (HCC) 01/02/2021    Cardiac syncope 11/27/2020    Ambulatory dysfunction 11/27/2020     Status post removal/ORIF revision left hip hemiarthroplasty.  Postop day #12.  Continue with therapy as tolerated.  Continue with aspirin for DVT prophylaxis.  Awaiting rehab placement to [facility].  Okay to discharge from orthopedics when cleared by medicine and placement has been made.    Electronically signed by Tommi Rumps, PA-C on 06/07/2021 at  4:42 PM

## 2021-06-07 NOTE — Plan of Care (Signed)
OCCUPATIONAL THERAPY TREATMENT  Patient: [name] (76 y.o. female)  Date: 06/07/2021  Primary Diagnosis: Femur fracture ([place]) [S72.90XA]       Precautions:               Hip Precautions: Posterior hip precautions  In place during session: External Catheter  Chart, occupational therapy assessment, plan of care, and goals were reviewed.    ASSESSMENT  Patient continues with skilled OT services and is progressing towards goals. Pt semi supine in bed upon OT arrival, agreeable to session. Pt A&O x 4. (See below for objective details and assist levels).     Overall pt tolerated session fair today with completion of bed mobility, transfers, ADLs, and ambulation.  Pt completed bed mobility with less assist this date requiring cueing for sequencing and assist with holding up LLE.  Pt completed transfers with MinA and additional time overall and noted with posterior lean once in standing.  Pt required cueing for moving feet back and regaining balance.  Pt able to ambulate to toilet and complete transfer on and off with MinA.  Standing bladder hygiene completed with CGA/setup A.  Pt completed ambulation into hallway with MinA overall using RW in preparation for household mobility and chair follow provided, pt able to increase distance this date.  Pt completed seated grooming in recliner with setup A.  Pt educated on calling for help throughout day to ambulate to bathroom when needed due to pt being hesitant to call for help.  Pt educated on HEP and verbalized understanding.  Pt left seated in recliner with call bell within reach and needs met. Will continue to benefit from skilled OT services, and will continue to progress as tolerated.     Other factors to consider for discharge: lives alone, poor safety awareness, high risk for falls, not safe to be alone, and concern for safely navigating or managing the home environment        PLAN :  Patient continues to benefit from skilled intervention to address the above  impairments.  Continue treatment per established plan of care to address goals.    Recommend with staff: Out of bed to chair for meals, Encourage HEP in prep for ADLs/mobility, and Amb to bathroom for toileting with gt belt and AD    Recommend next OT session: Toileting and standing grooming    Recommendation for discharge: (in order for the patient to meet his/her long term goals): Inpatient Rehabilitation Facility     IF patient discharges home will need the following DME: TBD       SUBJECTIVE:   Patient stated "I am going to fall."    OBJECTIVE DATA SUMMARY:   Cognitive/Behavioral Status:  Orientation  Overall Orientation Status: Within Normal Limits  Orientation Level: Oriented X4  Cognition  Overall Cognitive Status: WNL  Arousal/Alertness: Appropriate responses to stimuli  Following Commands: Follows multistep commands with increased time  Attention Span: Difficulty dividing attention  Memory: Decreased recall of precautions  Safety Judgement: Good awareness of safety precautions  Problem Solving: Assistance required to identify errors made  Insights: Decreased awareness of deficits  Initiation: Requires cues for some  Sequencing: Requires cues for some    Functional Mobility and Transfers for ADLs:  Bed Mobility:  Bed Mobility Training  Interventions: Verbal cues;Tactile cues;Safety awareness training  Rolling: Minimum assistance  Supine to Sit: Minimum assistance;Additional time  Sit to Supine: Minimum assistance;Additional time  Scooting: Minimum assistance;Additional time    Transfers:  Biomedical engineer  Training: Yes  Interventions: Verbal cues;Tactile cues;Safety awareness training  Sit to Stand: Minimum assistance;Additional time  Stand to Sit: Minimum assistance;Additional time  Toilet Transfer: Minimum assistance;Additional time    Balance:  Balance  Sitting: Impaired  Sitting - Static: Good (unsupported)  Sitting - Dynamic: Fair (occasional)  Standing: Impaired  Standing - Static:  Fair;Constant support  Standing - Dynamic: Fair;Constant support    ADL Intervention:  Grooming: Setup   Seated face washing    Toileting: Setup;Contact guard assistance  Standing bladder hygiene    Pain Rating:  6/10 L hip and foot pain     Pain Intervention(s):   nursing notified and addressing, ice, rest, and elevation    Activity Tolerance:   Fair  and requires rest breaks    After treatment patient left in no apparent distress:   Bed locked and returned to lowest position, Patient left in no apparent distress sitting up in chair and Call bell within reach, and nsg updated     COMMUNICATION/EDUCATION:   The patient's plan of care was discussed with: Physical therapy assistant and Registered nurse    Patient Education  Education Given To: Patient  Education Provided: Role of Therapy;Transfer Training;Energy Conservation;Precautions  Education Method: Demonstration;Verbal  Education Outcome: Verbalized understanding;Continued education needed    PT/OT sessions occurred together for increased safety of pt and clinician.    Thank you for this referral.  Chesni Vos, OTA  Minutes: 49    Problem: Occupational Therapy - Adult  Goal: By Discharge: Performs self-care activities at highest level of function for planned discharge setting.  See evaluation for individualized goals.  Description: Description: FUNCTIONAL STATUS PRIOR TO ADMISSION: Pt states she was ind with ADLs with no AD PTA.     HOME SUPPORT: Lives alone    Occupational Therapy Goals  Initiated 05/27/2021    Pt stated goal: To return to PLOF  Pt will be Mod I sup <> sit in prep for EOB ADLs  Pt will be Mod I grooming standing sink side LRAD  Pt will be Mod I UB dressing sitting EOB/long sit   Pt will be Mod I LE dressing sitting EOB/long sit  Pt will be Mod I sit <> stand in prep for toileting LRAD  Pt will be Mod I toileting/toilet transfer/cloth mgmt LRAD    Outcome: Progressing

## 2021-06-07 NOTE — Progress Notes (Signed)
[facility] Hospitalist Progress Note       Hospitalization course:  12/01/20 echocardiogram    Left Ventricle: Low normal left ventricular systolic function with a visually estimated EF of 50 - 55%. Left ventricle is smaller than normal. Mild septal thickening. Mild posterior thickening. See diagram for wall motion findings. Grade I diastolic dysfunction with normal LAP.    Right Ventricle: Reduced systolic function.    Aortic Valve: Tricuspid valve. Mild sclerosis of the aortic valve cusp. Mild regurgitation.    Mitral Valve: Valve structure is normal. Mild annular calcification at the posterior leaflet of the mitral valve. Mild to moderate paravalvular regurgitation.    Right Atrium: Right atrium is mildly dilated.    Technical qualifiers: Echo study was technically difficult due to patient's body habitus.    05/24/21 admission course  [name] is a 76 y.o. female with PMH of COPD, HLP and recent fall with left femur fracture s/p ORIF of the left hip. She presented to the ED with chief complaint of fall and left hip pain. She reports mechanical fall when walking at home today,  was not using walker. She denies LOC, lightheadedness or vertigo prior to fall. No focal neurology symptoms. She report left hip pain, severe in nature, worsened with movement.     In the ED, noted hypertensive. X-ray showed acute fracture of the left femoral greater and lesser trochanters.    Brief Postoperative Note   Patient: [name]  Date of Birth: September 18, 1945  MRN: [id number]   Date of Procedure: 05/26/2021    Pre-Op Diagnosis: Left hip periprosthetic fracture with loose stem   Post-Op Diagnosis: Same as above   Procedure(s):  LEFT HIP REVISION POSTERIOR APPROACH WITH ALLOGRAFT   Surgeon(s):  Helene Kelp, MD   Surgical Assistant: Surg Asst-1: Jalene Mullet; Larene Pickett   Anesthesia: General    Estimated Blood Loss (mL): 200   Complications: None    05/31/21 orthopedics  Status post removal hardware/ORIF revision left hip  hemiarthroplasty.  Postop day #5.  Continue with therapy as tolerated.  Continue with aspirin for DVT prophylaxis.  Will increase tramadol to 50 to 100 mg every 6 hours as needed pain.  Awaiting rehab placement.  Okay to discharge from orthopedics when cleared by medicine and placement has been made.  Patient will follow-up with Dr. Gerda Diss as outpatient on Jun 10, 2021    06/06/21 no new complaints, tolerating medications well  Postop course progressing well  Rehab vs SNF placement anticipated    Subjective        06/07/21-patient seen for the first time, chart was reviewed, patient sitting in bed, BP soft but asymptomatic.  Waiting for placement at this time    Objective         Vitals Last 24 Hours:  TEMPERATURE:  Temp  Avg: 98.3 F (36.8 C)  Min: 97.7 F (36.5 C)  Max: 98.7 F (37.1 C)  RESPIRATIONS RANGE: Resp  Avg: 17.7  Min: 16  Max: 18  PULSE OXIMETRY RANGE: SpO2  Avg: 96.4 %  Min: 94 %  Max: 98 %  PULSE RANGE: Pulse  Avg: 78.8  Min: 72  Max: 86  BLOOD PRESSURE RANGE: Systolic (24hrs), Avg:94 , Min:86 , Max:103   ; Diastolic (24hrs), Avg:60, Min:54, Max:73    I/O (24Hr):    Intake/Output Summary (Last 24 hours) at 06/07/2021 1100  Last data filed at 06/07/2021 0606  Gross per 24 hour   Intake 670 ml   Output 550  ml   Net 120 ml       Vitals:    06/07/21 0824 06/07/21 0924 06/07/21 0925 06/07/21 1030   BP:  (!) 87/54 (!) 86/55 (!) 91/58   Pulse: 77 85 86 84   Resp: 18  18    Temp:   97.7 F (36.5 C)    TempSrc:   Axillary    SpO2: 96%  97%    Weight:       Height:          Objective:  General Appearance:  Comfortable.    Vital signs: (most recent): Blood pressure (!) 91/58, pulse 84, temperature 97.7 F (36.5 C), temperature source Axillary, resp. rate 18, height 1.6 m (5' 2.99"), weight 51.7 kg (113 lb 15.7 oz), SpO2 97 %.    General: Thin built and nourished  HEENT: moist oral mucosa   CV: RRR,   Respiratory: Symmetric expansion  Abdomen: nondistended, nontender  Extremities: no peripheral edema    Musculoskeletal:  L DROM, dressing in place    Neuro: alert, normal speech     ASSESSMENT AND PLAN    1) Left femur acute fracture s/p operative management as above     Cleared per orthopedics   Physical therapy   Awaiting rehab placement   Outpatient follow up with orthopedics 06/10/21    2) Cardiovascular issues including hypertension and dyslipidemia     Echocardiogram as above   Medical management    Aspirin    Atorvastatin    Metoprolol    3) COPD not in acute exacerbation    4) Dispo IRF vs SNF anticipated      Signed Valetta Close, MD

## 2021-06-07 NOTE — Plan of Care (Signed)
PHYSICAL THERAPY TREATMENT     Patient: [name] (76 y.o. female)  Date: 06/07/2021  Diagnosis: Femur fracture (HCC) [S72.90XA] <principal problem not specified>      Precautions:                Hip Precautions: Posterior hip precautions  In place during session: External Catheter  Chart, physical therapy assessment, plan of care and goals were reviewed.    ASSESSMENT  Patient continues with skilled PT services and is progressing towards goals. Pt supine in bed upon PT arrival, agreeable to session. Pt A&O x 4.  Pt's overall mobility is CGA/ Min A. Pt ambulated with RW approximately 20 ft to the bathroom and COTA and PTA assisted pt with peri care. Pt then ambulated approximately 35 ft into the hallway until pt sat down in recliner. Posterior lean is noted when pt first stands, pt self corrects. Gait was slow with decreased bilateral step length, no LOB noted. Pt requires frequent rest breaks, verbal cues in PLB, and encouragement to walk further distances  (See below for objective details and assist levels). Overall pt tolerated session fair today with increased mobility. Will continue to benefit from skilled PT services, and will continue to progress as tolerated.     Current Level of Function Impacting Discharge (mobility/balance): CGA/MinA    Other factors to consider for discharge: lives alone, poor safety awareness, and high risk for falls       PLAN :  Patient continues to benefit from skilled intervention to address decreased activity tolerance, poor safety awareness, and increased pain levels. Continue treatment per established plan of care to address goals.    Recommend with staff: Out of bed to chair for meals, Encourage HEP in prep for ADLs/mobility, Amb to bathroom for toileting with gt belt and AD, and LE elevation for management of edema    Recommendation for discharge: (in order for the patient to meet his/her long term goals)  Skilled Nursing Facility    IF patient discharges home will  need the following DME: TBD       SUBJECTIVE:   Patient stated "I feel stiff."    OBJECTIVE DATA SUMMARY:   Critical Behavior:  Orientation  Overall Orientation Status: Within Normal Limits  Orientation Level: Oriented X4  Cognition  Overall Cognitive Status: WNL  Arousal/Alertness: Appropriate responses to stimuli  Following Commands: Follows multistep commands with increased time  Attention Span: Difficulty dividing attention  Memory: Decreased recall of precautions  Safety Judgement: Good awareness of safety precautions  Problem Solving: Assistance required to identify errors made  Insights: Decreased awareness of deficits  Initiation: Requires cues for some  Sequencing: Requires cues for some    Functional Mobility Training:  Bed Mobility:  Bed Mobility Training  Interventions: Verbal cues;Tactile cues;Safety awareness training  Rolling: Minimum assistance  Supine to Sit: Minimum assistance;Additional time  Sit to Supine: Minimum assistance;Additional time  Scooting: Minimum assistance;Additional time  Transfers:  Art therapist: Yes  Interventions: Verbal cues;Tactile cues;Safety awareness training  Sit to Stand: Minimum assistance;Additional time  Stand to Sit: Minimum assistance;Additional time  Toilet Transfer: Minimum assistance;Additional time  Balance:  Balance  Sitting: Impaired  Sitting - Static: Good (unsupported)  Sitting - Dynamic: Fair (occasional)  Standing: Impaired  Standing - Static: Fair;Constant support  Standing - Dynamic: Fair;Constant support  Wheelchair Mobility:  Wheelchair Management  Wheelchair Management: No  Ambulation/Gait Training:     Gait  Overall Level of Assistance: Contact-guard assistance;Minimum assistance  Interventions: Verbal cues;Tactile cues;Safety awareness training  Base of Support: Widened  Speed/Cadence: Slow  Step Length: Right shortened;Left shortened  Gait Abnormalities: Step to gait  Distance (ft): 55 Feet  Assistive Device: Gait  belt;Walker, rolling      Therapeutic Exercises:   Pt educated on AP and Heels slides in the bed     Pain Rating:  6/10 LLE    Pain Intervention(s):   nursing notified, ice, and elevation    Activity Tolerance:   Fair     After treatment patient left in no apparent distress:   Bed locked and returned to lowest position, Patient left in no apparent distress sitting up in chair and Call bell within reach, and nsg updated       COMMUNICATION/COLLABORATION:   The patient's plan of care was discussed with: Registered nurse    Patient Education  Education Given To: Patient  Education Provided: Plan of Care;Home Exercise Program;Precautions;Equipment;Fall Prevention Strategies;Energy Conservation  Education Provided Comments: Educated pt on PLB  Education Method: Demonstration;Verbal;Teach Back  Barriers to Learning: Lack of Family Support  Education Outcome: Verbalized understanding;Demonstrated understanding      Barnet Pall, PTA  Written By: Molinda Bailiff, PTA  Minutes: 50  PT/OT sessions occurred together for increased safety of pt and clinician.          Problem: Physical Therapy - Adult  Goal: By Discharge: Performs mobility at highest level of function for planned discharge setting.  See evaluation for individualized goals.  Description: Problem: Mobility Impaired (Adult and Pediatric)  Goal: *Acute Goals and Plan of Care (Insert Text)  Description: FUNCTIONAL STATUS PRIOR TO ADMISSION: Patient was independent and active without use of DME.    HOME SUPPORT PRIOR TO ADMISSION: The patient lived alone with no local support.    Physical Therapy Goals  Initiated 05/27/2021  Patient/family stated goal: to get stronger and walk   1. Patient will move from supine to sit and sit to supine in bed with supervision/set-up within 7 day(s).   2. Patient will transfer from bed to chair and chair to bed with supervision/set-up using the least restrictive device within 7 day(s).  3. Patient will perform sit to stand with  supervision/set-up within 7 day(s).  4. Patient will ambulate with supervision/set-up for 25 feet with the least restrictive device within 7 day(s).   5. Patient will ascend/descend 4 stairs with B handrail(s) with supervision/set-up within 7 day(s).  6. Patient will participate in lower extremity therapeutic exercise/activities with supervision/set-up for 10 minutes within 7 day(s).     Outcome: Progressing

## 2021-06-08 MED ORDER — METOPROLOL SUCCINATE ER 25 MG PO TB24
25 MG | Freq: Every day | ORAL | Status: DC
Start: 2021-06-08 — End: 2021-06-08

## 2021-06-08 MED ORDER — CELECOXIB 100 MG PO CAPS
100 MG | ORAL_CAPSULE | Freq: Two times a day (BID) | ORAL | 3 refills | Status: AC
Start: 2021-06-08 — End: 2021-10-21

## 2021-06-08 MED ORDER — METOPROLOL SUCCINATE ER 25 MG PO TB24
25 MG | Freq: Every day | ORAL | Status: AC
Start: 2021-06-08 — End: 2021-06-09
  Administered 2021-06-09: 14:00:00 12.5 mg via ORAL

## 2021-06-08 MED ORDER — ASPIRIN 81 MG PO CHEW
81 MG | ORAL_TABLET | Freq: Two times a day (BID) | ORAL | 3 refills | Status: DC
Start: 2021-06-08 — End: 2021-06-09

## 2021-06-08 MED ORDER — METOPROLOL SUCCINATE ER 25 MG PO TB24
25 MG | ORAL_TABLET | Freq: Every day | ORAL | 3 refills | Status: AC
Start: 2021-06-08 — End: 2021-10-21

## 2021-06-08 MED FILL — CELECOXIB 100 MG PO CAPS: 100 MG | ORAL | Qty: 1

## 2021-06-08 MED FILL — SYMBICORT 160-4.5 MCG/ACT IN AERO: 160-4.5 MCG/ACT | RESPIRATORY_TRACT | Qty: 6

## 2021-06-08 MED FILL — ASPIRIN LOW STRENGTH 81 MG PO CHEW: 81 MG | ORAL | Qty: 1

## 2021-06-08 MED FILL — STIMULANT LAXATIVE 8.6-50 MG PO TABS: ORAL | Qty: 1

## 2021-06-08 MED FILL — TRAMADOL HCL 50 MG PO TABS: 50 MG | ORAL | Qty: 1

## 2021-06-08 MED FILL — PANTOPRAZOLE SODIUM 40 MG PO TBEC: 40 MG | ORAL | Qty: 1

## 2021-06-08 MED FILL — ATORVASTATIN CALCIUM 40 MG PO TABS: 40 MG | ORAL | Qty: 1

## 2021-06-08 MED FILL — MELATONIN 5 MG PO TABS: 5 MG | ORAL | Qty: 1

## 2021-06-08 MED FILL — ACETAMINOPHEN EXTRA STRENGTH 500 MG PO TABS: 500 MG | ORAL | Qty: 2

## 2021-06-08 MED FILL — SPIRIVA RESPIMAT 2.5 MCG/ACT IN AERS: 2.5 MCG/ACT | RESPIRATORY_TRACT | Qty: 4

## 2021-06-08 MED FILL — PEG 3350 17 G PO PACK: 17 g | ORAL | Qty: 1

## 2021-06-08 NOTE — Plan of Care (Signed)
OCCUPATIONAL THERAPY TREATMENT: WEEKLY REASSESSMENT    Patient: [name] (76 y.o. female)  Date: 06/08/2021  Primary Diagnosis: Femur fracture (HCC) [S72.90XA]       Precautions: Hip Precautions: Posterior hip precautions  In place during session: External Catheter    Chart, occupational therapy assessment, plan of care, and goals were reviewed.    ASSESSMENT  Patient initially seen for OT evaluation 05/27/21 and has been seen for 10 skilled OT sessions since evalution. Patient seen today for OT reevaluation s/t LOS. Patient A&O x4. Pt semi supine upon arrival, agreeable to session.      Based on the objective data described, the patient currently presents with impaired functional mobility, decreased independence in ADLs, impaired ability to perform high-level IADLs, impaired strength, poor body mechanics, decreased activity tolerance, poor safety awareness, impaired cognition, decreased command following, poor attention/concentration, impaired balance, impaired posture, and increased pain levels. (See below for objective details and assist levels). Overall pt tolerated session fair today, with pt completing bed mobility, sit<>stand transfers, and ambulating in hallway using RW with CGA-min A overall. Additional time req'd for all mobility this date. Pt continues to req VC to sequence functional mobility. Pt continues to req VC to recall safety strategies (pushing from/reaching back for bed) and posterior hip precautions.  Pt reeducated on hip precautions with pt verbalizing understanding. Pt req's mod A for LB dressing as pt req'd A to adjust L sock d/t posterior hip precautions and no AD available. Pt educated on long handled AD to increase IND with LB ADLs while maintaining precautions. Pt verbalized understanding. Total A req'd for posterior peri care while standing with RW with CGA for balance. OT provided chair follow during hallway ambulation for safety. Pt req'd to sit during ambulation d/t having BM.      Pt continues to benefit from skilled OT to address the above impairments.return to PLOF, OT goals and POC reviewed on this date and continue to remain appropriate at this time. Current OT DC recommendation [place] once medically appropriate.     Other factors to consider for discharge: lives alone, poor safety awareness, high risk for falls, not safe to be alone, and concern for safely navigating or managing the home environment     PLAN  Recommendations and Planned Interventions:   self care training, therapeutic activities, functional mobility training, balance training, therapeutic exercise, endurance activities, cognitive retraining, patient education, and home safety training    Frequency/Duration: 3-5x/week    Recommend with staff: Out of bed to chair for meals, Encourage HEP in prep for ADLs/mobility, Frequent repositioning to prevent skin breakdown, Amb to bathroom for toileting with gt belt and AD, and Amb in hallway    Recommend next OT session: Toileting and LB dressing    Recommendation for discharge: (in order for the patient to meet his/her long term goals): Therapy up to 5 days/week in Skilled nursing facility    IF patient discharges home will need the following DME: continuing to assess with progress       SUBJECTIVE:   Patient stated "Nobody helps me, I do everything on my own."    OBJECTIVE DATA SUMMARY:   Cognitive/Behavioral Status:  Orientation  Overall Orientation Status: Within Normal Limits  Orientation Level: Oriented X4  Cognition  Overall Cognitive Status: Exceptions  Arousal/Alertness: Appropriate responses to stimuli  Following Commands: Follows multistep commands with increased time  Attention Span: Difficulty dividing attention  Memory: Decreased recall of precautions  Safety Judgement: Decreased awareness of need  for safety  Problem Solving: Assistance required to identify errors made;Decreased awareness of errors  Insights: Decreased awareness of  deficits  Initiation: Requires cues for some  Sequencing: Requires cues for some    Functional Mobility and Transfers for ADLs:  Bed Mobility:  Bed Mobility Training  Bed Mobility Training: Yes  Overall Level of Assistance: Minimum assistance;Assist X1  Interventions: Verbal cues;Tactile cues;Safety awareness training  Rolling: Minimum assistance  Supine to Sit: Minimum assistance;Additional time  Scooting: Minimum assistance;Additional time    Transfers:  Transfer Training  Sit to Stand: Minimum assistance;Additional time  Stand to Sit: Minimum assistance;Additional time    Balance:  Balance  Sitting: Impaired  Sitting - Static: Good (unsupported)  Sitting - Dynamic: Fair (occasional)  Standing: Impaired  Standing - Static: Fair;Constant support  Standing - Dynamic: Fair;Constant support    ADL Intervention:  LE Dressing: Moderate assistance   LE Dressing Skilled Clinical Factors: Able to adjust R sock with mod I while semi supine in bed and A to adjust L sock d/t posterior hip precautions.     Toileting: Dependent/Total   Toileting Skilled Clinical Factors: Total A req'd for posterior peri care while standing with RW with CGA.     Function Measures:  KB Home	[place] AM-PACTM "6 Clicks"                                                       Daily Activity Inpatient Short Form  How much help from another person does the patient currently need... Total; A Lot A Little None   1.  Putting on and taking off regular lower body clothing? []   1 [x]   2 []   3 []   4   2.  Bathing (including washing, rinsing, drying)? []   1 [x]   2 []   3 []   4   3.  Toileting, which includes using toilet, bedpan or urinal? [x]  1 []   2 []   3 []   4   4.  Putting on and taking off regular upper body clothing? []   1 []   2 [x]   3 []   4   5.  Taking care of personal grooming such as brushing teeth? []   1 []   2 [x]   3 []   4   6.  Eating meals? []   1 []   2 []   3 [x]   4    2007, [employer], under license to [employer]. All rights  reserved     Score: 15/24     Interpretation of Tool:  Represents clinically-significant functional categories (i.e. Activities of daily living).  Percentage of Impairment CH    0%   CI    1-19% CJ    20-39% CK    40-59% CL    60-79% CM    80-99% CN     100%   AMPAC  Score 6-24 24 23  20-22 15-19 10-14 7-9 6       Pain Rating:  8/10  LLE    Activity Tolerance:   Fair  and requires rest breaks  Please refer to the flowsheet for vital signs taken during this treatment.    After treatment:   Bed locked and in lowest position Patient left in no apparent distress sitting up in chair and Call bell within reach and nsg updated.  COMMUNICATION/EDUCATION:   The patient's plan of care was discussed with: Physical therapy assistant and Certified nursing assistant/patient care technician    Patient Education  Education Given To: Patient  Education Provided: Role of Therapy;Energy Conservation;Fall Prevention Strategies;ADL Adaptive Strategies;Equipment;Transfer Training  Education Method: Demonstration;Verbal  Barriers to Learning: Cognition  Education Outcome: Verbalized understanding;Continued education needed    The supervising occupational therapist and treating occupational therapist assistant have met to review this patient's progress and plan of care.    This patient's plan of care is appropriate for delegation to OTA.     PT/OT sessions occurred together for increased safety of pt and clinician.     Thank you for this referral.  Ambrose Mantle, OT  Minutes: 28     Thank you for this referral.  Ambrose Mantle, OT  Minutes: 28   Problem: Occupational Therapy - Adult  Goal: By Discharge: Performs self-care activities at highest level of function for planned discharge setting.  See evaluation for individualized goals.  Description: Description: FUNCTIONAL STATUS PRIOR TO ADMISSION: Pt states she was ind with ADLs with no AD PTA.     HOME SUPPORT: Lives alone    Occupational Therapy Goals  Initiated 05/27/2021    Pt stated  goal: To return to PLOF  Pt will be Mod I sup <> sit in prep for EOB ADLs  Pt will be Mod I grooming standing sink side LRAD  Pt will be Mod I UB dressing sitting EOB/long sit   Pt will be Mod I LE dressing sitting EOB/long sit  Pt will be Mod I sit <> stand in prep for toileting LRAD  Pt will be Mod I toileting/toilet transfer/cloth mgmt LRAD    Outcome: Progressing

## 2021-06-08 NOTE — Progress Notes (Addendum)
CM reviewed medical record. CM called [employer] to inquire about auth for patient to go to [facility]. [employer] states this should go to the medical director today for approval and the hospital should hear something this afternoon. CM in to patient room to explain to patient what the hold up is. Patient asked what happens if she is denied for Nursing home. I told her we would have to send her home. Patient verbalized understanding.     UAI completed. Awaiting MD approval.     1310 pm  Approved UAI sent to [employer].

## 2021-06-08 NOTE — Progress Notes (Signed)
[facility] Hospitalist Progress Note       Hospitalization course:  12/01/20 echocardiogram    Left Ventricle: Low normal left ventricular systolic function with a visually estimated EF of 50 - 55%. Left ventricle is smaller than normal. Mild septal thickening. Mild posterior thickening. See diagram for wall motion findings. Grade I diastolic dysfunction with normal LAP.    Right Ventricle: Reduced systolic function.    Aortic Valve: Tricuspid valve. Mild sclerosis of the aortic valve cusp. Mild regurgitation.    Mitral Valve: Valve structure is normal. Mild annular calcification at the posterior leaflet of the mitral valve. Mild to moderate paravalvular regurgitation.    Right Atrium: Right atrium is mildly dilated.    Technical qualifiers: Echo study was technically difficult due to patient's body habitus.    05/24/21 admission course  [name] is a 76 y.o. female with PMH of COPD, HLP and recent fall with left femur fracture s/p ORIF of the left hip. She presented to the ED with chief complaint of fall and left hip pain. She reports mechanical fall when walking at home today,  was not using walker. She denies LOC, lightheadedness or vertigo prior to fall. No focal neurology symptoms. She report left hip pain, severe in nature, worsened with movement.     In the ED, noted hypertensive. X-ray showed acute fracture of the left femoral greater and lesser trochanters.    Brief Postoperative Note   Patient: [name]  Date of Birth: [birth year]  MRN: [id number]   Date of Procedure: 05/26/2021    Pre-Op Diagnosis: Left hip periprosthetic fracture with loose stem   Post-Op Diagnosis: Same as above   Procedure(s):  LEFT HIP REVISION POSTERIOR APPROACH WITH ALLOGRAFT   Surgeon(s):  Helene Kelp, MD   Surgical Assistant: Surg Asst-1: Jalene Mullet; Larene Pickett   Anesthesia: General    Estimated Blood Loss (mL): 200   Complications: None    05/31/21 orthopedics  Status post removal hardware/ORIF revision left hip  hemiarthroplasty.  Postop day #5.  Continue with therapy as tolerated.  Continue with aspirin for DVT prophylaxis.  Will increase tramadol to 50 to 100 mg every 6 hours as needed pain.  Awaiting rehab placement.  Okay to discharge from orthopedics when cleared by medicine and placement has been made.  Patient will follow-up with Dr. Gerda Diss as outpatient on Jun 10, 2021    06/06/21 no new complaints, tolerating medications well  Postop course progressing well  Rehab vs SNF placement anticipated    Subjective        06/07/21-patient seen for the first time, chart was reviewed, patient sitting in bed, BP soft but asymptomatic.  Waiting for placement at this time    06/08/21-patient seen and examined in room, sitting on the chair.  No pain issues.  Blood pressure soft but asymptomatic.  Waiting for placement with insurance issues been sorted out.    Objective         Vitals Last 24 Hours:  TEMPERATURE:  Temp  Avg: 98.1 F (36.7 C)  Min: 97.9 F (36.6 C)  Max: 98.2 F (36.8 C)  RESPIRATIONS RANGE: Resp  Avg: 17  Min: 16  Max: 18  PULSE OXIMETRY RANGE: SpO2  Avg: 95.6 %  Min: 94 %  Max: 97 %  PULSE RANGE: Pulse  Avg: 88.9  Min: 82  Max: 96  BLOOD PRESSURE RANGE: Systolic (24hrs), Avg:98 , Min:87 , Max:106   ; Diastolic (24hrs), Avg:58, Min:50, Max:64    I/O (  24Hr):    Intake/Output Summary (Last 24 hours) at 06/08/2021 1059  Last data filed at 06/08/2021 0705  Gross per 24 hour   Intake 240 ml   Output 950 ml   Net -710 ml     Vitals:    06/07/21 2123 06/08/21 0222 06/08/21 0928 06/08/21 0931   BP:  (!) 96/55     Pulse: 92 82 96 95   Resp: 18 18 16 16    Temp:  97.9 F (36.6 C)     TempSrc:  Oral     SpO2: 96% 95% 96% 97%   Weight:       Height:          Objective:  General Appearance:  Comfortable.    Vital signs: (most recent): Blood pressure (!) 96/55, pulse 95, temperature 97.9 F (36.6 C), temperature source Oral, resp. rate 16, height 1.6 m (5' 2.99"), weight 51.7 kg (113 lb 15.7 oz), SpO2 97 %.    General: Thin  built and nourished  HEENT: moist oral mucosa   CV: RRR,   Respiratory: Symmetric expansion  Abdomen: nondistended, nontender  Extremities: no peripheral edema   Musculoskeletal:  L DROM, dressing in place    Neuro: alert, normal speech     ASSESSMENT AND PLAN    1) Left femur acute fracture s/p operative management as above              DVT Prx on asa   Cleared per orthopedics   Physical therapy   Awaiting rehab placement   Outpatient follow up with orthopedics 06/10/21    2) Cardiovascular issues including hypertension and dyslipidemia     Echocardiogram as above   Medical management    Aspirin    Atorvastatin    Metoprolol dose adjusted    3) COPD not in acute exacerbation    4) Dispo IRF vs SNF anticipated        Current Facility-Administered Medications:     [START ON 06/09/2021] metoprolol succinate (TOPROL XL) extended release tablet 12.5 mg, 12.5 mg, Oral, Daily, Tatum Massman 06/11/2021, MD    melatonin tablet 5 mg, 5 mg, Oral, Nightly PRN, Kshitij Sharma, MD, 5 mg at 06/07/21 2126    acetaminophen (TYLENOL) tablet 1,000 mg, 1,000 mg, Oral, Q6H, Kshitij Sharma, MD, 1,000 mg at 06/08/21 0700    albuterol sulfate HFA (PROVENTIL;VENTOLIN;PROAIR) 108 (90 Base) MCG/ACT inhaler 2 puff, 2 puff, Inhalation, Q4H PRN, Kshitij Sharma, MD    aspirin chewable tablet 81 mg, 81 mg, Oral, BID, Kshitij Sharma, MD, 81 mg at 06/07/21 2120    atorvastatin (LIPITOR) tablet 40 mg, 40 mg, Oral, Nightly, Kshitij Sharma, MD, 40 mg at 06/07/21 2120    budesonide-formoterol (SYMBICORT) 160-4.5 MCG/ACT inhaler 2 puff, 2 puff, Inhalation, BID, 2 puff at 06/08/21 0928 **AND** tiotropium (SPIRIVA RESPIMAT) 2.5 MCG/ACT inhaler 2 puff, 2 puff, Inhalation, Daily, Kshitij Sharma, MD, 2 puff at 06/08/21 06/10/21    celecoxib (CELEBREX) capsule 100 mg, 100 mg, Oral, BID, Kshitij Sharma, MD, 100 mg at 06/07/21 2120    ondansetron (ZOFRAN-ODT) disintegrating tablet 4 mg, 4 mg, Oral, Q6H PRN **OR** ondansetron (ZOFRAN) injection 4 mg, 4 mg, IntraVENous, Q6H PRN,  Kshitij Sharma, MD    pantoprazole (PROTONIX) tablet 40 mg, 40 mg, Oral, QAM AC, Kshitij Sharma, MD, 40 mg at 06/08/21 0700    polyethylene glycol (GLYCOLAX) packet 17 g, 17 g, Oral, Daily, Kshitij Sharma, MD, 17 g at 06/05/21 0852    sennosides-docusate sodium (SENOKOT-S) 8.6-50 MG  tablet 1 tablet, 1 tablet, Oral, BID, Kshitij Sharma, MD, 1 tablet at 06/07/21 2120    sodium chloride flush 0.9 % injection 5-40 mL, 5-40 mL, IntraVENous, 2 times per day, Kshitij Sharma, MD, 10 mL at 06/07/21 2121    sodium chloride flush 0.9 % injection 5-40 mL, 5-40 mL, IntraVENous, PRN, Kshitij Sharma, MD    0.9 % sodium chloride infusion, , IntraVENous, PRN, Kshitij Raleigh CallasSharma, MD    traMADol Janean Sark(ULTRAM) tablet 50 mg, 50 mg, Oral, Q6H PRN, Demetria PoreKshitij Sharma, MD, 50 mg at 06/07/21 2126       Signed Valetta CloseSami Faryal Marxen, MD

## 2021-06-08 NOTE — Plan of Care (Signed)
PHYSICAL THERAPY TREATMENT     Patient: [name] (76 y.o. female)  Date: 06/08/2021  Diagnosis: Femur fracture (HCC) [S72.90XA] <principal problem not specified>      Precautions:               Hip Precautions: Posterior hip precautions  In place during session: External Catheter  Chart, physical therapy assessment, plan of care and goals were reviewed.    ASSESSMENT  Patient continues with skilled PT services and is progressing towards goals. Pt supine in bed upon PT arrival, agreeable to session. Pt A&O x 4.  Pt's overall mobility is Min A/CGA. Pt ambulated approximately 30 ft in the hallway and then sat in recliner chair due to having a bowel movement. Gait is slow and steady with no LOB noted. (See below for objective details and assist levels). Overall pt tolerated session fair today. Will continue to benefit from skilled PT services, and will continue to progress as tolerated.     Current Level of Function Impacting Discharge (mobility/balance): Min A/CGA    Other factors to consider for discharge: high risk for falls       PLAN :  Patient continues to benefit from skilled intervention to address decreased activity tolerance and increased pain levels. Continue treatment per established plan of care to address goals.    Recommend with staff: Out of bed to chair for meals, Encourage HEP in prep for ADLs/mobility, and Amb to bathroom for toileting with gt belt and AD    Recommendation for discharge: (in order for the patient to meet his/her long term goals)  Skilled Nursing Facility    IF patient discharges home will need the following DME: TBD       SUBJECTIVE:   Patient stated "I don't know when I have to go to the bathroom all the time."    OBJECTIVE DATA SUMMARY:   Critical Behavior:  Orientation  Overall Orientation Status: Within Normal Limits  Cognition  Overall Cognitive Status: WNL  Arousal/Alertness: Appropriate responses to stimuli  Following Commands: Follows multistep commands with  increased time  Attention Span: Difficulty dividing attention  Memory: Decreased recall of precautions  Safety Judgement: Good awareness of safety precautions  Problem Solving: Assistance required to identify errors made  Insights: Decreased awareness of deficits  Initiation: Requires cues for some  Sequencing: Requires cues for some    Functional Mobility Training:  Bed Mobility:     Transfers:  Transfer Training  Sit to Stand: Minimum assistance;Additional time  Stand to Sit: Minimum assistance;Additional time  Balance:  Balance  Sitting: Impaired  Sitting - Static: Good (unsupported)  Sitting - Dynamic: Fair (occasional)  Standing: Impaired  Standing - Static: Fair;Constant support  Standing - Dynamic: Fair;Constant support  Wheelchair Mobility:     Ambulation/Gait Training:     Gait  Overall Level of Assistance: Contact-guard assistance  Interventions: Verbal cues;Tactile cues;Safety awareness training  Base of Support: Widened  Speed/Cadence: Slow  Step Length: Left shortened;Right shortened  Gait Abnormalities: Step to gait  Distance (ft): 30 Feet  Assistive Device: Walker, rolling;Gait belt     Pain Rating:  8/10 LLE    Activity Tolerance:   Fair     After treatment patient left in no apparent distress:   Bed locked and returned to lowest position, Patient left in no apparent distress sitting up in chair and Call bell within reach, and nsg updated     COMMUNICATION/COLLABORATION:   The patient's plan of care was discussed with: Registered  nurse    Patient Education  Education Given To: Patient  Education Provided: Plan of Care  Education Method: Demonstration;Verbal  Barriers to Learning: Lack of Family Support  Education Outcome: Verbalized understanding;Demonstrated understanding    PT/OT sessions occurred together for increased safety of pt and clinician.     Molinda Bailiff, PTA  Minutes: 26           Problem: Physical Therapy - Adult  Goal: By Discharge: Performs mobility at highest level of function for  planned discharge setting.  See evaluation for individualized goals.  Description: Problem: Mobility Impaired (Adult and Pediatric)  Goal: *Acute Goals and Plan of Care (Insert Text)  Description: FUNCTIONAL STATUS PRIOR TO ADMISSION: Patient was independent and active without use of DME.    HOME SUPPORT PRIOR TO ADMISSION: The patient lived alone with no local support.    Physical Therapy Goals  Initiated 05/27/2021  Patient/family stated goal: to get stronger and walk   1. Patient will move from supine to sit and sit to supine in bed with supervision/set-up within 7 day(s).   2. Patient will transfer from bed to chair and chair to bed with supervision/set-up using the least restrictive device within 7 day(s).  3. Patient will perform sit to stand with supervision/set-up within 7 day(s).  4. Patient will ambulate with supervision/set-up for 25 feet with the least restrictive device within 7 day(s).   5. Patient will ascend/descend 4 stairs with B handrail(s) with supervision/set-up within 7 day(s).  6. Patient will participate in lower extremity therapeutic exercise/activities with supervision/set-up for 10 minutes within 7 day(s).     Outcome: Progressing

## 2021-06-08 NOTE — Plan of Care (Signed)
Problem: Safety - Adult  Goal: Free from fall injury  Outcome: Progressing     Problem: Pain  Goal: Verbalizes/displays adequate comfort level or baseline comfort level  Outcome: Progressing

## 2021-06-08 NOTE — Discharge Summary (Signed)
Hospitalist Discharge Summary     Patient ID:  [name] Kichline  [id number]  76 y.o.  1945/10/06  05/24/2021    PCP on record: Diona Browneraniela G Cioflec, MD    Admit date: 05/24/2021  Discharge date and time: 06/08/2021    DISCHARGE DIAGNOSIS:  Left femur fracture  HTN      CONSULTATIONS:  IP CONSULT TO OCCUPATIONAL THERAPY  IP CONSULT TO PHYSICAL THERAPY    Excerpted HPI from H&P of Caitlin BarrioMing Han Cha, MD:     [name] Wiebelhaus is a 76 y.o. female with PMH of COPD, HLP and recent fall with left femur fracture s/p ORIF of the left hip. She presented to the ED with chief complaint of fall and left hip pain. She reports mechanical fall when walking at home today,  was not using walker. She denies LOC, lightheadedness or vertigo prior to fall. No focal neurology symptoms. She report left hip pain, severe in nature, worsened with movement.       In the ED, noted hypertensive. X-ray showed acute fracture of the left femoral greater and lesser trochanters.        ______________________________________________________________________  DISCHARGE SUMMARY/HOSPITAL COURSE:  for full details see H&P, daily progress notes, labs, consult notes.     05/24/21 admission course  [name] is a 76 y.o. female with PMH of COPD, HLP and recent fall with left femur fracture s/p ORIF of the left hip. She presented to the ED with chief complaint of fall and left hip pain. She reports mechanical fall when walking at home today,  was not using walker. She denies LOC, lightheadedness or vertigo prior to fall. No focal neurology symptoms. She report left hip pain, severe in nature, worsened with movement.     In the ED, noted hypertensive. X-ray showed acute fracture of the left femoral greater and lesser trochanters.     Brief Postoperative Note   Patient: [name] [place]  Date of Birth: 1945/10/06  MRN: [id number]   Date of Procedure: 05/26/2021    Pre-Op Diagnosis: Left hip periprosthetic fracture with loose stem   Post-Op Diagnosis: Same as above    Procedure(s):  LEFT HIP REVISION POSTERIOR APPROACH WITH ALLOGRAFT   Surgeon(s):  Helene KelpGrenier, Eric, MD   Surgical Assistant: [name], Jacob; [name], Terry Robert   Anesthesia: General    Estimated Blood Loss (mL): 200   Complications: None     05/31/21 orthopedics  Status post removal hardware/ORIF revision left hip hemiarthroplasty.  Postop day #5.  Continue with therapy as tolerated.  Continue with aspirin for DVT prophylaxis.  Will increase tramadol to 50 to 100 mg every 6 hours as needed pain.  Awaiting rehab placement.  Okay to discharge from orthopedics when cleared by medicine and placement has been made.  Patient will follow-up with Dr. Gerda DissGrenier as outpatient on Jun 10, 2021     06/06/21 no new complaints, tolerating medications well  Postop course progressing well  Rehab vs SNF placement anticipated         06/07/21-patient seen for the first time, chart was reviewed, patient sitting in bed, BP soft but asymptomatic.  Waiting for placement at this time     06/08/21-patient seen and examined in room, sitting on the chair.  No pain issues.  Blood pressure soft but asymptomatic.  Waiting for placement with insurance issues been sorted out.      ASSESSMENT AND PLAN     1) Left femur acute fracture s/p operative management as above  DVT Prx on asa              Cleared per orthopedics              Physical therapy              Awaiting rehab placement              Outpatient follow up with orthopedics 06/10/21     2) Cardiovascular issues including hypertension and dyslipidemia                 Echocardiogram as above              Medical management                          Aspirin                          Atorvastatin                          Metoprolol dose adjusted     3) COPD not in acute exacerbation     4) Dispo IRF vs SNF anticipated         _______________________________________________________________________  Patient seen and examined by me on discharge day.  Patient has been waiting for  insurance authorization which was obtained.  Case management has been in touch with SNF if bed available she will be transferred today.  Please see my progress note for details.    _______________________________________________________________________  DISCHARGE MEDICATIONS:      Medication List        START taking these medications      aspirin 81 MG chewable tablet  Take 1 tablet by mouth in the morning and at bedtime     celecoxib 100 MG capsule  Commonly known as: CELEBREX  Take 1 capsule by mouth 2 times daily            CHANGE how you take these medications      metoprolol succinate 25 MG extended release tablet  Commonly known as: TOPROL XL  Take 0.5 tablets by mouth daily  Start taking on: Jun 09, 2021  What changed: how much to take            CONTINUE taking these medications      albuterol sulfate HFA 108 (90 Base) MCG/ACT inhaler  Commonly known as: PROVENTIL;VENTOLIN;PROAIR     atorvastatin 40 MG tablet  Commonly known as: LIPITOR     omeprazole 20 MG delayed release capsule  Commonly known as: PRILOSEC     polyethylene glycol 17 GM/SCOOP powder  Commonly known as: GLYCOLAX     senna-docusate 8.6-50 MG per tablet  Commonly known as: PERICOLACE     Trelegy Ellipta 200-62.5-25 MCG/ACT Aepb inhaler  Generic drug: fluticasone-umeclidin-vilant            STOP taking these medications      acetaminophen 325 MG tablet  Commonly known as: TYLENOL               Where to Get Your Medications        [facility] [address] [place], [place] - [address] - P [phone] - F [phone]  [address] [name] [place] [address]      Phone: [phone]   aspirin 81 MG chewable  tablet  celecoxib 100 MG capsule  metoprolol succinate 25 MG extended release tablet           Patient Follow Up Instructions:     Diet- cont current orders  Activity-as per Ortho instructions  CBC and BMP in 1 to 2 weeks  Strict fall and aspiration cautions  Return to ED or call  911 immediately symptoms recur      Helene Kelp, MD  [address]  [address]  [place] [address]  [phone]    Follow up on 06/10/2021       ________________________________________________________________      Condition at Discharge:  Stable  __________________________________________________________________    Disposition- SNF if bed available    ____________________________________________________________________    Code Status: Full Code   ___________________________________________________________________      Total time in minutes spent coordinating this discharge:  35 minutes    Signed:  Valetta Close, MD

## 2021-06-08 NOTE — Plan of Care (Signed)
Problem: Pain  Goal: Verbalizes/displays adequate comfort level or baseline comfort level  Outcome: Progressing     Problem: Occupational Therapy - Adult  Goal: By Discharge: Performs self-care activities at highest level of function for planned discharge setting.  See evaluation for individualized goals.  Description: Description: FUNCTIONAL STATUS PRIOR TO ADMISSION: Pt states she was ind with ADLs with no AD PTA.     HOME SUPPORT: Lives alone    Occupational Therapy Goals  Initiated 05/27/2021    Pt stated goal: To return to PLOF  Pt will be Mod I sup <> sit in prep for EOB ADLs  Pt will be Mod I grooming standing sink side LRAD  Pt will be Mod I UB dressing sitting EOB/long sit   Pt will be Mod I LE dressing sitting EOB/long sit  Pt will be Mod I sit <> stand in prep for toileting LRAD  Pt will be Mod I toileting/toilet transfer/cloth mgmt LRAD    06/08/2021 1328 by Meda Coffee, OT  Outcome: Progressing     Problem: Occupational Therapy - Adult  Goal: By Discharge: Performs self-care activities at highest level of function for planned discharge setting.  See evaluation for individualized goals.  Description: Description: FUNCTIONAL STATUS PRIOR TO ADMISSION: Pt states she was ind with ADLs with no AD PTA.     HOME SUPPORT: Lives alone    Occupational Therapy Goals  Initiated 05/27/2021    Pt stated goal: To return to PLOF  Pt will be Mod I sup <> sit in prep for EOB ADLs  Pt will be Mod I grooming standing sink side LRAD  Pt will be Mod I UB dressing sitting EOB/long sit   Pt will be Mod I LE dressing sitting EOB/long sit  Pt will be Mod I sit <> stand in prep for toileting LRAD  Pt will be Mod I toileting/toilet transfer/cloth mgmt LRAD    06/08/2021 1328 by Meda Coffee, OT  Outcome: Progressing     Problem: Safety - Adult  Goal: Free from fall injury  Outcome: Progressing     Problem: Musculoskeletal - Adult  Goal: Return mobility to safest level of function  Outcome: Progressing

## 2021-06-08 NOTE — Plan of Care (Signed)
Problem: Hematologic - Adult  Goal: Maintains hematologic stability  Outcome: Progressing  Flowsheets (Taken 06/07/2021 2110)  Maintains hematologic stability:   Assess for signs and symptoms of bleeding or hemorrhage   Monitor labs for bleeding or clotting disorders

## 2021-06-09 MED ORDER — MELATONIN 5 MG PO TABS
5 MG | ORAL_TABLET | Freq: Every evening | ORAL | 0 refills | Status: AC | PRN
Start: 2021-06-09 — End: 2021-10-21

## 2021-06-09 MED ORDER — ASPIRIN 81 MG PO TBEC
81 MG | ORAL_TABLET | Freq: Every day | ORAL | 3 refills | Status: AC
Start: 2021-06-09 — End: 2022-03-01

## 2021-06-09 MED FILL — PANTOPRAZOLE SODIUM 40 MG PO TBEC: 40 MG | ORAL | Qty: 1

## 2021-06-09 MED FILL — STIMULANT LAXATIVE 8.6-50 MG PO TABS: ORAL | Qty: 1

## 2021-06-09 MED FILL — ASPIRIN LOW STRENGTH 81 MG PO CHEW: 81 MG | ORAL | Qty: 1

## 2021-06-09 MED FILL — ACETAMINOPHEN EXTRA STRENGTH 500 MG PO TABS: 500 MG | ORAL | Qty: 2

## 2021-06-09 MED FILL — SPIRIVA RESPIMAT 2.5 MCG/ACT IN AERS: 2.5 MCG/ACT | RESPIRATORY_TRACT | Qty: 4

## 2021-06-09 MED FILL — SYMBICORT 160-4.5 MCG/ACT IN AERO: 160-4.5 MCG/ACT | RESPIRATORY_TRACT | Qty: 6

## 2021-06-09 MED FILL — CELECOXIB 100 MG PO CAPS: 100 MG | ORAL | Qty: 1

## 2021-06-09 MED FILL — TRAMADOL HCL 50 MG PO TABS: 50 MG | ORAL | Qty: 1

## 2021-06-09 MED FILL — MELATONIN 5 MG PO TABS: 5 MG | ORAL | Qty: 1

## 2021-06-09 MED FILL — ATORVASTATIN CALCIUM 40 MG PO TABS: 40 MG | ORAL | Qty: 1

## 2021-06-09 MED FILL — ONDANSETRON HCL 4 MG/2ML IJ SOLN: 4 MG/2ML | INTRAMUSCULAR | Qty: 2

## 2021-06-09 MED FILL — METOPROLOL SUCCINATE ER 25 MG PO TB24: 25 MG | ORAL | Qty: 1

## 2021-06-09 NOTE — Progress Notes (Signed)
Patient accepted with [facility]. Anticipated start of care is 06/14/21.

## 2021-06-09 NOTE — Progress Notes (Addendum)
CM called [employer] this afternoon to discuss auth started on Monday for SNF placement at [facility]. [employer] medical director declined SNF. CM in to tell patient of above. Patient declined to appeal stating she was not surprised they denied it. CM offered patient [facility] services and patient agreed for [facility] services. Choice letter signed and referral sent.     Transition of Care Plan:    RUR: 10%  Prior Level of Functioning: needed some assist  Disposition: home  If SNF or IPR: Date FOC offered: 05/24/21  Date FOC received: 05/24/21  Accepting facility:   Date authorization started with reference number: 06/07/54  Date authorization received and expires:   Follow up appointments: yes  DME needed: n/a  Transportation at discharge: friend  IM/IMM Medicare/Tricare letter given: yes  Is patient a Veteran and connected with VA? no   If yes, was [employer] transfer form completed and VA notified?   Caregiver Contact: n/a  Discharge Caregiver contacted prior to discharge? Patient called her friend  Care Conference needed? no  Barriers to discharge:   No    Awaiting acceptance for [facility] services. CM searched through PT/OT notes. Recs for DME all state TBD.

## 2021-06-09 NOTE — Discharge Summary (Signed)
Hospitalist Discharge Summary     Patient ID:  [name]  [id number]  76 y.o.  Feb 21, 1945  05/24/2021    PCP on record: Diona Browner, MD    Admit date: 05/24/2021  Discharge date and time: 06/09/2021    DISCHARGE DIAGNOSIS:  Left femur fracture  HTN      CONSULTATIONS:  IP CONSULT TO OCCUPATIONAL THERAPY  IP CONSULT TO PHYSICAL THERAPY    Excerpted HPI from H&P of Theotis Barrio, MD:     [name] is a 76 y.o. female with PMH of COPD, HLP and recent fall with left femur fracture s/p ORIF of the left hip. She presented to the ED with chief complaint of fall and left hip pain. She reports mechanical fall when walking at home today,  was not using walker. She denies LOC, lightheadedness or vertigo prior to fall. No focal neurology symptoms. She report left hip pain, severe in nature, worsened with movement.       In the ED, noted hypertensive. X-ray showed acute fracture of the left femoral greater and lesser trochanters.      Discharge summary was already done by my colleague Dr. Valetta Close.  Patient was awaiting only for the bed status, bed was available today so she is being discharged.  ______________________________________________________________________  DISCHARGE SUMMARY/HOSPITAL COURSE:  for full details see H&P, daily progress notes, labs, consult notes.     05/24/21 admission course  [name] is a 76 y.o. female with PMH of COPD, HLP and recent fall with left femur fracture s/p ORIF of the left hip. She presented to the ED with chief complaint of fall and left hip pain. She reports mechanical fall when walking at home today,  was not using walker. She denies LOC, lightheadedness or vertigo prior to fall. No focal neurology symptoms. She report left hip pain, severe in nature, worsened with movement.     In the ED, noted hypertensive. X-ray showed acute fracture of the left femoral greater and lesser trochanters.     Brief Postoperative Note   Patient: [name]  Date of Birth:  1945/05/29  MRN: [id number]   Date of Procedure: 05/26/2021    Pre-Op Diagnosis: Left hip periprosthetic fracture with loose stem   Post-Op Diagnosis: Same as above   Procedure(s):  LEFT HIP REVISION POSTERIOR APPROACH WITH ALLOGRAFT   Surgeon(s):  Helene Kelp, MD   Surgical Assistant: Surg Asst-1: Jalene Mullet; Larene Pickett   Anesthesia: General    Estimated Blood Loss (mL): 200   Complications: None     05/31/21 orthopedics  Status post removal hardware/ORIF revision left hip hemiarthroplasty.  Postop day #5.  Continue with therapy as tolerated.  Continue with aspirin for DVT prophylaxis.  Will increase tramadol to 50 to 100 mg every 6 hours as needed pain.  Awaiting rehab placement.  Okay to discharge from orthopedics when cleared by medicine and placement has been made.  Patient will follow-up with Dr. Gerda Diss as outpatient on Jun 10, 2021     06/06/21 no new complaints, tolerating medications well  Postop course progressing well  Rehab vs SNF placement anticipated         06/07/21-patient seen for the first time, chart was reviewed, patient sitting in bed, BP soft but asymptomatic.  Waiting for placement at this time     06/08/21-patient seen and examined in room, sitting on the chair.  No pain issues.  Blood pressure soft but asymptomatic.  Waiting for placement with insurance  issues been sorted out.      ASSESSMENT AND PLAN     1) Left femur acute fracture s/p operative management as above              DVT Prx on asa              Cleared per orthopedics              Physical therapy              Awaiting rehab placement              Outpatient follow up with orthopedics 06/10/21     2) Cardiovascular issues including hypertension and dyslipidemia                 Echocardiogram as above              Medical management                          Aspirin                          Atorvastatin                          Metoprolol dose adjusted     3) COPD not in acute exacerbation     4) Dispo IRF vs SNF  anticipated         _______________________________________________________________________  Patient seen and examined by me on discharge day.  Patient has been waiting for insurance authorization which was obtained.  Case management has been in touch with SNF if bed available she will be transferred today.  Please see my progress note for details.    _______________________________________________________________________  DISCHARGE MEDICATIONS:      Medication List        START taking these medications      celecoxib 100 MG capsule  Commonly known as: CELEBREX  Take 1 capsule by mouth 2 times daily     melatonin 5 MG Tabs tablet  Take 1 tablet by mouth nightly as needed (sleep)            CHANGE how you take these medications      metoprolol succinate 25 MG extended release tablet  Commonly known as: TOPROL XL  Take 0.5 tablets by mouth daily  What changed: how much to take            CONTINUE taking these medications      albuterol sulfate HFA 108 (90 Base) MCG/ACT inhaler  Commonly known as: PROVENTIL;VENTOLIN;PROAIR     aspirin 81 MG EC tablet  Take 1 tablet by mouth daily     omeprazole 20 MG delayed release capsule  Commonly known as: PRILOSEC     polyethylene glycol 17 GM/SCOOP powder  Commonly known as: GLYCOLAX     rosuvastatin 10 MG tablet  Commonly known as: CRESTOR     senna-docusate 8.6-50 MG per tablet  Commonly known as: PERICOLACE     Trelegy Ellipta 200-62.5-25 MCG/ACT Aepb inhaler  Generic drug: fluticasone-umeclidin-vilant            STOP taking these medications      acetaminophen 325 MG tablet  Commonly known as: TYLENOL     atorvastatin 40 MG tablet  Commonly known as: LIPITOR  Where to Get Your Medications        [id number] [place], [place] - [address] - P [phone] - F [phone]  [address] [place] [address]      Phone: [phone]   aspirin 81 MG EC tablet  celecoxib 100 MG  capsule  metoprolol succinate 25 MG extended release tablet       Information about where to get these medications is not yet available    Ask your nurse or doctor about these medications  melatonin 5 MG Tabs tablet           Patient Follow Up Instructions:     Diet- cont current orders  Activity-as per Ortho instructions  CBC and BMP in 1 to 2 weeks  Strict fall and aspiration cautions  Return to ED or call 911 immediately symptoms recur      Helene Kelp, MD  [address]  [address]  [place] [address]  [phone]    Follow up on 06/10/2021      Diona Browner, MD  [address]  [address]  [place] [address]  [phone]    Follow up in 1 week(s)  Postdischarge hospital follow-up care     ________________________________________________________________      Condition at Discharge:  Stable  __________________________________________________________________    Disposition- SNF if bed available.     ____________________________________________________________________    Code Status: Full Code   ___________________________________________________________________      Total time in minutes spent coordinating this discharge:  35 minutes    Signed:  [name], MD

## 2021-06-14 NOTE — Telephone Encounter (Signed)
[place] from [facility] called to advise that the patient had fallen over the weekend. States the patient has been discharged and is at home but is high risk for falls. Patient is only taking Tylenol. Please contact the patient at [phone] or Barnett Applebaum (care advocate) at [phone] to discuss pain levels.

## 2021-06-15 NOTE — Telephone Encounter (Signed)
[name] called in stating that the patient [facility]. I made her aware that the nurse has already spoken to Dr. Salli Quarry and he will be discussing the options and answer patient questions at appointment on Friday

## 2021-06-15 NOTE — Telephone Encounter (Signed)
Ellwood Dense called stating patient is experiencing pain and foot is swollen. Says patient is taking Tylenol for pain but its not helping. [facility] [place] [place] if it was called in. Please call her at [phone] if needed.

## 2021-06-15 NOTE — Telephone Encounter (Signed)
Spoke w/ Dr Salli Quarry, will discuss pain levels and in home options with patient on Friday when in the office and call the patient with answers.

## 2021-06-17 NOTE — Telephone Encounter (Signed)
Formatting of this note might be different from the original.  Returned the call to [employer] but she was not available. Her husband asked if I would call back later. I then tried to contact Ms. Zettlemoyer but the phone was busy.  Electronically signed by Belinda Fisher, RN at 06/17/2021  9:33 AM EDT

## 2021-06-23 NOTE — Telephone Encounter (Signed)
[name] from [facility] called in to clarify weight bearing status and surgical precautions so they can know what [name]  can do during PT. Please call back when able at [phone].

## 2021-06-24 NOTE — Telephone Encounter (Signed)
Tabitha from [facility] called to obtain orders for PT and for the nurse. Says patient has stitches that needs to come out and unable to ambulate and needs orders asap. Please call her at [phone] when available.

## 2021-06-27 NOTE — Telephone Encounter (Signed)
Returned [facility]'s call and asked for Lawerance Bach but was told she is currently in a meeting at this time. Dusek took my name and number and I was told that I would get a return call later.

## 2021-07-01 ENCOUNTER — Encounter: Payer: PRIVATE HEALTH INSURANCE | Primary: Family

## 2021-07-03 NOTE — Anesthesia Post-Procedure Evaluation (Signed)
Anesthesia Postprocedure Evaluation by [facility], Karrie Doffing., MD at 07/03/21 1830                Author: Bridgette Habermann, Karrie Doffing., MD  Service: ANESTHESIOLOGY  Author Type: Physician       Filed: 07/03/21 1830  Date of Service: 07/03/21 1830  Status: Signed          Editor: Hope, Karrie Doffing., MD (Physician)                  Procedure(s):   LEFT HIP REVISION POSTERIOR APPROACH WITH ALLOGRAFT.      general      Anesthesia Post Evaluation         Multimodal analgesia: multimodal analgesia used between 6 hours prior to anesthesia start to PACU discharge   Patient location during evaluation: PACU  Patient participation: complete - patient participated  Level of consciousness: awake  Pain score: 0  Pain management: adequate  Airway patency: patent  Anesthetic complications: no  Cardiovascular  status: acceptable  Respiratory status: acceptable  Hydration status: acceptable  Post anesthesia nausea  and vomiting:  controlled  Final Post Anesthesia Temperature Assessment:  Normothermia (36.0-37.5 degrees C)         INITIAL Post-op Vital signs:        Vitals  Value  Taken Time         BP  129/77  05/26/21 1618     Temp  36.7 C (98 F)  05/26/21 1555     Pulse  90  05/26/21 1618     Resp  17  05/26/21 1618         SpO2  92 %  05/26/21 1618

## 2021-07-04 NOTE — Telephone Encounter (Signed)
Sam from [facility] needs to know rescrictions for patient home health. Weight bearing needed. Please call him at [phone].

## 2021-07-07 NOTE — Telephone Encounter (Signed)
Mukul (Physical Therapist) from [facility] called to get patients restrictions and office notes. Please call him at [phone] when available.

## 2021-07-11 NOTE — Telephone Encounter (Signed)
Returned phone call to [facility] with no answer. A message was left for a return call.

## 2021-07-13 ENCOUNTER — Inpatient Hospital Stay: Admit: 2021-07-13 | Payer: PRIVATE HEALTH INSURANCE | Primary: Family

## 2021-07-13 ENCOUNTER — Encounter

## 2021-07-13 DIAGNOSIS — S72002D Fracture of unspecified part of neck of left femur, subsequent encounter for closed fracture with routine healing: Secondary | ICD-10-CM

## 2021-07-13 NOTE — Telephone Encounter (Addendum)
Rob Hickman called in on behalf of [name]  stating that she need xray orders sent over to [facility] as soon as possible. She will call back at 9 to check again.

## 2021-07-13 NOTE — Telephone Encounter (Signed)
[name] put xray orders in,  I called patient made aware that they were in and she could have imaging done .

## 2021-07-15 ENCOUNTER — Encounter: Payer: PRIVATE HEALTH INSURANCE | Primary: Family

## 2021-07-15 ENCOUNTER — Ambulatory Visit: Admit: 2021-07-15 | Discharge: 2021-07-15 | Payer: MEDICARE | Primary: Family

## 2021-07-15 DIAGNOSIS — Z4789 Encounter for other orthopedic aftercare: Secondary | ICD-10-CM

## 2021-07-15 NOTE — Progress Notes (Signed)
Identified pt with two pt identifiers (name and DOB). Reviewed chart in preparation for visit and have obtained necessary documentation.    [name] is a 76 y.o. female  Chief Complaint   Patient presents with    Post-Op Check     Left hip 5/10 pain scale.      BP 96/60 (Site: Left Upper Arm, Position: Sitting)   Pulse 100   Temp 98.6 F (37 C) (Oral)   Resp 16   Ht 5\' 2"  (1.575 m)   SpO2 92%   BMI 20.85 kg/m     1. Have you been to the ER, urgent care clinic since your last visit?  Hospitalized since your last visit?Yes     2. Have you seen or consulted any other health care providers outside of the [facility] since your last visit?  Include any pap smears or colon screening. Yes PCP

## 2021-07-15 NOTE — Progress Notes (Signed)
Orthopedic Ambulatory Note           Patient Name:[name]     Date of Birth:12-02-1945     Age:76 y.o.      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 Gerda Diss  DOS: 05/26/2021    07/15/2021: Patient is 6 weeks status post left hip hemiarthroplasty revision after a periprosthetic fracture with a cementless stem.  She reports mild left anterior thigh pain.  She denies falling.  She has been nonweightbearing to the limb because she did not know if she could put weight on it.  Her initial home health physical therapy was not covered by insurance so she has not had physical therapy.    The patient denies any calf pain, shortness of breath, chest pain, fevers, or chills.        Physical Exam   AAOx3  Non-labored Respirations  Abdomen Non-tender    Left hip: Well-healed posterior incision.  She is neurovascular intact distally.    IMAGING   Radiographs were independently reviewed and interpreted.  Request radiographs of the left hip demonstrate well-placed left hip revision hemiarthroplasty with no evidence of new fracture.      ASSESSMENT/PLAN         77 year old female status post revision left hip hemiarthroplasty due to periprosthetic fracture.  The patient is weightbearing as tolerated.  Posterior hip precautions may be imposed liberally but I do not anticipate this patient dislocating given her solid range of motion while under anesthesia.    We will place new physical therapy consult for home health.  The patient is going to have difficulty getting to outpatient physical therapy and she is quite debilitated.  At this time I recommended home health.    Radiographs at Follow-Up: As needed  Weight Bearing Status: Weight-bear as tolerated  DVT Proph: Completed  Follow-Up: Approximately 8 weeks

## 2021-07-20 NOTE — Telephone Encounter (Signed)
Sam, with [facility] would like to confirm patients weightbearing status and post op restrictions. Please call when you can with this information. Phone: [phone]

## 2021-07-20 NOTE — Telephone Encounter (Signed)
Called and spoke with Sam, I read Dr.Grenier's last office note that states weight bearing as tolerated. Sam understood and states he will get her up and moving for PT.

## 2021-10-08 DIAGNOSIS — S20224A Contusion of middle back wall of thorax, initial encounter: Secondary | ICD-10-CM

## 2021-10-08 DIAGNOSIS — R072 Precordial pain: Secondary | ICD-10-CM

## 2021-10-08 NOTE — ED Provider Notes (Signed)
[facility] EMERGENCY DEPT  EMERGENCY DEPARTMENT HISTORY AND PHYSICAL EXAM      Date: 10/08/2021  Patient Name: [name]  MRN: [id number]  [place]: 1945/12/19  Date of evaluation: 10/08/2021  Provider: Earnestine Mealing, MD   Note Started: 1:40 AM EDT 10/09/21    HISTORY OF PRESENT ILLNESS     Chief Complaint   Patient presents with    Chest Pain       History Provided By: Patient    HPI: [name] is a 76 y.o. female presents to the emergency department for evaluation of chest pain.  Patient states earlier this evening she started feeling midsternal chest pain, felt sharp.  Nonradiating.  Associated shortness of breath.  Patient states episode was intermittent throughout the course of the evening, happened again at night.  Patient denies any recent fevers chills, no cough, no sore throat or runny nose.  Additionally patient was complaining of some thoracic back pain states she felt 2 days ago, landed on her back.  No numbness tingling in extremities, no hip or lower extremity injury.  Patient has no known cardiac history.    PAST MEDICAL HISTORY   Past Medical History:  Past Medical History:   Diagnosis Date    Chronic obstructive pulmonary disease (Meta)     Hypercholesteremia     Insomnia     Sleep disorder        Past Surgical History:  Past Surgical History:   Procedure Laterality Date    ORTHOPEDIC SURGERY  05/26/2021    LEFT HIP REVISION POSTERIOR APPROACH WITH ALLOGRAFT    PARTIAL HYSTERECTOMY (CERVIX NOT REMOVED)      SHOULDER SURGERY Left        Family History:  No family history on file.    Social History:  Social History     Tobacco Use    Smoking status: Former     Packs/day: 1     Types: Cigarettes     Quit date: 01/24/2019     Years since quitting: 2.7    Smokeless tobacco: Never   Substance Use Topics    Alcohol use: Yes     Alcohol/week: 1.0 standard drink of alcohol    Drug use: Never       Allergies:  Allergies   Allergen Reactions    Cheese Nausea And Vomiting     Severe vomiting       PCP: Rigoberto Noel,  APRN - NP    Current Meds:   No current facility-administered medications for this encounter.     Current Outpatient Medications   Medication Sig Dispense Refill    traMADol (ULTRAM) 50 MG tablet Take 1 tablet by mouth every 4 hours as needed for Pain for up to 3 days. Intended supply: 3 days. Take lowest dose possible to manage pain Max Daily Amount: 300 mg 15 tablet 0    atorvastatin (LIPITOR) 40 MG tablet Take 1 tablet by mouth daily      traZODone (DESYREL) 50 MG tablet Take 1 tablet by mouth at bedtime      rosuvastatin (CRESTOR) 10 MG tablet Take 1 tablet by mouth daily      melatonin 5 MG TABS tablet Take 1 tablet by mouth nightly as needed (sleep) 90 tablet 0    aspirin 81 MG EC tablet Take 1 tablet by mouth daily 30 tablet 3    metoprolol succinate (TOPROL XL) 25 MG extended release tablet Take 0.5 tablets by mouth daily 30 tablet 3  celecoxib (CELEBREX) 100 MG capsule Take 1 capsule by mouth 2 times daily 60 capsule 3    albuterol sulfate HFA (PROVENTIL;VENTOLIN;PROAIR) 108 (90 Base) MCG/ACT inhaler albuterol sulfate HFA 90 mcg/actuation aerosol inhaler   INHALE 2 PUFFS EVERY 6 HOURS AS NEEDED      fluticasone-umeclidin-vilant (TRELEGY ELLIPTA) 200-62.5-25 MCG/ACT AEPB inhaler Trelegy Ellipta 200 mcg-62.5 mcg-25 mcg powder for inhalation   INHALE 1 PUFF BY MOUTH ONCE A DAY      omeprazole (PRILOSEC) 20 MG delayed release capsule Take 1 capsule by mouth daily      polyethylene glycol (GLYCOLAX) 17 GM/SCOOP powder Take 17 g by mouth daily      senna-docusate (PERICOLACE) 8.6-50 MG per tablet Take 1 tablet by mouth 2 times daily         Social Determinants of Health:   Social Determinants of Health     Tobacco Use: Medium Risk (07/15/2021)    Patient History     Smoking Tobacco Use: Former     Smokeless Tobacco Use: Never     Passive Exposure: Not on file   Alcohol Use: Not At Risk (10/09/2021)    AUDIT-C     Frequency of Alcohol Consumption: Never     Average Number of Drinks: Patient does not drink      Frequency of Binge Drinking: Never   Financial Resource Strain: Not on file   Food Insecurity: Not on file   Transportation Needs: Not on file   Physical Activity: Not on file   Stress: Not on file   Social Connections: Not on file   Intimate Partner Violence: Not on file   Depression: Not at risk (07/15/2021)    PHQ-2     PHQ-2 Score: 0   Housing Stability: Not on file       PHYSICAL EXAM   Physical Exam  Vitals and nursing note reviewed.   Constitutional:       General: She is not in acute distress.     Appearance: Normal appearance.      Comments: Elderly, frail-appearing   HENT:      Head: Normocephalic and atraumatic.      Nose: Nose normal.   Eyes:      Extraocular Movements: Extraocular movements intact.   Cardiovascular:      Rate and Rhythm: Normal rate.      Heart sounds: Normal heart sounds.   Pulmonary:      Effort: Pulmonary effort is normal. No respiratory distress.      Breath sounds: Normal breath sounds.   Abdominal:      General: Abdomen is flat. Bowel sounds are normal. There is no distension.      Tenderness: There is no abdominal tenderness.   Musculoskeletal:         General: No swelling or deformity. Normal range of motion.      Cervical back: Normal range of motion and neck supple. No tenderness.        Back:    Skin:     General: Skin is warm.   Neurological:      General: No focal deficit present.      Mental Status: She is alert and oriented to person, place, and time.      Motor: No weakness.      Gait: Gait normal.           SCREENINGS   History: 0  ECG: 0  Patient Age: 87  *Risk factors for Atherosclerotic disease: Hypertension  Risk  Factors: 1  Troponin: 0  Heart Score Total: 3                LAB, EKG AND DIAGNOSTIC RESULTS   Labs:  Recent Results (from the past 12 hour(s))   CBC with Auto Differential    Collection Time: 10/09/21 12:17 AM   Result Value Ref Range    WBC 12.3 (H) 3.6 - 11.0 K/uL    RBC 4.34 3.80 - 5.20 M/uL    Hemoglobin 12.7 11.5 - 16.0 g/dL    Hematocrit 38.9 35.0 -  47.0 %    MCV 89.6 80.0 - 99.0 FL    MCH 29.3 26.0 - 34.0 PG    MCHC 32.6 30.0 - 36.5 g/dL    RDW 15.6 (H) 11.5 - 14.5 %    Platelets 346 150 - 400 K/uL    MPV 8.4 (L) 8.9 - 12.9 FL    Nucleated RBCs 0.0 0.0 PER 100 WBC    nRBC 0.00 0.00 - 0.01 K/uL    Neutrophils % 74 32 - 75 %    Lymphocytes % 18 12 - 49 %    Monocytes % 6 5 - 13 %    Eosinophils % 2 0 - 7 %    Basophils % 0 0 - 1 %    Immature Granulocytes 0 0 - 0.5 %    Neutrophils Absolute 9.0 (H) 1.8 - 8.0 K/UL    Lymphocytes Absolute 2.2 0.8 - 3.5 K/UL    Monocytes Absolute 0.8 0.0 - 1.0 K/UL    Eosinophils Absolute 0.2 0.0 - 0.4 K/UL    Basophils Absolute 0.1 0.0 - 0.1 K/UL    Absolute Immature Granulocyte 0.1 (H) 0.00 - 0.04 K/UL    Differential Type AUTOMATED     Comprehensive Metabolic Panel    Collection Time: 10/09/21 12:17 AM   Result Value Ref Range    Sodium 136 136 - 145 mmol/L    Potassium 4.0 3.5 - 5.1 mmol/L    Chloride 105 97 - 108 mmol/L    CO2 24 21 - 32 mmol/L    Anion Gap 7 5 - 15 mmol/L    Glucose 118 (H) 65 - 100 mg/dL    BUN 12 6 - 20 mg/dL    Creatinine 1.10 (H) 0.55 - 1.02 mg/dL    Bun/Cre Ratio 11 (L) 12 - 20      Est, Glom Filt Rate 52 (L) >60 ml/min/1.34m    Calcium 9.0 8.5 - 10.1 mg/dL    Total Bilirubin 0.6 0.2 - 1.0 mg/dL    AST 17 15 - 37 U/L    ALT 16 12 - 78 U/L    Alk Phosphatase 111 45 - 117 U/L    Total Protein 6.7 6.4 - 8.2 g/dL    Albumin 3.5 3.5 - 5.0 g/dL    Globulin 3.2 2.0 - 4.0 g/dL    Albumin/Globulin Ratio 1.1 1.1 - 2.2     Troponin    Collection Time: 10/09/21 12:17 AM   Result Value Ref Range    Troponin, High Sensitivity 9 0 - 51 ng/L   Troponin    Collection Time: 10/09/21  1:57 AM   Result Value Ref Range    Troponin, High Sensitivity 8 0 - 51 ng/L       EKG: Normal sinus rhythm 99, no ST elevations, normal axis, normal intervals    Radiologic Studies:  Non-plain film images such as CT, Ultrasound and MRI are read  by the radiologist. Plain radiographic images are visualized and preliminarily interpreted by the  ED Physician with the following findings: Xray Interpreted by me.  Shows hyperinflated lungs noted, no obvious infiltrates or effusions, no pneumothorax    Interpretation per the Radiologist below, if available at the time of this note:  CT THORACIC SPINE WO CONTRAST   Final Result   Partial visualization of superior endplate L2 fracture. No acute   thoracic spine fracture. Chronic T5, T7, and T10 compression fracture   deformities.         XR CHEST PORTABLE   Final Result   Emphysema. No acute findings.           ED COURSE and DIFFERENTIAL DIAGNOSIS/MDM   CC/HPI Summary, DDx, ED Course, and Reassessment: 76 year old female history of COPD, hypertension, presents emergency department for episodes of chest pain intermittent today.    Physical shows elderly female, frail appearing, however in no distress, stable vitals.    No chest wall tenderness palpation patient is complaining of some mild T3-T4 spinal process tenderness, states she fell yesterday.  No signs of any focal neurological deficits no extremity trauma noted.    Differential diagnosis includes ACS, NSTEMI, pneumothorax, pleural effusion.    We will obtain basic lab work, cardiac enzymes, continue to monitor patient.    Records Reviewed (source and summary of external notes): Prior medical records and Nursing notes    Vitals:    Vitals:    10/09/21 0330 10/09/21 0415 10/09/21 0515 10/09/21 0600   BP:   (!) 140/82 127/84   Pulse: 90 93 80 85   Resp: 20 20     Temp:       TempSrc:       SpO2:   94% 95%   Weight:       Height:            ED COURSE  ED Course as of 10/09/21 0802   Sun Oct 09, 2021   0433 Patient's CT thoracic spine resulted by radiologist, partially visualized L2 endplate fracture however patient is not tender in the lumbar spine region. [PZ]   0543 Patient's troponin 8, was 9 several hours ago.  Patient remains chest pain-free.  At this time I have a low suspicion for cardiac cause of pain was likely chest pain resulted from chest wall strain  and/or pleurisy.  I discussed results with patient, I discussed back CT as well, will discharge patient home with prescription for tramadol, patient has an appointment with her PCP in 3 days, instructed to follow-up as appointed, return to emergency department for any worsening chest pain, shortness of breath.  Patient minimal with plan. [PZ]      ED Course User Index  [PZ] Sanda Klein, MD       Disposition Considerations (Tests not done, Shared Decision Making, Pt Expectation of Test or Treatment.): Not Applicable    Patient was given the following medications:  Medications - No data to display    CONSULTS: (Who and What was discussed)  None     Social Determinants affecting Dx or Tx: None    Smoking Cessation: Not Applicable    PROCEDURES   Unless otherwise noted above, none.  Procedures      CRITICAL CARE TIME   Patient does not meet Critical Care Time, 0 minutes    FINAL IMPRESSION     1. Precordial pain    2. Contusion of thoracic spine  DISPOSITION/PLAN   DISPOSITION Decision To Discharge 10/09/2021 05:44:47 AM    Discharge Note: The patient is stable for discharge home. The signs, symptoms, diagnosis, and discharge instructions have been discussed, understanding conveyed, and agreed upon. The patient is to follow up as recommended or return to ER should their symptoms worsen.      PATIENT REFERRED TO:  Rigoberto Noel, APRN - NP  [address]  [place] [address]  [phone]    On 10/12/2021  As needed    [facility] EMERGENCY DEPT  [place]  [phone]    If symptoms worsen        DISCHARGE MEDICATIONS:     Medication List        START taking these medications      traMADol 50 MG tablet  Commonly known as: Ultram  Take 1 tablet by mouth every 4 hours as needed for Pain for up to 3 days. Intended supply: 3 days. Take lowest dose possible to manage pain Max Daily Amount: 300 mg            ASK your doctor about these medications      albuterol  sulfate HFA 108 (90 Base) MCG/ACT inhaler  Commonly known as: PROVENTIL;VENTOLIN;PROAIR     aspirin 81 MG EC tablet  Take 1 tablet by mouth daily     atorvastatin 40 MG tablet  Commonly known as: LIPITOR     celecoxib 100 MG capsule  Commonly known as: CELEBREX  Take 1 capsule by mouth 2 times daily     melatonin 5 MG Tabs tablet  Take 1 tablet by mouth nightly as needed (sleep)     metoprolol succinate 25 MG extended release tablet  Commonly known as: TOPROL XL  Take 0.5 tablets by mouth daily     omeprazole 20 MG delayed release capsule  Commonly known as: PRILOSEC     polyethylene glycol 17 GM/SCOOP powder  Commonly known as: GLYCOLAX     rosuvastatin 10 MG tablet  Commonly known as: CRESTOR     senna-docusate 8.6-50 MG per tablet  Commonly known as: PERICOLACE     traZODone 50 MG tablet  Commonly known as: DESYREL     Trelegy Ellipta 200-62.5-25 MCG/ACT Aepb inhaler  Generic drug: fluticasone-umeclidin-vilant               Where to Get Your Medications        These medications were sent to [id number] [place] - [phone]- [phone] [address] [id number]     Phone: [phone]  traMADol 50 MG tablet           DISCONTINUED MEDICATIONS:  Discharge Medication List as of 10/09/2021  6:14 AM          I am the Primary Clinician of Record: PEarnestine Mealing MD (electronically signed)    (Please note that parts of this dictation were completed with voice recognition software. Quite often unanticipated grammatical, syntax, homophones, and other interpretive errors are inadvertently transcribed by the computer software. Please disregards these errors. Please excuse any errors that have escaped final proofreading.)     PSanda Klein MD  10/09/21 [phone]

## 2021-10-08 NOTE — ED Triage Notes (Addendum)
Pt arrives via ems for chest pain that's non radiating that started about an hour ago. Patient denies cardiac hx. Pt states the pain is surrounding her left breast. Pt sounds very congested during triage and coughing

## 2021-10-09 ENCOUNTER — Emergency Department: Admit: 2021-10-09 | Payer: MEDICARE | Primary: Family

## 2021-10-09 ENCOUNTER — Inpatient Hospital Stay
Admit: 2021-10-09 | Discharge: 2021-10-09 | Disposition: A | Discharge status: Home / Self Care | Payer: PRIVATE HEALTH INSURANCE | Attending: Emergency Medicine

## 2021-10-09 LAB — CBC WITH AUTO DIFFERENTIAL
Absolute Immature Granulocyte: 0.1 10*3/uL — ABNORMAL HIGH (ref 0.00–0.04)
Basophils %: 0 % (ref 0–1)
Basophils Absolute: 0.1 10*3/uL (ref 0.0–0.1)
Eosinophils %: 2 % (ref 0–7)
Eosinophils Absolute: 0.2 10*3/uL (ref 0.0–0.4)
Hematocrit: 38.9 % (ref 35.0–47.0)
Hemoglobin: 12.7 g/dL (ref 11.5–16.0)
Immature Granulocytes: 0 % (ref 0–0.5)
Lymphocytes %: 18 % (ref 12–49)
Lymphocytes Absolute: 2.2 10*3/uL (ref 0.8–3.5)
MCH: 29.3 PG (ref 26.0–34.0)
MCHC: 32.6 g/dL (ref 30.0–36.5)
MCV: 89.6 FL (ref 80.0–99.0)
MPV: 8.4 FL — ABNORMAL LOW (ref 8.9–12.9)
Monocytes %: 6 % (ref 5–13)
Monocytes Absolute: 0.8 10*3/uL (ref 0.0–1.0)
Neutrophils %: 74 % (ref 32–75)
Neutrophils Absolute: 9 10*3/uL — ABNORMAL HIGH (ref 1.8–8.0)
Nucleated RBCs: 0 PER 100 WBC
Platelets: 346 10*3/uL (ref 150–400)
RBC: 4.34 M/uL (ref 3.80–5.20)
RDW: 15.6 % — ABNORMAL HIGH (ref 11.5–14.5)
WBC: 12.3 10*3/uL — ABNORMAL HIGH (ref 3.6–11.0)
nRBC: 0 10*3/uL (ref 0.00–0.01)

## 2021-10-09 LAB — TROPONIN
Troponin, High Sensitivity: 9 ng/L (ref 0–51)
Troponin, High Sensitivity: 8 ng/L (ref 0–51)

## 2021-10-09 LAB — COMPREHENSIVE METABOLIC PANEL
ALT: 16 U/L (ref 12–78)
AST: 17 U/L (ref 15–37)
Albumin/Globulin Ratio: 1.1 (ref 1.1–2.2)
Albumin: 3.5 g/dL (ref 3.5–5.0)
Alk Phosphatase: 111 U/L (ref 45–117)
Anion Gap: 7 mmol/L (ref 5–15)
BUN: 12 mg/dL (ref 6–20)
Bun/Cre Ratio: 11 — ABNORMAL LOW (ref 12–20)
CO2: 24 mmol/L (ref 21–32)
Calcium: 9 mg/dL (ref 8.5–10.1)
Chloride: 105 mmol/L (ref 97–108)
Creatinine: 1.1 mg/dL — ABNORMAL HIGH (ref 0.55–1.02)
Est, Glom Filt Rate: 52 mL/min/{1.73_m2} — ABNORMAL LOW (ref >60)
Globulin: 3.2 g/dL (ref 2.0–4.0)
Glucose: 118 mg/dL — ABNORMAL HIGH (ref 65–100)
Potassium: 4 mmol/L (ref 3.5–5.1)
Sodium: 136 mmol/L (ref 136–145)
Total Bilirubin: 0.6 mg/dL (ref 0.2–1.0)
Total Protein: 6.7 g/dL (ref 6.4–8.2)

## 2021-10-09 MED ORDER — TRAMADOL HCL 50 MG PO TABS
50 MG | ORAL_TABLET | ORAL | 0 refills | Status: AC | PRN
Start: 2021-10-09 — End: 2021-10-12

## 2021-10-18 ENCOUNTER — Emergency Department: Admit: 2021-10-18 | Payer: MEDICARE | Primary: Family

## 2021-10-18 ENCOUNTER — Inpatient Hospital Stay
Admission: EM | Admit: 2021-10-18 | Discharge: 2021-10-21 | Disposition: A | Discharge status: Skilled Nursing Facility | Payer: MEDICARE | Admitting: Internal Medicine

## 2021-10-18 DIAGNOSIS — A09 Infectious gastroenteritis and colitis, unspecified: Secondary | ICD-10-CM

## 2021-10-18 DIAGNOSIS — A419 Sepsis, unspecified organism: Secondary | ICD-10-CM

## 2021-10-18 LAB — CBC WITH AUTO DIFFERENTIAL
Absolute Immature Granulocyte: 0.1 10*3/uL — ABNORMAL HIGH (ref 0.00–0.04)
Basophils %: 0 % (ref 0–1)
Basophils Absolute: 0.1 10*3/uL (ref 0.0–0.1)
Eosinophils %: 0 % (ref 0–7)
Eosinophils Absolute: 0 10*3/uL (ref 0.0–0.4)
Hematocrit: 43.7 % (ref 35.0–47.0)
Hemoglobin: 13.9 g/dL (ref 11.5–16.0)
Immature Granulocytes: 1 % — ABNORMAL HIGH (ref 0–0.5)
Lymphocytes %: 5 % — ABNORMAL LOW (ref 12–49)
Lymphocytes Absolute: 1.1 10*3/uL (ref 0.8–3.5)
MCH: 28.9 PG (ref 26.0–34.0)
MCHC: 31.8 g/dL (ref 30.0–36.5)
MCV: 90.9 FL (ref 80.0–99.0)
MPV: 8.7 FL — ABNORMAL LOW (ref 8.9–12.9)
Monocytes %: 4 % — ABNORMAL LOW (ref 5–13)
Monocytes Absolute: 0.9 10*3/uL (ref 0.0–1.0)
Neutrophils %: 90 % — ABNORMAL HIGH (ref 32–75)
Neutrophils Absolute: 19.4 10*3/uL — ABNORMAL HIGH (ref 1.8–8.0)
Nucleated RBCs: 0 PER 100 WBC
Platelets: 524 10*3/uL — ABNORMAL HIGH (ref 150–400)
RBC: 4.81 M/uL (ref 3.80–5.20)
RDW: 15.9 % — ABNORMAL HIGH (ref 11.5–14.5)
WBC: 21.7 10*3/uL — ABNORMAL HIGH (ref 3.6–11.0)
nRBC: 0 10*3/uL (ref 0.00–0.01)

## 2021-10-18 LAB — URINALYSIS WITH REFLEX TO CULTURE
Bilirubin Urine: NEGATIVE
Glucose, UA: NEGATIVE mg/dL
Ketones, Urine: 5 mg/dL — AB
Nitrite, Urine: NEGATIVE
Protein, UA: 30 mg/dL — AB
Specific Gravity, UA: >1.03 — ABNORMAL HIGH (ref 1.003–1.030)
Urobilinogen, Urine: 4 EU/dL — ABNORMAL HIGH (ref 0.1–1.0)
pH, Urine: 5 (ref 5.0–8.0)

## 2021-10-18 LAB — POC BLOOD GAS AND CHEMISTRY
BASE DEFICIT (POC): 0.9 mmol/L
HCO3, Mixed: 25.1 mmol/L (ref 19.0–28.0)
POC Chloride: 108 MMOL/L — ABNORMAL HIGH (ref 98–107)
POC Creatinine: 1.47 MG/DL — ABNORMAL HIGH (ref 0.6–1.3)
POC Glucose: 203 mg/dL — ABNORMAL HIGH (ref 65–100)
POC Ionized Calcium: 1.17 mmol/L (ref 1.12–1.32)
POC Lactic Acid: 4.05 mmol/L (ref 0.40–2.00)
POC Potassium: 4.6 mmol/L (ref 3.5–5.5)
POC Sodium: 145 mmol/L (ref 136–145)
POC TCO2: 25 MMOL/L
eGFR, POC: 37 mL/min/{1.73_m2} — ABNORMAL LOW (ref >60)
pCO2, Arterial, POC: 44.9 mmHg (ref 35.0–45.0)
pH, Arterial, POC: 7.36 (ref 7.35–7.45)
pO2, Arterial, POC: <27 mmHg — CL (ref 75–100)

## 2021-10-18 LAB — COMPREHENSIVE METABOLIC PANEL
ALT: 16 U/L (ref 12–78)
Albumin/Globulin Ratio: 1 — ABNORMAL LOW (ref 1.1–2.2)
Albumin: 3.9 g/dL (ref 3.5–5.0)
Alk Phosphatase: 138 U/L — ABNORMAL HIGH (ref 45–117)
Anion Gap: 11 mmol/L (ref 5–15)
BUN: 42 mg/dL — ABNORMAL HIGH (ref 6–20)
Bun/Cre Ratio: 23 — ABNORMAL HIGH (ref 12–20)
CO2: 24 mmol/L (ref 21–32)
Calcium: 9.3 mg/dL (ref 8.5–10.1)
Chloride: 109 mmol/L — ABNORMAL HIGH (ref 97–108)
Creatinine: 1.79 mg/dL — ABNORMAL HIGH (ref 0.55–1.02)
Est, Glom Filt Rate: 29 mL/min/{1.73_m2} — ABNORMAL LOW (ref >60)
Globulin: 3.8 g/dL (ref 2.0–4.0)
Glucose: 189 mg/dL — ABNORMAL HIGH (ref 65–100)
Sodium: 144 mmol/L (ref 136–145)
Total Bilirubin: 0.9 mg/dL (ref 0.2–1.0)
Total Protein: 7.7 g/dL (ref 6.4–8.2)

## 2021-10-18 LAB — POC LACTIC ACID: POC Lactic Acid: 2.64 mmol/L (ref 0.40–2.00)

## 2021-10-18 LAB — C-REACTIVE PROTEIN: CRP: 7.96 mg/dL — ABNORMAL HIGH (ref 0.00–0.60)

## 2021-10-18 MED ORDER — ONDANSETRON 4 MG PO TBDP
4 MG | Freq: Three times a day (TID) | ORAL | Status: DC | PRN
Start: 2021-10-18 — End: 2021-10-21

## 2021-10-18 MED ORDER — PANTOPRAZOLE SODIUM 40 MG PO TBEC
40 MG | Freq: Every day | ORAL | Status: DC
Start: 2021-10-18 — End: 2021-10-21
  Administered 2021-10-19 – 2021-10-21 (×3): 40 mg via ORAL

## 2021-10-18 MED ORDER — ASPIRIN 81 MG PO TBEC
81 MG | Freq: Every day | ORAL | Status: DC
Start: 2021-10-18 — End: 2021-10-21
  Administered 2021-10-19 – 2021-10-21 (×3): 81 mg via ORAL

## 2021-10-18 MED ORDER — MELATONIN 5 MG PO TABS
5 MG | Freq: Every evening | ORAL | Status: DC | PRN
Start: 2021-10-18 — End: 2021-10-18

## 2021-10-18 MED ORDER — ALBUTEROL SULFATE HFA 108 (90 BASE) MCG/ACT IN AERS
108 (90 Base) MCG/ACT | RESPIRATORY_TRACT | Status: DC | PRN
Start: 2021-10-18 — End: 2021-10-21

## 2021-10-18 MED ORDER — METOPROLOL SUCCINATE ER 25 MG PO TB24
25 MG | Freq: Two times a day (BID) | ORAL | Status: DC
Start: 2021-10-18 — End: 2021-10-21
  Administered 2021-10-19 – 2021-10-21 (×6): 12.5 mg via ORAL

## 2021-10-18 MED ORDER — SODIUM CHLORIDE 0.9 % IV SOLN (MINI-BAG)
0.9 % | Freq: Two times a day (BID) | INTRAVENOUS | Status: DC
Start: 2021-10-18 — End: 2021-10-21
  Administered 2021-10-19 – 2021-10-21 (×6): 3375 mg via INTRAVENOUS

## 2021-10-18 MED ORDER — BUDESONIDE-FORMOTEROL FUMARATE 160-4.5 MCG/ACT IN AERO
Freq: Two times a day (BID) | RESPIRATORY_TRACT | Status: DC
Start: 2021-10-18 — End: 2021-10-21
  Administered 2021-10-19 – 2021-10-21 (×3): 2 via RESPIRATORY_TRACT

## 2021-10-18 MED ORDER — ATORVASTATIN CALCIUM 40 MG PO TABS
40 MG | Freq: Every day | ORAL | Status: AC
Start: 2021-10-18 — End: 2021-10-21
  Administered 2021-10-19 – 2021-10-21 (×3): 40 mg via ORAL

## 2021-10-18 MED ORDER — IOPAMIDOL 76 % IV SOLN
76 % | Freq: Once | INTRAVENOUS | Status: AC | PRN
Start: 2021-10-18 — End: 2021-10-18
  Administered 2021-10-18: 17:00:00 100 mL via INTRAVENOUS

## 2021-10-18 MED ORDER — SODIUM CHLORIDE 0.9 % IV SOLN
0.9 % | INTRAVENOUS | Status: DC | PRN
Start: 2021-10-18 — End: 2021-10-21

## 2021-10-18 MED ORDER — ACETAMINOPHEN 325 MG PO TABS
325 | Freq: Four times a day (QID) | ORAL | Status: DC | PRN
Start: 2021-10-18 — End: 2021-10-21
  Administered 2021-10-20: 19:00:00 650 mg via ORAL

## 2021-10-18 MED ORDER — NORMAL SALINE FLUSH 0.9 % IV SOLN
0.9 % | INTRAVENOUS | Status: DC | PRN
Start: 2021-10-18 — End: 2021-10-21

## 2021-10-18 MED ORDER — PIPERACILLIN SOD-TAZOBACTAM SO 4.5 (4-0.5) G IV SOLR
4.5 (4-0.5) g | INTRAVENOUS | Status: AC
Start: 2021-10-18 — End: 2021-10-18
  Administered 2021-10-18: 19:00:00 4500 mg via INTRAVENOUS

## 2021-10-18 MED ORDER — SODIUM CHLORIDE 0.9 % IV SOLN
0.9 % | INTRAVENOUS | Status: DC
Start: 2021-10-18 — End: 2021-10-21
  Administered 2021-10-18 – 2021-10-20 (×3): via INTRAVENOUS

## 2021-10-18 MED ORDER — HYDRALAZINE HCL 20 MG/ML IJ SOLN
20 MG/ML | INTRAMUSCULAR | Status: DC | PRN
Start: 2021-10-18 — End: 2021-10-21

## 2021-10-18 MED ORDER — HEPARIN SODIUM (PORCINE) 5000 UNIT/ML IJ SOLN
5000 UNIT/ML | Freq: Two times a day (BID) | INTRAMUSCULAR | Status: DC
Start: 2021-10-18 — End: 2021-10-21
  Administered 2021-10-18 – 2021-10-21 (×7): 5000 [IU] via SUBCUTANEOUS

## 2021-10-18 MED ORDER — ACETAMINOPHEN 650 MG RE SUPP
650 MG | Freq: Four times a day (QID) | RECTAL | Status: DC | PRN
Start: 2021-10-18 — End: 2021-10-21

## 2021-10-18 MED ORDER — FLUTICASONE-UMECLIDIN-VILANT 200-62.5-25 MCG/ACT IN AEPB
Freq: Every day | RESPIRATORY_TRACT | Status: DC
Start: 2021-10-18 — End: 2021-10-18

## 2021-10-18 MED ORDER — ONDANSETRON HCL 4 MG/2ML IJ SOLN
42 MG/2ML | Freq: Four times a day (QID) | INTRAMUSCULAR | Status: DC | PRN
Start: 2021-10-18 — End: 2021-10-21

## 2021-10-18 MED ORDER — LACTATED RINGERS IV BOLUS
Freq: Once | INTRAVENOUS | Status: AC
Start: 2021-10-18 — End: 2021-10-18
  Administered 2021-10-18: 18:00:00 1000 mL via INTRAVENOUS

## 2021-10-18 MED ORDER — POLYETHYLENE GLYCOL 3350 17 G PO PACK
17 g | Freq: Every day | ORAL | Status: AC | PRN
Start: 2021-10-18 — End: 2021-10-21

## 2021-10-18 MED ORDER — TIOTROPIUM BROMIDE MONOHYDRATE 2.5 MCG/ACT IN AERS
2.5 MCG/ACT | Freq: Every day | RESPIRATORY_TRACT | Status: DC
Start: 2021-10-18 — End: 2021-10-21
  Administered 2021-10-21: 12:00:00 2 via RESPIRATORY_TRACT

## 2021-10-18 MED ORDER — CLONIDINE HCL 0.1 MG PO TABS
0.1 MG | Freq: Three times a day (TID) | ORAL | Status: DC | PRN
Start: 2021-10-18 — End: 2021-10-21

## 2021-10-18 MED ORDER — TRAZODONE HCL 50 MG PO TABS
50 MG | Freq: Every evening | ORAL | Status: DC
Start: 2021-10-18 — End: 2021-10-18

## 2021-10-18 MED ORDER — NORMAL SALINE FLUSH 0.9 % IV SOLN
0.9 % | Freq: Two times a day (BID) | INTRAVENOUS | Status: DC
Start: 2021-10-18 — End: 2021-10-21
  Administered 2021-10-19: 01:00:00 10 mL via INTRAVENOUS

## 2021-10-18 MED FILL — ISOVUE-370 76 % IV SOLN: 76 % | INTRAVENOUS | Qty: 100

## 2021-10-18 MED FILL — SODIUM CHLORIDE FLUSH 0.9 % IV SOLN: 0.9 % | INTRAVENOUS | Qty: 40

## 2021-10-18 MED FILL — LACTATED RINGERS IV SOLN: INTRAVENOUS | Qty: 1000

## 2021-10-18 MED FILL — SODIUM CHLORIDE 0.9 % IV SOLN: 0.9 % | INTRAVENOUS | Qty: 1000

## 2021-10-18 MED FILL — PIPERACILLIN SOD-TAZOBACTAM SO 4.5 (4-0.5) G IV SOLR: 4.5 (4-0.5) g | INTRAVENOUS | Qty: 4500

## 2021-10-18 MED FILL — HEPARIN SODIUM (PORCINE) 5000 UNIT/ML IJ SOLN: 5000 UNIT/ML | INTRAMUSCULAR | Qty: 1

## 2021-10-18 NOTE — Consults (Signed)
Gastroenterology Consult     Referring Physician: Rigoberto Noel, APRN - NP     Consult Date: 10/18/2021     Subjective:     Patient is admitted to the hospital with profuse diarrhea associated with high white cell count and thickening of the left colon on CAT scan.  She has multiple liver problems as listed above at least she does not give any history of antibiotic usage    Past Medical History:   Diagnosis Date    Chronic obstructive pulmonary disease ([place])     Hypercholesteremia     Insomnia     Sleep disorder      Past Surgical History:   Procedure Laterality Date    ORTHOPEDIC SURGERY  05/26/2021    LEFT HIP REVISION POSTERIOR APPROACH WITH ALLOGRAFT    PARTIAL HYSTERECTOMY (CERVIX NOT REMOVED)      SHOULDER SURGERY Left       No family history on file.  Social History     Tobacco Use    Smoking status: Former     Packs/day: 1     Types: Cigarettes     Quit date: 01/24/2019     Years since quitting: 2.7    Smokeless tobacco: Never   Substance Use Topics    Alcohol use: Yes     Alcohol/week: 1.0 standard drink of alcohol      Allergies   Allergen Reactions    Cheese Nausea And Vomiting     Severe vomiting     Current Facility-Administered Medications   Medication Dose Route Frequency    piperacillin-tazobactam (ZOSYN) 3,375 mg in sodium chloride 0.9 % 50 mL IVPB (mini-bag)  3,375 mg IntraVENous Q12H    sodium chloride flush 0.9 % injection 5-40 mL  5-40 mL IntraVENous 2 times per day    sodium chloride flush 0.9 % injection 5-40 mL  5-40 mL IntraVENous PRN    0.9 % sodium chloride infusion   IntraVENous PRN    heparin (porcine) injection 5,000 Units  5,000 Units SubCUTAneous BID    ondansetron (ZOFRAN-ODT) disintegrating tablet 4 mg  4 mg Oral Q8H PRN    Or    ondansetron (ZOFRAN) injection 4 mg  4 mg IntraVENous Q6H PRN    polyethylene glycol (GLYCOLAX) packet 17 g  17 g Oral Daily PRN    acetaminophen (TYLENOL) tablet 650 mg  650 mg Oral Q6H PRN    Or    acetaminophen (TYLENOL) suppository 650 mg  650 mg Rectal Q6H  PRN    0.9 % sodium chloride infusion   IntraVENous Continuous    albuterol sulfate HFA (PROVENTIL;VENTOLIN;PROAIR) 108 (90 Base) MCG/ACT inhaler 2 puff  2 puff Inhalation Q4H PRN    aspirin EC tablet 81 mg  81 mg Oral Daily    atorvastatin (LIPITOR) tablet 40 mg  40 mg Oral Daily    fluticasone-umeclidin-vilant (TRELEGY ELLIPTA) 200-62.5-25 MCG/ACT inhaler 1 puff  1 puff Inhalation Daily    metoprolol succinate (TOPROL XL) extended release tablet 12.5 mg  12.5 mg Oral BID    [START ON 10/19/2021] pantoprazole (PROTONIX) tablet 40 mg  40 mg Oral QAM AC    traZODone (DESYREL) tablet 50 mg  50 mg Oral Nightly    hydrALAZINE (APRESOLINE) injection 10 mg  10 mg IntraVENous Q4H PRN    cloNIDine (CATAPRES) tablet 0.1 mg  0.1 mg Oral Q8H PRN     Current Outpatient Medications   Medication Sig    traMADol (ULTRAM) 50 MG tablet  Take 1 tablet by mouth every 6 hours as needed for Pain. Max Daily Amount: 200 mg    atorvastatin (LIPITOR) 40 MG tablet Take 1 tablet by mouth daily    traZODone (DESYREL) 50 MG tablet Take 1 tablet by mouth at bedtime (Patient not taking: Reported on 10/18/2021)    rosuvastatin (CRESTOR) 10 MG tablet Take 1 tablet by mouth daily (Patient not taking: Reported on 10/18/2021)    melatonin 5 MG TABS tablet Take 1 tablet by mouth nightly as needed (sleep) (Patient not taking: Reported on 10/18/2021)    aspirin 81 MG EC tablet Take 1 tablet by mouth daily (Patient not taking: Reported on 10/18/2021)    metoprolol succinate (TOPROL XL) 25 MG extended release tablet Take 0.5 tablets by mouth daily    celecoxib (CELEBREX) 100 MG capsule Take 1 capsule by mouth 2 times daily (Patient not taking: Reported on 10/18/2021)    albuterol sulfate HFA (PROVENTIL;VENTOLIN;PROAIR) 108 (90 Base) MCG/ACT inhaler albuterol sulfate HFA 90 mcg/actuation aerosol inhaler   INHALE 2 PUFFS EVERY 6 HOURS AS NEEDED (Patient not taking: Reported on 10/18/2021)    fluticasone-umeclidin-vilant (TRELEGY ELLIPTA) 200-62.5-25 MCG/ACT AEPB  inhaler Trelegy Ellipta 200 mcg-62.5 mcg-25 mcg powder for inhalation   INHALE 1 PUFF BY MOUTH ONCE A DAY (Patient not taking: Reported on 10/18/2021)    omeprazole (PRILOSEC) 20 MG delayed release capsule Take 1 capsule by mouth daily    polyethylene glycol (GLYCOLAX) 17 GM/SCOOP powder Take 17 g by mouth daily (Patient not taking: Reported on 10/18/2021)    senna-docusate (PERICOLACE) 8.6-50 MG per tablet Take 1 tablet by mouth 2 times daily (Patient not taking: Reported on 10/18/2021)        Review of Systems:  A detailed 10 organ review of systems is obtained with pertinent positives as listed in the History of Present Illness and Past Medical History. All others are negative.    Objective:     Physical Exam:  BP (!) 156/103   Pulse 100   Temp 98.7 F (37.1 C) (Oral)   Resp 20   Ht 1.6 m (5' 3" )   Wt 41.7 kg (92 lb)   SpO2 94%   BMI 16.30 kg/m      Skin:  Extremities and face reveal no rashes. No palmer erythema. No telangiectasias on the chest wall.  HEENT: Sclerae anicteric. Extra-occular muscles are intact. No oral ulcers.  No abnormal pigmentation of the lips. The neck is supple.  Cardiovascular: Regular rate and rhythm. No murmurs, gallops, or rubs. PMI nondisplaced. Carotids without bruits.  Respiratory:  Comfortable breathing with no accessory muscle use. Clear breath sounds with no wheezes, rales, or rhonchi.  GI:  Abdomen nondistended, soft, and nontender.  Normal active bowel sounds. No enlargement of the liver or spleen. No masses palpable.  Rectal:  Deferred  Musculoskeletal:  No pitting edema of the lower legs. Extremities have good range of motion.  No costovertebral tenderness.  Neurological:  Gross memory appears intact.  Patient is alert and oriented.  Psychiatric:  Mood appears appropriate with judgement intact.  Lymphatic:  No cervical or supraclavicular adenopathy.    Lab/Data Review:  Recent Results (from the past 24 hour(s))   Culture, Blood 1    Collection Time: 10/18/21 11:22 AM     Specimen: Blood   Result Value Ref Range    Special Requests No Special Requests      Culture No growth after 5 hours     Blood Culture 2  Collection Time: 10/18/21 11:22 AM    Specimen: Blood   Result Value Ref Range    Special Requests No Special Requests      Culture No growth after 5 hours     CBC with Auto Differential    Collection Time: 10/18/21 11:22 AM   Result Value Ref Range    WBC 21.7 (H) 3.6 - 11.0 K/uL    RBC 4.81 3.80 - 5.20 M/uL    Hemoglobin 13.9 11.5 - 16.0 g/dL    Hematocrit 43.7 35.0 - 47.0 %    MCV 90.9 80.0 - 99.0 FL    MCH 28.9 26.0 - 34.0 PG    MCHC 31.8 30.0 - 36.5 g/dL    RDW 15.9 (H) 11.5 - 14.5 %    Platelets 524 (H) 150 - 400 K/uL    MPV 8.7 (L) 8.9 - 12.9 FL    Nucleated RBCs 0.0 0.0 PER 100 WBC    nRBC 0.00 0.00 - 0.01 K/uL    Neutrophils % 90 (H) 32 - 75 %    Lymphocytes % 5 (L) 12 - 49 %    Monocytes % 4 (L) 5 - 13 %    Eosinophils % 0 0 - 7 %    Basophils % 0 0 - 1 %    Immature Granulocytes 1 (H) 0 - 0.5 %    Neutrophils Absolute 19.4 (H) 1.8 - 8.0 K/UL    Lymphocytes Absolute 1.1 0.8 - 3.5 K/UL    Monocytes Absolute 0.9 0.0 - 1.0 K/UL    Eosinophils Absolute 0.0 0.0 - 0.4 K/UL    Basophils Absolute 0.1 0.0 - 0.1 K/UL    Absolute Immature Granulocyte 0.1 (H) 0.00 - 0.04 K/UL    Differential Type AUTOMATED     CMP    Collection Time: 10/18/21 11:22 AM   Result Value Ref Range    Sodium 144 136 - 145 mmol/L    Potassium Hemolyzed, Recollection Recommended 3.5 - 5.1 mmol/L    Chloride 109 (H) 97 - 108 mmol/L    CO2 24 21 - 32 mmol/L    Anion Gap 11 5 - 15 mmol/L    Glucose 189 (H) 65 - 100 mg/dL    BUN 42 (H) 6 - 20 mg/dL    Creatinine 1.79 (H) 0.55 - 1.02 mg/dL    Bun/Cre Ratio 23 (H) 12 - 20      Est, Glom Filt Rate 29 (L) >60 ml/min/1.52m    Calcium 9.3 8.5 - 10.1 mg/dL    Total Bilirubin 0.9 0.2 - 1.0 mg/dL    AST Hemolyzed, Recollection Recommended 15 - 37 U/L    ALT 16 12 - 78 U/L    Alk Phosphatase 138 (H) 45 - 117 U/L    Total Protein 7.7 6.4 - 8.2 g/dL    Albumin 3.9 3.5  - 5.0 g/dL    Globulin 3.8 2.0 - 4.0 g/dL    Albumin/Globulin Ratio 1.0 (L) 1.1 - 2.2     POC Blood Gas and Chemistry    Collection Time: 10/18/21 11:22 AM   Result Value Ref Range    pH, Arterial, POC 7.36 7.35 - 7.45      pCO2, Arterial, POC 44.9 35.0 - 45.0 mmHg    pO2, Arterial, POC <27 (LL) 75 - 100 mmHg    POC Sodium 145 136 - 145 mmol/L    POC Potassium 4.6 3.5 - 5.5 mmol/L    POC Ionized Calcium 1.17 1.12 -  1.32 mmol/L    POC Glucose 203 (H) 65 - 100 mg/dL    POC Chloride 108 (H) 98 - 107 MMOL/L    POC Creatinine 1.47 (H) 0.6 - 1.3 MG/DL    eGFR, POC 37 (L) >60 ml/min/1.81m    BASE DEFICIT (POC) 0.9 mmol/L    HCO3, Mixed 25.1 19.0 - 28.0 mmol/L    POC TCO2 25 MMOL/L    Source Venous      Performed by: CLoel Lofty    POC Lactic Acid 4.05 (HH) 0.40 - 2.00 mmol/L    Critical Value Read Back ZELENSKY    C-Reactive Protein    Collection Time: 10/18/21 11:22 AM   Result Value Ref Range    CRP 7.96 (H) 0.00 - 0.60 mg/dL   Urinalysis with Reflex to Culture    Collection Time: 10/18/21  1:51 PM    Specimen: Urine   Result Value Ref Range    Color, UA Amber      Appearance Clear Clear      Specific Gravity, UA >1.030 (H) 1.003 - 1.030    pH, Urine 5.0 5.0 - 8.0      Protein, UA 30 (A) Negative mg/dL    Glucose, UA Negative Negative mg/dL    Ketones, Urine 5 (A) Negative mg/dL    Bilirubin Urine Negative Negative      Blood, Urine Small (A) Negative      Urobilinogen, Urine 4.0 (H) 0.1 - 1.0 EU/dL    Nitrite, Urine Negative Negative      Leukocyte Esterase, Urine Moderate (A) Negative      WBC, UA 50-100 0 - 4 /hpf    RBC, UA 0-5 0 - 5 /hpf    Epithelial Cells UA Moderate (A) Few /lpf    BACTERIA, URINE 4+ (A) Negative /hpf    Urine Culture if Indicated Urine Culture Ordered (A) Culture not indicated by UA result      Mucus, UA Trace (A) Negative /lpf    Hyaline Casts, UA 2-5 0 - 5 /lpf   POC Lactic Acid    Collection Time: 10/18/21  2:38 PM   Result Value Ref Range    POC Lactic Acid 2.64 (HH) 0.40 - 2.00  mmol/L    Performed by: CLoel Lofty        CT ABDOMEN PELVIS W IV CONTRAST Additional Contrast? None   Final Result      1. Long segment wall thickening of the descending and sigmoid colon consistent   with infectious or inflammatory colitis.      2. No free fluid or fluid collections.      3. Chronic appearing fracture of the upper endplate of L2.      XR CHEST PORTABLE   Final Result      No acute process on portable chest.                Assessment/Plan:     Principal Problem:    Colitis  Resolved Problems:    * No resolved hospital problems. *  Infectious enterocolitis this could be C. difficile colitis as well I would recommend checking the stool for C. difficile and treating empirically for C. difficile pending results of stool studies    IP CONSULT TO GI  IP CONSULT TO SPIRITUAL SERVICES    Thank you for allowing me to participate in this patients care  Cc Referring Physician   MRigoberto Noel APRN - NP

## 2021-10-18 NOTE — ED Notes (Signed)
Report sheet sent. No telemetry box available. Notified bed coordinator who states they will re request box.      Loel Lofty, RN  10/18/21 1942

## 2021-10-18 NOTE — ED Triage Notes (Signed)
Weakness, and failure to thrive, not able to get around on her own, loose stools many times a day,

## 2021-10-18 NOTE — H&P (Addendum)
Admission History and Physical      NAME:  [name]   DOB:   [birth year]   MRN:  [id number]     PCP:  Rigoberto Noel, APRN - NP     Date/Time:  10/18/2021           Subjective:     CHIEF COMPLAINT: diarrhea    HISTORY OF PRESENT ILLNESS:     [name] is a 76 y.o.   F lives alone, recently discharged from the emergency room more than a week ago.  She returns to emergency room complaining of generalized weakness, intermittent dyspnea on exertion, diarrhea multiple episodes, with no abdominal pain, nausea or vomiting.  No rectal bleed or hematemesis.  No fever or chills.  No chest pain cough, orthopnea or leg edema.  Initial WBC count was elevated lactic was elevated, CT showed colitis.  GI was called recommended admission for IV antibiotic and further care.  When I saw the patient she was awake alert comfortable and she confirmed DNR status for me.  No other acute issues reported to me by nursing staff in the room at this time.    Past Medical History:   Diagnosis Date    Chronic obstructive pulmonary disease ([place])     Hypercholesteremia     Insomnia     Sleep disorder         Past Surgical History:   Procedure Laterality Date    ORTHOPEDIC SURGERY  05/26/2021    LEFT HIP REVISION POSTERIOR APPROACH WITH ALLOGRAFT    PARTIAL HYSTERECTOMY (CERVIX NOT REMOVED)      SHOULDER SURGERY Left        Social History     Tobacco Use    Smoking status: Former     Packs/day: 1     Types: Cigarettes     Quit date: 01/24/2019     Years since quitting: 2.7    Smokeless tobacco: Never   Substance Use Topics    Alcohol use: Yes     Alcohol/week: 1.0 standard drink of alcohol        No family history on file.     Allergies   Allergen Reactions    Cheese Nausea And Vomiting     Severe vomiting        Prior to Admission medications    Medication Sig Start Date End Date Taking? Authorizing Provider   atorvastatin (LIPITOR) 40 MG tablet Take 1 tablet by mouth daily 07/07/21   Historical Provider, MD   traZODone (DESYREL) 50 MG tablet  Take 1 tablet by mouth at bedtime 07/10/21   Historical Provider, MD   rosuvastatin (CRESTOR) 10 MG tablet Take 1 tablet by mouth daily    Historical Provider, MD   melatonin 5 MG TABS tablet Take 1 tablet by mouth nightly as needed (sleep) 06/09/21   Crawford Givens, MD   aspirin 81 MG EC tablet Take 1 tablet by mouth daily 06/09/21   Crawford Givens, MD   metoprolol succinate (TOPROL XL) 25 MG extended release tablet Take 0.5 tablets by mouth daily 06/09/21   Benedicta Sultan Theresia Lo, MD   celecoxib (CELEBREX) 100 MG capsule Take 1 capsule by mouth 2 times daily 06/08/21   Hansford Hirt U Bexley Mclester, MD   albuterol sulfate HFA (PROVENTIL;VENTOLIN;PROAIR) 108 (90 Base) MCG/ACT inhaler albuterol sulfate HFA 90 mcg/actuation aerosol inhaler   INHALE 2 PUFFS EVERY 6 HOURS AS NEEDED    Ar Automatic Reconciliation   fluticasone-umeclidin-vilant (TRELEGY ELLIPTA) 200-62.5-25 MCG/ACT AEPB  inhaler Trelegy Ellipta 200 mcg-62.5 mcg-25 mcg powder for inhalation   INHALE 1 PUFF BY MOUTH ONCE A DAY 08/19/20   Ar Automatic Reconciliation   omeprazole (PRILOSEC) 20 MG delayed release capsule Take 1 capsule by mouth daily 08/11/20   Ar Automatic Reconciliation   polyethylene glycol (GLYCOLAX) 17 GM/SCOOP powder Take 17 g by mouth daily 01/29/21   Ar Automatic Reconciliation   senna-docusate (PERICOLACE) 8.6-50 MG per tablet Take 1 tablet by mouth 2 times daily 01/28/21   Ar Automatic Reconciliation         Review of Systems:  Pertinent findings per HPI otherwise remaining 10 review of system is negative         Objective:      VITALS:    Vital signs reviewed; most recent are:    Vitals:    10/18/21 1345   BP:    Pulse:    Resp:    Temp:    SpO2: 96%     SpO2 Readings from Last 6 Encounters:   10/18/21 96%   10/09/21 95%   07/15/21 92%   06/09/21 97%    O2 Flow Rate (L/min): 2 L/min   No intake or output data in the 24 hours ending 10/18/21 1444         Exam:     Physical Exam:    Gen: Thin built, in no acute distress  HEENT:   hearing intact to voice, dry  mucous membranes  Neck:  Supple  Resp:  No accessory muscle use, CTA without wheezes rales or rhonchi  Card:  No  peripheral edema, RRR, Normal S1S2  Abd:  Soft, NT/ND   Lymph:  No visible  adenopathy  Musc:  Good ROM, no visible deformities  Skin:  No rashes    Neuro:  Alert, follows commands appropriately, stuttering of speech noted  Psych:  Normal Affect, good  insight.          Labs:  Recent Results (from the past 18 hour(s))   CBC with Auto Differential    Collection Time: 10/18/21 11:22 AM   Result Value Ref Range    WBC 21.7 (H) 3.6 - 11.0 K/uL    RBC 4.81 3.80 - 5.20 M/uL    Hemoglobin 13.9 11.5 - 16.0 g/dL    Hematocrit 43.7 35.0 - 47.0 %    MCV 90.9 80.0 - 99.0 FL    MCH 28.9 26.0 - 34.0 PG    MCHC 31.8 30.0 - 36.5 g/dL    RDW 15.9 (H) 11.5 - 14.5 %    Platelets 524 (H) 150 - 400 K/uL    MPV 8.7 (L) 8.9 - 12.9 FL    Nucleated RBCs 0.0 0.0 PER 100 WBC    nRBC 0.00 0.00 - 0.01 K/uL    Neutrophils % 90 (H) 32 - 75 %    Lymphocytes % 5 (L) 12 - 49 %    Monocytes % 4 (L) 5 - 13 %    Eosinophils % 0 0 - 7 %    Basophils % 0 0 - 1 %    Immature Granulocytes 1 (H) 0 - 0.5 %    Neutrophils Absolute 19.4 (H) 1.8 - 8.0 K/UL    Lymphocytes Absolute 1.1 0.8 - 3.5 K/UL    Monocytes Absolute 0.9 0.0 - 1.0 K/UL    Eosinophils Absolute 0.0 0.0 - 0.4 K/UL    Basophils Absolute 0.1 0.0 - 0.1 K/UL    Absolute Immature Granulocyte 0.1 (H)  0.00 - 0.04 K/UL    Differential Type AUTOMATED     CMP    Collection Time: 10/18/21 11:22 AM   Result Value Ref Range    Sodium 144 136 - 145 mmol/L    Potassium Hemolyzed, Recollection Recommended 3.5 - 5.1 mmol/L    Chloride 109 (H) 97 - 108 mmol/L    CO2 24 21 - 32 mmol/L    Anion Gap 11 5 - 15 mmol/L    Glucose 189 (H) 65 - 100 mg/dL    BUN 42 (H) 6 - 20 mg/dL    Creatinine 1.79 (H) 0.55 - 1.02 mg/dL    Bun/Cre Ratio 23 (H) 12 - 20      Est, Glom Filt Rate 29 (L) >60 ml/min/1.22m    Calcium 9.3 8.5 - 10.1 mg/dL    Total Bilirubin 0.9 0.2 - 1.0 mg/dL    AST Hemolyzed, Recollection  Recommended 15 - 37 U/L    ALT 16 12 - 78 U/L    Alk Phosphatase 138 (H) 45 - 117 U/L    Total Protein 7.7 6.4 - 8.2 g/dL    Albumin 3.9 3.5 - 5.0 g/dL    Globulin 3.8 2.0 - 4.0 g/dL    Albumin/Globulin Ratio 1.0 (L) 1.1 - 2.2     POC Blood Gas and Chemistry    Collection Time: 10/18/21 11:22 AM   Result Value Ref Range    pH, Arterial, POC 7.36 7.35 - 7.45      pCO2, Arterial, POC 44.9 35.0 - 45.0 mmHg    pO2, Arterial, POC <27 (LL) 75 - 100 mmHg    POC Sodium 145 136 - 145 mmol/L    POC Potassium 4.6 3.5 - 5.5 mmol/L    POC Ionized Calcium 1.17 1.12 - 1.32 mmol/L    POC Glucose 203 (H) 65 - 100 mg/dL    POC Chloride 108 (H) 98 - 107 MMOL/L    POC Creatinine 1.47 (H) 0.6 - 1.3 MG/DL    eGFR, POC 37 (L) >60 ml/min/1.754m   BASE DEFICIT (POC) 0.9 mmol/L    HCO3, Mixed 25.1 19.0 - 28.0 mmol/L    POC TCO2 25 MMOL/L    Source Venous      Performed by: CiLoel Lofty   POC Lactic Acid 4.05 (HH) 0.40 - 2.00 mmol/L    Critical Value Read Back ZELENSKY    Urinalysis with Reflex to Culture    Collection Time: 10/18/21  1:51 PM    Specimen: Urine   Result Value Ref Range    Color, UA Amber      Appearance Clear Clear      Specific Gravity, UA >1.030 (H) 1.003 - 1.030    pH, Urine 5.0 5.0 - 8.0      Protein, UA 30 (A) Negative mg/dL    Glucose, UA Negative Negative mg/dL    Ketones, Urine 5 (A) Negative mg/dL    Bilirubin Urine Negative Negative      Blood, Urine Small (A) Negative      Urobilinogen, Urine 4.0 (H) 0.1 - 1.0 EU/dL    Nitrite, Urine Negative Negative      Leukocyte Esterase, Urine Moderate (A) Negative      WBC, UA 50-100 0 - 4 /hpf    RBC, UA 0-5 0 - 5 /hpf    Epithelial Cells UA Moderate (A) Few /lpf    BACTERIA, URINE 4+ (A) Negative /hpf    Urine Culture if Indicated Urine  Culture Ordered (A) Culture not indicated by UA result      Mucus, UA Trace (A) Negative /lpf    Hyaline Casts, UA 2-5 0 - 5 /lpf   POC Lactic Acid    Collection Time: 10/18/21  2:38 PM   Result Value Ref Range    POC Lactic Acid  2.64 (HH) 0.40 - 2.00 mmol/L    Performed by: Loel Lofty      CT abdomen and pelvis-IMPRESSION:     1. Long segment wall thickening of the descending and sigmoid colon consistent  with infectious or inflammatory colitis.     2. No free fluid or fluid collections.     3. Chronic appearing fracture of the upper endplate of L2.        Assesment and Plan:    Acute colitis-  CT as above  Lactic elevated rule out ischemia  Empiric IV antibiotic Zosyn  CRP  Procalcitonin  N.p.o. with sips of water  IV fluids  GI consult  C. 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 Pt needs IV fluids with additives and Drug therapy requiring intensive monitoring for toxicity. In addition, prior medical, surgical and relevant social and family histories were reviewed. I have discussed patient's presentation/findings and clinical course to date with ED provider/nursing staff and agree with admission plan at this time. Patient with multiple medical comorbidities, each with high likelihood for morbidity and mortality if left untreated.  Given the patient's current clinical presentation, I have a high level of concern for decompensation if discharged from the emergency department and that patient warrants admission to hospital.       AD DNR  DVT Prx -subcu Heparin  NOK -Sister Per patient  Social Determinants of Health-lives  alone    Disposition-more than 48 H anticipate possible SNF placement         Current Facility-Administered Medications   Medication Dose Route Frequency    lactated ringers bolus bolus 1,000 mL  1,000 mL IntraVENous Once    piperacillin-tazobactam (ZOSYN) 4,500 mg in sodium chloride 0.9 % 100 mL IVPB (mini-bag)  4,500 mg IntraVENous NOW    piperacillin-tazobactam (ZOSYN) 2,250 mg in sodium chloride 0.9 % 50 mL IVPB (mini-bag)  2,250 mg IntraVENous Q8H    sodium chloride flush 0.9 % injection 5-40 mL  5-40 mL IntraVENous 2 times per day    sodium chloride flush 0.9 % injection 5-40 mL  5-40 mL IntraVENous PRN    0.9 % sodium chloride infusion   IntraVENous PRN    heparin (porcine) injection 5,000 Units  5,000 Units SubCUTAneous BID    ondansetron (ZOFRAN-ODT) disintegrating tablet 4 mg  4 mg Oral Q8H PRN    Or    ondansetron (ZOFRAN) injection 4 mg  4 mg IntraVENous Q6H PRN    polyethylene glycol (GLYCOLAX) packet 17 g  17 g Oral Daily PRN  acetaminophen (TYLENOL) tablet 650 mg  650 mg Oral Q6H PRN    Or    acetaminophen (TYLENOL) suppository 650 mg  650 mg Rectal Q6H PRN    0.9 % sodium chloride infusion   IntraVENous Continuous    albuterol sulfate HFA (PROVENTIL;VENTOLIN;PROAIR) 108 (90 Base) MCG/ACT inhaler 2 puff  2 puff Inhalation Q4H PRN    aspirin EC tablet 81 mg  81 mg Oral Daily    atorvastatin (LIPITOR) tablet 40 mg  40 mg Oral Daily    fluticasone-umeclidin-vilant (TRELEGY ELLIPTA) 200-62.5-25 MCG/ACT inhaler 1 puff  1 puff Inhalation Daily    melatonin tablet 5 mg  5 mg Oral Nightly PRN    metoprolol succinate (TOPROL XL) extended release tablet 12.5 mg  12.5 mg Oral BID    [START ON 10/19/2021] pantoprazole (PROTONIX) tablet 40 mg  40 mg Oral QAM AC    traZODone (DESYREL) tablet 50 mg  50 mg Oral Nightly     Current Outpatient Medications   Medication Sig    atorvastatin (LIPITOR) 40 MG tablet Take 1 tablet by mouth daily    traZODone (DESYREL) 50 MG tablet Take 1 tablet by mouth at bedtime     rosuvastatin (CRESTOR) 10 MG tablet Take 1 tablet by mouth daily    melatonin 5 MG TABS tablet Take 1 tablet by mouth nightly as needed (sleep)    aspirin 81 MG EC tablet Take 1 tablet by mouth daily    metoprolol succinate (TOPROL XL) 25 MG extended release tablet Take 0.5 tablets by mouth daily    celecoxib (CELEBREX) 100 MG capsule Take 1 capsule by mouth 2 times daily    albuterol sulfate HFA (PROVENTIL;VENTOLIN;PROAIR) 108 (90 Base) MCG/ACT inhaler albuterol sulfate HFA 90 mcg/actuation aerosol inhaler   INHALE 2 PUFFS EVERY 6 HOURS AS NEEDED    fluticasone-umeclidin-vilant (TRELEGY ELLIPTA) 200-62.5-25 MCG/ACT AEPB inhaler Trelegy Ellipta 200 mcg-62.5 mcg-25 mcg powder for inhalation   INHALE 1 PUFF BY MOUTH ONCE A DAY    omeprazole (PRILOSEC) 20 MG delayed release capsule Take 1 capsule by mouth daily    polyethylene glycol (GLYCOLAX) 17 GM/SCOOP powder Take 17 g by mouth daily    senna-docusate (PERICOLACE) 8.6-50 MG per tablet Take 1 tablet by mouth 2 times daily        This is dictation was done by dragon, computer voice recognition software.  Quite often unanticipated grammatical, syntax, homophones and other interpretive errors or inadvertently transcribed by the computer software.  Please excuse errors that have escaped final proofreading. Please contact me for any questions or details.  Thank you.   ___________________________________________________    Attending Physician: Leana Roe, MD   10/18/2021

## 2021-10-18 NOTE — ED Notes (Signed)
Report sheet tubed to [facility] notifed.      Loel Lofty, RN  10/18/21 1820

## 2021-10-18 NOTE — ED Provider Notes (Signed)
[facility] EMERGENCY DEPT  EMERGENCY DEPARTMENT HISTORY AND PHYSICAL EXAM      Date: 10/18/2021  Patient Name: [name]  MRN: [id number]  Accident: November 26, 1945  Date of evaluation: 10/18/2021  Provider: Earnestine Mealing, MD   Note Started: 11:45 AM EDT 10/18/21    HISTORY OF PRESENT ILLNESS     Chief Complaint   Patient presents with    Fatigue    Fall       History Provided By: Patient    HPI: [name] is a 76 y.o. female presents to the emergency department for evaluation of generalized weakness, difficulty ambulating and frequent loose stools.  Patient states over the last several days she has felt increased weakness, and has noted multiple episodes of loose stool.  Denies any black or tarry stool, no blood in stool, no abdominal pain nausea or vomiting, no fevers or chills.    PAST MEDICAL HISTORY   Past Medical History:  Past Medical History:   Diagnosis Date    Chronic obstructive pulmonary disease ([place])     Hypercholesteremia     Insomnia     Sleep disorder        Past Surgical History:  Past Surgical History:   Procedure Laterality Date    ORTHOPEDIC SURGERY  05/26/2021    LEFT HIP REVISION POSTERIOR APPROACH WITH ALLOGRAFT    PARTIAL HYSTERECTOMY (CERVIX NOT REMOVED)      SHOULDER SURGERY Left        Family History:  No family history on file.    Social History:  Social History     Tobacco Use    Smoking status: Former     Packs/day: 1     Types: Cigarettes     Quit date: 01/24/2019     Years since quitting: 2.7    Smokeless tobacco: Never   Substance Use Topics    Alcohol use: Yes     Alcohol/week: 1.0 standard drink of alcohol    Drug use: Never       Allergies:  Allergies   Allergen Reactions    Cheese Nausea And Vomiting     Severe vomiting       PCP: Rigoberto Noel, APRN - NP    Current Meds:   Current Facility-Administered Medications   Medication Dose Route Frequency Provider Last Rate Last Admin    lactated ringers bolus bolus 1,000 mL  1,000 mL IntraVENous Once Sanda Klein, MD 495.9 mL/hr at 10/18/21  1351 1,000 mL at 10/18/21 1351    piperacillin-tazobactam (ZOSYN) 4,500 mg in sodium chloride 0.9 % 100 mL IVPB (mini-bag)  4,500 mg IntraVENous NOW Sanda Klein, MD 200 mL/hr at 10/18/21 1441 4,500 mg at 10/18/21 1441    piperacillin-tazobactam (ZOSYN) 2,250 mg in sodium chloride 0.9 % 50 mL IVPB (mini-bag)  2,250 mg IntraVENous Q8H Sami U Haq, MD        sodium chloride flush 0.9 % injection 5-40 mL  5-40 mL IntraVENous 2 times per day Sami U Tonye Royalty, MD        sodium chloride flush 0.9 % injection 5-40 mL  5-40 mL IntraVENous PRN Sami U Haq, MD        0.9 % sodium chloride infusion   IntraVENous PRN Sami Theresia Lo, MD        heparin (porcine) injection 5,000 Units  5,000 Units SubCUTAneous BID Sami Theresia Lo, MD        ondansetron (ZOFRAN-ODT) disintegrating tablet 4 mg  4 mg Oral Q8H  PRN Sami Theresia Lo, MD        Or    ondansetron (ZOFRAN) injection 4 mg  4 mg IntraVENous Q6H PRN Sami Theresia Lo, MD        polyethylene glycol (GLYCOLAX) packet 17 g  17 g Oral Daily PRN Sami Theresia Lo, MD        acetaminophen (TYLENOL) tablet 650 mg  650 mg Oral Q6H PRN Sami Theresia Lo, MD        Or    acetaminophen (TYLENOL) suppository 650 mg  650 mg Rectal Q6H PRN Sami Theresia Lo, MD        0.9 % sodium chloride infusion   IntraVENous Continuous Sami Theresia Lo, MD        albuterol sulfate HFA (PROVENTIL;VENTOLIN;PROAIR) 108 (90 Base) MCG/ACT inhaler 2 puff  2 puff Inhalation Q4H PRN Sami Theresia Lo, MD        aspirin EC tablet 81 mg  81 mg Oral Daily Sami Theresia Lo, MD        atorvastatin (LIPITOR) tablet 40 mg  40 mg Oral Daily Sami Theresia Lo, MD        fluticasone-umeclidin-vilant (TRELEGY ELLIPTA) 200-62.5-25 MCG/ACT inhaler 1 puff  1 puff Inhalation Daily Sami Theresia Lo, MD        melatonin tablet 5 mg  5 mg Oral Nightly PRN Sami Theresia Lo, MD        metoprolol succinate (TOPROL XL) extended release tablet 12.5 mg  12.5 mg Oral BID Sami Theresia Lo, MD        [START ON 10/19/2021] pantoprazole (PROTONIX) tablet 40 mg  40 mg Oral QAM AC Sami Theresia Lo, MD        traZODone (DESYREL) tablet 50  mg  50 mg Oral Nightly Sami Theresia Lo, MD        hydrALAZINE (APRESOLINE) injection 10 mg  10 mg IntraVENous Q4H PRN Sami Theresia Lo, MD        cloNIDine (CATAPRES) tablet 0.1 mg  0.1 mg Oral Q8H PRN Sami Theresia Lo, MD         Current Outpatient Medications   Medication Sig Dispense Refill    atorvastatin (LIPITOR) 40 MG tablet Take 1 tablet by mouth daily      traZODone (DESYREL) 50 MG tablet Take 1 tablet by mouth at bedtime      rosuvastatin (CRESTOR) 10 MG tablet Take 1 tablet by mouth daily      melatonin 5 MG TABS tablet Take 1 tablet by mouth nightly as needed (sleep) 90 tablet 0    aspirin 81 MG EC tablet Take 1 tablet by mouth daily 30 tablet 3    metoprolol succinate (TOPROL XL) 25 MG extended release tablet Take 0.5 tablets by mouth daily 30 tablet 3    celecoxib (CELEBREX) 100 MG capsule Take 1 capsule by mouth 2 times daily 60 capsule 3    albuterol sulfate HFA (PROVENTIL;VENTOLIN;PROAIR) 108 (90 Base) MCG/ACT inhaler albuterol sulfate HFA 90 mcg/actuation aerosol inhaler   INHALE 2 PUFFS EVERY 6 HOURS AS NEEDED      fluticasone-umeclidin-vilant (TRELEGY ELLIPTA) 200-62.5-25 MCG/ACT AEPB inhaler Trelegy Ellipta 200 mcg-62.5 mcg-25 mcg powder for inhalation   INHALE 1 PUFF BY MOUTH ONCE A DAY      omeprazole (PRILOSEC) 20 MG delayed release capsule Take 1 capsule by mouth daily      polyethylene glycol (GLYCOLAX) 17 GM/SCOOP powder Take 17 g by mouth daily      senna-docusate (PERICOLACE) 8.6-50 MG  per tablet Take 1 tablet by mouth 2 times daily         Social Determinants of Health:   Social Determinants of Health     Tobacco Use: Medium Risk (07/15/2021)    Patient History     Smoking Tobacco Use: Former     Smokeless Tobacco Use: Never     Passive Exposure: Not on file   Alcohol Use: Not At Risk (10/09/2021)    AUDIT-C     Frequency of Alcohol Consumption: Never     Average Number of Drinks: Patient does not drink     Frequency of Binge Drinking: Never   Financial Resource Strain: Not on file   Food Insecurity: Not  on file   Transportation Needs: Not on file   Physical Activity: Not on file   Stress: Not on file   Social Connections: Not on file   Intimate Partner Violence: Not on file   Depression: Not at risk (07/15/2021)    PHQ-2     PHQ-2 Score: 0   Housing Stability: Not on file       PHYSICAL EXAM   Physical Exam  Constitutional:       Appearance: Normal appearance. She is normal weight.   HENT:      Head: Normocephalic and atraumatic.      Nose: Nose normal. No congestion.      Mouth/Throat:      Mouth: Mucous membranes are moist.      Pharynx: Oropharynx is clear.   Eyes:      Extraocular Movements: Extraocular movements intact.      Conjunctiva/sclera: Conjunctivae normal.   Cardiovascular:      Rate and Rhythm: Regular rhythm. Tachycardia present.      Pulses: Normal pulses.      Heart sounds: Normal heart sounds.   Pulmonary:      Effort: Pulmonary effort is normal. No respiratory distress.      Breath sounds: Normal breath sounds.   Abdominal:      General: Abdomen is flat.      Tenderness: There is abdominal tenderness.      Comments: Diffuse abdominal pain no rebound or guarding   Musculoskeletal:         General: No swelling or tenderness. Normal range of motion.      Cervical back: Normal range of motion and neck supple.   Skin:     General: Skin is warm.   Neurological:      General: No focal deficit present.      Mental Status: She is alert.      Cranial Nerves: No cranial nerve deficit.           SCREENINGS                  LAB, EKG AND DIAGNOSTIC RESULTS   Labs:  Recent Results (from the past 12 hour(s))   CBC with Auto Differential    Collection Time: 10/18/21 11:22 AM   Result Value Ref Range    WBC 21.7 (H) 3.6 - 11.0 K/uL    RBC 4.81 3.80 - 5.20 M/uL    Hemoglobin 13.9 11.5 - 16.0 g/dL    Hematocrit 43.7 35.0 - 47.0 %    MCV 90.9 80.0 - 99.0 FL    MCH 28.9 26.0 - 34.0 PG    MCHC 31.8 30.0 - 36.5 g/dL    RDW 15.9 (H) 11.5 - 14.5 %    Platelets 524 (H) 150 - 400  K/uL    MPV 8.7 (L) 8.9 - 12.9 FL     Nucleated RBCs 0.0 0.0 PER 100 WBC    nRBC 0.00 0.00 - 0.01 K/uL    Neutrophils % 90 (H) 32 - 75 %    Lymphocytes % 5 (L) 12 - 49 %    Monocytes % 4 (L) 5 - 13 %    Eosinophils % 0 0 - 7 %    Basophils % 0 0 - 1 %    Immature Granulocytes 1 (H) 0 - 0.5 %    Neutrophils Absolute 19.4 (H) 1.8 - 8.0 K/UL    Lymphocytes Absolute 1.1 0.8 - 3.5 K/UL    Monocytes Absolute 0.9 0.0 - 1.0 K/UL    Eosinophils Absolute 0.0 0.0 - 0.4 K/UL    Basophils Absolute 0.1 0.0 - 0.1 K/UL    Absolute Immature Granulocyte 0.1 (H) 0.00 - 0.04 K/UL    Differential Type AUTOMATED     CMP    Collection Time: 10/18/21 11:22 AM   Result Value Ref Range    Sodium 144 136 - 145 mmol/L    Potassium Hemolyzed, Recollection Recommended 3.5 - 5.1 mmol/L    Chloride 109 (H) 97 - 108 mmol/L    CO2 24 21 - 32 mmol/L    Anion Gap 11 5 - 15 mmol/L    Glucose 189 (H) 65 - 100 mg/dL    BUN 42 (H) 6 - 20 mg/dL    Creatinine 1.79 (H) 0.55 - 1.02 mg/dL    Bun/Cre Ratio 23 (H) 12 - 20      Est, Glom Filt Rate 29 (L) >60 ml/min/1.33m    Calcium 9.3 8.5 - 10.1 mg/dL    Total Bilirubin 0.9 0.2 - 1.0 mg/dL    AST Hemolyzed, Recollection Recommended 15 - 37 U/L    ALT 16 12 - 78 U/L    Alk Phosphatase 138 (H) 45 - 117 U/L    Total Protein 7.7 6.4 - 8.2 g/dL    Albumin 3.9 3.5 - 5.0 g/dL    Globulin 3.8 2.0 - 4.0 g/dL    Albumin/Globulin Ratio 1.0 (L) 1.1 - 2.2     POC Blood Gas and Chemistry    Collection Time: 10/18/21 11:22 AM   Result Value Ref Range    pH, Arterial, POC 7.36 7.35 - 7.45      pCO2, Arterial, POC 44.9 35.0 - 45.0 mmHg    pO2, Arterial, POC <27 (LL) 75 - 100 mmHg    POC Sodium 145 136 - 145 mmol/L    POC Potassium 4.6 3.5 - 5.5 mmol/L    POC Ionized Calcium 1.17 1.12 - 1.32 mmol/L    POC Glucose 203 (H) 65 - 100 mg/dL    POC Chloride 108 (H) 98 - 107 MMOL/L    POC Creatinine 1.47 (H) 0.6 - 1.3 MG/DL    eGFR, POC 37 (L) >60 ml/min/1.764m   BASE DEFICIT (POC) 0.9 mmol/L    HCO3, Mixed 25.1 19.0 - 28.0 mmol/L    POC TCO2 25 MMOL/L    Source Venous       Performed by: CiLoel Lofty   POC Lactic Acid 4.05 (HH) 0.40 - 2.00 mmol/L    Critical Value Read Back Tong Pieczynski    Urinalysis with Reflex to Culture    Collection Time: 10/18/21  1:51 PM    Specimen: Urine   Result Value Ref Range    Color, UA Amber  Appearance Clear Clear      Specific Gravity, UA >1.030 (H) 1.003 - 1.030    pH, Urine 5.0 5.0 - 8.0      Protein, UA 30 (A) Negative mg/dL    Glucose, UA Negative Negative mg/dL    Ketones, Urine 5 (A) Negative mg/dL    Bilirubin Urine Negative Negative      Blood, Urine Small (A) Negative      Urobilinogen, Urine 4.0 (H) 0.1 - 1.0 EU/dL    Nitrite, Urine Negative Negative      Leukocyte Esterase, Urine Moderate (A) Negative      WBC, UA 50-100 0 - 4 /hpf    RBC, UA 0-5 0 - 5 /hpf    Epithelial Cells UA Moderate (A) Few /lpf    BACTERIA, URINE 4+ (A) Negative /hpf    Urine Culture if Indicated Urine Culture Ordered (A) Culture not indicated by UA result      Mucus, UA Trace (A) Negative /lpf    Hyaline Casts, UA 2-5 0 - 5 /lpf   POC Lactic Acid    Collection Time: 10/18/21  2:38 PM   Result Value Ref Range    POC Lactic Acid 2.64 (HH) 0.40 - 2.00 mmol/L    Performed by: Loel Lofty        EKG: EKG interpreted by me. Shows sinus tachycardia 113, no ST elevations, normal axis, normal intervals    Radiologic Studies:  Non-plain film images such as CT, Ultrasound and MRI are read by the radiologist. Plain radiographic images are visualized and preliminarily interpreted by the ED Physician with the following findings: Not Applicable.    Interpretation per the Radiologist below, if available at the time of this note:  CT ABDOMEN PELVIS W IV CONTRAST Additional Contrast? None   Final Result      1. Long segment wall thickening of the descending and sigmoid colon consistent   with infectious or inflammatory colitis.      2. No free fluid or fluid collections.      3. Chronic appearing fracture of the upper endplate of L2.      XR CHEST PORTABLE   Final  Result      No acute process on portable chest.              ED COURSE and DIFFERENTIAL DIAGNOSIS/MDM   CC/HPI Summary, DDx, ED Course, and Reassessment: 76 year old female history of hypertension, presents emergency department for evaluation of increased weakness, multiple episodes of loose stool over the last 48 hours.    Physical shows elderly female, in no distress mild tachycardia in triage, normotensive afebrile, abdomen soft diffusely tender to palpation however no rebound or guarding.    Differential diagnosis includes colitis, dehydration, electrolyte abnormality, C. difficile, UTI.    We will draw basic lab work, UA, obtain CT abdomen pelvis, C. difficile toxin.    Records Reviewed (source and summary of external notes): Prior medical records and Nursing notes    Vitals:    Vitals:    10/18/21 1040 10/18/21 1330 10/18/21 1345   BP: (!) 136/92 (!) 162/96    Pulse: (!) 115 (!) 101    Resp:  22    Temp: 98.7 F (37.1 C)     TempSrc: Oral     SpO2: 90%  96%   Weight: 41.7 kg (92 lb)     Height: 1.6 m (5' 3" )          ED COURSE  ED Course  as of 10/18/21 1504   Tue Oct 18, 2021   1353 Pts lab work resulting, CBC shows marked leukocytosis 21,000 with shift, CMP shows renal injury BUN 42 creatinine 1.79.  Patient CT abdomen pelvis resulted by radiology, consistent with infectious colitis.  C. difficile still pending, will order Zosyn 3.5 g, additionally point-of-care lactate was noted to be elevated on triage.  Patient has received 2 L of LR, repeat lactate pending. [PZ]   8546 Spoke with Dr. Donneta Romberg, will admit patient for colitis requesting GI consult be placed. [PZ]      ED Course User Index  [PZ] Sanda Klein, MD       Disposition Considerations (Tests not done, Shared Decision Making, Pt Expectation of Test or Treatment.): Not Applicable    Patient was given the following medications:  Medications   lactated ringers bolus bolus 1,000 mL (1,000 mLs IntraVENous New Bag 10/18/21 1351)   piperacillin-tazobactam  (ZOSYN) 4,500 mg in sodium chloride 0.9 % 100 mL IVPB (mini-bag) (4,500 mg IntraVENous New Bag 10/18/21 1441)   piperacillin-tazobactam (ZOSYN) 2,250 mg in sodium chloride 0.9 % 50 mL IVPB (mini-bag) (has no administration in time range)   sodium chloride flush 0.9 % injection 5-40 mL (has no administration in time range)   sodium chloride flush 0.9 % injection 5-40 mL (has no administration in time range)   0.9 % sodium chloride infusion (has no administration in time range)   heparin (porcine) injection 5,000 Units (has no administration in time range)   ondansetron (ZOFRAN-ODT) disintegrating tablet 4 mg (has no administration in time range)     Or   ondansetron (ZOFRAN) injection 4 mg (has no administration in time range)   polyethylene glycol (GLYCOLAX) packet 17 g (has no administration in time range)   acetaminophen (TYLENOL) tablet 650 mg (has no administration in time range)     Or   acetaminophen (TYLENOL) suppository 650 mg (has no administration in time range)   0.9 % sodium chloride infusion (has no administration in time range)   albuterol sulfate HFA (PROVENTIL;VENTOLIN;PROAIR) 108 (90 Base) MCG/ACT inhaler 2 puff (has no administration in time range)   aspirin EC tablet 81 mg (has no administration in time range)   atorvastatin (LIPITOR) tablet 40 mg (has no administration in time range)   fluticasone-umeclidin-vilant (TRELEGY ELLIPTA) 200-62.5-25 MCG/ACT inhaler 1 puff (1 puff Inhalation Not Given 10/18/21 1432)   melatonin tablet 5 mg (has no administration in time range)   metoprolol succinate (TOPROL XL) extended release tablet 12.5 mg (has no administration in time range)   pantoprazole (PROTONIX) tablet 40 mg (has no administration in time range)   traZODone (DESYREL) tablet 50 mg (has no administration in time range)   hydrALAZINE (APRESOLINE) injection 10 mg (has no administration in time range)   cloNIDine (CATAPRES) tablet 0.1 mg (has no administration in time range)   iopamidol (ISOVUE-370)  76 % injection 100 mL (100 mLs IntraVENous Given 10/18/21 1231)       CONSULTS: (Who and What was discussed)  IP CONSULT TO GI     Social Determinants affecting Dx or Tx: None    Smoking Cessation: Not Applicable    PROCEDURES   Unless otherwise noted above, none.  Procedures      CRITICAL CARE TIME   Patient does not meet Critical Care Time, 0 minutes    FINAL IMPRESSION     1. Infectious colitis          DISPOSITION/PLAN   DISPOSITION  Admitted 10/18/2021 02:20:43 PM    Admit Note: Pt is being admitted by hospitalist team. The results of their tests and reason(s) for their admission have been discussed with pt and/or available family. They convey agreement and understanding for the need to be admitted and for the admission diagnosis.     PATIENT REFERRED TO:  No follow-up provider specified.      DISCHARGE MEDICATIONS:     Medication List        ASK your doctor about these medications      albuterol sulfate HFA 108 (90 Base) MCG/ACT inhaler  Commonly known as: PROVENTIL;VENTOLIN;PROAIR     aspirin 81 MG EC tablet  Take 1 tablet by mouth daily     atorvastatin 40 MG tablet  Commonly known as: LIPITOR     celecoxib 100 MG capsule  Commonly known as: CELEBREX  Take 1 capsule by mouth 2 times daily     melatonin 5 MG Tabs tablet  Take 1 tablet by mouth nightly as needed (sleep)     metoprolol succinate 25 MG extended release tablet  Commonly known as: TOPROL XL  Take 0.5 tablets by mouth daily     omeprazole 20 MG delayed release capsule  Commonly known as: PRILOSEC     polyethylene glycol 17 GM/SCOOP powder  Commonly known as: GLYCOLAX     rosuvastatin 10 MG tablet  Commonly known as: CRESTOR     senna-docusate 8.6-50 MG per tablet  Commonly known as: PERICOLACE     traZODone 50 MG tablet  Commonly known as: DESYREL     Trelegy Ellipta 200-62.5-25 MCG/ACT Aepb inhaler  Generic drug: fluticasone-umeclidin-vilant                DISCONTINUED MEDICATIONS:  Current Discharge Medication List          I am the Primary  Clinician of Record: Earnestine Mealing, MD (electronically signed)    (Please note that parts of this dictation were completed with voice recognition software. Quite often unanticipated grammatical, syntax, homophones, and other interpretive errors are inadvertently transcribed by the computer software. Please disregards these errors. Please excuse any errors that have escaped final proofreading.)     Sanda Klein, MD  10/18/21 1505

## 2021-10-19 LAB — BASIC METABOLIC PANEL W/ REFLEX TO MG FOR LOW K
Anion Gap: 8 mmol/L (ref 5–15)
BUN: 32 mg/dL — ABNORMAL HIGH (ref 6–20)
Bun/Cre Ratio: 38 — ABNORMAL HIGH (ref 12–20)
CO2: 25 mmol/L (ref 21–32)
Calcium: 8.8 mg/dL (ref 8.5–10.1)
Chloride: 110 mmol/L — ABNORMAL HIGH (ref 97–108)
Creatinine: 0.85 mg/dL (ref 0.55–1.02)
Est, Glom Filt Rate: >60 mL/min/{1.73_m2} (ref >60)
Glucose: 89 mg/dL (ref 65–100)
Potassium: 3.8 mmol/L (ref 3.5–5.1)
Sodium: 143 mmol/L (ref 136–145)

## 2021-10-19 LAB — CBC WITH AUTO DIFFERENTIAL
Absolute Immature Granulocyte: 0 10*3/uL
Basophils %: 0 % (ref 0–1)
Basophils Absolute: 0 10*3/uL (ref 0.0–0.1)
Eosinophils %: 0 % (ref 0–7)
Eosinophils Absolute: 0 10*3/uL (ref 0.0–0.4)
Hematocrit: 35.5 % (ref 35.0–47.0)
Hemoglobin: 11.2 g/dL — ABNORMAL LOW (ref 11.5–16.0)
Immature Granulocytes: 0 %
Lymphocytes %: 6 % — ABNORMAL LOW (ref 12–49)
Lymphocytes Absolute: 1 10*3/uL (ref 0.8–3.5)
MCH: 28.9 PG (ref 26.0–34.0)
MCHC: 31.5 g/dL (ref 30.0–36.5)
MCV: 91.7 FL (ref 80.0–99.0)
MPV: 8.9 FL (ref 8.9–12.9)
Monocytes %: 6 % (ref 5–13)
Monocytes Absolute: 1 10*3/uL (ref 0.0–1.0)
Neutrophils %: 88 % — ABNORMAL HIGH (ref 32–75)
Neutrophils Absolute: 14.9 10*3/uL — ABNORMAL HIGH (ref 1.8–8.0)
Nucleated RBCs: 0 PER 100 WBC
Platelets: 347 10*3/uL (ref 150–400)
RBC: 3.87 M/uL (ref 3.80–5.20)
RDW: 15.7 % — ABNORMAL HIGH (ref 11.5–14.5)
WBC: 16.9 10*3/uL — ABNORMAL HIGH (ref 3.6–11.0)
nRBC: 0 10*3/uL (ref 0.00–0.01)

## 2021-10-19 LAB — LACTIC ACID
Lactic Acid, Plasma: 1.8 mmol/L (ref 0.4–2.0)
Lactic Acid, Plasma: 0.8 mmol/L (ref 0.4–2.0)

## 2021-10-19 LAB — C DIFF TOXIN/ANTIGEN
C Diff Toxin Interpretation: NEGATIVE
C difficile Toxin, EIA: NEGATIVE
GDH Antigen: NEGATIVE

## 2021-10-19 LAB — C-REACTIVE PROTEIN: CRP: 9.01 mg/dL — ABNORMAL HIGH (ref 0.00–0.60)

## 2021-10-19 MED ORDER — METRONIDAZOLE 500 MG PO TABS
500 MG | Freq: Three times a day (TID) | ORAL | Status: DC
Start: 2021-10-19 — End: 2021-10-21
  Administered 2021-10-19 – 2021-10-21 (×7): 500 mg via ORAL

## 2021-10-19 MED FILL — PIPERACILLIN SOD-TAZOBACTAM SO 3.375 (3-0.375) G IV SOLR: 3.375 (3-0.375) g | INTRAVENOUS | Qty: 3375

## 2021-10-19 MED FILL — METRONIDAZOLE 500 MG PO TABS: 500 MG | ORAL | Qty: 1

## 2021-10-19 MED FILL — HEPARIN SODIUM (PORCINE) 5000 UNIT/ML IJ SOLN: 5000 UNIT/ML | INTRAMUSCULAR | Qty: 1

## 2021-10-19 MED FILL — BUDESONIDE-FORMOTEROL FUMARATE 160-4.5 MCG/ACT IN AERO: RESPIRATORY_TRACT | Qty: 0.2

## 2021-10-19 MED FILL — ASPIRIN LOW DOSE 81 MG PO TBEC: 81 MG | ORAL | Qty: 1

## 2021-10-19 MED FILL — PANTOPRAZOLE SODIUM 40 MG PO TBEC: 40 MG | ORAL | Qty: 1

## 2021-10-19 MED FILL — METOPROLOL SUCCINATE ER 25 MG PO TB24: 25 MG | ORAL | Qty: 1

## 2021-10-19 MED FILL — ATORVASTATIN CALCIUM 40 MG PO TABS: 40 MG | ORAL | Qty: 1

## 2021-10-19 NOTE — Progress Notes (Signed)
Progress Note    Patient: [name] MRN: [id number]  SSN: [id number]    Date of Birth: 1945-09-20  Age: 76 y.o.  Sex: female      Admit Date: 10/18/2021    LOS: 1 day     Subjective:     Patient continues to have diarrhea and C. difficile results are still pending denies any fever or chills her white cell count is still elevated    Objective:     Vitals:    10/19/21 0600 10/19/21 0715 10/19/21 0730 10/19/21 1100   BP:  128/86 131/84 131/84   Pulse:  85 76 76   Resp:   18    Temp:  98.2 F (36.8 C) 97.3 F (36.3 C) 97.3 F (36.3 C)   TempSrc:  Oral Oral Oral   SpO2:  100%  100%   Weight: 41.7 kg (91 lb 14.9 oz)      Height:            Intake and Output:  Current Shift: No intake/output data recorded.  Last three shifts: 09/25 1901 - 09/27 0700  In: 1220.1 [P.O.:120]  Out: 350 [Urine:350]    Physical Exam:   Skin:  Extremities and face reveal no rashes. No palmer erythema.   HEENT: Sclerae anicteric. Extra-occular muscles are intact. No abnormal pigmentation of the lips. The neck is supple.  Cardiovascular: Regular rate and rhythm.  Respiratory:  Comfortable breathing with no accessory muscle use.   GI:  Abdomen nondistended, soft, and nontender. No enlargement of the liver or spleen. No masses palpable.  Rectal:  Deferred  Musculoskeletal: Extremities have good range of motion.  Neurological:  Gross memory appears intact.  Patient is alert and oriented.  Psychiatric:  Mood appears appropriate with judgement intact.  Lymphatic:  No visible adenopathy      Lab/Data Review:  Recent Results (from the past 24 hour(s))   Urinalysis with Reflex to Culture    Collection Time: 10/18/21  1:51 PM    Specimen: Urine   Result Value Ref Range    Color, UA Amber      Appearance Clear Clear      Specific Gravity, UA >1.030 (H) 1.003 - 1.030    pH, Urine 5.0 5.0 - 8.0      Protein, UA 30 (A) Negative mg/dL    Glucose, UA Negative Negative mg/dL    Ketones, Urine 5 (A) Negative mg/dL    Bilirubin Urine Negative Negative       Blood, Urine Small (A) Negative      Urobilinogen, Urine 4.0 (H) 0.1 - 1.0 EU/dL    Nitrite, Urine Negative Negative      Leukocyte Esterase, Urine Moderate (A) Negative      WBC, UA 50-100 0 - 4 /hpf    RBC, UA 0-5 0 - 5 /hpf    Epithelial Cells UA Moderate (A) Few /lpf    BACTERIA, URINE 4+ (A) Negative /hpf    Urine Culture if Indicated Urine Culture Ordered (A) Culture not indicated by UA result      Mucus, UA Trace (A) Negative /lpf    Hyaline Casts, UA 2-5 0 - 5 /lpf   POC Lactic Acid    Collection Time: 10/18/21  2:38 PM   Result Value Ref Range    POC Lactic Acid 2.64 (HH) 0.40 - 2.00 mmol/L    Performed by: Myrtha Mantis    Lactic Acid    Collection Time: 10/18/21  8:36 PM  Result Value Ref Range    Lactic Acid, Plasma 1.8 0.4 - 2.0 mmol/L   Basic Metabolic Panel w/ Reflex to MG    Collection Time: 10/19/21  6:32 AM   Result Value Ref Range    Sodium 143 136 - 145 mmol/L    Potassium 3.8 3.5 - 5.1 mmol/L    Chloride 110 (H) 97 - 108 mmol/L    CO2 25 21 - 32 mmol/L    Anion Gap 8 5 - 15 mmol/L    Glucose 89 65 - 100 mg/dL    BUN 32 (H) 6 - 20 mg/dL    Creatinine 3.66 2.94 - 1.02 mg/dL    Bun/Cre Ratio 38 (H) 12 - 20      Est, Glom Filt Rate >60 >60 ml/min/1.59m2    Calcium 8.8 8.5 - 10.1 mg/dL   Lactic Acid    Collection Time: 10/19/21  6:32 AM   Result Value Ref Range    Lactic Acid, Plasma 0.8 0.4 - 2.0 mmol/L   CBC with Auto Differential    Collection Time: 10/19/21  6:32 AM   Result Value Ref Range    WBC 16.9 (H) 3.6 - 11.0 K/uL    RBC 3.87 3.80 - 5.20 M/uL    Hemoglobin 11.2 (L) 11.5 - 16.0 g/dL    Hematocrit 76.5 46.5 - 47.0 %    MCV 91.7 80.0 - 99.0 FL    MCH 28.9 26.0 - 34.0 PG    MCHC 31.5 30.0 - 36.5 g/dL    RDW 03.5 (H) 46.5 - 14.5 %    Platelets 347 150 - 400 K/uL    MPV 8.9 8.9 - 12.9 FL    Nucleated RBCs 0.0 0.0 PER 100 WBC    nRBC 0.00 0.00 - 0.01 K/uL    Neutrophils % 88 (H) 32 - 75 %    Lymphocytes % 6 (L) 12 - 49 %    Monocytes % 6 5 - 13 %    Eosinophils % 0 0 - 7 %    Basophils  % 0 0 - 1 %    Immature Granulocytes 0 %    Neutrophils Absolute 14.9 (H) 1.8 - 8.0 K/UL    Lymphocytes Absolute 1.0 0.8 - 3.5 K/UL    Monocytes Absolute 1.0 0.0 - 1.0 K/UL    Eosinophils Absolute 0.0 0.0 - 0.4 K/UL    Basophils Absolute 0.0 0.0 - 0.1 K/UL    Absolute Immature Granulocyte 0.0 K/UL    Differential Type Smear Scanned      RBC Comment Normocytic, Normochromic     C-Reactive Protein    Collection Time: 10/19/21  6:32 AM   Result Value Ref Range    CRP 9.01 (H) 0.00 - 0.60 mg/dL              CT ABDOMEN PELVIS W IV CONTRAST Additional Contrast? None   Final Result      1. Long segment wall thickening of the descending and sigmoid colon consistent   with infectious or inflammatory colitis.      2. No free fluid or fluid collections.      3. Chronic appearing fracture of the upper endplate of L2.      XR CHEST PORTABLE   Final Result      No acute process on portable chest.             Assessment:     Principal Problem:    Colitis  Resolved Problems:    *  No resolved hospital problems. *    Infectious enterocolitis stool studies are pending  Plan:   Will add Flagyl 500 mg IV every 8 pending results of stool studies  Thank you for allowing me to participate in this patients care    Signed By: Gwenlyn Fudge, MD     October 19, 2021

## 2021-10-19 NOTE — Care Coordination-Inpatient (Signed)
10/19/21 1414   Service Assessment   Patient Orientation Alert and Oriented   Cognition Alert   History Provided By Patient   Primary Caregiver Self   Accompanied By/Relationship alone   Support Systems Friends/Neighbors   Patient's Healthcare Decision Maker is: Named in [place]   PCP Verified by CM Yes   Last Visit to PCP Within last 3 months   Prior Functional Level Assistance with the following:;Mobility   Current Functional Level Assistance with the following:;Dressing;Toileting;Mobility;Housework   Can patient return to prior living arrangement Unknown at present   Ability to make needs known: Fair   Family able to assist with home care needs: No   Would you like for me to discuss the discharge plan with any other family members/significant others, and if so, who? Yes   Financial Resources [employer] Resources None   Social/Functional History   Lives With [place], [place] Help From Friend(s)   Active Driver Yes     Lives at address on file, alone. States she has wheel chair and walker. States she still drives.    Has been to rehab before, per [facility]. Patient open to [facility] back [facility] if needed. Recommended by PT. Referral sent via [facility].    Current dc plan is SNF at [facility]

## 2021-10-19 NOTE — Progress Notes (Signed)
Upon cleaning up pt from having BM, redness noted on buttock and perineum. Zinc paste applied and wound care consult placed for futher assessment and orders needed for pt. Pt condition stable at this time.

## 2021-10-19 NOTE — Progress Notes (Signed)
Ultrasound-guided (USG) PIV placement: see Epic Avatar and EHR flowsheet date for additional vascular access device and procedural information.     20 g 1.75 inch PIV placed with ultrasound-guidance x 1 attempt in right AC vein. Brisk blood return noted, flushed easily with 10 mL NS. Dressed with large 2M dressing. Needle-free connector and alcohol swab end caps utilized.     Client tolerated well, no patient complaints.     Client continues to benefit from ultrasound-guided PIV placement due to limited suitable veins for USG cannulation, vein depth, and small vein diameters.     Primary care nurse notified.     Please re-enter IP PICC Team Consult in Epic for any further vascular access needs.     Francesca Jewett, RN

## 2021-10-19 NOTE — Progress Notes (Signed)
Hospitalist Progress Note    NAME:   [name]   DOB: [birth year]   MRN: [id number]     Date/Time: 10/19/2021 12:15 PM  Patient PCP: Rigoberto Noel, APRN - NP    Estimated discharge date: 24 to 48 hours  Barriers: IV antibiotics, PT/OT and C. difficile/enteric bacterial panel    Hospital course:  Patient is a 76 year old female with past medical history of COPD, HLD, HTN and chronic L2 fracture who was admitted on 10/18/2021 with generalized weakness and multiple episodes of diarrhea.  CT of the abdomen pelvis revealed long segment of wall thickening of descending and sigmoid colon consistent with infectious or inflammatory colitis.  No free fluid or fluid collection.  GI consulted recommended stool culture collection.  UA admission revealed moderate leukoesterase with 4+ bacteria and 50-100 WBC.  Patient initiated on Zosyn.  Urine and blood cultures pending.    Assessment / Plan:  Acute colitis  -CT abdomen pelvis revealed long segment of wall thickening of descending and sigmoid colon consistent with infectious or inflammatory colitis.  No free fluid or fluid collection  -WBC 21.7>16.9  -Lactic 2.4>0.8, without ischemia  -CRP 9.01  -Pro-Cal pending  -Blood cultures pending  -N.p.o. this time  -IV fluids  -C. difficile and enteric bacterial panel pending  -GI consulted and recommended stool cultures    UTI  -UA revealed moderate leukoesterase with 4+ bacteria and 50-100 WBC  -WBC 21.7>16.9  -Urine cultures pending  -Continue IV Zosyn    AKI likely secondary to dehydration  -Creatinine 1.79> 0.85  -Continue IV fluids    Generalized weakness/failure to thrive; patient lives at home alone  -PT OT consults    Chronic L2 fracture  -CT of the abdomen revealed chronic appearing fracture of the upper endplate of L2  -Continue to monitor and consider MRI if necessary    History of COPD without supplemental oxygen needed at home  -Continue home medication    History of HTN  -Continue medication    Medical Decision Making:    I personally reviewed labs: CBC CMP lactic CRP UA  I personally reviewed imaging: CT of the abdomen pelvis and CXR  Toxic drug monitoring: Adverse reaction antibiotic, heparin for any signs bleeding, monitor PTT  Discussed case with: Patient        Code Status: DNR  DVT Prophylaxis: Heparin  GI Prophylaxis: Protonix    Subjective:     Chief Complaint / Reason for Physician Visit  Patient evaluated bedside and reports improvement overall status as of this morning.  Will collect stool cultures after patient's next bowel movement.  She denies any chest pain, short of breath, nausea, vomiting, abdominal pain or any other symptoms this time discussed with RN events overnight.       Objective:     VITALS:   Last 24hrs VS reviewed since prior progress note. Most recent are:  Patient Vitals for the past 24 hrs:   BP Temp Temp src Pulse Resp SpO2 Weight   10/19/21 1100 131/84 97.3 F (36.3 C) Oral 76 -- 100 % --   10/19/21 0715 128/86 98.2 F (36.8 C) Oral 85 -- 100 % --   10/19/21 0600 -- -- -- -- -- -- 41.7 kg (91 lb 14.9 oz)   10/19/21 0306 (!) 150/87 98.1 F (36.7 C) Oral 92 18 98 % --   10/18/21 2104 (!) 156/83 -- -- 96 -- -- --   10/18/21 2103 -- -- -- -- -- 96 % --  10/18/21 2027 (!) 156/83 99.1 F (37.3 C) Oral 96 17 100 % --   10/18/21 1900 (!) 161/89 -- -- 99 20 95 % --   10/18/21 1830 (!) 153/96 -- -- 100 24 -- --   10/18/21 1807 (!) 160/91 97.9 F (36.6 C) Oral (!) 101 23 95 % --   10/18/21 1500 (!) 156/103 -- -- 100 20 -- --   10/18/21 1445 (!) 157/134 -- -- (!) 105 26 -- --   10/18/21 1430 (!) 169/125 -- -- (!) 103 25 94 % --   10/18/21 1400 (!) 167/100 -- -- (!) 103 18 -- --   10/18/21 1345 -- -- -- -- -- 96 % --   10/18/21 1330 (!) 162/96 -- -- (!) 101 22 -- --         Intake/Output Summary (Last 24 hours) at 10/19/2021 1215  Last data filed at 10/19/2021 0600  Gross per 24 hour   Intake 1220.07 ml   Output 350 ml   Net 870.07 ml        I had a face to face encounter and independently examined this  patient on 10/19/2021, as outlined below:    Review of Systems   Constitutional:  Positive for fatigue. Negative for activity change, appetite change, chills and fever.   HENT:  Negative for congestion, rhinorrhea, sore throat and trouble swallowing.    Eyes: Negative.    Respiratory:  Negative for cough, shortness of breath and wheezing.    Cardiovascular:  Negative for chest pain, palpitations and leg swelling.   Gastrointestinal:  Positive for diarrhea. Negative for abdominal pain, nausea and vomiting.   Endocrine: Negative.    Genitourinary:  Negative for dysuria and hematuria.   Musculoskeletal:  Negative for back pain, neck pain and neck stiffness.   Skin: Negative.    Allergic/Immunologic: Negative.    Neurological:  Positive for weakness. Negative for dizziness, numbness and headaches.   Hematological: Negative.    Psychiatric/Behavioral: Negative.     All other systems reviewed and are negative.       PHYSICAL EXAM:  Physical Exam  Vitals reviewed.   Constitutional:       General: She is not in acute distress.     Appearance: She is not ill-appearing.      Comments: Thin build   HENT:      Head: Normocephalic.   Eyes:      Extraocular Movements: Extraocular movements intact.   Cardiovascular:      Rate and Rhythm: Normal rate and regular rhythm.      Heart sounds: Normal heart sounds.   Pulmonary:      Effort: Pulmonary effort is normal.      Breath sounds: Normal breath sounds.   Abdominal:      General: Abdomen is flat.      Palpations: Abdomen is soft.      Tenderness: There is no abdominal tenderness.   Musculoskeletal:         General: Tenderness present. Normal range of motion.      Cervical back: Normal range of motion. No tenderness.      Right lower leg: No edema.      Left lower leg: No edema.   Neurological:      Mental Status: She is alert and oriented to person, place, and time.          Reviewed most current lab test results and cultures  YES  Reviewed most current radiology test results  YES  Review and summation of old records today    NO  Reviewed patient's current orders and MAR    YES  PMH/SH reviewed - no change compared to H&P  ________________________________________________________________________  Care Plan discussed with:    Comments   Patient x    Family      RN x    Care Manager     Consultant                        Multidiciplinary team rounds were held today with case manager, nursing, pharmacist and Higher education careers adviser.  Patient's plan of care was discussed; medications were reviewed and discharge planning was addressed.     ________________________________________________________________________  Total NON critical care TIME:  35  Minutes    Total CRITICAL CARE TIME Spent:   Minutes non procedure based      Comments   >50% of visit spent in counseling and coordination of care     ________________________________________________________________________  Chales Salmon, PA-C     Procedures: see electronic medical records for all procedures/Xrays and details which were not copied into this note but were reviewed prior to creation of Plan.      LABS:  I reviewed today's most current labs and imaging studies.  Pertinent labs include:  Recent Labs     10/18/21  1122 10/19/21  0632   WBC 21.7* 16.9*   HGB 13.9 11.2*   HCT 43.7 35.5   PLT 524* 347     Recent Labs     10/18/21  1122 10/19/21  0632   NA 144 143   K Hemolyzed, Recollection Recommended 3.8   CL 109* 110*   CO2 24 25   BUN 42* 32*   ALT 16  --        Signed: Chales Salmon, PA-C

## 2021-10-19 NOTE — Plan of Care (Signed)
PHYSICAL THERAPY EVALUATION  Patient: [name] (76 y.o. female)  Date: 10/19/2021  Primary Diagnosis: Infectious colitis [A09]  Colitis [K52.9]       Precautions: Fall Risk, General Precautions                Recommendations for nursing mobility: Encourage HEP in prep for ADLs/mobility; see handout for details, Frequent repositioning to prevent skin breakdown, Use of bed/chair alarm for safety, Use of BSC for toileting , and AD and gt belt for bed to chair     In place during session: Peripheral IV, External Catheter, and EKG/telemetry   ASSESSMENT  Pt is a 76 y.o. female admitted on 10/18/2021 for abd pain; pt currently being treated for infectious colitis . Pt semi supine  upon PT arrival, agreeable to evaluation. Pt A&O x 4.    Patient reports she lives alone at home and indep,friends helps her at times.Patient reports multiple falls and she reports she just gets dizzy.Patient had left hip Fx last year and fall again and had to get another Sx.  Patient very anxious.heavy posterior lean.Additional time for all mobility O Positive had c-diff ?? But at this time she was constipated and wanted to use the bathroom.Transferred to [facility] with max assist.Patient unable to pass stool in sitting so used her hands to take put the stool.She had stool all over her fingers.Patient than stood to clean but at this time she was still going.Took extended time to clean and go to bathroom.Used about 3 packs of wipe with hard stool coming out.She c/o anal pain .After satisfactory cleaning PT was going to transfer her to recliner but she needed to go again so Patient was left with student nurse and CNA was informed to assist patient to bed/recliner.Patient required max assist to stand as she was heavily leaning posteriorly .  Based on the objective data described below, the patient currently presents with impaired functional mobility, impaired ability to perform high-level IADLs, decreased ROM, impaired strength, poor body mechanics,  decreased activity tolerance, poor safety awareness, impaired cognition, impaired balance, and impaired posture. (See below for objective details and assist levels).     Overall pt tolerated session fair today with PT. Pt will benefit from continued skilled PT to address above deficits and return to PLOF. Potential barriers for safe discharge: . Current PT DC recommendation [place] once medically appropriate.      GOALS:    Problem: Physical Therapy - Adult  Goal: By Discharge: Performs mobility at highest level of function for planned discharge setting.  See evaluation for individualized goals.  Description: FUNCTIONAL STATUS PRIOR TO ADMISSION: Patient was modified independent using a single point cane for functional mobility.    HOME SUPPORT PRIOR TO ADMISSION: The patient lived alone with friends to provide assistance.    Physical Therapy Goals  Initiated 10/19/2021  Pt stated goal: get better  Pt will be I with LE HEP in 7 days.  Pt will perform bed mobility with Minimal Assist in 7 days.  Pt will perform transfers with Minimal Assist in 7 days.   Pt will amb 40 feet with LRAD safely with Minimal Assist in 7 days.    Pt will verbalize and demonstrate compliance with fall precautions   in 7 days.   Pt will demonstrate improvement in standing  balance from poor to fair in 7 days.    10/19/2021 1445 by Alonna Buckler, PT  Outcome: Progressing        PLAN :  Recommendations and Planned Interventions: bed mobility training, transfer training, gait training, therapeutic exercises, patient and family training/education, and therapeutic activities    Frequency/Duration: Patient will be followed by physical therapy:  3-5x/week to address goals.    Recommendation for discharge: (in order for the patient to meet his/her long term goals)  Skilled Nursing Facility    IF patient discharges home will need the following DME: rolling walker and wheelchair        SUBJECTIVE:   Patient stated "I am ok."    OBJECTIVE  DATA SUMMARY:   HISTORY:    Past Medical History:   Diagnosis Date    Chronic obstructive pulmonary disease (HCC)     Hypercholesteremia     Insomnia     Sleep disorder      Past Surgical History:   Procedure Laterality Date    ORTHOPEDIC SURGERY  05/26/2021    LEFT HIP REVISION POSTERIOR APPROACH WITH ALLOGRAFT    PARTIAL HYSTERECTOMY (CERVIX NOT REMOVED)      SHOULDER SURGERY Left        Home Situation:  Social/Functional History  Lives With: Alone  Type of Home: House  Home Layout: Two level, Able to Live on Main level with bedroom/bathroom  Home Access: Stairs to enter with rails  Entrance Stairs - Number of Steps: 3  Home Equipment: Gilmer Mor, Rollator (BSC)  Receives Help From: Friend(s)  Active Driver: Yes    Cognitive/Behavioral Status:  Orientation  Orientation Level: Oriented to place;Oriented to person       Skin:     Edema:     Strength:    Strength: Generally decreased, functional    Range Of Motion:  AROM: Generally decreased, functional       Coordination:        Tone & Sensation:   Tone: Normal         Functional Mobility:  Bed Mobility:     Bed Mobility Training  Bed Mobility Training: Yes  Rolling: Minimum assistance;Moderate assistance  Supine to Sit: Moderate assistance  Sit to Supine: Moderate assistance  Scooting: Moderate assistance  Transfers:     Art therapist: Yes  Sit to Stand: Maximum assistance;Assist X1  Stand to Sit: Maximum assistance;Assist X1  Toilet Transfer: Maximum assistance;Assist X1  Balance:               Balance  Sitting: Impaired  Sitting - Static: Fair (occasional)  Sitting - Dynamic: Fair (occasional)  Standing: Impaired  Standing - Static: Fair;Poor;Constant support  Standing - Dynamic: Constant support;Poor  Wheelchair Mobility:        Ambulation/Gait Training:                       Gait  Overall Level of Assistance: Maximum assistance;Assist X1  Interventions: Demonstration;Verbal cues;Manual cues;Safety awareness training;Weight shifting  training/pressure relief  Base of Support: Narrowed  Distance (ft): 4 Feet  Assistive Device: Gait belt;Walker, rolling                   Therapeutic Exercises:   AP, LAQ reviewed with patient    Functional Measure:  [employer] AM-PACT "6 Clicks"         Basic Mobility Inpatient Short Form  How much difficulty does the patient currently have... Unable A Lot A Little None   1.  Turning over in bed (including adjusting bedclothes, sheets and blankets)?   []  1   [x]  2   []   3   []  4   2.  Sitting down on and standing up from a chair with arms ( e.g., wheelchair, bedside commode, etc.)   []  1   [x]  2   []  3   []  4   3.  Moving from lying on back to sitting on the side of the bed?   []  1   [x]  2   []  3   []  4          How much help from another person does the patient currently need... Total A Lot A Little None   4.  Moving to and from a bed to a chair (including a wheelchair)?   []  1   [x]  2   []  3   []  4   5.  Need to walk in hospital room?   [x]  1   []  2   []  3   []  4   6.  Climbing 3-5 steps with a railing?   [x]  1   []  2   []  3   []  4    2007, Trustees of , under license to [employer], [place]. All rights reserved     Score:  Initial: 10/24 Most Recent: X (Date: 10/19/2021 )   Interpretation of Tool:  Represents activities that are increasingly more difficult (i.e. Bed mobility, Transfers, Gait).  Score 24 23 22-20 19-15 14-10 9-7 6   Modifier CH CI CJ CK CL CM CN         Physical Therapy Evaluation Charge Determination   History Examination Presentation Decision-Making   LOW Complexity : Zero comorbidities / personal factors that will impact the outcome / POC LOW Complexity : 1-2 Standardized tests and measures addressing body structure, function, activity limitation and / or participation in recreation  LOW Complexity : Stable, uncomplicated  Other outcome measures ampac 6  LOW      Based on the above components, the patient evaluation is determined to be of the following complexity level:  LOW    Pain Rating:  4/10 LLE  Pain Intervention(s):       Activity Tolerance:   Fair     After treatment patient left in no apparent distress:   Bed locked and in lowest position Call bell within reach, Caregiver / family present, and left on [facility]  and nsg updated.    COMMUNICATION/EDUCATION:   The patient's plan of care was discussed with: Occupational therapist and Registered nurse    Patient Education  Education Given To: Patient  Education Provided: Role of Therapy;Plan of Care;Fall Prevention Strategies;Transfer Training;Precautions  Education Method: Demonstration;Verbal;Teach Back  Education Outcome: Verbalized understanding;Continued education needed;Unable to demonstrate understanding         Thank you for this referral.  , PT  Minutes: 38

## 2021-10-19 NOTE — Progress Notes (Signed)
4 Eyes Skin Assessment     NAME:  [name]  DATE OF BIRTH:  06/18/1945  MEDICAL RECORD NUMBER:  [id number]    The patient is being assessed for  Shift Handoff    I agree that at least one RN has performed a thorough Head to Toe Skin Assessment on the patient. ALL assessment sites listed below have been assessed.      Areas assessed by both nurses:    Head, Face, Ears, Shoulders, Back, Chest, Arms, Elbows, Hands, Sacrum. Buttock, Coccyx, Ischium, Legs. Feet and Heels, and Under Medical Devices         Does the Patient have a Wound? Yes wound(s) were present on assessment. LDA wound assessment was Initiated and completed by RN       Braden Prevention initiated by RN: Yes  Wound Care Orders initiated by RN: Yes    Pressure Injury (Stage 3,4, Unstageable, DTI, NWPT, and Complex wounds) if present, place Wound referral order by RN under ORDER ENTRY: No    New Ostomies, if present place, Ostomy referral order under ORDER ENTRY: No     Nurse 1 eSignature: Electronically signed by Duanne Limerick, RN on 10/19/21 at 4:05 PM EDT    **SHARE this note so that the co-signing nurse can place an eSignature**    Nurse 2 eSignature: Electronically signed by Janalyn Rouse, RN on 10/19/21 at 4:19 PM EDT

## 2021-10-19 NOTE — Plan of Care (Signed)
Problem: Discharge Planning  Goal: Discharge to home or other facility with appropriate resources  Outcome: Progressing     Problem: Safety - Adult  Goal: Free from fall injury  Outcome: Progressing        Problem: Skin/Tissue Integrity  Goal: Absence of new skin breakdown  Description: 1.  Monitor for areas of redness and/or skin breakdown  2.  Assess vascular access sites hourly  3.  Every 4-6 hours minimum:  Change oxygen saturation probe site  4.  Every 4-6 hours:  If on nasal continuous positive airway pressure, respiratory therapy assess nares and determine need for appliance change or resting period.  Outcome: Not Progressing     Problem: Pain  Goal: Verbalizes/displays adequate comfort level or baseline comfort level  Outcome: Not Progressing

## 2021-10-19 NOTE — Plan of Care (Signed)
Problem: Skin/Tissue Integrity  Goal: Absence of new skin breakdown  Description: 1.  Monitor for areas of redness and/or skin breakdown  2.  Assess vascular access sites hourly  3.  Every 4-6 hours minimum:  Change oxygen saturation probe site  4.  Every 4-6 hours:  If on nasal continuous positive airway pressure, respiratory therapy assess nares and determine need for appliance change or resting period.  Outcome: Progressing     Problem: Safety - Adult  Goal: Free from fall injury  Outcome: Progressing

## 2021-10-20 LAB — CBC WITH AUTO DIFFERENTIAL
Absolute Immature Granulocyte: 0.1 10*3/uL — ABNORMAL HIGH (ref 0.00–0.04)
Basophils %: 0 % (ref 0–1)
Basophils Absolute: 0 10*3/uL (ref 0.0–0.1)
Eosinophils %: 4 % (ref 0–7)
Eosinophils Absolute: 0.4 10*3/uL (ref 0.0–0.4)
Hematocrit: 31.3 % — ABNORMAL LOW (ref 35.0–47.0)
Hemoglobin: 9.8 g/dL — ABNORMAL LOW (ref 11.5–16.0)
Immature Granulocytes: 0 % (ref 0–0.5)
Lymphocytes %: 21 % (ref 12–49)
Lymphocytes Absolute: 2.4 10*3/uL (ref 0.8–3.5)
MCH: 29.4 PG (ref 26.0–34.0)
MCHC: 31.3 g/dL (ref 30.0–36.5)
MCV: 94 FL (ref 80.0–99.0)
MPV: 8.8 FL — ABNORMAL LOW (ref 8.9–12.9)
Monocytes %: 6 % (ref 5–13)
Monocytes Absolute: 0.6 10*3/uL (ref 0.0–1.0)
Neutrophils %: 69 % (ref 32–75)
Neutrophils Absolute: 8 10*3/uL (ref 1.8–8.0)
Nucleated RBCs: 0 PER 100 WBC
Platelets: 275 10*3/uL (ref 150–400)
RBC: 3.33 M/uL — ABNORMAL LOW (ref 3.80–5.20)
RDW: 15.6 % — ABNORMAL HIGH (ref 11.5–14.5)
WBC: 11.5 10*3/uL — ABNORMAL HIGH (ref 3.6–11.0)
nRBC: 0 10*3/uL (ref 0.00–0.01)

## 2021-10-20 LAB — BASIC METABOLIC PANEL
Anion Gap: 6 mmol/L (ref 5–15)
BUN: 25 mg/dL — ABNORMAL HIGH (ref 6–20)
Bun/Cre Ratio: 35 — ABNORMAL HIGH (ref 12–20)
CO2: 22 mmol/L (ref 21–32)
Calcium: 7.6 mg/dL — ABNORMAL LOW (ref 8.5–10.1)
Chloride: 114 mmol/L — ABNORMAL HIGH (ref 97–108)
Creatinine: 0.72 mg/dL (ref 0.55–1.02)
Est, Glom Filt Rate: >60 mL/min/{1.73_m2} (ref >60)
Glucose: 81 mg/dL (ref 65–100)
Potassium: 3.5 mmol/L (ref 3.5–5.1)
Sodium: 142 mmol/L (ref 136–145)

## 2021-10-20 LAB — EKG 12-LEAD
Atrial Rate: 113 {beats}/min
P Axis: 64 degrees
P-R Interval: 178 ms
Q-T Interval: 326 ms
QRS Duration: 42 ms
QTc Calculation (Bazett): 447 ms
R Axis: 15 degrees
T Axis: 39 degrees
Ventricular Rate: 113 {beats}/min

## 2021-10-20 LAB — C-REACTIVE PROTEIN: CRP: 5.3 mg/dL — ABNORMAL HIGH (ref 0.00–0.60)

## 2021-10-20 MED ORDER — PETROLATUM-ZINC OXIDE 57-17 % EX PSTE
57-17 % | Freq: Two times a day (BID) | CUTANEOUS | Status: DC
Start: 2021-10-20 — End: 2021-10-21
  Administered 2021-10-20 – 2021-10-21 (×3): via TOPICAL

## 2021-10-20 MED FILL — PIPERACILLIN SOD-TAZOBACTAM SO 3.375 (3-0.375) G IV SOLR: 3.375 (3-0.375) g | INTRAVENOUS | Qty: 3375

## 2021-10-20 MED FILL — TYLENOL 325 MG PO TABS: 325 MG | ORAL | Qty: 2

## 2021-10-20 MED FILL — METRONIDAZOLE 500 MG PO TABS: 500 MG | ORAL | Qty: 1

## 2021-10-20 MED FILL — PANTOPRAZOLE SODIUM 40 MG PO TBEC: 40 MG | ORAL | Qty: 1

## 2021-10-20 MED FILL — ASPIRIN LOW DOSE 81 MG PO TBEC: 81 MG | ORAL | Qty: 1

## 2021-10-20 MED FILL — HEPARIN SODIUM (PORCINE) 5000 UNIT/ML IJ SOLN: 5000 UNIT/ML | INTRAMUSCULAR | Qty: 1

## 2021-10-20 MED FILL — ATORVASTATIN CALCIUM 40 MG PO TABS: 40 MG | ORAL | Qty: 1

## 2021-10-20 MED FILL — PHYTOPLEX Z-GUARD 57-17 % EX PSTE: 57-17 % | CUTANEOUS | Qty: 113

## 2021-10-20 MED FILL — METOPROLOL SUCCINATE ER 25 MG PO TB24: 25 MG | ORAL | Qty: 1

## 2021-10-20 NOTE — Care Coordination-Inpatient (Signed)
CM sent updated notes. Requested for auth to be started at [facility].    Confirmed with liaison Josem Kaufmann has been started at [facility].

## 2021-10-20 NOTE — Progress Notes (Addendum)
Comprehensive Nutrition Assessment    Type and Reason for Visit:  Initial, Positive Nutrition Screen (MST 2)    Nutrition Recommendations/Plan:   Full Liquid diet, consider advancing as medically appropriate to Low Fiber.  Ensure HP x2/d.  Continue to monitor and document PO and ONS intakes in I/Os.     Malnutrition Assessment:  Malnutrition Status:  Mild malnutrition (10/20/21 1307)    Context:  Acute Illness     Findings of the 6 clinical characteristics of malnutrition:  Energy Intake:  No significant decrease in energy intake  Weight Loss:  Greater than 7.5% over 3 months     Body Fat Loss:  Unable to assess     Muscle Mass Loss:  Unable to assess    Fluid Accumulation:  No significant fluid accumulation    Grip Strength:  Not Performed    Nutrition Assessment:    Admitted for colitis. Screened for MST 2 d/t noted weight loss and poor intakes and low BMI. Pt endorsed good appetite, w/ 100% of Full Liq tray. RD provided additional snack and Ensure shake given report of continued hunger. Pt endorsed baseline of 1 ONS/d. Consider advancing diet to Low Fiber for therapeutic management and add ONS. UBW=113# 4 mths ago per EMR- severe weight loss(18.6%) indicated; will trend for weight loss. Labs: H/H 9.8/31.3, Cl 114, BUN 25, Ca 7.6. Meds: NS, PPI, zosyn, lopressor, flagyl, lipitor, heparin, aspirin.    Nutrition Related Findings:    NFPE declined, still hungry wanting to eat; observed small frame. No N/V/D/C nor chewing/swallowing difficulties reported. Last BM 9/27- watery. No edema. Wound Type: Surgical Incision (L. Hip)       Current Nutrition Intake & Therapies:    Average Meal Intake: 76-100%  Average Supplements Intake: 76-100%  ADULT DIET; Full Liquid    Anthropometric Measures:  Height: 160 cm (5\' 3" )  Ideal Body Weight (IBW): 115 lbs (52 kg)    Admission Body Weight: 91 lb 14.9 oz (41.7 kg)  Current Body Weight: 91 lb 14.9 oz (41.7 kg) (9/28), 79.9 % IBW. Weight Source: Bed Scale  Current BMI (kg/m2):  16.3  Usual Body Weight: 113 lb (51.3 kg)  % Weight Change (Calculated): -18.6  Weight Adjustment For: No Adjustment  BMI Categories: Underweight (BMI less than 22) age over 74    Estimated Daily Nutrient Needs:  Energy Requirements Based On: Kcal/kg  Weight Used for Energy Requirements: Current  Energy (kcal/day): 1260-1470 kcal/d (30-35 kcal/kg)  Weight Used for Protein Requirements: Current  Protein (g/day): 50 gm/d  Method Used for Fluid Requirements: 1 ml/kcal  Fluid (ml/day): 1260-1470 mL/d    Nutrition Diagnosis:   Underweight related to altered GI function as evidenced by weight loss 7.5% in 3 months, diarrhea    Nutrition Interventions:   Food and/or Nutrient Delivery: Continue Current Diet, Start Oral Nutrition Supplement  Nutrition Education/Counseling: No recommendation at this time  Coordination of Nutrition Care: Continue to monitor while inpatient  Plan of Care discussed with: RN, Pt    Goals:  Goals: Meet at least 75% of estimated needs, PO intake 75% or greater, within 7 days, other (specify)  Specify Other Goals: Wt gain of 1-2 lbs/week.    Nutrition Monitoring and Evaluation:   Behavioral-Environmental Outcomes: None Identified  Food/Nutrient Intake Outcomes: Diet Advancement/Tolerance, Food and Nutrient Intake, Supplement Intake  Physical Signs/Symptoms Outcomes: Biochemical Data, GI Status, Meal Time Behavior, Weight    Discharge Planning:    No discharge needs at this time  Terrall Laity, RD  Contact: ext. [phone].

## 2021-10-20 NOTE — Progress Notes (Signed)
OCCUPATIONAL THERAPY EVALUATION  Patient: [name] (76 y.o. female)  Date: 10/20/2021  Primary Diagnosis: Infectious colitis [A09]  Colitis [K52.9]       Precautions: Fall Risk, General Precautions                Recommendations for nursing mobility: AD and gt belt for bed to chair  and Amb to bathroom with AD and gait belt    In place during session:Peripheral IV and EKG/telemetry   ASSESSMENT  Pt is a 76 y.o. female presenting to [facility] with c/o diarrhea, weakness, and intermittent dyspnea on exertion, admitted 9/26 and currently being treated for colitis. Pt received semi-supine in bed upon arrival, AXO x2 (cues for time), and agreeable to OT evaluation. Pt eagar to participate w/ therapy.     Based on current observations, pt presents with decreased  functional mobility, independence in ADLs, ROM, strength, sensation, activity tolerance, endurance, balance (see below for objective details and assist levels).     Overall, pt tolerates session fair w/out c/o or SOB. Functional mobility limited this date d/t elevated HR; pt reports feeling fast heart beat. HR elevated to 160 bpm w/ mobility and returned to sitting position to allow HR to recover to 90 bpm. Pt req'd cues to slow down during transfers and ambulation to improve safety; cues for hand placement. She req'd CGA for all functional mobility using RW; increased physical support for taking steps backward to reduce falls risk. Pt will benefit from continued skilled OT services to address current impairments and improve IND and safety with self cares and functional transfers/mobility. Potential barriers for safe discharge: pt lives alone and pt is a high fall risk. Current OT d/c recommendation Skilled [place] medically appropriate.     Start of Session End of Session   SPO2 (%) 96 97   Heart Rate (BPM) 81 80     GOALS:    Problem: Occupational Therapy - Adult  Goal: By Discharge: Performs self-care activities at highest level of function for  planned discharge setting.  See evaluation for individualized goals.  Description: FUNCTIONAL STATUS PRIOR TO ADMISSION:  Pt reports she was using RW for ambulation and IND w/ ADLs prior to admission. She reports at least 3 falls in the last 3 months.    HOME SUPPORT: Pt lives alone and has some A her every once in a while.     Occupational Therapy Goals:  Initiated 10/20/2021  Patient/Family stated goal: I want to get better   Patient will perform grooming in standing with Modified [place] within 7 day(s).  Patient will perform UB bathing with Independence within 7 day(s).  Patient will perform LB bathing with Independence within 7 day(s).  Patient will perform toilet transfers with Modified [place]  within 7 day(s).  Patient will perform all aspects of toileting with Independence within 7 day(s).  Patient will participate in upper extremity therapeutic exercise/activities with Independence within 7 day(s).    Outcome: Progressing        PLAN :  Recommendations and Planned Interventions: self care training, therapeutic activities, functional mobility training, balance training, therapeutic exercise, endurance activities, patient education, and home safety training    Recommend next OT session: LB dressing, LB bathing, and standing grooming    Frequency/Duration: Patient will be followed by occupational therapy:  3-5x/week to address goals.    Recommendation for discharge: (in order for the patient to meet his/her long term goals)  [place]    IF patient discharges home  will need the following DME: TBD        SUBJECTIVE:   Patient stated "I am feeling better."    OBJECTIVE DATA SUMMARY:     Past Medical History:   Diagnosis Date    Chronic obstructive pulmonary disease ([place])     Hypercholesteremia     Insomnia     Sleep disorder      Past Surgical History:   Procedure Laterality Date    ORTHOPEDIC SURGERY  05/26/2021    LEFT HIP REVISION POSTERIOR APPROACH WITH ALLOGRAFT    PARTIAL  HYSTERECTOMY (CERVIX NOT REMOVED)      SHOULDER SURGERY Left           Expanded or extensive additional review of patient history:   Lives With: Alone  Type of Home: House  Home Layout: Two level, Able to Live on Main level with bedroom/bathroom  Home Access: Stairs to enter with rails                    Home Equipment: Cane, Rollator (BSC)  Has the patient had two or more falls in the past year or any fall with injury in the past year?:  (3 falls)  Social/Functional History  Lives With: Alone  Type of Home: House  Home Layout: Two level, Able to Live on Main level with bedroom/bathroom  Home Access: Stairs to enter with rails  Entrance Stairs - Number of Steps: 3  Home Equipment: Cane, Rollator (BSC)  Has the patient had two or more falls in the past year or any fall with injury in the past year?:  (3 falls)  Receives Help From: Friend(s)  ADL Assistance: Independent  Ambulation Assistance: Independent  Active Driver: Yes      EXAMINATION OF PERFORMANCE DEFICITS:    Cognitive/Behavioral Status:  Orientation  Orientation Level: Oriented to situation;Oriented to person;Oriented to place;Disoriented to time       Hearing:   Hearing  Hearing: Within functional limits      Range of Motion:   AROM: Within functional limits       Strength:  Strength: Generally decreased, functional      Tone & Sensation:      Sensation: Intact      Functional Mobility and Transfers for ADLs:  Bed Mobility:  Bed Mobility Training  Bed Mobility Training: Yes  Supine to Sit: Contact-guard assistance  Sit to Supine: Contact-guard assistance  Scooting: Contact-guard assistance    Transfers:  Pharmacologist: Yes  Interventions: Safety awareness training  Sit to Stand: Contact-guard assistance;Additional time  Stand to Sit: Contact-guard assistance;Additional time  Toilet Transfer: Contact-guard assistance;Assist X1;Additional time      Balance:  Balance  Sitting: Intact  Standing: Impaired  Standing - Static: Fair;Constant  support  Standing - Dynamic: Fair;Constant support      ADL Assessment:          LE Dressing: Moderate assistance  LE Dressing Skilled Clinical Factors: able to adjust R sock but difficulty w/ L sock    Toileting: Independent  Toileting Skilled Clinical Factors: on commode for anterior pericare               Functional Measure:    KB Home	[place] AM-PACTM "6 Clicks"  Daily Activity Inpatient Short Form  How much help from another person does the patient currently need... Total; A Lot A Little None   1.  Putting on and taking off regular lower body clothing? []   1 [x]   2 []   3 []   4   2.  Bathing (including washing, rinsing, drying)? []   1 []   2 [x]   3 []   4   3.  Toileting, which includes using toilet, bedpan or urinal? []  1 []   2 [x]   3 []   4   4.  Putting on and taking off regular upper body clothing? []   1 []   2 [x]   3 []   4   5.  Taking care of personal grooming such as brushing teeth? []   1 []   2 [x]   3 []   4   6.  Eating meals? []   1 []   2 [x]   3 []   4    2007, [employer], under license to [place]. All rights reserved     Score: 17/24     Interpretation of Tool:  Represents clinically-significant functional categories (i.e. Activities of daily living).  Percentage of Impairment CH    0%   CI    1-19% CJ    20-39% CK    40-59% CL    60-79% CM    80-99% CN     100%   AMPAC  Score 6-24 24 23  20-22 15-19 10-14 7-9 6     Occupational Therapy Evaluation Charge Determination   History Examination Decision-Making   LOW Complexity : Brief history review  LOW Complexity: 1-3 Performance deficits relating to physical, cognitive, or psychosocial skills that result in activity limitations and/or participation restrictions MEDIUM Complexity: Patient may present with comorbidities that affect occupational performance. Minimal to moderate modifications of tasks or assist (eg. physical or verbal) with assist is necessary to enable pt to complete  eval      Based on the above components, the patient evaluation is determined to be of the following complexity level: Low    Pain Rating:  0/10     Activity Tolerance:   Fair  and requires rest breaks    After treatment patient left in no apparent distress:    Patient left in no apparent distress sitting up in chair, Call bell within reach, and PTA in room , bed locked and in lowest position    COMMUNICATION/EDUCATION:   The patient's plan of care was discussed with: Physical therapy assistant and Registered nurse    Patient Education  Education Given To: Patient  Education Provided: Role of Therapy;Plan of Care  Education Method: Demonstration;Verbal  Education Outcome: Verbalized understanding;Demonstrated understanding    The supervising occupational therapist and treating occupational therapist assistant have met to review this patient's progress and plan of care.    This patient's plan of care is appropriate for delegation to OTA.     PTA/OT sessions overlapped this date  for increased safety; pt w/ significant improvement from previous session and now can be x1.     Thank you for this referral,  Dewain Penning, OT  Minutes: 27

## 2021-10-20 NOTE — Progress Notes (Signed)
Progress Note    Patient: [name] MRN: [id number]  SSN: [id number]    Date of Birth: 1945-04-30  Age: 76 y.o.  Sex: female      Admit Date: 10/18/2021    LOS: 2 days     Subjective:     Patient is feeling somewhat better she had a large solid bowel movement yesterday.  Her C. difficile is negative.  Her white cell count is coming down    Objective:     Vitals:    10/20/21 0757 10/20/21 1053 10/20/21 1308 10/20/21 1435   BP: 131/77   105/69   Pulse: 66   65   Resp:    14   Temp: 97.9 F (36.6 C)   98.2 F (36.8 C)   TempSrc: Oral   Oral   SpO2: 100% 99%  97%   Weight:       Height:   1.6 m (5\' 3" )         Intake and Output:  Current Shift: No intake/output data recorded.  Last three shifts: 09/26 1901 - 09/28 0700  In: 970 [P.O.:320; I.V.:600]  Out: 650 [Urine:650]    Physical Exam:   Skin:  Extremities and face reveal no rashes. No palmer erythema.   HEENT: Sclerae anicteric. Extra-occular muscles are intact. No abnormal pigmentation of the lips. The neck is supple.  Cardiovascular: Regular rate and rhythm.  Respiratory:  Comfortable breathing with no accessory muscle use.   GI:  Abdomen nondistended, soft, and nontender. No enlargement of the liver or spleen. No masses palpable.  Rectal:  Deferred  Musculoskeletal: Extremities have good range of motion.  Neurological:  Gross memory appears intact.  Patient is alert and oriented.  Psychiatric:  Mood appears appropriate with judgement intact.  Lymphatic:  No visible adenopathy      Lab/Data Review:  Recent Results (from the past 24 hour(s))   CBC with Auto Differential    Collection Time: 10/20/21  5:33 AM   Result Value Ref Range    WBC 11.5 (H) 3.6 - 11.0 K/uL    RBC 3.33 (L) 3.80 - 5.20 M/uL    Hemoglobin 9.8 (L) 11.5 - 16.0 g/dL    Hematocrit 31.3 (L) 35.0 - 47.0 %    MCV 94.0 80.0 - 99.0 FL    MCH 29.4 26.0 - 34.0 PG    MCHC 31.3 30.0 - 36.5 g/dL    RDW 15.6 (H) 11.5 - 14.5 %    Platelets 275 150 - 400 K/uL    MPV 8.8 (L) 8.9 - 12.9 FL    Nucleated  RBCs 0.0 0.0 PER 100 WBC    nRBC 0.00 0.00 - 0.01 K/uL    Neutrophils % 69 32 - 75 %    Lymphocytes % 21 12 - 49 %    Monocytes % 6 5 - 13 %    Eosinophils % 4 0 - 7 %    Basophils % 0 0 - 1 %    Immature Granulocytes 0 0 - 0.5 %    Neutrophils Absolute 8.0 1.8 - 8.0 K/UL    Lymphocytes Absolute 2.4 0.8 - 3.5 K/UL    Monocytes Absolute 0.6 0.0 - 1.0 K/UL    Eosinophils Absolute 0.4 0.0 - 0.4 K/UL    Basophils Absolute 0.0 0.0 - 0.1 K/UL    Absolute Immature Granulocyte 0.1 (H) 0.00 - 0.04 K/UL    Differential Type AUTOMATED     Basic Metabolic Panel    Collection  Time: 10/20/21  5:33 AM   Result Value Ref Range    Sodium 142 136 - 145 mmol/L    Potassium 3.5 3.5 - 5.1 mmol/L    Chloride 114 (H) 97 - 108 mmol/L    CO2 22 21 - 32 mmol/L    Anion Gap 6 5 - 15 mmol/L    Glucose 81 65 - 100 mg/dL    BUN 25 (H) 6 - 20 mg/dL    Creatinine 9.41 7.40 - 1.02 mg/dL    Bun/Cre Ratio 35 (H) 12 - 20      Est, Glom Filt Rate >60 >60 ml/min/1.77m2    Calcium 7.6 (L) 8.5 - 10.1 mg/dL   C-Reactive Protein    Collection Time: 10/20/21  5:33 AM   Result Value Ref Range    CRP 5.30 (H) 0.00 - 0.60 mg/dL              CT ABDOMEN PELVIS W IV CONTRAST Additional Contrast? None   Final Result      1. Long segment wall thickening of the descending and sigmoid colon consistent   with infectious or inflammatory colitis.      2. No free fluid or fluid collections.      3. Chronic appearing fracture of the upper endplate of L2.      XR CHEST PORTABLE   Final Result      No acute process on portable chest.             Assessment:     Principal Problem:    Colitis  Resolved Problems:    * No resolved hospital problems. *    Infectious enterocolitis  Plan:   Ciprofloxacin and Flagyl for 5 days  Atrial fibrillation follow-up as an outpatient  Thank you for allowing me to participate in this patients care    Signed By: Era Bumpers, MD     October 20, 2021

## 2021-10-20 NOTE — Progress Notes (Cosign Needed)
Hospitalist Progress Note    NAME:   [name]   DOB: [birth year]   MRN: [id number]     Date/Time: 10/20/2021 12:34 PM  Patient PCP: Vevelyn Francois, APRN - NP    Estimated discharge date: 24 hours  Barriers: SNF placement    Hospital course:  Patient is a 76 year old female with past medical history of COPD, HLD, HTN and chronic L2 fracture who was admitted on 10/18/2021 with generalized weakness and multiple episodes of diarrhea.  CT of the abdomen pelvis revealed long segment of wall thickening of descending and sigmoid colon consistent with infectious or inflammatory colitis.  No free fluid or fluid collection.  GI consulted recommended stool culture collection.  UA admission revealed moderate leukoesterase with 4+ bacteria and 50-100 WBC.  Patient initiated on Zosyn.  Blood cultures pending.  Urine culture revealed gram-negative rods, continue IV Zosyn.  GI consulted and evaluated patient recommending stool cultures.  Also added Flagyl for coverage.  C. difficile negative, will transition patient to p.o. Cipro upon discharge.  PT OT evaluate patient recommended SNF placement.    Assessment / Plan:  Acute colitis  -CT abdomen pelvis revealed long segment of wall thickening of descending and sigmoid colon consistent with infectious or inflammatory colitis.  No free fluid or fluid collection  -WBC 21.7>16.9  -Lactic 2.4>0.8, without ischemia  -CRP 9.01>5.30  -Pro-Cal pending  -Blood cultures pending  -Continue full liquid diet  -IV fluids  -C. difficile negative  -Enteric bacterial panel pending  -GI consulted and recommended stool cultures  -Continue IV Zosyn and Flagyl, will transition to p.o. Cipro upon discharge    UTI  -UA revealed moderate leukoesterase with 4+ bacteria and 50-100 WBC  -WBC 21.7>16.9>11.5  -Urine cultures revealed gram-negative rods  -Continue IV Zosyn    AKI likely secondary to dehydration  -Creatinine 1.79> 0.85>0.72  -Continue IV fluids    Generalized weakness/failure to thrive; patient  lives at home alone  -PT and OT evaluated patient recommend SNF placement    Chronic L2 fracture  -CT of the abdomen revealed chronic appearing fracture of the upper endplate of L2  -Continue to monitor and consider MRI if necessary    History of COPD without supplemental oxygen needed at home  -Continue home medication    History of HTN  -Continue medication    Medical Decision Making:   I personally reviewed labs: CBC BMP CRP urine culture C. difficile  I personally reviewed imaging: None  Toxic drug monitoring: Adverse reaction antibiotic, heparin for any signs bleeding, monitor PTT  Discussed case with: Patient        Code Status: DNR  DVT Prophylaxis: Heparin  GI Prophylaxis: Protonix    Subjective:     Chief Complaint / Reason for Physician Visit  Patient evaluated bedside this morning eating breakfast.  She reports overall improvement after having large stool disimpacted yesterday. She denies any chest pain, short of breath, nausea, vomiting, abdominal pain or any other symptoms this time discussed with RN events overnight.       Objective:     VITALS:   Last 24hrs VS reviewed since prior progress note. Most recent are:  Patient Vitals for the past 24 hrs:   BP Temp Temp src Pulse Resp SpO2 Weight   10/20/21 1053 -- -- -- -- -- 99 % --   10/20/21 0757 131/77 97.9 F (36.6 C) Oral 66 -- 100 % --   10/20/21 0730 131/77 97.9 F (36.6 C) Oral 66 18 -- --  10/20/21 0600 -- -- -- -- -- -- 41.7 kg (91 lb 14 oz)   10/20/21 0346 112/71 97.9 F (36.6 C) Oral 68 17 100 % --   10/19/21 2000 114/72 98.1 F (36.7 C) Oral 82 18 99 % --   10/19/21 1445 115/73 97.4 F (36.3 C) Oral 62 -- 92 % --           Intake/Output Summary (Last 24 hours) at 10/20/2021 1234  Last data filed at 10/20/2021 0507  Gross per 24 hour   Intake 850 ml   Output 300 ml   Net 550 ml          I had a face to face encounter and independently examined this patient on 10/20/2021, as outlined below:    Review of Systems   Constitutional:  Positive for  fatigue. Negative for activity change, appetite change, chills and fever.   HENT:  Negative for congestion, rhinorrhea, sore throat and trouble swallowing.    Eyes: Negative.    Respiratory:  Negative for cough, shortness of breath and wheezing.    Cardiovascular:  Negative for chest pain, palpitations and leg swelling.   Gastrointestinal:  Negative for abdominal pain, diarrhea, nausea and vomiting.   Endocrine: Negative.    Genitourinary:  Negative for dysuria and hematuria.   Musculoskeletal:  Negative for back pain, neck pain and neck stiffness.   Skin: Negative.    Allergic/Immunologic: Negative.    Neurological:  Positive for weakness. Negative for dizziness, numbness and headaches.   Hematological: Negative.    Psychiatric/Behavioral: Negative.     All other systems reviewed and are negative.       PHYSICAL EXAM:  Physical Exam  Vitals reviewed.   Constitutional:       General: She is not in acute distress.     Appearance: She is not ill-appearing.      Comments: Thin build   HENT:      Head: Normocephalic.   Eyes:      Extraocular Movements: Extraocular movements intact.   Cardiovascular:      Rate and Rhythm: Normal rate and regular rhythm.      Heart sounds: Normal heart sounds.   Pulmonary:      Effort: Pulmonary effort is normal.      Breath sounds: Normal breath sounds.   Abdominal:      General: Abdomen is flat.      Palpations: Abdomen is soft.      Tenderness: There is no abdominal tenderness.   Musculoskeletal:         General: No tenderness. Normal range of motion.      Cervical back: Normal range of motion. No tenderness.      Right lower leg: No edema.      Left lower leg: No edema.   Neurological:      Mental Status: She is alert and oriented to person, place, and time.          Reviewed most current lab test results and cultures  YES  Reviewed most current radiology test results   YES  Review and summation of old records today    NO  Reviewed patient's current orders and MAR    YES  PMH/SH  reviewed - no change compared to H&P  ________________________________________________________________________  Care Plan discussed with:    Comments   Patient x    Family      RN x    Care Manager     Consultant  Multidiciplinary team rounds were held today with case manager, nursing, pharmacist and clinical coordinator.  Patient's plan of care was discussed; medications were reviewed and discharge planning was addressed.     ________________________________________________________________________  Total NON critical care TIME:  35  Minutes    Total CRITICAL CARE TIME Spent:   Minutes non procedure based      Comments   >50% of visit spent in counseling and coordination of care     ________________________________________________________________________  Rolla Flatten, PA-C     Procedures: see electronic medical records for all procedures/Xrays and details which were not copied into this note but were reviewed prior to creation of Plan.      LABS:  I reviewed today's most current labs and imaging studies.  Pertinent labs include:  Recent Labs     10/18/21  1122 10/19/21  0632 10/20/21  0533   WBC 21.7* 16.9* 11.5*   HGB 13.9 11.2* 9.8*   HCT 43.7 35.5 31.3*   PLT 524* 347 275       Recent Labs     10/18/21  1122 10/19/21  0632 10/20/21  0533   NA 144 143 142   K Hemolyzed, Recollection Recommended 3.8 3.5   CL 109* 110* 114*   CO2 24 25 22    BUN 42* 32* 25*   ALT 16  --   --          Signed: Rolla Flatten, PA-C

## 2021-10-20 NOTE — Plan of Care (Signed)
PHYSICAL THERAPY TREATMENT     Patient: [name] (76 y.o. female)  Date: 10/20/2021  Diagnosis: Infectious colitis [A09]  Colitis [K52.9] Colitis      Precautions:  Fall Risk, General Precautions                Recommendations for nursing mobility: Out of bed to chair for meals, Encourage HEP in prep for ADLs/mobility; see handout for details, and Use of bed/chair alarm for safety    In place during session: Peripheral IV, Nasal Cannula 4L, and EKG/telemetry   Chart, physical therapy assessment, plan of care and goals were reviewed.  ASSESSMENT  Patient continues with skilled PT services and is progressing towards goals. Pt semi supine upon PT/OT arrival, agreeable to session. Pt A&O x 4. (See below for objective details and assist levels).     Overall pt tolerated session fair today with bed mobility, transfers, ambulation and seated therex, limtied by activity tolerance and endurance. Pt demo's significant improvement with functional mobility, able to progress to A x1. Performed bed mobility and transfers with CGA. Performed ~3sit<>Stands with constant cueing for UE placement. Pt increased ambulation distances however HR increasing to ~160's limited further progression, pt symptomatic and educated on notifying therapist for safety. Participated in seated therex end of session with TC/VC for proper form and full ROM. Pt left seated in recliner with all needs in reach, educated on calling nursing for A to return to bed. Will continue to benefit from skilled PT services, and will continue to progress as tolerated. Potential barriers for safe discharge: pt has poor safety awareness, pt is a high fall risk, pt is not safe to be alone, and concern for pt safely navigating or managing the home environment. Current PT DC recommendation Skilled Nursing Facility once medically appropriate.     Start of Session End of Session   SPO2 (%) 96 97   Heart Rate (BPM) 81 80     GOALS:    Problem: Physical Therapy -  Adult  Goal: By Discharge: Performs mobility at highest level of function for planned discharge setting.  See evaluation for individualized goals.  Description: FUNCTIONAL STATUS PRIOR TO ADMISSION: Patient was modified independent using a single point cane for functional mobility.    HOME SUPPORT PRIOR TO ADMISSION: The patient lived alone with friends to provide assistance.    Physical Therapy Goals  Initiated 10/19/2021  Pt stated goal: get better  Pt will be I with LE HEP in 7 days.  Pt will perform bed mobility with Minimal Assist in 7 days.  Pt will perform transfers with Minimal Assist in 7 days.   Pt will amb 40 feet with LRAD safely with Minimal Assist in 7 days.    Pt will verbalize and demonstrate compliance with fall precautions   in 7 days.   Pt will demonstrate improvement in standing  balance from poor to fair in 7 days.    Outcome: Progressing          PLAN :  Patient continues to benefit from skilled intervention to address functional impairments. Continue treatment per established plan of care to address goals.    Recommendation for discharge: (in order for the patient to meet his/her long term goals)  Skilled Nursing Facility    IF patient discharges home will need the following BJS:[id number] to assess with progress       SUBJECTIVE:   Patient stated "I'm doing much better today."    OBJECTIVE DATA SUMMARY:  Critical Behavior:  Orientation  Orientation Level: Oriented to situation;Oriented to person;Oriented to place;Disoriented to time       Functional Mobility Training:  Bed Mobility:  Bed Mobility Training  Bed Mobility Training: Yes  Supine to Sit: Contact-guard assistance  Sit to Supine: Contact-guard assistance  Scooting: Contact-guard assistance  Transfers:  Pharmacologist: Yes  Interventions: Safety awareness training  Sit to Stand: Contact-guard assistance;Additional time  Stand to Sit: Contact-guard assistance;Additional time  Toilet Transfer: Contact-guard  assistance;Assist X1;Additional time  Balance:  Balance  Sitting: Intact  Standing: Impaired  Standing - Static: Fair;Constant support  Standing - Dynamic: Fair;Constant support  Wheelchair Mobility:     Ambulation/Gait Training:     Gait  Gait Abnormalities: Shuffling gait  Distance (ft): 6 Feet  Assistive Device: Gait belt;Walker, rolling      Therapeutic Exercises:     EXERCISE   Sets   Reps   Active Active Assist   Passive Self ROM   Comments   Ankle Pumps 1 10 [x]  []  []  []     Quad Sets/Glut Sets   []  []  []  []     Hamstring Sets   []  []  []  []     Short Arc Quads   []  []  []  []     Heel Slides   []  []  []  []     Straight Leg Raises   []  []  []  []     Hip abd/add 1 10 [x]  []  []  []     Long Arc Quads 1 10 [x]  []  []  []     Marching 1 10 [x]  []  []  []        []  []  []  []        Pain Rating:  0/10  reported  Pain Intervention(s):   pain is at a level acceptable to the patient    Activity Tolerance:   Fair  and requires rest breaks    After treatment patient left in no apparent distress:   Bed locked and returned to lowest position, Patient left in no apparent distress sitting up in chair and Call bell within reach, and nsg updated     COMMUNICATION/COLLABORATION:   The patient's plan of care was discussed with: Occupational therapist and Registered nurse    Patient Education  Education Given To: Patient  Education Provided: Role of Therapy;Plan of Care;Fall Prevention Strategies;Transfer Training;Precautions  Education Method: Demonstration;Verbal;Teach Back  Barriers to Learning: None  Education Outcome: Verbalized understanding;Continued education needed;Unable to demonstrate understanding    PT/OT sessions occurred together for increased safety of pt and clinician.     Oneita Kras, PTA  Minutes: [phone]

## 2021-10-20 NOTE — Wound Image (Signed)
IP WOUND CONSULT    [place] RECORD NUMBER:  [id number]  AGE: 76 y.o.   GENDER: female  DOB: [birth year]  TODAY'S DATE:  10/20/2021    GENERAL     []  Follow-up   [x]  New Consult    [name] is a 76 y.o. female referred by:   [x]  Physician  []  Nursing  []  Other:         PAST MEDICAL HISTORY    Past Medical History:   Diagnosis Date    Chronic obstructive pulmonary disease ([place])     Hypercholesteremia     Insomnia     Sleep disorder         PAST SURGICAL HISTORY    Past Surgical History:   Procedure Laterality Date    ORTHOPEDIC SURGERY  05/26/2021    LEFT HIP REVISION POSTERIOR APPROACH WITH ALLOGRAFT    PARTIAL HYSTERECTOMY (CERVIX NOT REMOVED)      SHOULDER SURGERY Left        FAMILY HISTORY    No family history on file.      ALLERGIES    Allergies   Allergen Reactions    Cheese Nausea And Vomiting     Severe vomiting       MEDICATIONS    No current facility-administered medications on file prior to encounter.     Current Outpatient Medications on File Prior to Encounter   Medication Sig Dispense Refill    traMADol (ULTRAM) 50 MG tablet Take 1 tablet by mouth every 6 hours as needed for Pain. Max Daily Amount: 200 mg      atorvastatin (LIPITOR) 40 MG tablet Take 1 tablet by mouth daily      traZODone (DESYREL) 50 MG tablet Take 1 tablet by mouth at bedtime (Patient not taking: Reported on 10/18/2021)      rosuvastatin (CRESTOR) 10 MG tablet Take 1 tablet by mouth daily (Patient not taking: Reported on 10/18/2021)      melatonin 5 MG TABS tablet Take 1 tablet by mouth nightly as needed (sleep) (Patient not taking: Reported on 10/18/2021) 90 tablet 0    aspirin 81 MG EC tablet Take 1 tablet by mouth daily (Patient not taking: Reported on 10/18/2021) 30 tablet 3    metoprolol succinate (TOPROL XL) 25 MG extended release tablet Take 0.5 tablets by mouth daily 30 tablet 3    celecoxib (CELEBREX) 100 MG capsule Take 1 capsule by mouth 2 times daily (Patient not taking: Reported on 10/18/2021) 60 capsule 3     albuterol sulfate HFA (PROVENTIL;VENTOLIN;PROAIR) 108 (90 Base) MCG/ACT inhaler albuterol sulfate HFA 90 mcg/actuation aerosol inhaler   INHALE 2 PUFFS EVERY 6 HOURS AS NEEDED (Patient not taking: Reported on 10/18/2021)      fluticasone-umeclidin-vilant (TRELEGY ELLIPTA) 200-62.5-25 MCG/ACT AEPB inhaler Trelegy Ellipta 200 mcg-62.5 mcg-25 mcg powder for inhalation   INHALE 1 PUFF BY MOUTH ONCE A DAY (Patient not taking: Reported on 10/18/2021)      omeprazole (PRILOSEC) 20 MG delayed release capsule Take 1 capsule by mouth daily      polyethylene glycol (GLYCOLAX) 17 GM/SCOOP powder Take 17 g by mouth daily (Patient not taking: Reported on 10/18/2021)      senna-docusate (PERICOLACE) 8.6-50 MG per tablet Take 1 tablet by mouth 2 times daily (Patient not taking: Reported on 10/18/2021)            BP 131/77   Pulse 66   Temp 97.9 F (36.6 C) (Oral)   Resp 18  Ht 1.6 m (5\' 3" )   Wt 41.7 kg (91 lb 14 oz)   SpO2 99%   BMI 16.27 kg/m     ASSESSMENT     Wound Identification & Type: minor MASD to gluteal cleft  Dressing change: applied zinc paste  Verbal consent for picture: NA    Contributing Factors: decreased mobility, shear force, malnutrition, incontinence of stool, and incontinence of urine            PLAN     Skin Care & Pressure Relief Recommendations  Minimize Layers of Linen, Turn/reposition approximately every 2 hours, Manage incontinence, Promote continence; Skin protective lotion/cream to buttocks and sacrum daily and as needed with incontinence care, and Floating heels    Braden: 19  Blood Glucose: 81 on 10/20/21                             WBCs: 11.5 on 10/20/21    Nutritionist Consulted: Yes   Nutrition Status: Mild malnutrition    Support Surface: Gel    Physician/Provider notified:   Recommendations: minor MASD noted to gluteal r/t recent diarrhea.  Patient reports that diarrhea has improved. Apply zinc paste BID and prn for soiling to buttocks and perirectal area to protect skin from further  incontinence related breakdown.       Apply Optifoam Border Sacral foam dressing to sacrum every three days to redistribute pressure from bony prominence and prevent pressure and friction injury to skin.  Maintain the the external GU incontinence management system to manage GU incontinence.  Ensure the patient turns/repositions q2h to offload sacrum and buttocks to prevent pressure related skin injury.  When sitting in chair, ensure patient stands hourly to shift weight in chair to prevent pressure related skin injury to buttocks/ischiums.  Float heels with 1-2 pillows lengthwise while in bed for offloading of the heels.  Maintain HOB at 30 degrees or less, if not contraindicated, to reduce pressure to buttocks and sacrum.  Raise foot of bed to help prevent friction and shear injury from sliding down in the bed.   Re-consult [facility] for other wound/skin care concerns.      Discharge Wound Care Needs: Apply zinc paste BID and prn for soiling to buttocks and perirectal area to protect skin from further incontinence related breakdown.       Teaching completed with:   []  Patient           []  Family member       []  Caregiver          []  Nursing  []  Other    Patient/Caregiver Teaching:  Level of patient/caregiver understanding able to:   []  Indicates understanding       []  Needs reinforcement  []  Unsuccessful      []  Verbal Understanding  []  Demonstrated understanding       []  No evidence of learning  []  Refused teaching         []  N/A       Electronically signed by 10/22/21, RN on 10/20/2021 at 2:05 PM

## 2021-10-21 LAB — CULTURE, URINE: Colony count: >100000

## 2021-10-21 LAB — CBC WITH AUTO DIFFERENTIAL
Absolute Immature Granulocyte: 0.1 10*3/uL — ABNORMAL HIGH (ref 0.00–0.04)
Basophils %: 0 % (ref 0–1)
Basophils Absolute: 0 10*3/uL (ref 0.0–0.1)
Eosinophils %: 4 % (ref 0–7)
Eosinophils Absolute: 0.4 10*3/uL (ref 0.0–0.4)
Hematocrit: 31.3 % — ABNORMAL LOW (ref 35.0–47.0)
Hemoglobin: 9.9 g/dL — ABNORMAL LOW (ref 11.5–16.0)
Immature Granulocytes: 1 % — ABNORMAL HIGH (ref 0–0.5)
Lymphocytes %: 17 % (ref 12–49)
Lymphocytes Absolute: 1.7 10*3/uL (ref 0.8–3.5)
MCH: 28.9 PG (ref 26.0–34.0)
MCHC: 31.6 g/dL (ref 30.0–36.5)
MCV: 91.5 FL (ref 80.0–99.0)
MPV: 9 FL (ref 8.9–12.9)
Monocytes %: 5 % (ref 5–13)
Monocytes Absolute: 0.5 10*3/uL (ref 0.0–1.0)
Neutrophils %: 73 % (ref 32–75)
Neutrophils Absolute: 7.2 10*3/uL (ref 1.8–8.0)
Nucleated RBCs: 0 PER 100 WBC
Platelets: 275 10*3/uL (ref 150–400)
RBC: 3.42 M/uL — ABNORMAL LOW (ref 3.80–5.20)
RDW: 15.2 % — ABNORMAL HIGH (ref 11.5–14.5)
WBC: 9.9 10*3/uL (ref 3.6–11.0)
nRBC: 0 10*3/uL (ref 0.00–0.01)

## 2021-10-21 LAB — CULTURE, BLOOD, PCR ID PANEL RESULTS REPORT
Acinetobacter calcoac baumannii complex by PCR: NOT DETECTED
Bacteroides fragilis by PCR: NOT DETECTED
Biofire test comment: POSITIVE
Candida albicans by PCR: NOT DETECTED
Candida auris by PCR: NOT DETECTED
Candida glabrata: NOT DETECTED
Candida krusei by PCR: NOT DETECTED
Candida parapsilosis by PCR: NOT DETECTED
Candida tropicalis by PCR: NOT DETECTED
Cryptococcus neoformans/gattii by PCR: NOT DETECTED
Enterobacter cloacae complex by PCR: NOT DETECTED
Enterobacteriaceae by PCR: NOT DETECTED
Enterococcus faecalis by PCR: NOT DETECTED
Enterococcus faecium by PCR: NOT DETECTED
Escherichia Coli: NOT DETECTED
Haemophilus Influenzae by PCR: NOT DETECTED
Klebsiella aerogenes by PCR: NOT DETECTED
Klebsiella oxytoca by PCR: NOT DETECTED
Klebsiella pneumoniae group by PCR: NOT DETECTED
Listeria monocytogenes by PCR: NOT DETECTED
Neisseria meningitidis by PCR: NOT DETECTED
Proteus by PCR: NOT DETECTED
Pseudomonas aeruginosa: NOT DETECTED
STAPHYLOCOCCUS: DETECTED — AB
STREPTOCOCCUS: NOT DETECTED
Salmonella species by PCR: NOT DETECTED
Serratia marcescens by PCR: NOT DETECTED
Staphylococcus Aureus: NOT DETECTED
Staphylococcus epidermidis by PCR: NOT DETECTED
Staphylococcus lugdunensis by PCR: NOT DETECTED
Stenotrophomonas maltophilia by PCR: NOT DETECTED
Strep pneumoniae: NOT DETECTED
Strep pyogenes,(Grp. A): NOT DETECTED
Streptococcus agalactiae (Group B): NOT DETECTED

## 2021-10-21 LAB — BASIC METABOLIC PANEL
Anion Gap: 6 mmol/L (ref 5–15)
BUN: 16 mg/dL (ref 6–20)
Bun/Cre Ratio: 25 — ABNORMAL HIGH (ref 12–20)
CO2: 23 mmol/L (ref 21–32)
Calcium: 7.4 mg/dL — ABNORMAL LOW (ref 8.5–10.1)
Chloride: 113 mmol/L — ABNORMAL HIGH (ref 97–108)
Creatinine: 0.63 mg/dL (ref 0.55–1.02)
Est, Glom Filt Rate: >60 mL/min/{1.73_m2} (ref >60)
Glucose: 91 mg/dL (ref 65–100)
Potassium: 3.1 mmol/L — ABNORMAL LOW (ref 3.5–5.1)
Sodium: 142 mmol/L (ref 136–145)

## 2021-10-21 MED ORDER — METOPROLOL SUCCINATE ER 25 MG PO TB24
25 MG | ORAL_TABLET | Freq: Two times a day (BID) | ORAL | 3 refills | Status: AC
Start: 2021-10-21 — End: 2021-11-15

## 2021-10-21 MED ORDER — METRONIDAZOLE 500 MG PO TABS
500 MG | ORAL_TABLET | Freq: Three times a day (TID) | ORAL | 0 refills | Status: AC
Start: 2021-10-21 — End: 2021-10-26

## 2021-10-21 MED ORDER — CIPROFLOXACIN HCL 500 MG PO TABS
500 MG | ORAL_TABLET | Freq: Two times a day (BID) | ORAL | 0 refills | Status: AC
Start: 2021-10-21 — End: 2021-10-28

## 2021-10-21 MED ORDER — POTASSIUM CHLORIDE CRYS ER 20 MEQ PO TBCR
20 MEQ | Freq: Two times a day (BID) | ORAL | Status: DC
Start: 2021-10-21 — End: 2021-10-21
  Administered 2021-10-21: 13:00:00 40 meq via ORAL

## 2021-10-21 MED ORDER — METRONIDAZOLE 500 MG PO TABS
500 MG | ORAL_TABLET | Freq: Three times a day (TID) | ORAL | 0 refills | Status: DC
Start: 2021-10-21 — End: 2021-10-21

## 2021-10-21 MED FILL — PIPERACILLIN SOD-TAZOBACTAM SO 3.375 (3-0.375) G IV SOLR: 3.375 (3-0.375) g | INTRAVENOUS | Qty: 3375

## 2021-10-21 MED FILL — METOPROLOL SUCCINATE ER 25 MG PO TB24: 25 MG | ORAL | Qty: 1

## 2021-10-21 MED FILL — PANTOPRAZOLE SODIUM 40 MG PO TBEC: 40 MG | ORAL | Qty: 1

## 2021-10-21 MED FILL — HEPARIN SODIUM (PORCINE) 5000 UNIT/ML IJ SOLN: 5000 UNIT/ML | INTRAMUSCULAR | Qty: 1

## 2021-10-21 MED FILL — METRONIDAZOLE 500 MG PO TABS: 500 MG | ORAL | Qty: 1

## 2021-10-21 MED FILL — ASPIRIN LOW DOSE 81 MG PO TBEC: 81 MG | ORAL | Qty: 1

## 2021-10-21 MED FILL — BUDESONIDE-FORMOTEROL FUMARATE 160-4.5 MCG/ACT IN AERO: RESPIRATORY_TRACT | Qty: 0.2

## 2021-10-21 MED FILL — TIOTROPIUM BROMIDE MONOHYDRATE 2.5 MCG/ACT IN AERS: 2.5 MCG/ACT | RESPIRATORY_TRACT | Qty: 2

## 2021-10-21 MED FILL — POTASSIUM CHLORIDE CRYS ER 20 MEQ PO TBCR: 20 MEQ | ORAL | Qty: 2

## 2021-10-21 MED FILL — ATORVASTATIN CALCIUM 40 MG PO TABS: 40 MG | ORAL | Qty: 1

## 2021-10-21 NOTE — Progress Notes (Signed)
Discharge plan of care/case management plan validated with provider discharge order. Discharge instructions/education printed and placed in folder. PIV x1 removed with cath tip intact. Telemetry sent back to 4W. No foley. Pt wheeled down to main entrance accompanied by PCT and safely transferred to cab to be transported to [facility]. Report called at 1545 to receiving nurse, Oley Balm. Pt condition stable at time of discharge.

## 2021-10-21 NOTE — Discharge Instructions (Addendum)
Ciprofloxacin and Flagyl for 5-days  Follow-up with GI as needed  Follow-up cardiology for further evaluation of A-fib  Follow-up with PCP

## 2021-10-21 NOTE — Care Coordination-Inpatient (Addendum)
Patient received auth for SNF at [facility]. Plans for discharge today to [facility]. Patient can go to room 205A. Patient is aware and in agreement.    Nurse to call report at [phone].    Transition of Care Plan:    RUR: 14%  Prior Level of Functioning: ind  Disposition: SNF/[facility]  If SNF or IPR: Date FOC offered: 9/27  Date [place] received: 9/27  Accepting facility: [facility]  Date authorization started with reference number: 9/28  Date authorization received and expires: 9/29  Follow up appointments:   DME needed:   Transportation at discharge: will need cab voucher for transport  IM/IMM Medicare/Tricare letter given: yes   Is patient a Veteran and connected with VA? na  If yes, was [employer] transfer form completed and VA notified? na  Caregiver Contact: na  Discharge Caregiver contacted prior to discharge? na  Care Conference needed? na  Barriers to discharge:  na

## 2021-10-21 NOTE — Plan of Care (Signed)
PHYSICAL THERAPY TREATMENT     Patient: [name] (76 y.o. female)  Date: 10/21/2021  Diagnosis: Infectious colitis [A09]  Colitis [K52.9] Colitis      Precautions: Fall Risk, General Precautions                Recommendations for nursing mobility: Out of bed to chair for meals, Encourage HEP in prep for ADLs/mobility; see handout for details, and AD and gt belt for bed to chair     In place during session: Peripheral IV, External Catheter, and EKG/telemetry   Chart, physical therapy assessment, plan of care and goals were reviewed.  ASSESSMENT  Patient continues with skilled PT services and is progressing towards goals. Pt semi supine upon PTA arrival, agreeable to session. Pt A&O x 2. (See below for objective details and assist levels).     Overall pt tolerated session fair today with bed mobility, therex, transfers and ambulation. Pt able to participate in seated and supine therex with max cueing for proper form. Continues to perform bed mobility with CGA and increased time. Tolerated x4 sit<>stand with min A and cueing for sequencing. Pt with intermittent confusion throughout session requiring additional time for repetition and processing commands. Pt with complaints of difficulty with breathing, nursing notified of pt concerns Will continue to benefit from skilled PT services, and will continue to progress as tolerated. Potential barriers for safe discharge: pt has poor safety awareness, pt has impaired cognition, pt is a high fall risk, pt is not safe to be alone, and concern for pt safely navigating or managing the home environment. Current PT DC recommendation Skilled Nursing Facility once medically appropriate.     Start of Session End of Session   SPO2 (%) 93 95   Heart Rate (BPM) 83 93     GOALS:    Problem: Physical Therapy - Adult  Goal: By Discharge: Performs mobility at highest level of function for planned discharge setting.  See evaluation for individualized goals.  Description:  FUNCTIONAL STATUS PRIOR TO ADMISSION: Patient was modified independent using a single point cane for functional mobility.    HOME SUPPORT PRIOR TO ADMISSION: The patient lived alone with friends to provide assistance.    Physical Therapy Goals  Initiated 10/19/2021  Pt stated goal: get better  Pt will be I with LE HEP in 7 days.  Pt will perform bed mobility with Minimal Assist in 7 days.  Pt will perform transfers with Minimal Assist in 7 days.   Pt will amb 40 feet with LRAD safely with Minimal Assist in 7 days.    Pt will verbalize and demonstrate compliance with fall precautions   in 7 days.   Pt will demonstrate improvement in standing  balance from poor to fair in 7 days.    Outcome: Progressing          PLAN :  Patient continues to benefit from skilled intervention to address functional impairments. Continue treatment per established plan of care to address goals.    Recommendation for discharge: (in order for the patient to meet his/her long term goals)  Skilled Nursing Facility    IF patient discharges home will need the following YQI:[id number] to assess with progress       SUBJECTIVE:   Patient stated "My breathing feels heavy."    OBJECTIVE DATA SUMMARY:   Critical Behavior:  Orientation  Orientation Level: Oriented to person;Oriented to time;Oriented to place;Disoriented to situation  Cognition  Overall Cognitive Status: Exceptions  Following Commands:  Follows one step commands with repetition;Follows one step commands with increased time  Attention Span: Attends with cues to redirect  Memory: Decreased short term memory  Safety Judgement: Decreased awareness of need for assistance  Problem Solving: Assistance required to generate solutions;Assistance required to identify errors made  Insights: Decreased awareness of deficits  Initiation: Requires cues for some  Sequencing: Requires cues for some    Functional Mobility Training:  Bed Mobility:  Bed Mobility Training  Interventions: Verbal cues;Tactile  cues;Safety awareness training  Supine to Sit: Contact-guard assistance  Sit to Supine: Contact-guard assistance  Scooting: Contact-guard assistance  Transfers:  Pharmacologist: Yes  Interventions: Safety awareness training  Sit to Stand: Minimum assistance;Assist X1;Additional time  Stand to Sit: Minimum assistance;Assist X1;Additional time  Balance:  Balance  Sitting: Impaired  Sitting - Static: Fair (occasional)  Sitting - Dynamic: Fair (occasional)  Standing: Impaired  Standing - Static: Fair;Constant support  Standing - Dynamic: Poor;Fair;Constant support  Wheelchair Mobility:     Ambulation/Gait Training:     Secretary/administrator of Support: Narrowed  Speed/Cadence: Slow;Shuffled  Step Length: Left shortened;Right shortened  Distance (ft): 6 Feet      Therapeutic Exercises:     EXERCISE   Sets   Reps   Active Active Assist   Passive Self ROM   Comments   Ankle Pumps 1 20 [x]  []  []  []     Quad Sets/Glut Sets   []  []  []  []     Hamstring Sets   []  []  []  []     Short Arc Quads   []  []  []  []     Heel Slides 1 10 [x]  []  []  []     Straight Leg Raises   []  []  []  []     Hip abd/add 1 10 [x]  []  []  []     Long Arc Quads 1 10 [x]  []  []  []     Marching   []  []  []  []        []  []  []  []        Pain Rating:  0/10 reported  Pain Intervention(s):   pain is at a level acceptable to the patient    Activity Tolerance:   Fair , requires frequent rest breaks, observed shortness of breath on exertion, and SpO2 stable on room air    After treatment patient left in no apparent distress:   Bed locked and returned to lowest position, Patient left in no apparent distress sitting up in chair and Call bell within reach, and nsg updated     COMMUNICATION/COLLABORATION:   The patient's plan of care was discussed with: Registered nurse    Patient Education  Education Given To: Patient  Education Provided: Role of Therapy;Plan of Care;Fall Prevention Strategies;Transfer Training;Precautions  Education Method: Demonstration;Verbal;Teach  Back  Barriers to Learning: Cognition  Education Outcome: Verbalized understanding;Continued education needed;Unable to demonstrate understanding    Oneita Kras, PTA  Minutes: 40

## 2021-10-21 NOTE — Discharge Summary (Signed)
Discharge Summary    Name: [name]  [id number]  Date of Birth: 06-27-1945 (Age: 76 y.o.)   Date of Admission: 10/18/2021  Date of Discharge: 10/21/2021  Attending Physician: Effie Shy, MD    Discharge Diagnosis:   Principal Problem:    Colitis  Active Problems:    UTI (urinary tract infection)    AKI (acute kidney injury) (HCC)  Resolved Problems:    * No resolved hospital problems. *       Consultations:  IP CONSULT TO GI  IP CONSULT TO PICC TEAM  IP WOUND CARE NURSE CONSULT TO EVAL      Brief Admission History/Reason for Admission Per Sami Simona Huh, MD:       Brief Hospital Course by Main Problems:   Patient is a 76 year old female with past medical history of COPD, HLD, HTN, L2 fracture who was admitted on 10/18/2021 with explosive diarrhea.  CT of the abdomen pelvis with contrast revealed Long segment wall thickening of the descending and sigmoid colon consistent with infectious or inflammatory colitis.  No free fluid or fluid collection.  GI consulted and recommended stool culture collection.  C. difficile negative and enteric bacterial panel pending.  GI reevaluated patient and recommended to ciprofloxacin and Flagyl for 5-days.  Also, during mission UA revealed moderate leukoesterase with 4+ bacteria and 50-100 WBCs.  Patient has been receiving IV Zosyn, but p.o. Cipro will also cover for continued UTI treatment.  PT and OT evaluated patient due to generalized weakness and failure to thrive as patient lives alone and they recommended SNF placement.  Patient has been accepted [facility].  Discharge plan discussed with patient, which she agrees to.  All questions answered and patient will be transported to [facility] health care    Discharge instructions:  Ciprofloxacin and Flagyl for 5-days  Follow-up with GI as needed  Follow-up cardiology for further evaluation of A-fib  Follow-up with PCP    Discharge Exam:  Patient seen and examined by me on  discharge day.  Pertinent Findings:  Patient Vitals for the past 24 hrs:   BP Temp Temp src Pulse Resp SpO2   10/21/21 0811 (!) 143/81 97.9 F (36.6 C) Axillary 76 -- 94 %   10/21/21 0736 -- -- -- 70 16 96 %   10/21/21 0257 (!) 140/85 97.9 F (36.6 C) Oral 70 18 --   10/21/21 0155 (!) 152/70 97.7 F (36.5 C) Axillary 70 18 97 %   10/20/21 1946 -- -- -- -- -- 97 %   10/20/21 1919 120/65 98.6 F (37 C) Oral 77 18 96 %   10/20/21 1435 105/69 98.2 F (36.8 C) Oral 65 14 97 %       Gen:    Not in distress  Chest: Clear lungs  CVS:   Regular rhythm.  No edema  Abd:  Soft, not distended, not tender    Discharge/Recent Laboratory Results:  Recent Labs     10/21/21  0633   NA 142   K 3.1*   CL 113*   CO2 23   BUN 16   CREATININE 0.63   GLUCOSE 91   CALCIUM 7.4*     Recent Labs     10/21/21  0633   HGB 9.9*   HCT 31.3*   WBC 9.9   PLT 275       Discharge Medications:     Medication List        START taking these medications  albuterol sulfate HFA 108 (90 Base) MCG/ACT inhaler  Commonly known as: PROVENTIL;VENTOLIN;PROAIR     aspirin 81 MG EC tablet  Take 1 tablet by mouth daily     ciprofloxacin 500 MG tablet  Commonly known as: CIPRO  Take 1 tablet by mouth 2 times daily for 7 days     metroNIDAZOLE 500 MG tablet  Commonly known as: FLAGYL  Take 1 tablet by mouth every 8 hours for 7 days     polyethylene glycol 17 GM/SCOOP powder  Commonly known as: GLYCOLAX     Trelegy Ellipta 200-62.5-25 MCG/ACT Aepb inhaler  Generic drug: fluticasone-umeclidin-vilant            CHANGE how you take these medications      metoprolol succinate 25 MG extended release tablet  Commonly known as: TOPROL XL  Take 0.5 tablets by mouth in the morning and at bedtime  What changed: when to take this            CONTINUE taking these medications      atorvastatin 40 MG tablet  Commonly known as: LIPITOR     omeprazole 20 MG delayed release capsule  Commonly known as: PRILOSEC            STOP taking these medications      celecoxib 100 MG  capsule  Commonly known as: CELEBREX     melatonin 5 MG Tabs tablet     rosuvastatin 10 MG tablet  Commonly known as: CRESTOR     senna-docusate 8.6-50 MG per tablet  Commonly known as: PERICOLACE     traMADol 50 MG tablet  Commonly known as: ULTRAM     traZODone 50 MG tablet  Commonly known as: DESYREL               Where to Get Your Medications        These medications were sent to [id number] [employer], [place] - [place] [phone] - F [phone]  [address] [place] [address]      Phone: [phone]   ciprofloxacin 500 MG tablet  metoprolol succinate 25 MG extended release tablet  metroNIDAZOLE 500 MG tablet             DISPOSITION:    Home with Family:       Home with HH/PT/OT/RN:    SNF/LTC: [employer]:    OTHER:            Code status:   Recommended diet: low fat, low cholesterol diet  Recommended activity: activity as tolerated  Wound care: None      Follow up with:   PCP : Rigoberto Noel, APRN - NP  Gwenlyn Fudge, MD  [address]  [address] [address]  [phone]    Follow up      Azzie Glatter, MD  [place] [address]  [phone]    Follow up      Rigoberto Noel, APRN - NP  [address]  [place] [address]  [phone]    Follow up            Total time in minutes spent coordinating this discharge (includes going over instructions, follow-up, prescriptions, and preparing report for sign off to her PCP) :  35 minutes

## 2021-10-21 NOTE — Other (Signed)
Chales Salmon, PA-C  Physician Assistant  Internal Medicine  Progress Notes      Signed  Date of Service:  10/19/2021 12:15 PM     Signed                                                                                                                                                                                                                                          Hospitalist Progress Note     NAME:              [name]   DOB:   23-Nov-1945   MRN:   [id number]      Date/Time: 10/19/2021 12:15 PM  Patient PCP: Vevelyn Francois, APRN - NP     Estimated discharge date: 24 to 48 hours  Barriers: IV antibiotics, PT/OT and C. difficile/enteric bacterial panel     Hospital course:  Patient is a 76 year old female with past medical history of COPD, HLD, HTN and chronic L2 fracture who was admitted on 10/18/2021 with generalized weakness and multiple episodes of diarrhea.  CT of the abdomen pelvis revealed long segment of wall thickening of descending and sigmoid colon consistent with infectious or inflammatory colitis.  No free fluid or fluid collection.  GI consulted recommended stool culture collection.  UA admission revealed moderate leukoesterase with 4+ bacteria and 50-100 WBC.  Patient initiated on Zosyn.  Urine and blood cultures pending.     Assessment / Plan:  Acute colitis  -CT abdomen pelvis revealed long segment of wall thickening of descending and sigmoid colon consistent with infectious or inflammatory colitis.  No free fluid or fluid collection  -WBC 21.7>16.9  -Lactic 2.4>0.8, without ischemia  -CRP 9.01  -Pro-Cal pending  -Blood cultures pending  -N.p.o. this time  -IV fluids  -C. difficile and enteric bacterial panel pending  -GI consulted and recommended stool cultures     UTI  -UA revealed moderate leukoesterase with 4+ bacteria and 50-100 WBC  -WBC 21.7>16.9  -Urine cultures pending  -Continue IV Zosyn     AKI likely secondary to dehydration  -Creatinine 1.79> 0.85  -Continue IV fluids     Generalized  weakness/failure to thrive; patient lives at home alone  -PT OT consults     Chronic L2 fracture  -CT of the abdomen revealed chronic appearing fracture of the upper endplate of L2  -Continue to  monitor and consider MRI if necessary     History of COPD without supplemental oxygen needed at home  -Continue home medication     History of HTN  -Continue medication     Medical Decision Making:   I personally reviewed labs: CBC CMP lactic CRP UA  I personally reviewed imaging: CT of the abdomen pelvis and CXR  Toxic drug monitoring: Adverse reaction antibiotic, heparin for any signs bleeding, monitor PTT  Discussed case with: Patient           Code Status: DNR  DVT Prophylaxis: Heparin  GI Prophylaxis: Protonix     Subjective:      Chief Complaint / Reason for Physician Visit  Patient evaluated bedside and reports improvement overall status as of this morning.  Will collect stool cultures after patient's next bowel movement.  She denies any chest pain, short of breath, nausea, vomiting, abdominal pain or any other symptoms this time discussed with RN events overnight.         Objective:      VITALS:   Last 24hrs VS reviewed since prior progress note. Most recent are:  Patient Vitals for the past 24 hrs:    BP Temp Temp src Pulse Resp SpO2 Weight   10/19/21 1100 131/84 97.3 F (36.3 C) Oral 76 -- 100 % --   10/19/21 0715 128/86 98.2 F (36.8 C) Oral 85 -- 100 % --   10/19/21 0600 -- -- -- -- -- -- 41.7 kg (91 lb 14.9 oz)   10/19/21 0306 (!) 150/87 98.1 F (36.7 C) Oral 92 18 98 % --   10/18/21 2104 (!) 156/83 -- -- 96 -- -- --   10/18/21 2103 -- -- -- -- -- 96 % --   10/18/21 2027 (!) 156/83 99.1 F (37.3 C) Oral 96 17 100 % --   10/18/21 1900 (!) 161/89 -- -- 99 20 95 % --   10/18/21 1830 (!) 153/96 -- -- 100 24 -- --   10/18/21 1807 (!) 160/91 97.9 F (36.6 C) Oral (!) 101 23 95 % --   10/18/21 1500 (!) 156/103 -- -- 100 20 -- --   10/18/21 1445 (!) 157/134 -- -- (!) 105 26 -- --   10/18/21 1430 (!) 169/125 -- --  (!) 103 25 94 % --   10/18/21 1400 (!) 167/100 -- -- (!) 103 18 -- --   10/18/21 1345 -- -- -- -- -- 96 % --   10/18/21 1330 (!) 162/96 -- -- (!) 101 22 -- --            Intake/Output Summary (Last 24 hours) at 10/19/2021 1215  Last data filed at 10/19/2021 0600      Gross per 24 hour   Intake 1220.07 ml   Output 350 ml   Net 870.07 ml         I had a face to face encounter and independently examined this patient on 10/19/2021, as outlined below:     Review of Systems   Constitutional:  Positive for fatigue. Negative for activity change, appetite change, chills and fever.   HENT:  Negative for congestion, rhinorrhea, sore throat and trouble swallowing.    Eyes: Negative.    Respiratory:  Negative for cough, shortness of breath and wheezing.    Cardiovascular:  Negative for chest pain, palpitations and leg swelling.   Gastrointestinal:  Positive for diarrhea. Negative for abdominal pain, nausea and vomiting.   Endocrine: Negative.    Genitourinary:  Negative for dysuria and hematuria.   Musculoskeletal:  Negative for back pain, neck pain and neck stiffness.   Skin: Negative.    Allergic/Immunologic: Negative.    Neurological:  Positive for weakness. Negative for dizziness, numbness and headaches.   Hematological: Negative.    Psychiatric/Behavioral: Negative.     All other systems reviewed and are negative.        PHYSICAL EXAM:  Physical Exam  Vitals reviewed.   Constitutional:       General: She is not in acute distress.     Appearance: She is not ill-appearing.      Comments: Thin build   HENT:      Head: Normocephalic.   Eyes:      Extraocular Movements: Extraocular movements intact.   Cardiovascular:      Rate and Rhythm: Normal rate and regular rhythm.      Heart sounds: Normal heart sounds.   Pulmonary:      Effort: Pulmonary effort is normal.      Breath sounds: Normal breath sounds.   Abdominal:      General: Abdomen is flat.      Palpations: Abdomen is soft.      Tenderness: There is no abdominal tenderness.    Musculoskeletal:         General: Tenderness present. Normal range of motion.      Cervical back: Normal range of motion. No tenderness.      Right lower leg: No edema.      Left lower leg: No edema.   Neurological:      Mental Status: She is alert and oriented to person, place, and time.            Reviewed most current lab test results and cultures  YES  Reviewed most current radiology test results   YES  Review and summation of old records today    NO  Reviewed patient's current orders and MAR    YES  PMH/SH reviewed - no change compared to H&P  ________________________________________________________________________  Care Plan discussed with:      Comments   Patient x     Family        RN x     Care Manager       Consultant                           Multidiciplinary team rounds were held today with case manager, nursing, pharmacist and Higher education careers adviserclinical coordinator.  Patient's plan of care was discussed; medications were reviewed and discharge planning was addressed.     ________________________________________________________________________  Total NON critical care TIME:  35  Minutes     Total CRITICAL CARE TIME Spent:   Minutes non procedure based         Comments   >50% of visit spent in counseling and coordination of care       ________________________________________________________________________  Chales SalmonAustin Baroni, PA-C      Procedures: see electronic medical records for all procedures/Xrays and details which were not copied into this note but were reviewed prior to creation of Plan.       LABS:  I reviewed today's most current labs and imaging studies.  Pertinent labs include:       Recent Labs     10/18/21  1122 10/19/21  0632   WBC 21.7* 16.9*   HGB 13.9 11.2*   HCT 43.7 35.5   PLT 524* 347  Recent Labs     10/18/21  1122 10/19/21  0632   NA 144 143   K Hemolyzed, Recollection Recommended 3.8   CL 109* 110*   CO2 24 25   BUN 42* 32*   ALT 16  --          Signed: Rolla Flatten, PA-C                Cosigned by: Kathlynn Grate, MD at 10/19/2021 12:38 PM   Gwenlyn Fudge, MD  Physician  Internal Medicine  Progress Notes      Signed  Date of Service:  10/19/2021  1:40 PM     Signed        Expand All Collapse All                                                                                                                                                                                                                                                Progress Note     Patient: [name] MRN: [id number]  SSN: [id number]    Date of Birth: 07-Aug-1945  Age: 76 y.o.  Sex: female       Admit Date: 10/18/2021    LOS: 1 day      Subjective:      Patient continues to have diarrhea and C. difficile results are still pending denies any fever or chills her white cell count is still elevated     Objective:      Vitals          Vitals:     10/19/21 0600 10/19/21 0715 10/19/21 0730 10/19/21 1100   BP:   128/86 131/84 131/84   Pulse:   85 76 76   Resp:     18     Temp:   98.2 F (36.8 C) 97.3 F (36.3 C) 97.3 F (36.3 C)   TempSrc:   Oral Oral Oral   SpO2:   100%   100%   Weight: 41.7 kg (91 lb 14.9 oz)         Height:                    Intake and Output:  Current Shift: No intake/output data recorded.  Last three shifts: 09/25 1901 - 09/27 0700  In: 1220.1 [P.O.:120]  Out: 350 [  Urine:350]     Physical Exam:   Skin:  Extremities and face reveal no rashes. No palmer erythema.   HEENT: Sclerae anicteric. Extra-occular muscles are intact. No abnormal pigmentation of the lips. The neck is supple.  Cardiovascular: Regular rate and rhythm.  Respiratory:  Comfortable breathing with no accessory muscle use.   GI:  Abdomen nondistended, soft, and nontender. No enlargement of the liver or spleen. No masses palpable.  Rectal:  Deferred  Musculoskeletal: Extremities have good range of motion.  Neurological:  Gross memory appears intact.  Patient is alert and oriented.  Psychiatric:  Mood appears appropriate with judgement intact.  Lymphatic:   No visible adenopathy        Lab/Data Review:  Recent Results         Recent Results (from the past 24 hour(s))   Urinalysis with Reflex to Culture     Collection Time: 10/18/21  1:51 PM     Specimen: Urine   Result Value Ref Range     Color, UA Amber       Appearance Clear Clear       Specific Gravity, UA >1.030 (H) 1.003 - 1.030     pH, Urine 5.0 5.0 - 8.0       Protein, UA 30 (A) Negative mg/dL     Glucose, UA Negative Negative mg/dL     Ketones, Urine 5 (A) Negative mg/dL     Bilirubin Urine Negative Negative       Blood, Urine Small (A) Negative       Urobilinogen, Urine 4.0 (H) 0.1 - 1.0 EU/dL     Nitrite, Urine Negative Negative       Leukocyte Esterase, Urine Moderate (A) Negative       WBC, UA 50-100 0 - 4 /hpf     RBC, UA 0-5 0 - 5 /hpf     Epithelial Cells UA Moderate (A) Few /lpf     BACTERIA, URINE 4+ (A) Negative /hpf     Urine Culture if Indicated Urine Culture Ordered (A) Culture not indicated by UA result       Mucus, UA Trace (A) Negative /lpf     Hyaline Casts, UA 2-5 0 - 5 /lpf   POC Lactic Acid     Collection Time: 10/18/21  2:38 PM   Result Value Ref Range     POC Lactic Acid 2.64 (HH) 0.40 - 2.00 mmol/L     Performed by: Myrtha Mantis     Lactic Acid     Collection Time: 10/18/21  8:36 PM   Result Value Ref Range     Lactic Acid, Plasma 1.8 0.4 - 2.0 mmol/L   Basic Metabolic Panel w/ Reflex to MG     Collection Time: 10/19/21  6:32 AM   Result Value Ref Range     Sodium 143 136 - 145 mmol/L     Potassium 3.8 3.5 - 5.1 mmol/L     Chloride 110 (H) 97 - 108 mmol/L     CO2 25 21 - 32 mmol/L     Anion Gap 8 5 - 15 mmol/L     Glucose 89 65 - 100 mg/dL     BUN 32 (H) 6 - 20 mg/dL     Creatinine 1.61 0.96 - 1.02 mg/dL     Bun/Cre Ratio 38 (H) 12 - 20       Est, Glom Filt Rate >60 >60 ml/min/1.63m2     Calcium 8.8 8.5 - 10.1 mg/dL  Lactic Acid     Collection Time: 10/19/21  6:32 AM   Result Value Ref Range     Lactic Acid, Plasma 0.8 0.4 - 2.0 mmol/L   CBC with Auto Differential     Collection  Time: 10/19/21  6:32 AM   Result Value Ref Range     WBC 16.9 (H) 3.6 - 11.0 K/uL     RBC 3.87 3.80 - 5.20 M/uL     Hemoglobin 11.2 (L) 11.5 - 16.0 g/dL     Hematocrit 16.1 09.6 - 47.0 %     MCV 91.7 80.0 - 99.0 FL     MCH 28.9 26.0 - 34.0 PG     MCHC 31.5 30.0 - 36.5 g/dL     RDW 04.5 (H) 40.9 - 14.5 %     Platelets 347 150 - 400 K/uL     MPV 8.9 8.9 - 12.9 FL     Nucleated RBCs 0.0 0.0 PER 100 WBC     nRBC 0.00 0.00 - 0.01 K/uL     Neutrophils % 88 (H) 32 - 75 %     Lymphocytes % 6 (L) 12 - 49 %     Monocytes % 6 5 - 13 %     Eosinophils % 0 0 - 7 %     Basophils % 0 0 - 1 %     Immature Granulocytes 0 %     Neutrophils Absolute 14.9 (H) 1.8 - 8.0 K/UL     Lymphocytes Absolute 1.0 0.8 - 3.5 K/UL     Monocytes Absolute 1.0 0.0 - 1.0 K/UL     Eosinophils Absolute 0.0 0.0 - 0.4 K/UL     Basophils Absolute 0.0 0.0 - 0.1 K/UL     Absolute Immature Granulocyte 0.0 K/UL     Differential Type Smear Scanned       RBC Comment Normocytic, Normochromic     C-Reactive Protein     Collection Time: 10/19/21  6:32 AM   Result Value Ref Range     CRP 9.01 (H) 0.00 - 0.60 mg/dL                     CT ABDOMEN PELVIS W IV CONTRAST Additional Contrast? None   Final Result       1. Long segment wall thickening of the descending and sigmoid colon consistent   with infectious or inflammatory colitis.       2. No free fluid or fluid collections.       3. Chronic appearing fracture of the upper endplate of L2.       XR CHEST PORTABLE   Final Result       No acute process on portable chest.                 Assessment:      Principal Problem:    Colitis  Resolved Problems:    * No resolved hospital problems. *     Infectious enterocolitis stool studies are pending  Plan:   Will add Flagyl 500 mg IV every 8 pending results of stool studies  Thank you for allowing me to participate in this patients care     Signed By: Era Bumpers, MD      October 19, 2021                    Chales Salmon, PA-C  Physician Assistant  Internal  Medicine  Progress Notes  Cosign Needed  Date of Service:  10/20/2021 12:34 PM     Cosign Needed                                                                                                                                                                                                                                      Hospitalist Progress Note     NAME:              Bayleigh Loflin   DOB:   [birth year]   MRN:   [id number]      Date/Time: 10/20/2021 12:34 PM  Patient PCP: Vevelyn Francois, APRN - NP     Estimated discharge date: 24 hours  Barriers: SNF placement     Hospital course:  Patient is a 76 year old female with past medical history of COPD, HLD, HTN and chronic L2 fracture who was admitted on 10/18/2021 with generalized weakness and multiple episodes of diarrhea.  CT of the abdomen pelvis revealed long segment of wall thickening of descending and sigmoid colon consistent with infectious or inflammatory colitis.  No free fluid or fluid collection.  GI consulted recommended stool culture collection.  UA admission revealed moderate leukoesterase with 4+ bacteria and 50-100 WBC.  Patient initiated on Zosyn.  Blood cultures pending.  Urine culture revealed gram-negative rods, continue IV Zosyn.  GI consulted and evaluated patient recommending stool cultures.  Also added Flagyl for coverage.  C. difficile negative, will transition patient to p.o. Cipro upon discharge.  PT OT evaluate patient recommended SNF placement.     Assessment / Plan:  Acute colitis  -CT abdomen pelvis revealed long segment of wall thickening of descending and sigmoid colon consistent with infectious or inflammatory colitis.  No free fluid or fluid collection  -WBC 21.7>16.9  -Lactic 2.4>0.8, without ischemia  -CRP 9.01>5.30  -Pro-Cal pending  -Blood cultures pending  -Continue full liquid diet  -IV fluids  -C. difficile negative  -Enteric bacterial panel pending  -GI consulted and recommended stool cultures  -Continue IV Zosyn and Flagyl,  will transition to p.o. Cipro upon discharge     UTI  -UA revealed moderate leukoesterase with 4+ bacteria and 50-100 WBC  -WBC 21.7>16.9>11.5  -Urine cultures revealed gram-negative rods  -Continue IV Zosyn     AKI likely secondary to dehydration  -Creatinine 1.79> 0.85>0.72  -Continue IV fluids     Generalized weakness/failure to thrive; patient lives  at home alone  -PT and OT evaluated patient recommend SNF placement     Chronic L2 fracture  -CT of the abdomen revealed chronic appearing fracture of the upper endplate of L2  -Continue to monitor and consider MRI if necessary     History of COPD without supplemental oxygen needed at home  -Continue home medication     History of HTN  -Continue medication     Medical Decision Making:   I personally reviewed labs: CBC BMP CRP urine culture C. difficile  I personally reviewed imaging: None  Toxic drug monitoring: Adverse reaction antibiotic, heparin for any signs bleeding, monitor PTT  Discussed case with: Patient           Code Status: DNR  DVT Prophylaxis: Heparin  GI Prophylaxis: Protonix     Subjective:      Chief Complaint / Reason for Physician Visit  Patient evaluated bedside this morning eating breakfast.  She reports overall improvement after having large stool disimpacted yesterday. She denies any chest pain, short of breath, nausea, vomiting, abdominal pain or any other symptoms this time discussed with RN events overnight.         Objective:      VITALS:   Last 24hrs VS reviewed since prior progress note. Most recent are:  Patient Vitals for the past 24 hrs:    BP Temp Temp src Pulse Resp SpO2 Weight   10/20/21 1053 -- -- -- -- -- 99 % --   10/20/21 0757 131/77 97.9 F (36.6 C) Oral 66 -- 100 % --   10/20/21 0730 131/77 97.9 F (36.6 C) Oral 66 18 -- --   10/20/21 0600 -- -- -- -- -- -- 41.7 kg (91 lb 14 oz)   10/20/21 0346 112/71 97.9 F (36.6 C) Oral 68 17 100 % --   10/19/21 2000 114/72 98.1 F (36.7 C) Oral 82 18 99 % --   10/19/21 1445 115/73 97.4  F (36.3 C) Oral 62 -- 92 % --               Intake/Output Summary (Last 24 hours) at 10/20/2021 1234  Last data filed at 10/20/2021 0507      Gross per 24 hour   Intake 850 ml   Output 300 ml   Net 550 ml            I had a face to face encounter and independently examined this patient on 10/20/2021, as outlined below:     Review of Systems   Constitutional:  Positive for fatigue. Negative for activity change, appetite change, chills and fever.   HENT:  Negative for congestion, rhinorrhea, sore throat and trouble swallowing.    Eyes: Negative.    Respiratory:  Negative for cough, shortness of breath and wheezing.    Cardiovascular:  Negative for chest pain, palpitations and leg swelling.   Gastrointestinal:  Negative for abdominal pain, diarrhea, nausea and vomiting.   Endocrine: Negative.    Genitourinary:  Negative for dysuria and hematuria.   Musculoskeletal:  Negative for back pain, neck pain and neck stiffness.   Skin: Negative.    Allergic/Immunologic: Negative.    Neurological:  Positive for weakness. Negative for dizziness, numbness and headaches.   Hematological: Negative.    Psychiatric/Behavioral: Negative.     All other systems reviewed and are negative.        PHYSICAL EXAM:  Physical Exam  Vitals reviewed.   Constitutional:       General:  She is not in acute distress.     Appearance: She is not ill-appearing.      Comments: Thin build   HENT:      Head: Normocephalic.   Eyes:      Extraocular Movements: Extraocular movements intact.   Cardiovascular:      Rate and Rhythm: Normal rate and regular rhythm.      Heart sounds: Normal heart sounds.   Pulmonary:      Effort: Pulmonary effort is normal.      Breath sounds: Normal breath sounds.   Abdominal:      General: Abdomen is flat.      Palpations: Abdomen is soft.      Tenderness: There is no abdominal tenderness.   Musculoskeletal:         General: No tenderness. Normal range of motion.      Cervical back: Normal range of motion. No tenderness.       Right lower leg: No edema.      Left lower leg: No edema.   Neurological:      Mental Status: She is alert and oriented to person, place, and time.            Reviewed most current lab test results and cultures  YES  Reviewed most current radiology test results   YES  Review and summation of old records today    NO  Reviewed patient's current orders and MAR    YES  PMH/SH reviewed - no change compared to H&P  ________________________________________________________________________  Care Plan discussed with:      Comments   Patient x     Family        RN x     Care Manager       Consultant                           Multidiciplinary team rounds were held today with case manager, nursing, pharmacist and Higher education careers adviser.  Patient's plan of care was discussed; medications were reviewed and discharge planning was addressed.     ________________________________________________________________________  Total NON critical care TIME:  35  Minutes     Total CRITICAL CARE TIME Spent:   Minutes non procedure based         Comments   >50% of visit spent in counseling and coordination of care       ________________________________________________________________________  Chales Salmon, PA-C      Procedures: see electronic medical records for all procedures/Xrays and details which were not copied into this note but were reviewed prior to creation of Plan.       LABS:  I reviewed today's most current labs and imaging studies.  Pertinent labs include:        Recent Labs     10/18/21  1122 10/19/21  0632 10/20/21  0533   WBC 21.7* 16.9* 11.5*   HGB 13.9 11.2* 9.8*   HCT 43.7 35.5 31.3*   PLT 524* 347 275               Recent Labs     10/18/21  1122 10/19/21  0632 10/20/21  0533   NA 144 143 142   K Hemolyzed, Recollection Recommended 3.8 3.5   CL 109* 110* 114*   CO2 24 25 22    BUN 42* 32* 25*   ALT 16  --   --  Signed: Chales Salmon, PA-C             Era Bumpers, MD  Physician  Internal Medicine  Progress Notes       Signed  Date of Service:  10/20/2021  5:05 PM     Signed        Expand All Collapse All                                                                                                                                                                                                                                                Progress Note     Patient: [name] MRN: [id number]  SSN: [id number]    Date of Birth: 1945/05/21  Age: 76 y.o.  Sex: female       Admit Date: 10/18/2021    LOS: 2 days      Subjective:      Patient is feeling somewhat better she had a large solid bowel movement yesterday.  Her C. difficile is negative.  Her white cell count is coming down     Objective:      Vitals          Vitals:     10/20/21 0757 10/20/21 1053 10/20/21 1308 10/20/21 1435   BP: 131/77     105/69   Pulse: 66     65   Resp:       14   Temp: 97.9 F (36.6 C)     98.2 F (36.8 C)   TempSrc: Oral     Oral   SpO2: 100% 99%   97%   Weight:           Height:     1.6 m ( )              Intake and Output:  Current Shift: No intake/output data recorded.  Last three shifts: 09/26 1901 - 09/28 0700  In: 970 [P.O.:320; I.V.:600]  Out: 650 [Urine:650]     Physical Exam:   Skin:  Extremities and face reveal no rashes. No palmer erythema.   HEENT: Sclerae anicteric. Extra-occular muscles are intact. No abnormal pigmentation of the lips. The neck is supple.  Cardiovascular: Regular rate and rhythm.  Respiratory:  Comfortable breathing with no accessory muscle use.   GI:  Abdomen nondistended, soft, and nontender. No enlargement  of the liver or spleen. No masses palpable.  Rectal:  Deferred  Musculoskeletal: Extremities have good range of motion.  Neurological:  Gross memory appears intact.  Patient is alert and oriented.  Psychiatric:  Mood appears appropriate with judgement intact.  Lymphatic:  No visible adenopathy        Lab/Data Review:  Recent Results         Recent Results (from the past 24 hour(s))   CBC with Auto Differential      Collection Time: 10/20/21  5:33 AM   Result Value Ref Range     WBC 11.5 (H) 3.6 - 11.0 K/uL     RBC 3.33 (L) 3.80 - 5.20 M/uL     Hemoglobin 9.8 (L) 11.5 - 16.0 g/dL     Hematocrit 16.1 (L) 35.0 - 47.0 %     MCV 94.0 80.0 - 99.0 FL     MCH 29.4 26.0 - 34.0 PG     MCHC 31.3 30.0 - 36.5 g/dL     RDW 09.6 (H) 04.5 - 14.5 %     Platelets 275 150 - 400 K/uL     MPV 8.8 (L) 8.9 - 12.9 FL     Nucleated RBCs 0.0 0.0 PER 100 WBC     nRBC 0.00 0.00 - 0.01 K/uL     Neutrophils % 69 32 - 75 %     Lymphocytes % 21 12 - 49 %     Monocytes % 6 5 - 13 %     Eosinophils % 4 0 - 7 %     Basophils % 0 0 - 1 %     Immature Granulocytes 0 0 - 0.5 %     Neutrophils Absolute 8.0 1.8 - 8.0 K/UL     Lymphocytes Absolute 2.4 0.8 - 3.5 K/UL     Monocytes Absolute 0.6 0.0 - 1.0 K/UL     Eosinophils Absolute 0.4 0.0 - 0.4 K/UL     Basophils Absolute 0.0 0.0 - 0.1 K/UL     Absolute Immature Granulocyte 0.1 (H) 0.00 - 0.04 K/UL     Differential Type AUTOMATED     Basic Metabolic Panel     Collection Time: 10/20/21  5:33 AM   Result Value Ref Range     Sodium 142 136 - 145 mmol/L     Potassium 3.5 3.5 - 5.1 mmol/L     Chloride 114 (H) 97 - 108 mmol/L     CO2 22 21 - 32 mmol/L     Anion Gap 6 5 - 15 mmol/L     Glucose 81 65 - 100 mg/dL     BUN 25 (H) 6 - 20 mg/dL     Creatinine 4.09 8.11 - 1.02 mg/dL     Bun/Cre Ratio 35 (H) 12 - 20       Est, Glom Filt Rate >60 >60 ml/min/1.46m2     Calcium 7.6 (L) 8.5 - 10.1 mg/dL   C-Reactive Protein     Collection Time: 10/20/21  5:33 AM   Result Value Ref Range     CRP 5.30 (H) 0.00 - 0.60 mg/dL                     CT ABDOMEN PELVIS W IV CONTRAST Additional Contrast? None   Final Result       1. Long segment wall thickening of the descending and sigmoid colon consistent   with infectious or inflammatory colitis.  2. No free fluid or fluid collections.       3. Chronic appearing fracture of the upper endplate of L2.       XR CHEST PORTABLE   Final Result       No acute process on portable chest.                  Assessment:      Principal Problem:    Colitis  Resolved Problems:    * No resolved hospital problems. *     Infectious enterocolitis  Plan:   Ciprofloxacin and Flagyl for 5 days  Atrial fibrillation follow-up as an outpatient  Thank you for allowing me to participate in this patients care     Signed By: Era Bumpers, MD      October 20, 2021

## 2021-10-21 NOTE — Plan of Care (Signed)
Problem: ABCDS Injury Assessment  Goal: Absence of physical injury  Outcome: Progressing  Note: Pt's room and pathway free of debris and clutter.      Problem: Skin/Tissue Integrity  Goal: Absence of new skin breakdown  Description: 1.  Monitor for areas of redness and/or skin breakdown  2.  Assess vascular access sites hourly  3.  Every 4-6 hours minimum:  Change oxygen saturation probe site  4.  Every 4-6 hours:  If on nasal continuous positive airway pressure, respiratory therapy assess nares and determine need for appliance change or resting period.  Outcome: Progressing  Note:  Pt TQ2H. Bilat heels elevated off bed. PRN incontinent care performed.  Pt able to perform bed mobility and reposition self in bed with encouragement.      Problem: Discharge Planning  Goal: Discharge to home or other facility with appropriate resources  Outcome: Progressing  Note: Per case management notes, waiting for authorization for [facility].      Problem: Pain  Goal: Verbalizes/displays adequate comfort level or baseline comfort level  Outcome: Progressing  Note: Pt denies pain from 1900-current.     Problem: Safety - Adult  Goal: Free from fall injury  Outcome: Progressing  Note: Pt's bed in low position and locked. Bed alarm activated. NS call light and phone w/in reach.      Problem: Physical Therapy - Adult  Goal: By Discharge: Performs mobility at highest level of function for planned discharge setting.  See evaluation for individualized goals.  Description: FUNCTIONAL STATUS PRIOR TO ADMISSION: Patient was modified independent using a single point cane for functional mobility.    HOME SUPPORT PRIOR TO ADMISSION: The patient lived alone with friends to provide assistance.    Physical Therapy Goals  Initiated 10/19/2021  Pt stated goal: get better  Pt will be I with LE HEP in 7 days.  Pt will perform bed mobility with Minimal Assist in 7 days.  Pt will perform transfers with Minimal Assist in 7 days.   Pt will amb 40  feet with LRAD safely with Minimal Assist in 7 days.    Pt will verbalize and demonstrate compliance with fall precautions   in 7 days.   Pt will demonstrate improvement in standing  balance from poor to fair in 7 days.    10/20/2021 1639 by Oneita Kras, PTA  Outcome: Progressing

## 2021-10-21 NOTE — Plan of Care (Signed)
Problem: Physical Therapy - Adult  Goal: By Discharge: Performs mobility at highest level of function for planned discharge setting.  See evaluation for individualized goals.  Description: FUNCTIONAL STATUS PRIOR TO ADMISSION: Patient was modified independent using a single point cane for functional mobility.    HOME SUPPORT PRIOR TO ADMISSION: The patient lived alone with friends to provide assistance.    Physical Therapy Goals  Initiated 10/19/2021  Pt stated goal: get better  Pt will be I with LE HEP in 7 days.  Pt will perform bed mobility with Minimal Assist in 7 days.  Pt will perform transfers with Minimal Assist in 7 days.   Pt will amb 40 feet with LRAD safely with Minimal Assist in 7 days.    Pt will verbalize and demonstrate compliance with fall precautions   in 7 days.   Pt will demonstrate improvement in standing  balance from poor to fair in 7 days.    10/21/2021 0924 by Oneita Kras, PTA  Outcome: Progressing     Problem: ABCDS Injury Assessment  Goal: Absence of physical injury  10/21/2021 1544 by Duanne Limerick, RN  Outcome: Adequate for Discharge  10/21/2021 0557 by Ave Filter, LPN  Outcome: Progressing  Note: Pt's room and pathway free of debris and clutter.      Problem: Skin/Tissue Integrity  Goal: Absence of new skin breakdown  Description: 1.  Monitor for areas of redness and/or skin breakdown  2.  Assess vascular access sites hourly  3.  Every 4-6 hours minimum:  Change oxygen saturation probe site  4.  Every 4-6 hours:  If on nasal continuous positive airway pressure, respiratory therapy assess nares and determine need for appliance change or resting period.  10/21/2021 1544 by Duanne Limerick, RN  Outcome: Adequate for Discharge  10/21/2021 0557 by Ave Filter, LPN  Outcome: Progressing  Note:  Pt TQ2H. Bilat heels elevated off bed. PRN incontinent care performed.  Pt able to perform bed mobility and reposition self in bed with encouragement.      Problem: Discharge Planning  Goal: Discharge to  home or other facility with appropriate resources  10/21/2021 1544 by Duanne Limerick, RN  Outcome: Adequate for Discharge  Flowsheets (Taken 10/21/2021 0730)  Discharge to home or other facility with appropriate resources:   Identify barriers to discharge with patient and caregiver   Arrange for needed discharge resources and transportation as appropriate   Identify discharge learning needs (meds, wound care, etc)  10/21/2021 0557 by Ave Filter, LPN  Outcome: Progressing  Note: Per case management notes, waiting for authorization for [facility].      Problem: Pain  Goal: Verbalizes/displays adequate comfort level or baseline comfort level  10/21/2021 1544 by Duanne Limerick, RN  Outcome: Adequate for Discharge  10/21/2021 0557 by Ave Filter, LPN  Outcome: Progressing  Note: Pt denies pain from 1900-current.     Problem: Safety - Adult  Goal: Free from fall injury  10/21/2021 1544 by Duanne Limerick, RN  Outcome: Adequate for Discharge  10/21/2021 0557 by Ave Filter, LPN  Outcome: Progressing  Note: Pt's bed in low position and locked. Bed alarm activated. NS call light and phone w/in reach.

## 2021-10-21 NOTE — Progress Notes (Signed)
Physician Progress Note      PATIENT:               [name], [name]  CSN #:                  [id number]  DOB:                       1945-12-12  ADMIT DATE:       10/18/2021 10:35 AM  DISCH DATE:  Earnstine Regal  PROVIDER #:        Rolla Flatten          QUERY TEXT:    [name],    Pt admitted with Infectious colitis. Pt noted to have HR 100-115, RR 20-26,   WBC 21.7, CRP 7.96, LA 2.64. If possible, please document in the progress   notes and discharge summary if you are evaluating and /or treating any of the   following:    The medical record reflects the following:  Risk Factors: 76 year old-female, dehydration, COPD, HTN.    Clinical Indicators: HR 100-115, RR 20-26, WBC 21.7, CRP 7.96, LA 2.64, per ED   provider note 09/26 Infectious colitis, per H&P 09/26 Acute colitis,   suspected UTI, clinical dehydration, AKI.    Treatment: IV Zosyn, IV hydration, GI consult, follow cultures, lab workup.  .  Thank you,  Aundra Millet, BSN, RN, CRCR, CDI Specialist  [email or web address]  Can also be reached on [employer].  Marland Kitchen  Options provided:  -- Sepsis due to Infectious colitis, present on admission  -- Infectious colitis without Sepsis  -- Other - I will add my own diagnosis  -- Disagree - Not applicable / Not valid  -- Disagree - Clinically unable to determine / Unknown  -- Refer to Clinical Documentation Reviewer    PROVIDER RESPONSE TEXT:    This patient has sepsis due to Infectious colitis which was present on   admission.    Query created by: Aundra Millet on 10/19/2021 8:10 AM      Electronically signed by:  Rolla Flatten 10/21/2021 8:16 AM

## 2021-10-22 LAB — CULTURE, BLOOD 1

## 2021-10-24 LAB — CULTURE, BLOOD 2: Culture: NO GROWTH

## 2021-11-14 ENCOUNTER — Emergency Department: Admit: 2021-11-15 | Payer: MEDICARE | Primary: Family

## 2021-11-14 ENCOUNTER — Inpatient Hospital Stay
Admission: EM | Admit: 2021-11-14 | Discharge: 2021-11-21 | Disposition: A | Discharge status: Skilled Nursing Facility | Payer: MEDICARE | Attending: Internal Medicine | Admitting: Internal Medicine

## 2021-11-14 ENCOUNTER — Emergency Department: Admit: 2021-11-14 | Payer: MEDICARE | Primary: Family

## 2021-11-14 DIAGNOSIS — R197 Diarrhea, unspecified: Secondary | ICD-10-CM

## 2021-11-14 DIAGNOSIS — A0472 Enterocolitis due to Clostridium difficile, not specified as recurrent: Secondary | ICD-10-CM

## 2021-11-14 NOTE — ED Triage Notes (Signed)
Patient arrived via EMS from [facility]. Patient states she hasn't been feeling well for months and no one can find out what is wrong with her. Patient is being sent here for possible c-diff and they are worried about sepsis. Patient has a wet cough.

## 2021-11-14 NOTE — ED Provider Notes (Signed)
[facility] EMERGENCY DEPT  EMERGENCY DEPARTMENT HISTORY AND PHYSICAL EXAM      Date: 11/14/2021  Patient Name: [name]  MRN: [id number]  Simms [birth year]  Date of evaluation: 11/14/2021  Provider: Bedelia Person, MD   Note Started: 10:36 PM EDT 11/14/21    HISTORY OF PRESENT ILLNESS     Chief Complaint   Patient presents with    Fatigue       History Provided By: Patient    HPI: Stacia Feazell is a 76 y.o. female past medical history of COPD, hypercholesterolemia presents for evaluation of generalized fatigue.  Patient states she has been feeling fatigued for 2 years and no one has been able to diagnose why but today her fatigue seems worse.  She initially denied nausea vomiting and diarrhea to me however per report patient's been having diarrhea at her facility.  They are concerned for C. difficile.    PAST MEDICAL HISTORY   Past Medical History:  Past Medical History:   Diagnosis Date    Chronic obstructive pulmonary disease ([place])     Hypercholesteremia     Insomnia     Sleep disorder        Past Surgical History:  Past Surgical History:   Procedure Laterality Date    CT GUIDED CHEST TUBE  12/06/2018    CT GUIDED CHEST TUBE    ORTHOPEDIC SURGERY  05/26/2021    LEFT HIP REVISION POSTERIOR APPROACH WITH ALLOGRAFT    PARTIAL HYSTERECTOMY (CERVIX NOT REMOVED)      SHOULDER SURGERY Left        Family History:  No family history on file.    Social History:  Social History     Tobacco Use    Smoking status: Former     Packs/day: 1     Types: Cigarettes     Quit date: 01/24/2019     Years since quitting: 2.8    Smokeless tobacco: Never   Substance Use Topics    Alcohol use: Yes     Alcohol/week: 1.0 standard drink of alcohol    Drug use: Never       Allergies:  Allergies   Allergen Reactions    Cheese Nausea And Vomiting     Severe vomiting       PCP: Rigoberto Noel, APRN - NP    Current Meds:   No current facility-administered medications for this encounter.     Current Outpatient Medications   Medication Sig Dispense Refill     metoprolol succinate (TOPROL XL) 25 MG extended release tablet Take 0.5 tablets by mouth in the morning and at bedtime 30 tablet 3    atorvastatin (LIPITOR) 40 MG tablet Take 1 tablet by mouth daily      aspirin 81 MG EC tablet Take 1 tablet by mouth daily 30 tablet 3    albuterol sulfate HFA (PROVENTIL;VENTOLIN;PROAIR) 108 (90 Base) MCG/ACT inhaler albuterol sulfate HFA 90 mcg/actuation aerosol inhaler   INHALE 2 PUFFS EVERY 6 HOURS AS NEEDED      fluticasone-umeclidin-vilant (TRELEGY ELLIPTA) 200-62.5-25 MCG/ACT AEPB inhaler Trelegy Ellipta 200 mcg-62.5 mcg-25 mcg powder for inhalation   INHALE 1 PUFF BY MOUTH ONCE A DAY      omeprazole (PRILOSEC) 20 MG delayed release capsule Take 1 capsule by mouth daily      polyethylene glycol (GLYCOLAX) 17 GM/SCOOP powder Take 17 g by mouth daily         Social Determinants of Health:   Social Determinants of Health  Tobacco Use: Medium Risk (11/14/2021)    Patient History     Smoking Tobacco Use: Former     Smokeless Tobacco Use: Never     Passive Exposure: Not on file   Alcohol Use: Not At Risk (11/14/2021)    AUDIT-C     Frequency of Alcohol Consumption: Never     Average Number of Drinks: Patient does not drink     Frequency of Binge Drinking: Never   Financial Resource Strain: Not on file   Food Insecurity: Not on file   Transportation Needs: Not on file   Physical Activity: Not on file   Stress: Not on file   Social Connections: Not on file   Intimate Partner Violence: Not on file   Depression: Not at risk (07/15/2021)    PHQ-2     PHQ-2 Score: 0   Housing Stability: Not on file       PHYSICAL EXAM   Physical Exam  Vitals and nursing note reviewed.   Constitutional:       General: She is not in acute distress.     Appearance: Normal appearance. She is normal weight. She is ill-appearing.   HENT:      Head: Normocephalic and atraumatic.      Nose: Nose normal.      Mouth/Throat:      Mouth: Mucous membranes are dry.   Eyes:      Conjunctiva/sclera: Conjunctivae  normal.   Cardiovascular:      Rate and Rhythm: Normal rate.      Pulses: Normal pulses.   Pulmonary:      Effort: Pulmonary effort is normal. No respiratory distress.   Abdominal:      Palpations: Abdomen is soft.      Tenderness: There is no abdominal tenderness. There is no guarding or rebound.   Musculoskeletal:         General: No swelling or deformity. Normal range of motion.   Neurological:      General: No focal deficit present.      Mental Status: She is alert.   Psychiatric:         Mood and Affect: Mood normal.         Behavior: Behavior normal.           SCREENINGS                   LAB, EKG AND DIAGNOSTIC RESULTS   Labs:  Recent Results (from the past 12 hour(s))   CBC with Auto Differential    Collection Time: 11/14/21  8:15 PM   Result Value Ref Range    WBC 33.7 (H) 3.6 - 11.0 K/uL    RBC 3.93 3.80 - 5.20 M/uL    Hemoglobin 11.6 11.5 - 16.0 g/dL    Hematocrit 36.3 35.0 - 47.0 %    MCV 92.4 80.0 - 99.0 FL    MCH 29.5 26.0 - 34.0 PG    MCHC 32.0 30.0 - 36.5 g/dL    RDW 14.9 (H) 11.5 - 14.5 %    Platelets 483 (H) 150 - 400 K/uL    MPV 9.1 8.9 - 12.9 FL    Nucleated RBCs 0.0 0.0 PER 100 WBC    nRBC 0.00 0.00 - 0.01 K/uL    Neutrophils % 82 (H) 32 - 75 %    Lymphocytes % 8 (L) 12 - 49 %    Monocytes % 6 5 - 13 %    Eosinophils % 2  0 - 7 %    Basophils % 1 0 - 1 %    Immature Granulocytes 1 (H) 0 - 0.5 %    Neutrophils Absolute 28.2 (H) 1.8 - 8.0 K/UL    Lymphocytes Absolute 2.6 0.8 - 3.5 K/UL    Monocytes Absolute 2.1 (H) 0.0 - 1.0 K/UL    Eosinophils Absolute 0.5 (H) 0.0 - 0.4 K/UL    Basophils Absolute 0.2 (H) 0.0 - 0.1 K/UL    Absolute Immature Granulocyte 0.3 (H) 0.00 - 0.04 K/UL    Differential Type AUTOMATED     Comprehensive Metabolic Panel    Collection Time: 11/14/21  8:15 PM   Result Value Ref Range    Sodium 137 136 - 145 mmol/L    Potassium 3.1 (L) 3.5 - 5.1 mmol/L    Chloride 104 97 - 108 mmol/L    CO2 25 21 - 32 mmol/L    Anion Gap 8 5 - 15 mmol/L    Glucose 125 (H) 65 - 100 mg/dL    BUN 32  (H) 6 - 20 mg/dL    Creatinine 0.88 0.55 - 1.02 mg/dL    Bun/Cre Ratio 36 (H) 12 - 20      Est, Glom Filt Rate >60 >60 ml/min/1.68m    Calcium 9.1 8.5 - 10.1 mg/dL    Total Bilirubin 0.3 0.2 - 1.0 mg/dL    AST 19 15 - 37 U/L    ALT 19 12 - 78 U/L    Alk Phosphatase 125 (H) 45 - 117 U/L    Total Protein 7.0 6.4 - 8.2 g/dL    Albumin 3.0 (L) 3.5 - 5.0 g/dL    Globulin 4.0 2.0 - 4.0 g/dL    Albumin/Globulin Ratio 0.8 (L) 1.1 - 2.2     Magnesium    Collection Time: 11/14/21  8:15 PM   Result Value Ref Range    Magnesium 2.1 1.6 - 2.4 mg/dL   Troponin    Collection Time: 11/14/21  8:15 PM   Result Value Ref Range    Troponin, High Sensitivity 4 0 - 51 ng/L       EKG:.EKG interpreted by me. Shows Sinus Tachycardia with a HR of 110. No STEMI.    Radiologic Studies:  Non-plain film images such as CT, Ultrasound and MRI are read by the radiologist. Plain radiographic images are visualized and preliminarily interpreted by the ED Provider with the following findings: Not Applicable.    Interpretation per the Radiologist below, if available at the time of this note:  CT ABDOMEN PELVIS W IV CONTRAST Additional Contrast? None   Final Result      1. Sigmoid diverticulosis without identified diverticulitis with no identified   cause for diarrhea.   2. Age-indeterminate L2 compression deformity with 50% height loss and posterior   retropulsed fragments moderately narrowing the canal.   3. Bilateral nonobstructing nephrolithiasis.   4. Additional incidentals as above.      XR CHEST PORTABLE   Final Result      No acute process on portable chest.              EMERGENCY DEPARTMENT COURSE and DIFFERENTIAL DIAGNOSIS/MDM   CC/HPI Summary, DDx, ED Course, and Reassessment:     Clinical Management Tools:  Not Applicable    Records Reviewed (source and summary of external notes): Prior medical records and Nursing notes    Vitals:    Vitals:    11/14/21 1950 11/14/21 2115 11/14/21 2230 11/14/21  2315   BP: (!) 146/81 (!) 158/85     Pulse: (!)  111 (!) 112 (!) 106 (!) 106   Resp: _0 Temp: 97.8 F (36.6 C)      TempSrc: Oral      SpO2: 94%  95% 94%   Weight: 40.8 kg (90 lb)      Height: 1.6 m (_1 )           ED COURSE  ED Course as of 11/14/21 2355   Mon Nov 15, 7670   3334 76 year old female presents for evaluation of fatigue.  She states she has been fatigued for years and no one has been able to diagnose why.  She denies any fever or any other associated symptoms.  Facility reports the patient is having diarrhea however patient denies this.  Denies nausea as well.  Abdominal exam benign.  Differential diagnosis includes dehydration versus electrolyte disarray versus ACS.  Getting labs including a CBC, CMP, troponin, chest x-ray.  Disposition as per clinical course. [LW]   2236 Patient having loose watery stools.  C. difficile added as well as a CT abdomen pelvis.  Blood cultures ordered as well.  Patient given IV fluids.  Disposition as per clinical course. [LW]      ED Course User Index  [LW] Bedelia Person, MD       Disposition Considerations (Tests not done, Shared Decision Making, Pt Expectation of Test or Treatment.): See ED Course    Patient was given the following medications:  Medications   sodium chloride 0.9 % bolus 1,000 mL (0 mLs IntraVENous Stopped 11/14/21 2317)   iopamidol (ISOVUE-370) 76 % injection 100 mL (100 mLs IntraVENous Given 11/14/21 2213)   potassium chloride (KLOR-CON) extended release tablet 40 mEq (40 mEq Oral Given 11/14/21 2315)       CONSULTS: (Who and What was discussed)  None     Social Determinants affecting Dx or Tx: None    Smoking Cessation: Not Applicable    PROCEDURES   Unless otherwise noted above, none  Procedures      CRITICAL CARE TIME   Patient does not meet Critical Care Time, 0 minutes    ED FINAL IMPRESSION     1. Diarrhea, unspecified type    2. Dehydration    3. Hypokalemia          DISPOSITION/PLAN   DISPOSITION      Admit Note: Pt is being admitted by Dr. Shawn Route. The results of their tests  and reason(s) for their admission have been discussed with pt and/or available family. They convey agreement and understanding for the need to be admitted and for the admission diagnosis.     I am the Primary Clinician of Record. Bedelia Person, MD (electronically signed)    (Please note that parts of this dictation were completed with voice recognition software. Quite often unanticipated grammatical, syntax, homophones, and other interpretive errors are inadvertently transcribed by the computer software. Please disregards these errors. Please excuse any errors that have escaped final proofreading.)        Bedelia Person, MD  11/14/21 2356

## 2021-11-15 LAB — TROPONIN: Troponin, High Sensitivity: 4 ng/L (ref 0–51)

## 2021-11-15 LAB — EKG 12-LEAD
Atrial Rate: 110 {beats}/min
P Axis: 55 degrees
P-R Interval: 152 ms
Q-T Interval: 334 ms
QRS Duration: 72 ms
QTc Calculation (Bazett): 452 ms
R Axis: 1 degrees
T Axis: 45 degrees
Ventricular Rate: 110 {beats}/min

## 2021-11-15 LAB — CBC WITH AUTO DIFFERENTIAL
Absolute Immature Granulocyte: 0.3 10*3/uL — ABNORMAL HIGH (ref 0.00–0.04)
Absolute Immature Granulocyte: 0.4 10*3/uL
Basophils %: 1 % (ref 0–1)
Basophils %: 0 % (ref 0–1)
Basophils Absolute: 0.2 10*3/uL — ABNORMAL HIGH (ref 0.0–0.1)
Basophils Absolute: 0 10*3/uL (ref 0.0–0.1)
Eosinophils %: 2 % (ref 0–7)
Eosinophils %: 1 % (ref 0–7)
Eosinophils Absolute: 0.5 10*3/uL — ABNORMAL HIGH (ref 0.0–0.4)
Eosinophils Absolute: 0.4 10*3/uL (ref 0.0–0.4)
Hematocrit: 36.3 % (ref 35.0–47.0)
Hematocrit: 37.2 % (ref 35.0–47.0)
Hemoglobin: 11.6 g/dL (ref 11.5–16.0)
Hemoglobin: 11.9 g/dL (ref 11.5–16.0)
Immature Granulocytes: 1 % — ABNORMAL HIGH (ref 0–0.5)
Immature Granulocytes: 1 %
Lymphocytes %: 8 % — ABNORMAL LOW (ref 12–49)
Lymphocytes %: 6 % — ABNORMAL LOW (ref 12–49)
Lymphocytes Absolute: 2.6 10*3/uL (ref 0.8–3.5)
Lymphocytes Absolute: 2.2 10*3/uL (ref 0.8–3.5)
MCH: 29.5 PG (ref 26.0–34.0)
MCH: 29.2 PG (ref 26.0–34.0)
MCHC: 32 g/dL (ref 30.0–36.5)
MCHC: 32 g/dL (ref 30.0–36.5)
MCV: 92.4 FL (ref 80.0–99.0)
MCV: 91.4 FL (ref 80.0–99.0)
MPV: 9.1 FL (ref 8.9–12.9)
MPV: 9.2 FL (ref 8.9–12.9)
Monocytes %: 6 % (ref 5–13)
Monocytes %: 6 % (ref 5–13)
Monocytes Absolute: 2.1 10*3/uL — ABNORMAL HIGH (ref 0.0–1.0)
Monocytes Absolute: 2.2 10*3/uL — ABNORMAL HIGH (ref 0.0–1.0)
Neutrophils %: 82 % — ABNORMAL HIGH (ref 32–75)
Neutrophils %: 86 % — ABNORMAL HIGH (ref 32–75)
Neutrophils Absolute: 28.2 10*3/uL — ABNORMAL HIGH (ref 1.8–8.0)
Neutrophils Absolute: 31.8 10*3/uL — ABNORMAL HIGH (ref 1.8–8.0)
Nucleated RBCs: 0 PER 100 WBC
Nucleated RBCs: 0 PER 100 WBC
Platelets: 483 10*3/uL — ABNORMAL HIGH (ref 150–400)
Platelets: 421 10*3/uL — ABNORMAL HIGH (ref 150–400)
RBC: 3.93 M/uL (ref 3.80–5.20)
RBC: 4.07 M/uL (ref 3.80–5.20)
RDW: 14.9 % — ABNORMAL HIGH (ref 11.5–14.5)
RDW: 14.7 % — ABNORMAL HIGH (ref 11.5–14.5)
WBC: 33.7 10*3/uL — ABNORMAL HIGH (ref 3.6–11.0)
WBC: 37 10*3/uL — ABNORMAL HIGH (ref 3.6–11.0)
nRBC: 0 10*3/uL (ref 0.00–0.01)
nRBC: 0 10*3/uL (ref 0.00–0.01)

## 2021-11-15 LAB — COMPREHENSIVE METABOLIC PANEL
ALT: 19 U/L (ref 12–78)
AST: 19 U/L (ref 15–37)
Albumin/Globulin Ratio: 0.8 — ABNORMAL LOW (ref 1.1–2.2)
Albumin: 3 g/dL — ABNORMAL LOW (ref 3.5–5.0)
Alk Phosphatase: 125 U/L — ABNORMAL HIGH (ref 45–117)
Anion Gap: 8 mmol/L (ref 5–15)
BUN: 32 mg/dL — ABNORMAL HIGH (ref 6–20)
Bun/Cre Ratio: 36 — ABNORMAL HIGH (ref 12–20)
CO2: 25 mmol/L (ref 21–32)
Calcium: 9.1 mg/dL (ref 8.5–10.1)
Chloride: 104 mmol/L (ref 97–108)
Creatinine: 0.88 mg/dL (ref 0.55–1.02)
Est, Glom Filt Rate: >60 mL/min/{1.73_m2} (ref >60)
Globulin: 4 g/dL (ref 2.0–4.0)
Glucose: 125 mg/dL — ABNORMAL HIGH (ref 65–100)
Potassium: 3.1 mmol/L — ABNORMAL LOW (ref 3.5–5.1)
Sodium: 137 mmol/L (ref 136–145)
Total Bilirubin: 0.3 mg/dL (ref 0.2–1.0)
Total Protein: 7 g/dL (ref 6.4–8.2)

## 2021-11-15 LAB — RENAL FUNCTION PANEL
Albumin: 2.7 g/dL — ABNORMAL LOW (ref 3.5–5.0)
Anion Gap: 7 mmol/L (ref 5–15)
BUN: 18 mg/dL (ref 6–20)
Bun/Cre Ratio: 30 — ABNORMAL HIGH (ref 12–20)
CO2: 24 mmol/L (ref 21–32)
Calcium: 8.5 mg/dL (ref 8.5–10.1)
Chloride: 110 mmol/L — ABNORMAL HIGH (ref 97–108)
Creatinine: 0.61 mg/dL (ref 0.55–1.02)
Est, Glom Filt Rate: >60 mL/min/{1.73_m2} (ref >60)
Glucose: 116 mg/dL — ABNORMAL HIGH (ref 65–100)
Phosphorus: 2.1 mg/dL — ABNORMAL LOW (ref 2.6–4.7)
Potassium: 3.3 mmol/L — ABNORMAL LOW (ref 3.5–5.1)
Sodium: 141 mmol/L (ref 136–145)

## 2021-11-15 LAB — C DIFF TOXIN/ANTIGEN
C Diff Toxin Interpretation: POSITIVE — AB
C difficile Toxin, EIA: POSITIVE — AB
GDH Antigen: POSITIVE

## 2021-11-15 LAB — PROCALCITONIN: Procalcitonin: 0.3 ng/mL — ABNORMAL HIGH

## 2021-11-15 LAB — MAGNESIUM: Magnesium: 2.1 mg/dL (ref 1.6–2.4)

## 2021-11-15 MED ORDER — NORMAL SALINE FLUSH 0.9 % IV SOLN
0.9 % | Freq: Two times a day (BID) | INTRAVENOUS | Status: AC
Start: 2021-11-15 — End: 2021-11-21
  Administered 2021-11-15 – 2021-11-21 (×11): 10 mL via INTRAVENOUS

## 2021-11-15 MED ORDER — MIRTAZAPINE 15 MG PO TABS
15 MG | Freq: Every evening | ORAL | Status: DC
Start: 2021-11-15 — End: 2021-11-21
  Administered 2021-11-17 – 2021-11-21 (×5): 15 mg via ORAL

## 2021-11-15 MED ORDER — ONDANSETRON HCL 4 MG/2ML IJ SOLN
42 MG/2ML | Freq: Four times a day (QID) | INTRAMUSCULAR | Status: AC | PRN
Start: 2021-11-15 — End: 2021-11-21
  Administered 2021-11-17: 02:00:00 4 mg via INTRAVENOUS

## 2021-11-15 MED ORDER — ACETAMINOPHEN 325 MG PO TABS
325 | Freq: Four times a day (QID) | ORAL | Status: DC | PRN
Start: 2021-11-15 — End: 2021-11-21

## 2021-11-15 MED ORDER — LACTATED RINGERS IV BOLUS
Freq: Once | INTRAVENOUS | Status: AC
Start: 2021-11-15 — End: 2021-11-15
  Administered 2021-11-15: 06:00:00 1000 mL via INTRAVENOUS

## 2021-11-15 MED ORDER — HYDROMORPHONE HCL PF 1 MG/ML IJ SOLN
1 | INTRAMUSCULAR | Status: DC | PRN
Start: 2021-11-15 — End: 2021-11-21
  Administered 2021-11-18: 14:00:00 0.5 mg via INTRAVENOUS

## 2021-11-15 MED ORDER — LACTATED RINGERS IV SOLN
INTRAVENOUS | Status: DC
Start: 2021-11-15 — End: 2021-11-15
  Administered 2021-11-15 (×2): via INTRAVENOUS

## 2021-11-15 MED ORDER — FAMOTIDINE 20 MG PO TABS
20 | Freq: Every day | ORAL | Status: DC
Start: 2021-11-15 — End: 2021-11-21
  Administered 2021-11-15: 14:00:00 20 mg via ORAL

## 2021-11-15 MED ORDER — SODIUM CHLORIDE 0.45 % IV SOLN
0.45 | INTRAVENOUS | Status: DC
Start: 2021-11-15 — End: 2021-11-16
  Administered 2021-11-15 – 2021-11-16 (×2): via INTRAVENOUS

## 2021-11-15 MED ORDER — ALBUTEROL SULFATE HFA 108 (90 BASE) MCG/ACT IN AERS
108 (90 Base) MCG/ACT | RESPIRATORY_TRACT | Status: AC | PRN
Start: 2021-11-15 — End: 2021-11-21

## 2021-11-15 MED ORDER — ACETAMINOPHEN 650 MG RE SUPP
650 | Freq: Four times a day (QID) | RECTAL | Status: DC | PRN
Start: 2021-11-15 — End: 2021-11-21

## 2021-11-15 MED ORDER — IOPAMIDOL 76 % IV SOLN
76 % | Freq: Once | INTRAVENOUS | Status: AC | PRN
Start: 2021-11-15 — End: 2021-11-14
  Administered 2021-11-15: 02:00:00 100 mL via INTRAVENOUS

## 2021-11-15 MED ORDER — SODIUM CHLORIDE 0.9 % IV BOLUS
0.9 % | Freq: Once | INTRAVENOUS | Status: AC
Start: 2021-11-15 — End: 2021-11-14
  Administered 2021-11-15: 1000 mL via INTRAVENOUS

## 2021-11-15 MED ORDER — SODIUM CHLORIDE 0.9 % IV SOLN
0.9 % | INTRAVENOUS | Status: AC | PRN
Start: 2021-11-15 — End: 2021-11-21

## 2021-11-15 MED ORDER — POTASSIUM BICARB-CITRIC ACID 20 MEQ PO TBEF
20 MEQ | Freq: Once | ORAL | Status: AC
Start: 2021-11-15 — End: 2021-11-15
  Administered 2021-11-15: 06:00:00 40 meq via ORAL

## 2021-11-15 MED ORDER — VANCOMYCIN HCL 125 MG PO CAPS
125 MG | Freq: Four times a day (QID) | ORAL | Status: DC
Start: 2021-11-15 — End: 2021-11-16
  Administered 2021-11-15 – 2021-11-16 (×5): 125 mg via ORAL

## 2021-11-15 MED ORDER — NORMAL SALINE FLUSH 0.9 % IV SOLN
0.9 % | INTRAVENOUS | Status: AC | PRN
Start: 2021-11-15 — End: 2021-11-21

## 2021-11-15 MED ORDER — ONDANSETRON 4 MG PO TBDP
4 MG | Freq: Three times a day (TID) | ORAL | Status: AC | PRN
Start: 2021-11-15 — End: 2021-11-21
  Administered 2021-11-15: 07:00:00 4 mg via ORAL

## 2021-11-15 MED ORDER — POTASSIUM CHLORIDE ER 10 MEQ PO TBCR
10 MEQ | Freq: Once | ORAL | Status: AC
Start: 2021-11-15 — End: 2021-11-14
  Administered 2021-11-15: 03:00:00 40 meq via ORAL

## 2021-11-15 MED FILL — VANCOMYCIN HCL 125 MG PO CAPS: 125 MG | ORAL | Qty: 1

## 2021-11-15 MED FILL — EFFER-K 20 MEQ PO TBEF: 20 MEQ | ORAL | Qty: 2

## 2021-11-15 MED FILL — LACTATED RINGERS IV SOLN: INTRAVENOUS | Qty: 1000

## 2021-11-15 MED FILL — SODIUM CHLORIDE 0.45 % IV SOLN: 0.45 % | INTRAVENOUS | Qty: 1000

## 2021-11-15 MED FILL — FAMOTIDINE 20 MG PO TABS: 20 MG | ORAL | Qty: 1

## 2021-11-15 MED FILL — POTASSIUM CHLORIDE ER 10 MEQ PO TBCR: 10 MEQ | ORAL | Qty: 4

## 2021-11-15 MED FILL — ISOVUE-370 76 % IV SOLN: 76 % | INTRAVENOUS | Qty: 100

## 2021-11-15 MED FILL — ONDANSETRON 4 MG PO TBDP: 4 MG | ORAL | Qty: 1

## 2021-11-15 MED FILL — SODIUM CHLORIDE 0.9 % IV SOLN: 0.9 % | INTRAVENOUS | Qty: 1000

## 2021-11-15 MED FILL — SODIUM CHLORIDE FLUSH 0.9 % IV SOLN: 0.9 % | INTRAVENOUS | Qty: 40

## 2021-11-15 NOTE — Plan of Care (Signed)
Problem: Skin/Tissue Integrity  Goal: Absence of new skin breakdown  Description: 1.  Monitor for areas of redness and/or skin breakdown  2.  Assess vascular access sites hourly  3.  Every 4-6 hours minimum:  Change oxygen saturation probe site  4.  Every 4-6 hours:  If on nasal continuous positive airway pressure, respiratory therapy assess nares and determine need for appliance change or resting period.  11/15/2021 1910 by Marin Comment, RN  Outcome: Progressing  11/15/2021 0640 by Tonita Cong, LPN  Outcome: Progressing     Problem: Discharge Planning  Goal: Discharge to home or other facility with appropriate resources  Outcome: Progressing  Flowsheets (Taken 11/15/2021 0546 by Burman Foster, RN)  Discharge to home or other facility with appropriate resources: Identify barriers to discharge with patient and caregiver     Problem: Pain  Goal: Verbalizes/displays adequate comfort level or baseline comfort level  Outcome: Progressing     Problem: Safety - Adult  Goal: Free from fall injury  Outcome: Progressing     Problem: ABCDS Injury Assessment  Goal: Absence of physical injury  Outcome: Progressing

## 2021-11-15 NOTE — Care Coordination-Inpatient (Signed)
11/15/21 1148   Service Assessment   Patient Orientation Alert and Oriented   Cognition Alert   History Provided By Patient   Primary Caregiver Other (Comment)  [facility])   Support Systems Friends/Neighbors;Other (Comment)  [facility])   [place] is: Legal Next of [place]   PCP Verified by CM Yes   Last Visit to PCP Within last 3 months   Prior Functional Level Assistance with the following:;Bathing;Dressing;Toileting   Current Functional Level Assistance with the following:;Bathing;Dressing;Toileting   Can patient return to prior living arrangement Yes   Ability to make needs known: Good   Family able to assist with home care needs: Yes   Would you like for me to discuss the discharge plan with any other family members/significant others, and if so, who? No   Financial Resources None   [employer] None   Social/Functional History   Lives With Other (comment)   Home Equipment Wheelchair-manual;Cane;Cane, quad   Receives Help From Other (comment)  [facility])   ADL Assistance Needs assistance   Bath Minimal assistance   Dressing Minimal assistance   Grooming Minimal assistance   Feeding Independent   Toileting Needs assistance   [place] Needs assistance   Ambulation Assistance Needs assistance   Transfer Assistance Needs assistance   Active Driver No   Discharge Planning   Type of Residence Long-Term Care   Potential Assistance Purchasing Medications No   Patient expects to be discharged to: Long-term care   Services At/After Discharge   Mode of Transport at Discharge Other (see comment)   Confirm Follow Up Transport Other (see comment)  (TBD)   Condition of Participation: Discharge Planning   The Patient and/or Patient Representative was provided with a Choice of Provider? Patient   The Patient and/Or Patient Representative agree with the Discharge Plan? Yes   Freedom of Choice list was provided with basic dialogue that supports the patient's individualized plan of care/goals,  treatment preferences, and shares the quality data associated with the providers?  Yes     CM met f/f  with Pt, she stated that she is lTC at [facility].     Pt has a walker, cane and wheelchair, Pt stated that she needs help with ADL    Transport to TBD    Pt signed a choice letter for [facility], will send a referral, will place choice letter on Pt chart.    CM dispo: [facility].

## 2021-11-15 NOTE — ED Notes (Signed)
Patient report tubed at this time to 2E. Mary notified.     Jabier Mutton, RN  11/15/21 [phone]

## 2021-11-15 NOTE — H&P (Signed)
Hospitalist History & Physical Notes.           [facility].              Name : [name]      MRN number : [id number]     Date of Birth : 02/22/1945     Subjective :   Chief Complaint : Continued diarrhea, generalized weakness    Source of information : Mostly from the records.  Patient is in good deep sleep.    History of present illness:   [name] is  76 y.o. female with history of COPD, hyperlipidemia recently diagnosed with colitis suspected infectious process treated with antibiotics, discharged to [facility].  But she did not get any better continue to have diarrhea that got worse for last few days.  It is watery in nature multiple episodes with generalized weakness.  Found with tachycardia, leukocytosis, volume contraction with prerenal azotemia, hypokalemia.    Recent admission found with long segment colitis that was treated with antibiotics.  Today CT abdomen pelvis did not show any significant abnormalities, no pathology that explains diarrhea.    Suspected C. difficile colitis.    Past Medical History:   Diagnosis Date    Chronic obstructive pulmonary disease ([place])     Hypercholesteremia     Insomnia     Sleep disorder      Past Surgical History:   Procedure Laterality Date    CT GUIDED CHEST TUBE  12/06/2018    CT GUIDED CHEST TUBE    ORTHOPEDIC SURGERY  05/26/2021    LEFT HIP REVISION POSTERIOR APPROACH WITH ALLOGRAFT    PARTIAL HYSTERECTOMY (CERVIX NOT REMOVED)      SHOULDER SURGERY Left      Family History   Family history unknown: Yes      Social History     Tobacco Use    Smoking status: Former     Packs/day: 1     Types: Cigarettes     Quit date: 01/24/2019     Years since quitting: 2.8    Smokeless tobacco: Never   Substance Use Topics    Alcohol use: Yes     Alcohol/week: 1.0 standard drink of alcohol       Allergies   Allergen Reactions    Cheese Nausea And Vomiting     Severe vomiting  Severe vomiting  Severe vomiting      Current  Facility-Administered Medications   Medication Dose Route Frequency Provider Last Rate Last Admin    lactated ringers bolus bolus 1,000 mL  1,000 mL IntraVENous Once Crawford Givens, MD        lactated ringers IV soln infusion   IntraVENous Continuous Crawford Givens, MD        vancomycin (VANCOCIN) capsule 125 mg  125 mg Oral 4x Daily Crawford Givens, MD         Current Outpatient Medications   Medication Sig Dispense Refill    metoprolol tartrate (LOPRESSOR) 25 MG tablet Take 0.5 tablets by mouth 2 times daily      mirtazapine (REMERON) 30 MG tablet Take 1 tablet by mouth nightly      atorvastatin (LIPITOR) 40 MG tablet Take 1 tablet by mouth daily      aspirin 81 MG EC tablet Take 1 tablet by mouth daily 30 tablet 3    albuterol sulfate HFA (PROVENTIL;VENTOLIN;PROAIR) 108 (90 Base) MCG/ACT inhaler albuterol sulfate HFA 90 mcg/actuation aerosol inhaler   INHALE  2 PUFFS EVERY 6 HOURS AS NEEDED      omeprazole (PRILOSEC) 20 MG delayed release capsule Take 1 capsule by mouth daily              Review of Systems: Unable to get any information from patient as she is sleeping.    Vitals:     Vitals:    11/14/21 2230 11/14/21 2315 11/15/21 0030 11/15/21 0042   BP:   (!) 153/83    Pulse: (!) 106 (!) 106 (!) 105    Resp: 20 27  16    Temp:       TempSrc:       SpO2: 95% 94% 94%    Weight:       Height:           Physical Exam:   General : Thin built, looks very tired pale and weak.  HEENT : Dry oral mucosa, atraumatic normocephalic, Normal ear and nose.  Neck : Supple, no JVD, no masses noted, no carotid bruit.  Lungs : Breath sounds with heart rate air entry bilaterally, no wheezes or rales, no accessory muscle use.  CVS : Rhythm rate regular, tachycardia.  Abdomen : Soft, nontender,  bowel sounds present.  Extremities : No edema noted,  pedal pulses not palpable.  Musculoskeletal : Generalized muscle wasting.  No joint swelling or effusion, muscle tone and power appears normal.   Skin : Dry,  pale.Marland Kitchen  Neurological : In deep sleep.  Responds to commands but unable to stay awake.      Data Review:   Recent Results (from the past 24 hour(s))   CBC with Auto Differential    Collection Time: 11/14/21  8:15 PM   Result Value Ref Range    WBC 33.7 (H) 3.6 - 11.0 K/uL    RBC 3.93 3.80 - 5.20 M/uL    Hemoglobin 11.6 11.5 - 16.0 g/dL    Hematocrit 36.3 35.0 - 47.0 %    MCV 92.4 80.0 - 99.0 FL    MCH 29.5 26.0 - 34.0 PG    MCHC 32.0 30.0 - 36.5 g/dL    RDW 14.9 (H) 11.5 - 14.5 %    Platelets 483 (H) 150 - 400 K/uL    MPV 9.1 8.9 - 12.9 FL    Nucleated RBCs 0.0 0.0 PER 100 WBC    nRBC 0.00 0.00 - 0.01 K/uL    Neutrophils % 82 (H) 32 - 75 %    Lymphocytes % 8 (L) 12 - 49 %    Monocytes % 6 5 - 13 %    Eosinophils % 2 0 - 7 %    Basophils % 1 0 - 1 %    Immature Granulocytes 1 (H) 0 - 0.5 %    Neutrophils Absolute 28.2 (H) 1.8 - 8.0 K/UL    Lymphocytes Absolute 2.6 0.8 - 3.5 K/UL    Monocytes Absolute 2.1 (H) 0.0 - 1.0 K/UL    Eosinophils Absolute 0.5 (H) 0.0 - 0.4 K/UL    Basophils Absolute 0.2 (H) 0.0 - 0.1 K/UL    Absolute Immature Granulocyte 0.3 (H) 0.00 - 0.04 K/UL    Differential Type AUTOMATED     Comprehensive Metabolic Panel    Collection Time: 11/14/21  8:15 PM   Result Value Ref Range    Sodium 137 136 - 145 mmol/L    Potassium 3.1 (L) 3.5 - 5.1 mmol/L    Chloride 104 97 - 108 mmol/L    CO2  25 21 - 32 mmol/L    Anion Gap 8 5 - 15 mmol/L    Glucose 125 (H) 65 - 100 mg/dL    BUN 32 (H) 6 - 20 mg/dL    Creatinine 0.88 0.55 - 1.02 mg/dL    Bun/Cre Ratio 36 (H) 12 - 20      Est, Glom Filt Rate >60 >60 ml/min/1.7m    Calcium 9.1 8.5 - 10.1 mg/dL    Total Bilirubin 0.3 0.2 - 1.0 mg/dL    AST 19 15 - 37 U/L    ALT 19 12 - 78 U/L    Alk Phosphatase 125 (H) 45 - 117 U/L    Total Protein 7.0 6.4 - 8.2 g/dL    Albumin 3.0 (L) 3.5 - 5.0 g/dL    Globulin 4.0 2.0 - 4.0 g/dL    Albumin/Globulin Ratio 0.8 (L) 1.1 - 2.2     Magnesium    Collection Time: 11/14/21  8:15 PM   Result Value Ref Range    Magnesium 2.1 1.6 - 2.4  mg/dL   Troponin    Collection Time: 11/14/21  8:15 PM   Result Value Ref Range    Troponin, High Sensitivity 4 0 - 51 ng/L       Radiologic Studies :   Imaging:   CT ABDOMEN PELVIS W IV CONTRAST Additional Contrast? None   Final Result      1. Sigmoid diverticulosis without identified diverticulitis with no identified   cause for diarrhea.   2. Age-indeterminate L2 compression deformity with 50% height loss and posterior   retropulsed fragments moderately narrowing the canal.   3. Bilateral nonobstructing nephrolithiasis.   4. Additional incidentals as above.      XR CHEST PORTABLE   Final Result      No acute process on portable chest.                Assessment and Plan :     Suspected C. difficile colitis: Continued diarrhea.  Not sure if it was any better from last admission.  She did have an episode of diarrhea that is very watery soaked into the diaper in ED.  We will start on vancomycin, will send stool for C. difficile when available.    Dehydration: Secondary to volume loss and poor oral intake.  She is given IV fluids in the emergency room I will give 1 more liter of bolus fluids with lactated Ringer and continue maintenance fluids, encourage oral intake when she is able to    Prerenal HTN via: Elevated BUN.  Likely from volume contraction    Hypokalemia: Supplementation ordered by mouth 1 dose which she tolerated.  Will repeat another dose when she is awake.    Hyperlipidemia: On atorvastatin but due to her current status I will hold can restart once she is improved.    L2 compression fracture with 50% weight loss: Does have back pain.  As suspected infection will not proceed for any procedure.  Once she is stable medically will benefit with kyphoplasty    Admitted to medical telemetry      Medications Home :    [facility]    Code status : Full code    VTE prophylaxis : Risk of bleeding is higher than the benefit.    Advance Medical directive : Health care  decision maker information is on file.         Discussion/MDM:   I have  discussed patient's presentation/findings and clinical course to date with ED provider.     Given the patient's current clinical presentation, I have a high level of concern for decompensation if discharged from the emergency department as patient has multiple medical comorbidities with increased risk of morbidity and mortality  that warrants admission to hospital.     I have reviewed patient's presenting subjective and objective findings, as well as all laboratory studies, imaging studies, and vital signs to date as well as treatment rendered and patient's response to those treatments.  In addition, prior medical, surgical and relevant social and family histories were reviewed.      CC : Rigoberto Noel, APRN - NP  Signed By: Crawford Givens, MD     November 15, 2021      This dictation was done by dragon, computer voice recognition software.  Often unanticipated grammatical, syntax, Homer phones and other interpretive errors are inadvertently transcribed.  Please excuse errors that have escaped final proofreading.

## 2021-11-15 NOTE — Progress Notes (Cosign Needed)
Hospitalist Progress Note    NAME:   [name]   DOB: Aug 22, 1945   MRN: [id number]     Date/Time: 11/15/2021 3:24 PM  Patient PCP: Vevelyn Francois, APRN - NP    Estimated discharge date: >48 hours  Barriers: [facility]  [name] is a 76 year old female with PMH of COPD, HTN and hyperlipidemia that presented to the ER on 10/23 complaining of generalized fatigue and watery diarrhea that has progressively worsened the past couple of days. She was recently admitted with colitis suspected infectious process on 9/26 and discharged on 9/29 to [facility] with Ciprofloxacin and Flagyl.     In the ER, she was given a bolus of NS and potassium repletion for her potassium of 3.1. CT abdomen pelvis with IV contrast on 10/23 reveals sigmoid diverticulosis without identified diverticulitis with no identified cause for diarrhea. Chest- xray on 10/23 revealed no acute process. C diff positive    Assessment / Plan:    C. Difficile colitis, likely due to recent hospitalization and antibiotic use  - CT abdomen pelvis with IV contrast on 10/23 reveals sigmoid diverticulosis without identified diverticulitis with no identified cause for diarrhea.   - C. Difficile lab test results positive  -WBC 33.7--> 37  -Awaiting blood cultures  -Order urinalysis for resolution of previous UTI  -Hold Pepcid   -Started on vancomycin    2. Dehydration  - BUN improved 32-->18  Creatinine nml  - Continue .45 infusion    3. Hypokalemia  - Potassium is 3.3  Replete    4. Essential HTN  Continue metoprolol    5. Hyperlipemia  -Continue atorvastatin 40 mg     6. L2 compression fracture  - CT abd and pelvis on 10/23 revealed a age-indeterminate L2 compression deformity with 50% height loss and posterior retropulsed fragments moderately narrowing the canal.  - Advise patient to consider management as outpatient    7. Depression  -Continue Remeron     8.  Ground-level fall with bruising on the left supraorbital region  CT of  the head and maxillofacial bones    Medical Decision Making:   I personally reviewed labs: WBC 37, potassium 3.3  I personally reviewed imaging: CT abdomen, chest xray  Toxic drug monitoring: none  Discussed case with: Patient        Code Status: FULL CODE  DVT Prophylaxis: SCD  GI Prophylaxis: None    Subjective:     Chief Complaint / Reason for Physician Visit  Watery diarrhea for a few days with generalized fatigue.  Discussed with RN events overnight.       Objective:     VITALS:   Last 24hrs VS reviewed since prior progress note. Most recent are:  Patient Vitals for the past 24 hrs:   BP Temp Temp src Pulse Resp SpO2 Height Weight   11/15/21 1133 -- -- -- -- -- -- 1.6 m (5' 2.99") --   11/15/21 0745 (!) 146/85 98.6 F (37 C) Axillary (!) 104 17 94 % -- --   11/15/21 0533 (!) 140/89 98.4 F (36.9 C) Axillary (!) 104 20 94 % -- --   11/15/21 0430 136/79 98.3 F (36.8 C) Oral (!) 105 18 94 % -- --   11/15/21 0100 (!) 149/86 -- -- (!) 106 22 93 % -- --   11/15/21 0042 -- -- -- -- 16 -- -- --   11/15/21 0030 (!) 153/83 -- -- Marland Kitchen  105 -- 94 % -- --   11/14/21 2315 -- -- -- (!) 106 27 94 % -- --   11/14/21 2230 -- -- -- (!) 106 20 95 % -- --   11/14/21 2115 (!) 158/85 -- -- (!) 112 -- -- -- --   11/14/21 1950 (!) 146/81 97.8 F (36.6 C) Oral (!) 111 17 94 % 1.6 m (5\' 3" ) 40.8 kg (90 lb)           Intake/Output Summary (Last 24 hours) at 11/15/2021 1524  Last data filed at 11/15/2021 0244  Gross per 24 hour   Intake 2000 ml   Output --   Net 2000 ml          I had a face to face encounter and independently examined this patient on 11/15/2021, as outlined below:    Review of Systems   Constitutional:  Positive for fatigue.   Respiratory:  Negative for cough and shortness of breath.    Cardiovascular:  Negative for chest pain and leg swelling.   Gastrointestinal:  Positive for abdominal pain and diarrhea. Negative for vomiting.   Genitourinary: Negative.         PHYSICAL EXAM:  Physical Exam  Constitutional:        Comments: Weak appearing.   HENT:      Head: Normocephalic and atraumatic.      Comments: Left supraorbital bruising from a recent fall  Cardiovascular:      Rate and Rhythm: Normal rate and regular rhythm.   Pulmonary:      Effort: Pulmonary effort is normal.      Breath sounds: Normal breath sounds.   Abdominal:      Palpations: Abdomen is soft.      Tenderness: There is abdominal tenderness. There is no guarding.   Musculoskeletal:         General: No swelling or tenderness.      Cervical back: Normal range of motion.   Skin:     General: Skin is warm and dry.      Coloration: Skin is pale.      Comments: Increased skin turgor.    Neurological:      Mental Status: She is alert.          Reviewed most current lab test results and cultures  YES  Reviewed most current radiology test results   YES  Review and summation of old records today    NO  Reviewed patient's current orders and MAR    YES  PMH/SH reviewed - no change compared to H&P  ________________________________________________________________________  Care Plan discussed with:    Comments   Patient x    Family      RN x    Care Manager     Consultant                        Multidiciplinary team rounds were held today with case manager, nursing, pharmacist and Higher education careers adviser.  Patient's plan of care was discussed; medications were reviewed and discharge planning was addressed.     ________________________________________________________________________  Total NON critical care TIME:  35  Minutes    Total CRITICAL CARE TIME Spent:   Minutes non procedure based      Comments   >50% of visit spent in counseling and coordination of care     ________________________________________________________________________  Elsworth Soho, PA-C     Procedures: see electronic medical records for all procedures/Xrays and  details which were not copied into this note but were reviewed prior to creation of Plan.      LABS:  I reviewed today's most current labs and  imaging studies.  Pertinent labs include:  Recent Labs     11/14/21  2015 11/15/21  0554   WBC 33.7* 37.0*   HGB 11.6 11.9   HCT 36.3 37.2   PLT 483* 421*       Recent Labs     11/14/21  2015 11/15/21  0554   NA 137 141   K 3.1* 3.3*   CL 104 110*   CO2 25 24   BUN 32* 18   MG 2.1  --    PHOS  --  2.1*   ALT 19  --          Signed: Elsworth Soho, PA-C

## 2021-11-15 NOTE — Care Coordination-Inpatient (Signed)
Pt will D/C back to [facility].    [place] stated that the will have to get auth again.   Will need PT/OT eval/recommendations.

## 2021-11-15 NOTE — Progress Notes (Signed)
Comprehensive Nutrition Assessment    Type and Reason for Visit:  Initial, Positive Nutrition Screen (MST 3)    Nutrition Recommendations/Plan:   Continue ETC diet, add high fiber   Ensure Enlive x2/d (700kcals, 40g pro)  Consider bannatrol for cdiff   Please provide assistance and encouragement at all mealtimes  Monitor and document all PO/ONS intakes and Bms in I/Os     Malnutrition Assessment:  Malnutrition Status:  Insufficient data (11/15/21 1143)    Context:  Acute Illness     Findings of the 6 clinical characteristics of malnutrition:  Energy Intake:  Unable to assess  Weight Loss:  No significant weight loss (7.1% x10mo)     Body Fat Loss:  Unable to assess     Muscle Mass Loss:  Unable to assess    Fluid Accumulation:  No significant fluid accumulation    Grip Strength:  Not Performed    Nutrition Assessment:    Pt admitted for generalized fatigue xfew mo w/worsening diarrhea. Noted recent admit w/colitis dx-now +diverticulosis w/potential cdiff per CT of abdomen. Pt screened for low BMI (15.9) and MST 3 for >14# unintentional wt loss pta. Pt very confused upon RD assessment, UTO nutr/wt hx. Per EMR, 8# wt loss x57mo (7.1%, insignificant). Pt on ETC diet w/ONS, noted untouched L tray at bedside. Provide assitance and encouragement at mealtimes, will modify to high fiber and cnt ONS to aid in wt gain. Consider bannatrol for cdiff. Labs: K 3.3, Cl 110, gluc 116, phos 2.1. Meds: pepcid, remeron, vancomycin, lactated ringers.    Nutrition Related Findings:    NFPE deferred 2/2 contact precautions. No n/v/c nor h/o c/s issues. Last BM 10/23, watery-consider bannatrol. No edema. Wound Type: Pressure Injury (perineum)       Current Nutrition Intake & Therapies:    Average Meal Intake: 0%  Average Supplements Intake: Unable to assess  ADULT DIET; Easy to Chew  ADULT ORAL NUTRITION SUPPLEMENT; Breakfast, Lunch, Dinner; Herbalist Protein Oral Supplement    Anthropometric Measures:  Height: 160 cm (5'  2.99")  Ideal Body Weight (IBW): 115 lbs (52 kg)       Current Body Weight: 48 kg (105 lb 13.1 oz) (10/24), 92 % IBW. Weight Source: Bed Scale  Current BMI (kg/m2): 18.8        Weight Adjustment For: No Adjustment                 BMI Categories: Underweight (BMI less than 22) age over 41    Estimated Daily Nutrient Needs:  Energy Requirements Based On: Kcal/kg  Weight Used for Energy Requirements: Current  Energy (kcal/day): 1440-1680kcals/d (30-35kcal/kg, wt gain)  Weight Used for Protein Requirements: Current  Protein (g/day): 58g/d (1.2g/kg)  Method Used for Fluid Requirements: 1 ml/kcal  Fluid (ml/day): 1661mL    Nutrition Diagnosis:   Underweight related to inadequate protein-energy intake as evidenced by BMI  Altered GI function related to altered GI structure as evidenced by GI abnormality (diverticulosis, Cdiff)    Nutrition Interventions:   Food and/or Nutrient Delivery: Modify Current Diet, Continue Oral Nutrition Supplement, Continue Current Diet  Nutrition Education/Counseling: Education needed (diverticulosis)  Coordination of Nutrition Care: Continue to monitor while inpatient, Feeding Assistance/Environment Change  Plan of Care discussed with: Pt    Goals:     Goals: PO intake 50% or greater, by next RD assessment     Nutrition Monitoring and Evaluation:   Behavioral-Environmental Outcomes: Knowledge or Skill  Food/Nutrient Intake Outcomes: Diet Advancement/Tolerance, Food  and Nutrient Intake, Supplement Intake, IVF Intake  Physical Signs/Symptoms Outcomes: Biochemical Data, Diarrhea, GI Status, Meal Time Behavior, Skin, Weight    Discharge Planning:    Continue Oral Nutrition Supplement, Continue current diet     Magdalene Molly, RD  Contact: [phone]

## 2021-11-15 NOTE — Progress Notes (Signed)
Informed Dr. Donneta Romberg of critical lab value- patient positive for Cdiff and did not see response that patient belonged to a PA. PA Reasoner subsequently notified.

## 2021-11-15 NOTE — Progress Notes (Signed)
Hospitalist Progress Note    NAME:   [name]   DOB: [birth year]   MRN: [id number]     Date/Time: 11/15/2021 8:39 AM  Patient PCP: Rigoberto Noel, APRN - NP    Estimated discharge date: <48 hours  Barriers: [place] [facility]  [name] is a 76 year old female with PMH of COPD, HTN and hyperlipidemia that presented to the ER on 10/23 complaining of generalized fatigue and watery diarrhea that has progressively worsened the past couple of days. She was recently admitted with colitis suspected infectious process on 9/26 and discharged on 9/29 to [facility] with Ciprofloxacin and Flagyl.     In the ER, she was given a bolus of NS and potassium repletion for her potassium of 3.1. CT abdomen pelvis with IV contrast on 10/23 reveals sigmoid diverticulosis without identified diverticulitis with no identified cause for diarrhea. Chest- xray on 10/23 revealed no acute process.    Assessment / Plan:    C. Difficile colitis, likely due to recent hospitalization and antibiotic use  - CT abdomen pelvis with IV contrast on 10/23 reveals sigmoid diverticulosis without identified diverticulitis with no identified cause for diarrhea.   - C. Difficile lab test results positive  -WBC 33.7--> 37  -Awaiting blood cultures  -Order urinalysis  -Hold Pepcid   -Started on vancomycin    2. Dehydration  - BUN improved 32-->18  - Continue LR infusion    3. Hypokalemia  - Potassium is 3.3  - Give oral potassium 40 mEq once    4. Essential HTN  -Hold Metoprolol as current increased HR is compensating for dehydration     5. Hyperlipemia  -Continue atorvastatin 40 mg     6. L2 compression fracture  - CT abd and pelvis on 10/23 revealed a age-indeterminate L2 compression deformity with 50% height loss and posterior retropulsed fragments moderately narrowing the canal.  - Advise patient to consider management as outpatient    7. Depression  -Continue Remeron         Medical Decision Making:   I personally  reviewed labs: WBC 37, potassium 3.3  I personally reviewed imaging: CT abdomen, chest xray  Toxic drug monitoring: WBC  Discussed case with: Patient        Code Status: FULL CODE  DVT Prophylaxis: SCD  GI Prophylaxis: None    Subjective:     Chief Complaint / Reason for Physician Visit  Watery diarrhea for a few days with generalized fatigue.  Discussed with RN events overnight.       Objective:     VITALS:   Last 24hrs VS reviewed since prior progress note. Most recent are:  Patient Vitals for the past 24 hrs:   BP Temp Temp src Pulse Resp SpO2 Height Weight   11/15/21 0745 (!) 146/85 98.6 F (37 C) Axillary (!) 104 17 94 % -- --   11/15/21 0533 (!) 140/89 98.4 F (36.9 C) Axillary (!) 104 20 94 % -- --   11/15/21 0430 136/79 98.3 F (36.8 C) Oral (!) 105 18 94 % -- --   11/15/21 0100 (!) 149/86 -- -- (!) 106 22 93 % -- --   11/15/21 0042 -- -- -- -- 16 -- -- --   11/15/21 0030 (!) 153/83 -- -- (!) 105 -- 94 % -- --   11/14/21 2315 -- -- -- (!) 106 27 94 % -- --   11/14/21 2230 -- -- -- Marland Kitchen  106 20 95 % -- --   11/14/21 2115 (!) 158/85 -- -- (!) 112 -- -- -- --   11/14/21 1950 (!) 146/81 97.8 F (36.6 C) Oral (!) 111 17 94 % 1.6 m (5\' 3" ) 40.8 kg (90 lb)         Intake/Output Summary (Last 24 hours) at 11/15/2021 0839  Last data filed at 11/15/2021 0244  Gross per 24 hour   Intake 2000 ml   Output --   Net 2000 ml        I had a face to face encounter and independently examined this patient on 11/15/2021, as outlined below:    Review of Systems   Constitutional:  Positive for fatigue.   Respiratory:  Negative for cough and shortness of breath.    Cardiovascular:  Negative for chest pain and leg swelling.   Gastrointestinal:  Positive for abdominal pain and diarrhea. Negative for vomiting.   Genitourinary: Negative.         PHYSICAL EXAM:  Physical Exam  Constitutional:       Comments: Weak appearing.   HENT:      Head: Normocephalic and atraumatic.   Cardiovascular:      Rate and Rhythm: Normal rate and regular  rhythm.   Pulmonary:      Effort: Pulmonary effort is normal.      Breath sounds: Normal breath sounds.   Abdominal:      Palpations: Abdomen is soft.      Tenderness: There is abdominal tenderness. There is no guarding.   Musculoskeletal:         General: No swelling or tenderness.      Cervical back: Normal range of motion.   Skin:     General: Skin is warm and dry.      Coloration: Skin is pale.      Comments: Increased skin turgor.    Neurological:      Mental Status: She is alert.          Reviewed most current lab test results and cultures  YES  Reviewed most current radiology test results   YES  Review and summation of old records today    NO  Reviewed patient's current orders and MAR    YES  PMH/SH reviewed - no change compared to H&P  ________________________________________________________________________  Care Plan discussed with:    Comments   Patient x    Family      RN x    Care Manager     Consultant                        Multidiciplinary team rounds were held today with case manager, nursing, pharmacist and 11/17/2021.  Patient's plan of care was discussed; medications were reviewed and discharge planning was addressed.     ________________________________________________________________________  Total NON critical care TIME:  35  Minutes    Total CRITICAL CARE TIME Spent:   Minutes non procedure based      Comments   >50% of visit spent in counseling and coordination of care     ________________________________________________________________________  Higher education careers adviser     Procedures: see electronic medical records for all procedures/Xrays and details which were not copied into this note but were reviewed prior to creation of Plan.      LABS:  I reviewed today's most current labs and imaging studies.  Pertinent labs include:  Recent Labs     11/14/21  2015  11/15/21  0554   WBC 33.7* 37.0*   HGB 11.6 11.9   HCT 36.3 37.2   PLT 483* 421*     Recent Labs     11/14/21  2015 11/15/21  0554   NA  137 141   K 3.1* 3.3*   CL 104 110*   CO2 25 24   BUN 32* 18   MG 2.1  --    PHOS  --  2.1*   ALT 19  --        Signed: Mathis Bud

## 2021-11-15 NOTE — Plan of Care (Signed)
Problem: Skin/Tissue Integrity  Goal: Absence of new skin breakdown  Description: 1.  Monitor for areas of redness and/or skin breakdown  2.  Assess vascular access sites hourly  3.  Every 4-6 hours minimum:  Change oxygen saturation probe site  4.  Every 4-6 hours:  If on nasal continuous positive airway pressure, respiratory therapy assess nares and determine need for appliance change or resting period.  11/15/2021 2315 by Kerry Fort, RN  Outcome: Progressing  11/15/2021 1910 by Marin Comment, RN  Outcome: Progressing     Problem: Discharge Planning  Goal: Discharge to home or other facility with appropriate resources  11/15/2021 1910 by Marin Comment, RN  Outcome: Progressing  Flowsheets (Taken 11/15/2021 0546 by Burman Foster, RN)  Discharge to home or other facility with appropriate resources: Identify barriers to discharge with patient and caregiver     Problem: Pain  Goal: Verbalizes/displays adequate comfort level or baseline comfort level  11/15/2021 2315 by Kerry Fort, RN  Outcome: Progressing  11/15/2021 1910 by Marin Comment, RN  Outcome: Progressing     Problem: Safety - Adult  Goal: Free from fall injury  11/15/2021 2315 by Kerry Fort, RN  Outcome: Progressing  11/15/2021 1910 by Marin Comment, RN  Outcome: Progressing     Problem: ABCDS Injury Assessment  Goal: Absence of physical injury  11/15/2021 2315 by Kerry Fort, RN  Outcome: Progressing  11/15/2021 1910 by Marin Comment, RN  Outcome: Progressing

## 2021-11-15 NOTE — Plan of Care (Signed)
Problem: Skin/Tissue Integrity  Goal: Absence of new skin breakdown  Description: 1.  Monitor for areas of redness and/or skin breakdown  2.  Assess vascular access sites hourly  3.  Every 4-6 hours minimum:  Change oxygen saturation probe site  4.  Every 4-6 hours:  If on nasal continuous positive airway pressure, respiratory therapy assess nares and determine need for appliance change or resting period.  Outcome: Progressing

## 2021-11-15 NOTE — Care Coordination-Inpatient (Signed)
11/15/21 1154   Readmission Assessment   Number of Days since last admission? 8-30 days   Previous Disposition Long Term Care   Who is being Interviewed Patient   What was the patient's/caregiver's perception as to why they think they needed to return back to the hospital? Other (Comment)  (No suggestions)   Did you visit your Primary Care Physician after you left the hospital, before you returned this time? No   Why weren't you able to visit your PCP? Did not have an appointment   Did you see a specialist, such as Cardiac, Pulmonary, Orthopedic Physician, etc. after you left the hospital? No   Who advised the patient to return to the hospital? Skilled Unit   Does the patient report anything that got in the way of taking their medications? No   In our efforts to provide the best possible care to you and others like you, can you think of anything that we could have done to help you after you left the hospital the first time, so that you might not have needed to return so soon? Additional Community resources available for illness support;Identify patient's health literacy needs

## 2021-11-16 ENCOUNTER — Inpatient Hospital Stay: Admit: 2021-11-16 | Payer: MEDICARE | Primary: Family

## 2021-11-16 LAB — CBC WITH AUTO DIFFERENTIAL
Absolute Immature Granulocyte: 0.5 10*3/uL — ABNORMAL HIGH (ref 0.00–0.04)
Basophils %: 0 % (ref 0–1)
Basophils Absolute: 0 10*3/uL (ref 0.0–0.1)
Eosinophils %: 3 % (ref 0–7)
Eosinophils Absolute: 0.8 10*3/uL — ABNORMAL HIGH (ref 0.0–0.4)
Hematocrit: 29.7 % — ABNORMAL LOW (ref 35.0–47.0)
Hemoglobin: 9.8 g/dL — ABNORMAL LOW (ref 11.5–16.0)
Immature Granulocytes: 2 % — ABNORMAL HIGH (ref 0–0.5)
Lymphocytes %: 11 % — ABNORMAL LOW (ref 12–49)
Lymphocytes Absolute: 2.9 10*3/uL (ref 0.8–3.5)
MCH: 30.2 PG (ref 26.0–34.0)
MCHC: 33 g/dL (ref 30.0–36.5)
MCV: 91.4 FL (ref 80.0–99.0)
MPV: 9.1 FL (ref 8.9–12.9)
Monocytes %: 6 % (ref 5–13)
Monocytes Absolute: 1.6 10*3/uL — ABNORMAL HIGH (ref 0.0–1.0)
Neutrophils %: 78 % — ABNORMAL HIGH (ref 32–75)
Neutrophils Absolute: 20.3 10*3/uL — ABNORMAL HIGH (ref 1.8–8.0)
Nucleated RBCs: 0 PER 100 WBC
Platelets: 351 10*3/uL (ref 150–400)
RBC: 3.25 M/uL — ABNORMAL LOW (ref 3.80–5.20)
RDW: 14.8 % — ABNORMAL HIGH (ref 11.5–14.5)
WBC: 26.1 10*3/uL — ABNORMAL HIGH (ref 3.6–11.0)
nRBC: 0 10*3/uL (ref 0.00–0.01)

## 2021-11-16 LAB — COMPREHENSIVE METABOLIC PANEL
ALT: 12 U/L (ref 12–78)
AST: 6 U/L — ABNORMAL LOW (ref 15–37)
Albumin/Globulin Ratio: 0.8 — ABNORMAL LOW (ref 1.1–2.2)
Albumin: 2.2 g/dL — ABNORMAL LOW (ref 3.5–5.0)
Alk Phosphatase: 169 U/L — ABNORMAL HIGH (ref 45–117)
Anion Gap: 8 mmol/L (ref 5–15)
BUN: 9 mg/dL (ref 6–20)
Bun/Cre Ratio: 20 (ref 12–20)
CO2: 25 mmol/L (ref 21–32)
Calcium: 7.9 mg/dL — ABNORMAL LOW (ref 8.5–10.1)
Chloride: 104 mmol/L (ref 97–108)
Creatinine: 0.44 mg/dL — ABNORMAL LOW (ref 0.55–1.02)
Est, Glom Filt Rate: >60 mL/min/{1.73_m2} (ref >60)
Globulin: 2.8 g/dL (ref 2.0–4.0)
Glucose: 93 mg/dL (ref 65–100)
Potassium: 2.7 mmol/L — CL (ref 3.5–5.1)
Sodium: 137 mmol/L (ref 136–145)
Total Bilirubin: 0.4 mg/dL (ref 0.2–1.0)
Total Protein: 5 g/dL — ABNORMAL LOW (ref 6.4–8.2)

## 2021-11-16 LAB — URINALYSIS WITH REFLEX TO CULTURE
BACTERIA, URINE: NEGATIVE /hpf
Bilirubin Urine: NEGATIVE
Blood, Urine: NEGATIVE
Glucose, UA: NEGATIVE mg/dL
Ketones, Urine: NEGATIVE mg/dL
Nitrite, Urine: NEGATIVE
Protein, UA: 30 mg/dL — AB
Specific Gravity, UA: 1.021 (ref 1.003–1.030)
Urobilinogen, Urine: 0.1 EU/dL (ref 0.1–1.0)
pH, Urine: 7 (ref 5.0–8.0)

## 2021-11-16 MED ORDER — POTASSIUM CHLORIDE 10 MEQ/100ML IV SOLN
10 | INTRAVENOUS | Status: AC
Start: 2021-11-16 — End: 2021-11-16
  Administered 2021-11-16 (×3): 10 meq via INTRAVENOUS

## 2021-11-16 MED ORDER — POTASSIUM CHLORIDE IN NACL 20-0.45 MEQ/L-% IV SOLN
20-0.45 | INTRAVENOUS | Status: DC
Start: 2021-11-16 — End: 2021-11-17
  Administered 2021-11-16 – 2021-11-17 (×2): via INTRAVENOUS

## 2021-11-16 MED ORDER — POTASSIUM CHLORIDE 10 MEQ/100ML IV SOLN
10 MEQ/0ML | INTRAVENOUS | Status: AC
Start: 2021-11-16 — End: 2021-11-16
  Administered 2021-11-16: 20:00:00 10 meq via INTRAVENOUS

## 2021-11-16 MED ORDER — KCL IN DEXTROSE-NACL 20-5-0.45 MEQ/L-%-% IV SOLN
20-5-0.45 | INTRAVENOUS | Status: DC
Start: 2021-11-16 — End: 2021-11-16

## 2021-11-16 MED ORDER — POTASSIUM CHLORIDE CRYS ER 20 MEQ PO TBCR
20 MEQ | Freq: Two times a day (BID) | ORAL | Status: DC
Start: 2021-11-16 — End: 2021-11-17
  Administered 2021-11-16 – 2021-11-17 (×2): 40 meq via ORAL

## 2021-11-16 MED ORDER — METOPROLOL TARTRATE 25 MG PO TABS
25 | Freq: Two times a day (BID) | ORAL | Status: DC
Start: 2021-11-16 — End: 2021-11-21
  Administered 2021-11-16 – 2021-11-21 (×11): 12.5 mg via ORAL

## 2021-11-16 MED FILL — VANCOMYCIN HCL 125 MG PO CAPS: 125 MG | ORAL | Qty: 1

## 2021-11-16 MED FILL — POTASSIUM CHLORIDE 10 MEQ/100ML IV SOLN: 10 MEQ/0ML | INTRAVENOUS | Qty: 100

## 2021-11-16 MED FILL — POTASSIUM CHLORIDE CRYS ER 20 MEQ PO TBCR: 20 MEQ | ORAL | Qty: 2

## 2021-11-16 MED FILL — METOPROLOL TARTRATE 25 MG PO TABS: 25 MG | ORAL | Qty: 1

## 2021-11-16 MED FILL — POTASSIUM CHLORIDE IN NACL 20-0.45 MEQ/L-% IV SOLN: INTRAVENOUS | Qty: 1000

## 2021-11-16 NOTE — Plan of Care (Signed)
Problem: Discharge Planning  Goal: Discharge to home or other facility with appropriate resources  Outcome: Progressing     Problem: Pain  Goal: Verbalizes/displays adequate comfort level or baseline comfort level  11/16/2021 1211 by Marin Comment, RN  Outcome: Progressing  11/15/2021 2315 by Kerry Fort, RN  Outcome: Progressing     Problem: Safety - Adult  Goal: Free from fall injury  11/16/2021 1211 by Marin Comment, RN  Outcome: Progressing  11/15/2021 2315 by Kerry Fort, RN  Outcome: Progressing     Problem: ABCDS Injury Assessment  Goal: Absence of physical injury  11/16/2021 1211 by Marin Comment, RN  Outcome: Progressing  11/15/2021 2315 by Kerry Fort, RN  Outcome: Progressing     Problem: Skin/Tissue Integrity  Goal: Absence of new skin breakdown  Description: 1.  Monitor for areas of redness and/or skin breakdown  2.  Assess vascular access sites hourly  3.  Every 4-6 hours minimum:  Change oxygen saturation probe site  4.  Every 4-6 hours:  If on nasal continuous positive airway pressure, respiratory therapy assess nares and determine need for appliance change or resting period.  11/16/2021 1211 by Marin Comment, RN  Outcome: Not Progressing  11/15/2021 2315 by Kerry Fort, RN  Outcome: Progressing

## 2021-11-16 NOTE — Plan of Care (Signed)
Problem: Skin/Tissue Integrity  Goal: Absence of new skin breakdown  Description: 1.  Monitor for areas of redness and/or skin breakdown  2.  Assess vascular access sites hourly  3.  Every 4-6 hours minimum:  Change oxygen saturation probe site  4.  Every 4-6 hours:  If on nasal continuous positive airway pressure, respiratory therapy assess nares and determine need for appliance change or resting period.  11/16/2021 2240 by Kerry Fort, RN  Outcome: Progressing  11/16/2021 1211 by Marin Comment, RN  Outcome: Not Progressing     Problem: Discharge Planning  Goal: Discharge to home or other facility with appropriate resources  11/16/2021 1211 by Marin Comment, RN  Outcome: Progressing     Problem: Pain  Goal: Verbalizes/displays adequate comfort level or baseline comfort level  11/16/2021 2240 by Kerry Fort, RN  Outcome: Progressing  11/16/2021 1211 by Marin Comment, RN  Outcome: Progressing     Problem: Safety - Adult  Goal: Free from fall injury  11/16/2021 2240 by Kerry Fort, RN  Outcome: Progressing  11/16/2021 1211 by Marin Comment, RN  Outcome: Progressing     Problem: ABCDS Injury Assessment  Goal: Absence of physical injury  11/16/2021 2240 by Kerry Fort, RN  Outcome: Progressing  11/16/2021 1211 by Marin Comment, RN  Outcome: Progressing     Problem: Skin/Tissue Integrity  Goal: Absence of new skin breakdown  Description: 1.  Monitor for areas of redness and/or skin breakdown  2.  Assess vascular access sites hourly  3.  Every 4-6 hours minimum:  Change oxygen saturation probe site  4.  Every 4-6 hours:  If on nasal continuous positive airway pressure, respiratory therapy assess nares and determine need for appliance change or resting period.  11/16/2021 2240 by Kerry Fort, RN  Outcome: Progressing  11/16/2021 1211 by Marin Comment, RN  Outcome: Not Progressing

## 2021-11-16 NOTE — Care Coordination-Inpatient (Addendum)
CM reviewed Pt medicals, Per Pt she will D/C back to [facility].    [facility] stated that they will have to get auth again from [employer].    CM sent updated medicals.     Need PT/OT eval.

## 2021-11-16 NOTE — Progress Notes (Signed)
4 Eyes Skin Assessment     NAME:  [name]  DATE OF BIRTH:  [birth year]  MEDICAL RECORD NUMBER:  [id number]    The patient is being assessed for  Other weekly    I agree that at least one RN has performed a thorough Head to Toe Skin Assessment on the patient. ALL assessment sites listed below have been assessed.      Areas assessed by both nurses:    Head, Face, Ears, Shoulders, Back, Chest, Arms, Elbows, Hands, Sacrum. Buttock, Coccyx, Ischium, Legs. Feet and Heels, and Under Medical Devices         Does the Patient have a Wound? Yes wound(s) were present on assessment. LDA wound assessment was Initiated and completed by RN       Braden Prevention initiated by RN: Yes  Wound Care Orders initiated by RN: Yes    Pressure Injury (Stage 3,4, Unstageable, DTI, NWPT, and Complex wounds) if present, place Wound referral order by RN under ORDER ENTRY: No    New Ostomies, if present place, Ostomy referral order under ORDER ENTRY: No     Nurse 1 eSignature: Electronically signed by Marin Comment, RN on 11/16/21 at 5:35 PM EDT    **SHARE this note so that the co-signing nurse can place an eSignature**    Nurse 2 eSignature: Electronically signed by Elsie Ra, RN on 11/16/21 at 5:45 PM EDT

## 2021-11-16 NOTE — Progress Notes (Cosign Needed)
Hospitalist Progress Note    NAME:   [name]   DOB: Dec 11, 1945   MRN: [id number]     Date/Time: 11/16/2021 9:56 AM  Patient PCP: Vevelyn Francois, APRN - NP    Estimated discharge date: >48 hours  [facility]  [name] is a 76 year old female with PMH of COPD, HTN and hyperlipidemia that presented to the ER on 10/23 complaining of generalized fatigue and watery diarrhea that has progressively worsened the past couple of days. She was recently admitted with colitis suspected infectious process on 9/26 and discharged on 9/29 to [facility] with Ciprofloxacin and Flagyl.     In the ER, she was given a bolus of NS and potassium repletion for her potassium of 3.1. CT abdomen pelvis with IV contrast on 10/23 reveals sigmoid diverticulosis without identified diverticulitis with no identified cause for diarrhea. Chest- xray on 10/23 revealed no acute process. C diff positive    Assessment / Plan:    C. Difficile colitis, likely due to recent hospitalization and antibiotic use  - CT abdomen pelvis with IV contrast on 10/23 reveals sigmoid diverticulosis without identified diverticulitis with no identified cause for diarrhea.   - C. Difficile lab test results positive  -WBC 33.7--> 37-->26  -Blood cultures neg  UA and culture pending  -Hold Pepcid   Continue vancomycin PO    2. Dehydration  - BUN improved 32-->18  Creatinine nml  - Continue .45 infusion    3. Hypokalemia  - Potassium is 2.7  Replete    4. Essential HTN  Continue metoprolol    5. Hyperlipemia  -Continue atorvastatin 40 mg     6. L2 compression fracture  - CT abd and pelvis on 10/23 revealed a age-indeterminate L2 compression deformity with 50% height loss and posterior retropulsed fragments moderately narrowing the canal.  - Advise patient to consider management as outpatient    7. Depression  -Continue Remeron     8.  Ground-level fall with bruising on the left supraorbital region  CT of the head and maxillofacial  bones    Medical Decision Making:   I personally reviewed labs: WBC 26, potassium 2.7  I personally reviewed imaging: CT abdomen, chest xray  Toxic drug monitoring: none  Discussed case with: Patient        Code Status: FULL CODE  DVT Prophylaxis: SCD  GI Prophylaxis: None    Subjective:     Chief Complaint / Reason for Physician Visit  Watery diarrhea for a few days with generalized fatigue. Cramping abdominal pain improved.  Discussed with RN events overnight.       Objective:     VITALS:   Last 24hrs VS reviewed since prior progress note. Most recent are:  Patient Vitals for the past 24 hrs:   BP Temp Temp src Pulse Resp SpO2 Height   11/16/21 0734 129/73 97.7 F (36.5 C) Axillary 92 16 95 % --   11/16/21 0530 -- -- -- -- 20 -- --   11/16/21 0503 110/69 98.2 F (36.8 C) Oral (!) 104 -- 96 % --   11/16/21 0044 111/80 -- -- (!) 111 -- -- --   11/15/21 1916 132/85 98.1 F (36.7 C) Oral (!) 110 20 94 % --   11/15/21 1643 (!) 150/81 98.6 F (37 C) Oral (!) 111 30 94 % --   11/15/21 1133 -- -- -- -- -- -- 1.6 m (5' 2.99")  Intake/Output Summary (Last 24 hours) at 11/16/2021 0956  Last data filed at 11/16/2021 0446  Gross per 24 hour   Intake 1418.33 ml   Output --   Net 1418.33 ml          I had a face to face encounter and independently examined this patient on 11/16/2021, as outlined below:    Review of Systems   Respiratory:  Negative for cough and shortness of breath.    Cardiovascular:  Negative for chest pain and leg swelling.   Gastrointestinal:  Positive for diarrhea. Negative for vomiting.   Genitourinary: Negative.         PHYSICAL EXAM:  Physical Exam  Constitutional:       Comments: Weak appearing.   HENT:      Head: Normocephalic and atraumatic.      Comments: Left supraorbital bruising from a recent fall  Cardiovascular:      Rate and Rhythm: Normal rate and regular rhythm.   Pulmonary:      Effort: Pulmonary effort is normal.      Breath sounds: Normal breath sounds.   Abdominal:       Palpations: Abdomen is soft.      Tenderness: There is abdominal tenderness. There is no guarding.   Musculoskeletal:         General: No swelling or tenderness.      Cervical back: Normal range of motion.   Skin:     General: Skin is warm and dry.      Coloration: Skin is pale.      Comments: Increased skin turgor.    Neurological:      Mental Status: She is alert.          Reviewed most current lab test results and cultures  YES  Reviewed most current radiology test results   YES  Review and summation of old records today    NO  Reviewed patient's current orders and MAR    YES  PMH/SH reviewed - no change compared to H&P  ________________________________________________________________________  Care Plan discussed with:    Comments   Patient x    Family      RN x    Care Manager     Consultant                        Multidiciplinary team rounds were held today with case manager, nursing, pharmacist and Higher education careers adviser.  Patient's plan of care was discussed; medications were reviewed and discharge planning was addressed.     ________________________________________________________________________  Total NON critical care TIME:  35  Minutes    Total CRITICAL CARE TIME Spent:   Minutes non procedure based      Comments   >50% of visit spent in counseling and coordination of care     ________________________________________________________________________  Elsworth Soho, PA-C     Procedures: see electronic medical records for all procedures/Xrays and details which were not copied into this note but were reviewed prior to creation of Plan.      LABS:  I reviewed today's most current labs and imaging studies.  Pertinent labs include:  Recent Labs     11/14/21  2015 11/15/21  0554 11/16/21  0639   WBC 33.7* 37.0* 26.1*   HGB 11.6 11.9 9.8*   HCT 36.3 37.2 29.7*   PLT 483* 421* 351       Recent Labs     11/14/21  2015 11/15/21  0554 11/16/21  0639   NA 137 141 137   K 3.1* 3.3* 2.7*   CL 104 110* 104   CO2 25  24 25    BUN 32* 18 9   MG 2.1  --   --    PHOS  --  2.1*  --    ALT 19  --  12         Signed: Elsworth Soho, PA-C

## 2021-11-17 LAB — COMPREHENSIVE METABOLIC PANEL
ALT: 14 U/L (ref 12–78)
AST: 8 U/L — ABNORMAL LOW (ref 15–37)
Albumin/Globulin Ratio: 0.7 — ABNORMAL LOW (ref 1.1–2.2)
Albumin: 2.3 g/dL — ABNORMAL LOW (ref 3.5–5.0)
Alk Phosphatase: 94 U/L (ref 45–117)
Anion Gap: 3 mmol/L — ABNORMAL LOW (ref 5–15)
BUN: 6 mg/dL (ref 6–20)
Bun/Cre Ratio: 13 (ref 12–20)
CO2: 26 mmol/L (ref 21–32)
Calcium: 8.3 mg/dL — ABNORMAL LOW (ref 8.5–10.1)
Chloride: 106 mmol/L (ref 97–108)
Creatinine: 0.46 mg/dL — ABNORMAL LOW (ref 0.55–1.02)
Est, Glom Filt Rate: >60 mL/min/{1.73_m2} (ref >60)
Globulin: 3.1 g/dL (ref 2.0–4.0)
Glucose: 93 mg/dL (ref 65–100)
Potassium: 4.7 mmol/L (ref 3.5–5.1)
Sodium: 135 mmol/L — ABNORMAL LOW (ref 136–145)
Total Bilirubin: 0.3 mg/dL (ref 0.2–1.0)
Total Protein: 5.4 g/dL — ABNORMAL LOW (ref 6.4–8.2)

## 2021-11-17 LAB — CBC WITH AUTO DIFFERENTIAL
Absolute Immature Granulocyte: 0 10*3/uL
Basophils %: 0 % (ref 0–1)
Basophils Absolute: 0 10*3/uL (ref 0.0–0.1)
Eosinophils %: 4 % (ref 0–7)
Eosinophils Absolute: 0.8 10*3/uL — ABNORMAL HIGH (ref 0.0–0.4)
Hematocrit: 34 % — ABNORMAL LOW (ref 35.0–47.0)
Hemoglobin: 11.1 g/dL — ABNORMAL LOW (ref 11.5–16.0)
Immature Granulocytes: 0 %
Lymphocytes %: 14 % (ref 12–49)
Lymphocytes Absolute: 2.8 10*3/uL (ref 0.8–3.5)
MCH: 29 PG (ref 26.0–34.0)
MCHC: 32.6 g/dL (ref 30.0–36.5)
MCV: 88.8 FL (ref 80.0–99.0)
MPV: 9 FL (ref 8.9–12.9)
Monocytes %: 5 % (ref 5–13)
Monocytes Absolute: 1 10*3/uL (ref 0.0–1.0)
Neutrophils %: 77 % — ABNORMAL HIGH (ref 32–75)
Neutrophils Absolute: 15.7 10*3/uL — ABNORMAL HIGH (ref 1.8–8.0)
Nucleated RBCs: 0 PER 100 WBC
Platelets: 415 10*3/uL — ABNORMAL HIGH (ref 150–400)
RBC: 3.83 M/uL (ref 3.80–5.20)
RDW: 14.4 % (ref 11.5–14.5)
WBC: 20.3 10*3/uL — ABNORMAL HIGH (ref 3.6–11.0)
nRBC: 0 10*3/uL (ref 0.00–0.01)

## 2021-11-17 LAB — CULTURE, URINE: Colony count: 2000

## 2021-11-17 MED ORDER — PETROLATUM-ZINC OXIDE 57-17 % EX PSTE
57-17 | Freq: Three times a day (TID) | CUTANEOUS | Status: DC
Start: 2021-11-17 — End: 2021-11-21
  Administered 2021-11-17 – 2021-11-21 (×12): via TOPICAL

## 2021-11-17 MED ORDER — SODIUM CHLORIDE 0.45 % IV SOLN
0.45 | INTRAVENOUS | Status: DC
Start: 2021-11-17 — End: 2021-11-21
  Administered 2021-11-17 – 2021-11-20 (×3): via INTRAVENOUS

## 2021-11-17 MED FILL — POTASSIUM CHLORIDE IN NACL 20-0.45 MEQ/L-% IV SOLN: INTRAVENOUS | Qty: 1000

## 2021-11-17 MED FILL — MIRTAZAPINE 15 MG PO TABS: 15 MG | ORAL | Qty: 1

## 2021-11-17 MED FILL — METOPROLOL TARTRATE 25 MG PO TABS: 25 MG | ORAL | Qty: 1

## 2021-11-17 MED FILL — ONDANSETRON HCL 4 MG/2ML IJ SOLN: 4 MG/2ML | INTRAMUSCULAR | Qty: 2

## 2021-11-17 MED FILL — POTASSIUM CHLORIDE CRYS ER 20 MEQ PO TBCR: 20 MEQ | ORAL | Qty: 2

## 2021-11-17 NOTE — Plan of Care (Signed)
OCCUPATIONAL THERAPY EVALUATION  Patient: [name] (76 y.o. female)  Date: 11/17/2021  Primary Diagnosis: Dehydration [E86.0]  Hypokalemia [E87.6]  Diarrhea, unspecified type [R19.7]       Precautions: Bed Alarm, Fall Risk, Contact Precautions                Recommendations for nursing mobility: Encourage HEP in prep for ADLs/mobility; see handout for details, Frequent repositioning to prevent skin breakdown, Use of bed/chair alarm for safety, and Assist x1    In place during session:Peripheral IV, External Catheter, and EKG/telemetry   ASSESSMENT  Pt is a 76 y.o. female presenting to [facility] with c/o diarrhea and generalized weakness, admitted 11/14/21 and currently being treated for suspected C. Difficile Colitis, dehydration, HTN, hypokalemia, hyperlipidemia, and L2 compression fracture. Pt received semi-supine in bed upon arrival, AXO x person and place and disoriented to time, and agreeable to OT evaluation.     Based on current observations, pt presents with decreased  functional mobility, independence in ADLs, high-level IADLs, ROM, strength, body mechanics, activity tolerance, cognition, increased pain levels (see below for objective details and assist levels).     Overall, pt tolerates session fair with c/o back pain with activity. Pt able to complete grooming with set up A while semi supine in bed. Once finished with grooming, RN entered room and reported pt's HR elevated to 140s so further mobility deferred this date. Pt rolled side to side in bed with min A to complete peri care with total A. Pt will benefit from continued skilled OT services to address current impairments and improve IND and safety with self cares and functional transfers/mobility. Potential barriers for safe discharge: pt has poor safety awareness, pt has impaired cognition, pt is a high fall risk, and pt is not safe to be alone. Current OT d/c recommendation Skilled [place] medically appropriate.     Start of Session End  of Session   Heart Rate (BPM) 106 ~120s     GOALS:    Problem: Occupational Therapy - Adult  Goal: By Discharge: Performs self-care activities at highest level of function for planned discharge setting.  See evaluation for individualized goals.  Description: FUNCTIONAL STATUS PRIOR TO ADMISSION:  Pt was using RW and req'd assistance for functional mobility. Pt was req'ing assistance for ADLs.    HOME SUPPORT: The pt was a LTC resident and req'd assistance with ADLs.    Occupational Therapy Goals:  Initiated 11/17/2021  Patient/Family stated goal: Walk to the bathroom  1.  Patient will perform grooming with Stand by Assist within 7 day(s).  2.  Patient will perform bathing with Contact Guard Assist within 7 day(s).  3.  Patient will perform lower body dressing with Contact Guard Assist within 7 day(s).  4.  Patient will perform toilet transfers with Contact Guard Assist  within 7 day(s).  5.  Patient will perform all aspects of toileting with Contact Guard Assist within 7 day(s).  6.  Patient will participate in upper extremity therapeutic exercise/activities with Independence within 7 day(s).    7.  Patient will utilize energy conservation techniques during functional activities with verbal cues within 7 day(s).  Outcome: Progressing        PLAN :  Recommendations and Planned Interventions: self care training, therapeutic activities, functional mobility training, balance training, therapeutic exercise, endurance activities, patient education, and home safety training    Recommend next OT session: LB dressing and Seated grooming    Frequency/Duration: Patient will be followed by occupational  therapy:  3-5x/week to address goals.    Recommendation for discharge: (in order for the patient to meet his/her long term goals)  [place]    IF patient discharges home will need the following DME: continuing to assess with progress       SUBJECTIVE:   Patient stated "I remember you."    OBJECTIVE DATA SUMMARY:      Past Medical History:   Diagnosis Date    Chronic obstructive pulmonary disease ([place])     Hypercholesteremia     Insomnia     Sleep disorder      Past Surgical History:   Procedure Laterality Date    CT GUIDED CHEST TUBE  12/06/2018    CT GUIDED CHEST TUBE    ORTHOPEDIC SURGERY  05/26/2021    LEFT HIP REVISION POSTERIOR APPROACH WITH ALLOGRAFT    PARTIAL HYSTERECTOMY (CERVIX NOT REMOVED)      SHOULDER SURGERY Left           Expanded or extensive additional review of patient history:   Lives With: Other (comment) (LTC resident)  Type of Home: Facility  Home Layout: One level  Home Equipment: Wheelchair-manual, Radio producer  Social/Functional History  Lives With: Other (comment) (LTC resident)  Type of Home: Facility  Home Layout: One level  Home Equipment: [place], Government social research officer Help From:  (nursing facility)  ADL Assistance: Needs assistance  Bath: Minimal assistance  Dressing: Minimal assistance  Grooming: Minimal assistance  Feeding: Independent  Toileting: Needs assistance  Homemaking Assistance: Needs assistance  Ambulation Assistance: Needs assistance  Transfer Assistance: Needs assistance  Active Driver: No    Hand Dominance: right     EXAMINATION OF PERFORMANCE DEFICITS:    Cognitive/Behavioral Status:  Orientation  Orientation Level: Oriented to person;Oriented to place;Disoriented to time  Cognition  Overall Cognitive Status: Exceptions  Arousal/Alertness: Appropriate responses to stimuli  Following Commands: Follows one step commands consistently  Attention Span: Appears intact  Memory: Appears intact  Safety Judgement: Decreased awareness of need for assistance;Decreased awareness of need for safety  Initiation: Requires cues for some  Sequencing: Requires cues for some    Range of Motion:   AROM: Generally decreased, functional       Strength:  Strength: Generally decreased, functional    Coordination:  Coordination: Within functional limits            Tone & Sensation:   Tone: Normal          Functional Mobility and Transfers for ADLs:  Bed Mobility:  Bed Mobility Training  Bed Mobility Training: Yes  Interventions: Verbal cues;Tactile cues  Rolling: Minimum assistance  Supine to Sit: Contact-guard assistance  Sit to Supine: Contact-guard assistance  Scooting: Maximum assistance;Assist X2    Transfers:  Pharmacologist: No  Sit to Stand: Contact-guard assistance  Stand to Sit: Contact-guard assistance  Stand Pivot Transfers: Contact-guard assistance      Balance:  Balance  Sitting: Intact  Standing: Impaired  Standing - Static: Fair;Constant support  Standing - Dynamic: Fair;Constant support      ADL Assessment:  Feeding: Setup  Feeding Skilled Clinical Factors: Finishing lunch when entering room    Grooming: Setup  Grooming Skilled Clinical Factors: To provide pt a wet wash cloth and a comb    Toileting: Dependent/Total  Toileting Skilled Clinical Factors: Total A for posterior and anterior peri care while rolling side to side in bed      Functional Measure:    [facility] AM-PACTM "6 Clicks"                                                       Daily Activity Inpatient Short Form  How much help from another person does the patient currently need... Total; A Lot A Little None   1.  Putting on and taking off regular lower body clothing? _0   1 _1   2 _2   3 _3   4   2.  Bathing (including washing, rinsing, drying)? _4   1 _5   2 _6   3 _7   4   3.  Toileting, which includes using toilet, bedpan or urinal? _8  1 _9   2 _10   3 _11   4   4.  Putting on and taking off regular upper body clothing? _12   1 _13   2 _14   3 _15   4   5.  Taking care of personal grooming such as brushing teeth? _16   1 _17   2 _18   3 _19   4   6.  Eating meals? _20   1 _21   2 _22   3 _23   4    2007, Trustees of [facility] [employer] [facility], under license to [place]. All rights reserved     Score: 14/24     Interpretation of Tool:  Represents clinically-significant functional categories (i.e. Activities of daily  living).  Percentage of Impairment CH    0%   CI    1-19% CJ    20-39% CK    40-59% CL    60-79% CM    80-99% CN     100%   AMPAC  Score 6-_24 20-22 15-19 10-14 7-9 6     Occupational Therapy Evaluation Charge Determination   History Examination Decision-Making   LOW Complexity : Brief history review  MEDIUM Complexity: 3-5 Performance deficits relating to physical, cognitive, or psychosocial skills that result in activity limitations and/or participation restrictions MEDIUM Complexity: Patient may present with comorbidities that affect occupational performance. Minimal to moderate modifications of tasks or assist (eg. physical or verbal) with assist is necessary to enable pt to complete eval      Based on the above components, the patient evaluation is determined to be of the following complexity level: Low    Pain Rating:  Reported back pain with rolling in bed, did not rate   Pain Intervention(s):   nursing notified and repositioning    Activity Tolerance:   Fair  and requires rest breaks    After treatment patient left in no apparent distress:    Patient left in no apparent distress in bed, Call bell within reach, Bed/ chair alarm activated, and Side rails x3, bed locked and in lowest position    COMMUNICATION/EDUCATION:   The patient's plan of care was discussed with: Registered nurse    Patient Education  Education Given To: Patient  Education Provided: Role of Therapy;ADL Adaptive Strategies;Energy Conservation;Orientation;Plan of Care  Education Method: Verbal  Barriers to Learning: Cognition  Education Outcome: Verbalized understanding;Continued education needed    The supervising occupational therapist and treating occupational therapist assistant have met to review this patient's progress and plan of care.    This patient's plan of care is appropriate for delegation to OTA.     Thank you for this referral,  Thom Chimes, OT  Minutes: 25

## 2021-11-17 NOTE — Care Coordination-Inpatient (Signed)
CM reviewed Pt medicals, sent PT notes to [facility], asked them to start [place].

## 2021-11-17 NOTE — Wound Image (Signed)
IP WOUND CONSULT    [name]  MEDICAL RECORD NUMBER:  [id number]  AGE: 76 y.o.   GENDER: female  DOB: 07/04/1945  TODAY'S DATE:  11/17/2021    GENERAL     []  Follow-up   [x]  New Consult    [name] is a 76 y.o. female referred by:   [x]  Physician  []  Nursing  []  Other:         PAST MEDICAL HISTORY    Past Medical History:   Diagnosis Date    Chronic obstructive pulmonary disease (H[facility])     Hypercholesteremia     Insomnia     Sleep disorder         PAST SURGICAL HISTORY    Past Surgical History:   Procedure Laterality Date    CT GUIDED CHEST TUBE  12/06/2018    CT GUIDED CHEST TUBE    ORTHOPEDIC SURGERY  05/26/2021    LEFT HIP REVISION POSTERIOR APPROACH WITH ALLOGRAFT    PARTIAL HYSTERECTOMY (CERVIX NOT REMOVED)      SHOULDER SURGERY Left        FAMILY HISTORY    Family History   Family history unknown: Yes         ALLERGIES    Allergies   Allergen Reactions    Cheese Nausea And Vomiting     Severe vomiting  Severe vomiting  Severe vomiting       MEDICATIONS    No current facility-administered medications on file prior to encounter.     Current Outpatient Medications on File Prior to Encounter   Medication Sig Dispense Refill    metoprolol tartrate (LOPRESSOR) 25 MG tablet Take 0.5 tablets by mouth 2 times daily      mirtazapine (REMERON) 30 MG tablet Take 1 tablet by mouth nightly      atorvastatin (LIPITOR) 40 MG tablet Take 1 tablet by mouth daily      aspirin 81 MG EC tablet Take 1 tablet by mouth daily 30 tablet 3    albuterol sulfate HFA (PROVENTIL;VENTOLIN;PROAIR) 108 (90 Base) MCG/ACT inhaler albuterol sulfate HFA 90 mcg/actuation aerosol inhaler   INHALE 2 PUFFS EVERY 6 HOURS AS NEEDED      omeprazole (PRILOSEC) 20 MG delayed release capsule Take 1 capsule by mouth daily            BP (!) 142/79   Pulse 92   Temp 97.9 F (36.6 C) (Oral)   Resp 14   Ht 1.6 m (5' 2.99")   Wt 40.8 kg (90 lb)   SpO2 98%   BMI 15.95 kg/m     ASSESSMENT     Wound Identification & Type: 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  Dressing change: applied zinc paste   Verbal consent for picture: Yes    Contributing Factors: decreased mobility, shear force, malnutrition, incontinence of stool, and incontinence of urine    Wound 11/15/21 Perineum Patient has a red, blanchable sacrum/perineum pressure injury, skin is intact (Active)   Wound Image   11/17/21 1207   Wound Etiology Other 11/17/21 1207   Dressing Status New dressing applied 11/17/21 1207   Wound Cleansed Other (Comment) 11/17/21 1207   Dressing/Treatment Zinc paste 11/17/21 1207   Wound Assessment Pink/red;Erythema 11/17/21 1207   Drainage Amount None (dry) 11/17/21 1207   Odor None 11/17/21 1207   Peri-wound Assessment Denuded;Fragile 11/17/21 1207   Number of days: 2          PLAN  Skin Care & Pressure Relief Recommendations  Specialty Bed, Minimize Layers of Linen, Turn/reposition approximately every 2 hours, Pillow / Wedges, Manage incontinence, Promote continence; Skin protective lotion/cream to buttocks and sacrum daily and as needed with incontinence care, and Floating heels    [place]: 15  Blood Glucose: 93 on 11/17/21                            WBCs: 20.3 on 11/16/21    Nutritionist Consulted: Yes  Nutrition Status: TBD    Support Surface: attached Isoflex air pump for increased pressure reduction and microclimate control    Physician/Provider notified:   Recommendations: MASD r/t GI and GU incontinence to buttocks and perineum.  Apply zinc paste to affected areas TID and prn for soiling.  If soiled, remove top soiled layer only and reapply.  Use Remedy Phytoplex Barrier Cream wipes for gentle cleansing.  To completely remove, use Remedy Foaming Cleanser or soap and water.  If watery stool persists, patient may benefit from a fecal management system.     Apply Optifoam Border 6x6 foam dressing to both hips /trochanters every three days and prn for soiling to prevent pressure and friction related skin injury.  Rn is to gently peel back foam for shift  integumentary assessment and re-secure if not soiled.  Maintain the the external GU incontinence management system to manage GU incontinence.  Use foam wedge to turn q2h at 30 degree angle to offload sacrum.  Float heels with 1-2 pillows lengthwise while in bed for offloading of the heels.  Maintain HOB at 30 degrees or less, if not contraindicated, to reduce pressure to buttocks and sacrum.  Raise foot of bed to help prevent friction and shear injury from sliding down in the bed.  Will continue to follow weekly while in-patient.      Discharge Wound Care Needs: TBD    Teaching completed with:   []  Patient           []  Family member       []  Caregiver          []  Nursing  []  Other    Patient/Caregiver Teaching:  Level of patient/caregiver understanding able to:   []  Indicates understanding       []  Needs reinforcement  []  Unsuccessful      []  Verbal Understanding  []  Demonstrated understanding       []  No evidence of learning  []  Refused teaching         []  N/A       Electronically signed by Army Melia, RN on 11/17/2021 at 12:16 PM

## 2021-11-17 NOTE — Progress Notes (Signed)
Comprehensive Nutrition Assessment    Type and Reason for Visit:  Reassess (Interim)    Nutrition Recommendations/Plan:   Continue current ETC, high fiber diet   Ensure Enlive x2/d (7100kcals, 40g pro)  Monitor and document all PO/ONS intakes and Bms in I/Os     Malnutrition Assessment:  Malnutrition Status:  Insufficient data (11/15/21 1143)    Context:  Acute Illness     Findings of the 6 clinical characteristics of malnutrition:  Energy Intake:  Unable to assess  Weight Loss:  No significant weight loss (7.1% x33mo     Body Fat Loss:  Unable to assess     Muscle Mass Loss:  Unable to assess    Fluid Accumulation:  No significant fluid accumulation    Grip Strength:  Not Performed    Nutrition Assessment:    Pt admitted for generalized fatigue xfew mo w/worsening diarrhea. Noted recent admit w/colitis dx-now +diverticulosis w/potential cdiff per CT of abdomen. Pt screened for low BMI (15.9) and MST 3 for >14# unintentional wt loss pta. Pt very confused upon RD assessment, UTO nutr/wt hx. Per EMR, 8# wt loss x517mo7.1%, insignificant). Pt on ETC diet w/ONS, noted untouched L tray at bedside. Provide assitance and encouragement at mealtimes, will modify to high fiber and cnt ONS to aid in wt gain. Consider bannatrol for cdiff. (10/26) Pt reports doing much better, consumed >50% L and drinking ONS upon assessment. Provided brief edu on diverticulosis, will cnt recs above. Labs: Na 135, Cr 0.46, ca 8.3, H/H: 11.1/34. Meds: pepcid, remeron, lopressor.    Nutrition Related Findings:    NFPE deferred 2/2 contact precautions. No n/v/c nor h/o c/s issues. Last BM 10/26, watery-improving per pt. No edema. Wound Type: Pressure Injury (Perineum)       Current Nutrition Intake & Therapies:    Average Meal Intake: 51-75%  Average Supplements Intake: 76-100%  ADULT DIET; Easy to Chew; High Fiber  ADULT ORAL NUTRITION SUPPLEMENT; Breakfast, Dinner; Standard High Calorie/High Protein Oral Supplement    Anthropometric  Measures:  Height: 160 cm (5' 2.99")  Ideal Body Weight (IBW): 115 lbs (52 kg)       Current Body Weight: 54.3 kg (119 lb 11.4 oz) (10/26), 104.1 % IBW. Weight Source: Bed Scale  Current BMI (kg/m2): 21.2        Weight Adjustment For: No Adjustment                 BMI Categories: Underweight (BMI less than 22) age over 659  Estimated Daily Nutrient Needs:  Energy Requirements Based On: Kcal/kg  Weight Used for Energy Requirements: Current  Energy (kcal/day): 1629-1900kcals/d (30-35kcal/kg, wt gain)  Weight Used for Protein Requirements: Current  Protein (g/day): 65g/d (1.2g/kg)  Method Used for Fluid Requirements: 1 ml/kcal  Fluid (ml/day): 1629-190011m  Nutrition Diagnosis:   Underweight related to inadequate protein-energy intake as evidenced by BMI    Nutrition Interventions:   Food and/or Nutrient Delivery: Continue Current Diet, Continue Oral Nutrition Supplement  Nutrition Education/Counseling: Education completed (diverticulosis)  Coordination of Nutrition Care: Continue to monitor while inpatient  Plan of Care discussed with: Pt    Goals:  Previous Goal Met: Goal(s) Achieved  Goals: PO intake 50% or greater, Meet at least 75% of estimated needs, by next RD assessment     Nutrition Monitoring and Evaluation:   Behavioral-Environmental Outcomes: Knowledge or Skill  Food/Nutrient Intake Outcomes: Diet Advancement/Tolerance, Food and Nutrient Intake, Supplement Intake  Physical Signs/Symptoms Outcomes: Biochemical  Data, Diarrhea, GI Status, Meal Time Behavior, Skin, Weight    Discharge Planning:    Continue Oral Nutrition Supplement, Continue current diet     Kyra Manges, [place]  Contact: [phone]

## 2021-11-17 NOTE — Plan of Care (Signed)
PHYSICAL THERAPY EVALUATION  Patient: [name] (76 y.o. female)  Date: 11/17/2021  Primary Diagnosis: Dehydration [E86.0]  Hypokalemia [E87.6]  Diarrhea, unspecified type [R19.7]       Precautions: Contact Precautions                Recommendations for nursing mobility: Out of bed to chair for meals, Use of bed/chair alarm for safety, and Assist x1    In place during session: Peripheral IV  ASSESSMENT  Pt is a 76 y.o. female admitted on 11/14/2021 for dehydration/diarrhea/cdiff presents to PT with decreased bed mob, transfers, LE strength, gt, activity tolerance, balance, and overall functional mobility.   Pt semi supine in bed upon PT arrival, agreeable to evaluation. Pt A&O to self/place, confused to time/situation.  Per chart pt is a LTC resident at [facility].    Based on the objective data described below, the patient currently presents with impaired functional mobility, decreased ROM, impaired strength, decreased activity tolerance, poor safety awareness, and impaired balance. (See below for objective details and assist levels).     Overall pt tolerated session fair today with overall CGA/min A with bed mob and OOB transfers.  Pt was able to amb approx 67ft at bedside with HHAx1 and CGA/min Ax1 but started having BM and therefore amb was limited.  Pt was assisted back to semi supine in bed and assisted with getting cleaned up and gown changed. Pt will benefit from continued skilled PT to address above deficits and return to PLOF. Current PT DC recommendation [place] once medically appropriate.      GOALS:  FUNCTIONAL STATUS PRIOR TO ADMISSION: LTC resident, req A with ADLS and amb    Physical Therapy Goals  Initiated 11/17/2021  Pt stated goal: to get better    I with LE HEP x7 days  Mod I with bed mob x7 days  SBA with all transfers x7 days  Amb 25-66ft with LRAD and SBAx1 x7 days         PLAN :  Recommendations and Planned Interventions: bed mobility training, transfer training, gait training,  therapeutic exercises, patient and family training/education, and therapeutic activities    Frequency/Duration: Patient will be followed by physical therapy:  3-5x/week to address goals.    Recommendation for discharge: (in order for the patient to meet his/her long term goals)  [place]:   Patient semi supine in bed upon PT arrival, agreeable to work with PT    OBJECTIVE DATA SUMMARY:   HISTORY:    Past Medical History:   Diagnosis Date    Chronic obstructive pulmonary disease ([place])     Hypercholesteremia     Insomnia     Sleep disorder      Past Surgical History:   Procedure Laterality Date    CT GUIDED CHEST TUBE  12/06/2018    CT GUIDED CHEST TUBE    ORTHOPEDIC SURGERY  05/26/2021    LEFT HIP REVISION POSTERIOR APPROACH WITH ALLOGRAFT    PARTIAL HYSTERECTOMY (CERVIX NOT REMOVED)      SHOULDER SURGERY Left        Home Situation:  Social/Functional History  Lives With: Other (comment) (LTC resident)  Type of Home: Facility  Home Layout: One level  Home Equipment: Wheelchair-manual, Government social research officer Help From:  (nursing facility)  ADL Assistance: Needs assistance  Bath: Minimal assistance  Dressing: Minimal assistance  Grooming: Minimal assistance  Feeding: Independent  Toileting:  Needs assistance  Homemaking Assistance: Needs assistance  Ambulation Assistance: Needs assistance  Transfer Assistance: Needs assistance  Active Driver: No    Cognitive/Behavioral Status:  Orientation  Orientation Level: Oriented to place;Oriented to person;Disoriented to time;Disoriented to situation  Cognition  Overall Cognitive Status: Exceptions  Safety Judgement: Decreased awareness of need for assistance;Decreased awareness of need for safety  Initiation: Requires cues for some  Sequencing: Requires cues for some    Skin: intact where exposed    Edema: none noted    Strength:    Strength: Generally decreased, functional B LE grossly 3+/5    Range Of Motion:  AROM: Generally decreased, functional B  LE       Tone & Sensation:      Sensation: Intact to LT B LE      Functional Mobility:  Bed Mobility:     Bed Mobility Training  Bed Mobility Training: Yes  Rolling: Minimum assistance  Supine to Sit: Contact-guard assistance  Sit to Supine: Contact-guard assistance  Scooting: Contact-guard assistance    Transfers:     Art therapist: Yes  Sit to Stand: Contact-guard assistance  Stand to Sit: Contact-guard assistance  Stand Pivot Transfers: Contact-guard assistance    Balance:     Balance  Sitting: Intact  Standing: Impaired  Standing - Static: Fair;Constant support  Standing - Dynamic: Fair;Constant support    Ambulation/Gait Training:    Gait  Overall Level of Assistance: Minimum assistance;Other (comment) (hand held assist)  Distance (ft): 4 Feet  Assistive Device: Gait belt (hand held assist, would likely need walker for further distances)  Base of Support: Narrowed  Speed/Cadence: Slow;Shuffled  Gait Abnormalities: Shuffling gait      Functional Measure:  [employer] AM-PACT "6 Clicks"         Basic Mobility Inpatient Short Form  How much difficulty does the patient currently have... Unable A Lot A Little None   1.  Turning over in bed (including adjusting bedclothes, sheets and blankets)?   []  1   []  2   [x]  3   []  4   2.  Sitting down on and standing up from a chair with arms ( e.g., wheelchair, bedside commode, etc.)   []  1   []  2   [x]  3   []  4   3.  Moving from lying on back to sitting on the side of the bed?   []  1   []  2   [x]  3   []  4          How much help from another person does the patient currently need... Total A Lot A Little None   4.  Moving to and from a bed to a chair (including a wheelchair)?   []  1   []  2   [x]  3   []  4   5.  Need to walk in hospital room?   []  1   []  2   [x]  3   []  4   6.  Climbing 3-5 steps with a railing?   []  1   [x]  2   []  3   []  4    2007, Trustees of , under license to [place]. All rights reserved     Score:  Initial:  17/24 Most Recent: X (Date: 11/17/2021 )   Interpretation of Tool:  Represents activities that are increasingly more difficult (i.e. Bed mobility, Transfers, Gait).  Score 24 23 22-20 19-15 14-10 9-7 6  Modifier CH CI CJ CK CL CM CN         Physical Therapy Evaluation Charge Determination   History Examination Presentation Decision-Making   MEDIUM  Complexity : 1-2 comorbidities / personal factors will impact the outcome/ POC  MEDIUM Complexity : 3 Standardized tests and measures addressing body structure, function, activity limitation and / or participation in recreation  LOW Complexity : Stable, uncomplicated  Other outcome measures ampac 6  MEDIUM      Based on the above components, the patient evaluation is determined to be of the following complexity level: LOW    Pain Rating:  No c/o pain during session  Pain Intervention(s):       Activity Tolerance:   Fair     After treatment patient left in no apparent distress:   Bed locked and in lowest position Patient left in no apparent distress in bed, Call bell within reach, Bed/ chair alarm activated, and Side rails x3 and nsg updated.    COMMUNICATION/EDUCATION:   The patient's plan of care was discussed with: Case management    Patient Education  Education Given To: Patient  Education Provided: Plan of Care;Role of Therapy  Education Method: Verbal  Education Outcome: Continued education needed         Thank you for this referral.  Sherle Poe, PT  Minutes: 20

## 2021-11-17 NOTE — Progress Notes (Signed)
Spiritual Care Assessment/Progress Note  [facility]    Name: [name] MRN: [id number]    Age: 76 y.o.     Sex: female   Language: English     Date: 11/17/2021            Total Time Calculated: 10 min              Spiritual Assessment begun in [facility] [place]  Service Provided For:: Patient  Referral/Consult From:: Other (comment) (Spiritual Consult)  Encounter Overview/Reason : Initial Encounter    Spiritual beliefs:      []  Involved in a faith tradition/spiritual practice:      []  Supported by a faith community:      []  Claims no spiritual orientation:      []  Seeking spiritual identity:           []  Adheres to an individual form of spirituality:      [x]  Not able to assess:                Identified resources for coping and support system:   Support System: Friends/neighbors       []  Prayer                  []  Devotional reading               []  Music                  []  Guided Imagery     []  Pet visits                                        []  Other: (COMMENT)     Specific area/focus of visit   Encounter:    Crisis:    Spiritual/Emotional needs: Type: Spiritual Support  Ritual, Rites and Sacraments:    Grief, Loss, and Adjustments:    Ethics/Mediation:    Behavioral Health:    Palliative Care:    Advance Care Planning:      Plan/Referrals: Other (Comment) (Chaplain is available if needed)    Narrative: Chaplain responded to spiritual consult. [name] is lying in bed watching TV. Pt is in good spirits; reports she is feeling better than she has in days. [name] reports she did not request to speak with a Chaplain however pt is welcoming. [name] shares in life review/painful storytelling. Pt's husband passed two years ago and pt admits she is still in shock which has delayed her acceptance of his death. [name] relocated to [facility] after beign encouraged by a friend to leave [place] since pt was alone. Pt has two children, one lives in [place] and the other in [place]. Visit was interrupted  when pt received a phone call. Chaplain waved to the pt and exited the room.    Chaplain listened attentively as pt shared in life review/storytelling. Explored grief coping mechanisms. Provided a peaceful and supportive presence.    Please contact Spiritual Care for any emotional/spiritual needs and/or further referrals. Thank you..    Rev. Aloysius Heinle, MDIV  Chaplain can be reached by calling the operator at [facility]  [phone]

## 2021-11-17 NOTE — Progress Notes (Signed)
Hospitalist Progress Note    NAME:   Brityn Mastrogiovanni   DOB: [birth year]   MRN: [id number]     Date/Time: 11/17/2021 8:12 AM  Patient PCP: Rigoberto Noel, APRN - NP    Estimated discharge date: >48 hours  Barriers: [name] is a 76 year old female with PMH of COPD, HTN and hyperlipidemia that presented to the ER on 10/23 complaining of generalized fatigue and watery diarrhea that has progressively worsened the past couple of days. She was recently admitted with colitis suspected infectious process on 9/26 and discharged on 9/29 to [facility] with Ciprofloxacin and Flagyl. CT abdomen pelvis with IV contrast on 10/23 reveals sigmoid diverticulosis without identified diverticulitis with no identified cause for diarrhea. Chest- xray on 10/23 revealed no acute process. C diff positive    Assessment / Plan:    C. Difficile colitis, likely due to recent hospitalization and antibiotic use  - CT abdomen pelvis with IV contrast on 10/23 reveals sigmoid diverticulosis without identified diverticulitis with no identified cause for diarrhea.   - C. Difficile lab test results positive  -WBC 33.7--> 37-->26-->20  -Blood cultures neg  UA and culture pending  -Hold Pepcid   Continue vancomycin PO    2. Dehydration  - BUN improved 32-->18  Creatinine nml  - Continue .45 infusion    3. Hypokalemia  - Potassium is 2.7-->4.7  Replete    4. Essential HTN  Continue metoprolol    5. Hyperlipemia  -Continue atorvastatin 40 mg     6. L2 compression fracture  - CT abd and pelvis on 10/23 revealed a age-indeterminate L2 compression deformity with 50% height loss and posterior retropulsed fragments moderately narrowing the canal.  - Advise patient to consider management as outpatient    7. Depression  -Continue Remeron     8.  Ground-level fall with bruising on the left supraorbital region  CT of the head and maxillofacial bones    Medical Decision Making:   I personally reviewed labs: WBC 20, potassium  4.7  I personally reviewed imaging: CT abdomen, chest xray  Toxic drug monitoring: none  Discussed case with: Patient        Code Status: FULL CODE  DVT Prophylaxis: SCD  GI Prophylaxis: None    Subjective:     Chief Complaint / Reason for Physician Visit  Watery diarrhea for a few days with generalized fatigue. Cramping abdominal pain improved.  Discussed with RN events overnight.       Objective:     VITALS:   Last 24hrs VS reviewed since prior progress note. Most recent are:  Patient Vitals for the past 24 hrs:   BP Temp Temp src Pulse Resp SpO2   11/17/21 0735 (!) 142/79 97.9 F (36.6 C) -- 92 14 98 %   11/17/21 0450 (!) 142/81 -- -- 94 -- --   11/17/21 0359 (!) 151/102 97.9 F (36.6 C) Oral (!) 102 18 95 %   11/16/21 2119 (!) 148/95 -- -- 92 -- --   11/16/21 1936 (!) 148/95 97.7 F (36.5 C) Oral 92 18 97 %   11/16/21 1612 (!) 160/96 97.7 F (36.5 C) -- 89 15 98 %           Intake/Output Summary (Last 24 hours) at 11/17/2021 0812  Last data filed at 11/17/2021 0538  Gross per 24 hour   Intake 1100 ml   Output 250 ml   Net  850 ml          I had a face to face encounter and independently examined this patient on 11/17/2021, as outlined below:    Review of Systems   Respiratory:  Negative for cough and shortness of breath.    Cardiovascular:  Negative for chest pain and leg swelling.   Gastrointestinal:  Positive for diarrhea. Negative for vomiting.   Genitourinary: Negative.         PHYSICAL EXAM:  Physical Exam  Constitutional:       Comments: Weak appearing.   HENT:      Head: Normocephalic and atraumatic.      Comments: Left supraorbital bruising from a recent fall  Cardiovascular:      Rate and Rhythm: Normal rate and regular rhythm.   Pulmonary:      Effort: Pulmonary effort is normal.      Breath sounds: Normal breath sounds.   Abdominal:      Palpations: Abdomen is soft.      Tenderness: There is no abdominal tenderness. There is no guarding.   Musculoskeletal:         General: No swelling or  tenderness.      Cervical back: Normal range of motion.   Skin:     General: Skin is warm and dry.      Coloration: Skin is pale.      Comments: Increased skin turgor.    Neurological:      Mental Status: She is alert.          Reviewed most current lab test results and cultures  YES  Reviewed most current radiology test results   YES  Review and summation of old records today    NO  Reviewed patient's current orders and MAR    YES  PMH/SH reviewed - no change compared to H&P  ________________________________________________________________________  Care Plan discussed with:    Comments   Patient x    Family      RN x    Care Manager     Consultant                        Multidiciplinary team rounds were held today with case manager, nursing, pharmacist and Higher education careers adviser.  Patient's plan of care was discussed; medications were reviewed and discharge planning was addressed.     ________________________________________________________________________  Total NON critical care TIME:  35  Minutes    Total CRITICAL CARE TIME Spent:   Minutes non procedure based      Comments   >50% of visit spent in counseling and coordination of care     ________________________________________________________________________  Elsworth Soho, PA-C     Procedures: see electronic medical records for all procedures/Xrays and details which were not copied into this note but were reviewed prior to creation of Plan.      LABS:  I reviewed today's most current labs and imaging studies.  Pertinent labs include:  Recent Labs     11/15/21  0554 11/16/21  0639 11/17/21  0437   WBC 37.0* 26.1* 20.3*   HGB 11.9 9.8* 11.1*   HCT 37.2 29.7* 34.0*   PLT 421* 351 415*       Recent Labs     11/14/21  2015 11/15/21  0554 11/16/21  0639 11/17/21  0437   NA 137 141 137 135*   K 3.1* 3.3* 2.7* 4.7   CL 104 110* 104 106  CO2 25 24 25 26    BUN 32* 18 9 6    MG 2.1  --   --   --    PHOS  --  2.1*  --   --    ALT 19  --  12 14         Signed: , PA-C

## 2021-11-18 LAB — CBC WITH AUTO DIFFERENTIAL
Absolute Immature Granulocyte: 0 10*3/uL
Band Neutrophils: 3 % (ref 0–6)
Basophils %: 1 % (ref 0–1)
Basophils Absolute: 0.1 10*3/uL (ref 0.0–0.1)
Eosinophils %: 4 % (ref 0–7)
Eosinophils Absolute: 0.5 10*3/uL — ABNORMAL HIGH (ref 0.0–0.4)
Hematocrit: 37.1 % (ref 35.0–47.0)
Hemoglobin: 11.9 g/dL (ref 11.5–16.0)
Immature Granulocytes: 0 %
Lymphocytes %: 22 % (ref 12–49)
Lymphocytes Absolute: 2.8 10*3/uL (ref 0.8–3.5)
MCH: 29.5 PG (ref 26.0–34.0)
MCHC: 32.1 g/dL (ref 30.0–36.5)
MCV: 92.1 FL (ref 80.0–99.0)
MPV: 9 FL (ref 8.9–12.9)
Monocytes %: 6 % (ref 5–13)
Monocytes Absolute: 0.8 10*3/uL (ref 0.0–1.0)
Neutrophils %: 64 % (ref 32–75)
Neutrophils Absolute: 8.3 10*3/uL — ABNORMAL HIGH (ref 1.8–8.0)
Nucleated RBCs: 0 PER 100 WBC
Platelets: 408 10*3/uL — ABNORMAL HIGH (ref 150–400)
RBC: 4.03 M/uL (ref 3.80–5.20)
RDW: 14.7 % — ABNORMAL HIGH (ref 11.5–14.5)
WBC: 12.5 10*3/uL — ABNORMAL HIGH (ref 3.6–11.0)
nRBC: 0 10*3/uL (ref 0.00–0.01)

## 2021-11-18 LAB — COMPREHENSIVE METABOLIC PANEL
ALT: 15 U/L (ref 12–78)
AST: 9 U/L — ABNORMAL LOW (ref 15–37)
Albumin/Globulin Ratio: 0.8 — ABNORMAL LOW (ref 1.1–2.2)
Albumin: 2.5 g/dL — ABNORMAL LOW (ref 3.5–5.0)
Alk Phosphatase: 96 U/L (ref 45–117)
Anion Gap: 5 mmol/L (ref 5–15)
BUN: 6 mg/dL (ref 6–20)
Bun/Cre Ratio: 11 — ABNORMAL LOW (ref 12–20)
CO2: 24 mmol/L (ref 21–32)
Calcium: 8.8 mg/dL (ref 8.5–10.1)
Chloride: 106 mmol/L (ref 97–108)
Creatinine: 0.54 mg/dL — ABNORMAL LOW (ref 0.55–1.02)
Est, Glom Filt Rate: >60 mL/min/{1.73_m2} (ref >60)
Globulin: 3.3 g/dL (ref 2.0–4.0)
Glucose: 86 mg/dL (ref 65–100)
Potassium: 4.3 mmol/L (ref 3.5–5.1)
Sodium: 135 mmol/L — ABNORMAL LOW (ref 136–145)
Total Bilirubin: 0.3 mg/dL (ref 0.2–1.0)
Total Protein: 5.8 g/dL — ABNORMAL LOW (ref 6.4–8.2)

## 2021-11-18 MED ORDER — VANCOMYCIN HCL 125 MG PO CAPS
125 MG | ORAL_CAPSULE | Freq: Four times a day (QID) | ORAL | 0 refills | Status: AC
Start: 2021-11-18 — End: 2021-11-28

## 2021-11-18 MED ORDER — VANCOMYCIN HCL 125 MG PO CAPS
125 | Freq: Four times a day (QID) | ORAL | Status: DC
Start: 2021-11-18 — End: 2021-11-21
  Administered 2021-11-18 – 2021-11-21 (×14): 125 mg via ORAL

## 2021-11-18 MED FILL — VANCOMYCIN HCL 125 MG PO CAPS: 125 MG | ORAL | Qty: 1

## 2021-11-18 NOTE — Plan of Care (Signed)
PHYSICAL THERAPY TREATMENT     Patient: [name] (76 y.o. female)  Date: 11/18/2021  Diagnosis: Dehydration [E86.0]  Hypokalemia [E87.6]  Diarrhea, unspecified type [R19.7] Dehydration      Precautions:  Bed Alarm, Fall Risk, Contact Precautions                Recommendations for nursing mobility: Out of bed to chair for meals, Encourage HEP in prep for ADLs/mobility; see handout for details, Frequent repositioning to prevent skin breakdown, Use of bed/chair alarm for safety, Use of BSC for toileting , AD and gt belt for bed to chair , and Assist x1    In place during session: Peripheral IV and External Catheter  Chart, physical therapy assessment, plan of care and goals were reviewed.  ASSESSMENT  Patient continues with skilled PT services and is progressing towards goals. Pt received semi supine on bed upon PT arrival, agreeable to session. Pt A&O x self, place, disoriented to time. (See below for objective details and assist levels).     Overall pt tolerated session fair today with elevated HR with mobility. Pt requires CGA/minA and additional time for bed mobility, CGA for STS transfers with cues for hand placement and technique x 2 trials, able to ambulate - 6' with Rw, min/modA, COG altered, posterior lean, narrow BOS, decreased step length with step to gait, requires vc's for safety and walker management. Will continue to benefit from skilled PT services, and will continue to progress as tolerated. Potential barriers for safe discharge: pt has poor safety awareness, pt has impaired cognition, pt is a high fall risk, and concern for pt safely navigating or managing the home environment. Current PT DC recommendation Skilled Nursing Facility once medically appropriate.       Start of Session End of Session   SPO2 (%) 98 97   Heart Rate (BPM) 103 128     GOALS:  FUNCTIONAL STATUS PRIOR TO ADMISSION: LTC resident, req A with ADLS and amb    Physical Therapy Goals  Initiated 11/17/2021  Pt stated  goal: to get better    I with LE HEP x7 days  Mod I with bed mob x7 days  SBA with all transfers x7 days  Amb 25-48ft with LRAD and SBAx1 x7 days       PLAN :  Patient continues to benefit from skilled intervention to address functional impairments. Continue treatment per established plan of care to address goals.    Recommendation for discharge: (in order for the patient to meet his/her long term goals)  Skilled Nursing Facility           SUBJECTIVE:   Patient stated " My stomach is hurting"    OBJECTIVE DATA SUMMARY:   Critical Behavior:  Orientation  Orientation Level: Oriented to place;Oriented to person;Disoriented to time  Cognition  Overall Cognitive Status: Exceptions  Following Commands: Follows one step commands consistently  Safety Judgement: Decreased awareness of need for assistance;Decreased awareness of need for safety  Initiation: Requires cues for some  Sequencing: Requires cues for some    Functional Mobility Training:  Bed Mobility:  Bed Mobility Training  Bed Mobility Training: Yes  Overall Level of Assistance: Minimum assistance;Assist X1  Interventions: Verbal cues;Tactile cues  Rolling: Contact-guard assistance  Supine to Sit: Contact-guard assistance;Minimum assistance  Sit to Supine: Contact-guard assistance  Scooting: Stand-by assistance  Transfers:  Art therapist: Yes  Overall Level of Assistance: Minimum assistance;Assist X1;Adaptive equipment;Additional time  Interventions: Verbal cues;Tactile cues;Safety  awareness training;Weight shifting training/pressure relief  Sit to Stand: Contact-guard assistance;Assist X1;Additional time  Stand to Sit: Contact-guard assistance;Assist X1;Additional time  Balance:  Balance  Sitting: Intact  Standing: Impaired  Standing - Static: Fair;Unsupported  Standing - Dynamic: Fair;Constant support  Wheelchair Mobility:     Ambulation/Gait Training:     Gait  Overall Level of Assistance: Minimum assistance;Assist X1;Moderate  assistance  Distance (ft): 6 Feet  Assistive Device: Gait belt;Walker, rolling  Interventions: Verbal cues;Tactile cues;Safety awareness training;Weight shifting training/pressure relief  Base of Support: Narrowed  Speed/Cadence: Shuffled;Slow  Step Length: Left shortened;Right shortened  Stance: Left decreased  Gait Abnormalities: Decreased step clearance;Shuffling gait;Step to gait      Therapeutic Exercises:       EXERCISE   Sets   Reps   Active Active Assist   Passive Self ROM   Comments   Ankle Pumps  15 [x]  []  []  []     Long Arc Quads  10 [x]  []  []  []     Marching  10 [x]  []  []  []        []  []  []  []        Pain Rating:  8/10 Pain at lower abdomen reported  Pain Intervention(s):   nursing notified    Activity Tolerance:   Fair     After treatment patient left in no apparent distress:   Bed locked and returned to lowest position, Patient left in no apparent distress in bed, Call bell within reach, Bed/ chair alarm activated, and Side rails x3, and nsg updated     COMMUNICATION/COLLABORATION:   The patient's plan of care was discussed with: Registered nurse    Patient Education  Education Given To: Patient  Education Provided: Role of Therapy;Plan of Care;Home Exercise Program;Transfer Training;Equipment  Education Method: Verbal;Demonstration  Barriers to Learning: Cognition  Education Outcome: Verbalized understanding;Demonstrated understanding;Continued education needed        Gerri Spore, PT  Minutes: 30

## 2021-11-18 NOTE — Discharge Summary (Signed)
Discharge Summary    Name: [name]  [id number]  Date of Birth: [birth year] (Age: 76 y.o.)   Date of Admission: 11/14/2021  Date of Discharge: 11/18/2021  Attending Physician: Lieutenant Diego, MD    Discharge Diagnosis:   Principal Problem:    Dehydration  Active Problems:    AKI (acute kidney injury) (HCC)    Diarrhea    Hypokalemia    C. difficile colitis  Resolved Problems:    * No resolved hospital problems. *       Consultations:  IP CONSULT TO SPIRITUAL SERVICES  IP WOUND CARE NURSE CONSULT TO EVAL      Brief Admission History/Reason for Admission Per [name], MD:       Brief Hospital Course by Main Problems:   [name] is a 76 year old female with PMH of COPD, HTN and hyperlipidemia that presented to the ER on 10/23 complaining of generalized fatigue and watery diarrhea that has progressively worsened the past couple of days. She was recently admitted with colitis suspected infectious process on 9/26 and discharged on 9/29 to [facility] with Ciprofloxacin and Flagyl. CT abdomen pelvis with IV contrast on 10/23 reveals sigmoid diverticulosis without identified diverticulitis with no identified cause for diarrhea. Chest- xray on 10/23 revealed no acute process. C diff positive.  Patient improved on vancomycin and diarrhea has decreased significantly.  Dehydration has resolved.  Hypokalemia has resolved.  Patient will continue vancomycin for 10 additional days.  Patient is ready for discharge.    Discharge Exam:  Patient seen and examined by me on discharge day.  Pertinent Findings:  Patient Vitals for the past 24 hrs:   BP Temp Temp src Pulse Resp SpO2 Height   11/18/21 0353 134/83 98.2 F (36.8 C) Oral 91 24 96 % --   11/17/21 1938 (!) 148/87 97.3 F (36.3 C) Oral 95 21 98 % --   11/17/21 1524 107/63 98.2 F (36.8 C) Oral 98 16 95 % --   11/17/21 1013 -- -- -- -- -- -- 1.6 m (5' 2.99")       Gen:    Not in distress  Chest: Clear lungs  CVS:    Regular rhythm.  No edema  Abd:  Soft, not distended, not tender    Discharge/Recent Laboratory Results:  Recent Labs     11/18/21  0617   NA 135*   K 4.3   CL 106   CO2 24   BUN 6   CREATININE 0.54*   GLUCOSE 86   CALCIUM 8.8     Recent Labs     11/18/21  0617   HGB 11.9   HCT 37.1   WBC 12.5*   PLT 408*       Discharge Medications:     Medication List        START taking these medications      vancomycin 125 MG capsule  Commonly known as: VANCOCIN  Take 1 capsule by mouth 4 times daily for 10 days            CONTINUE taking these medications      albuterol sulfate HFA 108 (90 Base) MCG/ACT inhaler  Commonly known as: PROVENTIL;VENTOLIN;PROAIR     aspirin 81 MG EC tablet  Take 1 tablet by mouth daily     atorvastatin 40 MG tablet  Commonly known as: LIPITOR     metoprolol tartrate 25 MG tablet  Commonly known as: LOPRESSOR     mirtazapine 30  MG tablet  Commonly known as: REMERON     omeprazole 20 MG delayed release capsule  Commonly known as: PRILOSEC            STOP taking these medications      metoprolol succinate 25 MG extended release tablet  Commonly known as: TOPROL XL     polyethylene glycol 17 GM/SCOOP powder  Commonly known as: GLYCOLAX     Trelegy Ellipta 200-62.5-25 MCG/ACT Aepb inhaler  Generic drug: fluticasone-umeclidin-vilant               Where to Get Your Medications        [id number] [employer] [facility], [place] - [place] [phone]  [place], [place] [address]      Phone: [phone]   vancomycin 125 MG capsule             DISPOSITION:    Home with Family:       Home with HH/PT/OT/RN:    SNF/LTC: x   SAHR:    OTHER:            Code status:   Recommended diet: regular diet  Recommended activity: activity as tolerated  Wound care: None      Follow up with:   PCP : Rigoberto Noel, APRN - NP  Rigoberto Noel, APRN - NP  [place] [address]  [phone]    Follow up  in 1 week(s)            Total time in minutes spent coordinating this discharge (includes going over instructions, follow-up, prescriptions, and preparing report for sign off to her PCP) :  35 minutes

## 2021-11-18 NOTE — Plan of Care (Signed)
Problem: Skin/Tissue Integrity  Goal: Absence of new skin breakdown  Description: 1.  Monitor for areas of redness and/or skin breakdown  2.  Assess vascular access sites hourly  3.  Every 4-6 hours minimum:  Change oxygen saturation probe site  4.  Every 4-6 hours:  If on nasal continuous positive airway pressure, respiratory therapy assess nares and determine need for appliance change or resting period.  Outcome: Progressing     Problem: Discharge Planning  Goal: Discharge to home or other facility with appropriate resources  Outcome: Progressing     Problem: Pain  Goal: Verbalizes/displays adequate comfort level or baseline comfort level  Outcome: Progressing     Problem: Safety - Adult  Goal: Free from fall injury  Outcome: Progressing     Problem: ABCDS Injury Assessment  Goal: Absence of physical injury  Outcome: Progressing

## 2021-11-18 NOTE — Care Coordination-Inpatient (Addendum)
CM reviewed Pt medicals, [place] has received all the medicals, they now need to get auth.     3:50  As of this time [facility] has not gotten [place].  CM please call Phylliss Blakes @ [phone] and she will assist with getting Pt transferred over to [facility].

## 2021-11-18 NOTE — Plan of Care (Signed)
OCCUPATIONAL THERAPY TREATMENT  Patient: [name] (76 y.o. female)  Date: 11/18/2021  Primary Diagnosis: Dehydration [E86.0]  Hypokalemia [E87.6]  Diarrhea, unspecified type [R19.7]       Precautions: Bed Alarm, Fall Risk, Contact Precautions                Recommendations for nursing mobility: Encourage HEP in prep for ADLs/mobility; see handout for details, Frequent repositioning to prevent skin breakdown, Use of bed/chair alarm for safety, and Assist x1    In place during session: Peripheral IV, External Catheter, and EKG/telemetry   Chart, occupational therapy assessment, plan of care, and goals were reviewed.  ASSESSMENT  Patient continues with skilled OT services and is progressing towards goals. Pt semi supine upon OT arrival, agreeable to session. Pt A&O x person. Pt able to complete bed mobility and OOB functional mobility with CGA-min A and additional time overall. Pt req's VC for safety to push from/reach back for bed during transfers and to keep RW close while taking side steps at EOB. UE therex completed while seated EOB (see below for details). Pt's HR elevated to 140s while taking side steps so pt returned supine in bed. Pt then combed hair with set up A to hand pt comb. (See below for objective details and assist levels).     Overall pt tolerated session fair today with c/o dizziness with with OOB mobility. Potential barriers for safe discharge: pt has poor safety awareness, pt has impaired cognition, pt is a high fall risk, pt is not safe to be alone, and concern for pt safely navigating or managing the home environment. Current OT recommendations for discharge [place]. Will continue to benefit from skilled OT services, and will continue to progress as tolerated.     GOALS:    Problem: Occupational Therapy - Adult  Goal: By Discharge: Performs self-care activities at highest level of function for planned discharge setting.  See evaluation for individualized goals.  Description:  FUNCTIONAL STATUS PRIOR TO ADMISSION:  Pt was using RW and req'd assistance for functional mobility. Pt was req'ing assistance for ADLs.    HOME SUPPORT: The pt was a LTC resident and req'd assistance with ADLs.    Occupational Therapy Goals:  Initiated 11/17/2021  Patient/Family stated goal: Walk to the bathroom  1.  Patient will perform grooming with Stand by Assist within 7 day(s).  2.  Patient will perform bathing with Contact Guard Assist within 7 day(s).  3.  Patient will perform lower body dressing with Contact Guard Assist within 7 day(s).  4.  Patient will perform toilet transfers with Contact Guard Assist  within 7 day(s).  5.  Patient will perform all aspects of toileting with Contact Guard Assist within 7 day(s).  6.  Patient will participate in upper extremity therapeutic exercise/activities with [place] within 7 day(s).    7.  Patient will utilize energy conservation techniques during functional activities with verbal cues within 7 day(s).  Outcome: Progressing        PLAN :  Patient continues to benefit from skilled intervention to address functional impairments. Continue treatment per established plan of care to address goals.    Recommend next OT session: Toileting    Recommendation for discharge: (in order for the patient to meet his/her long term goals): [place]    IF patient discharges home will need the following DME: patient owns DME required for discharge       SUBJECTIVE:   Patient stated "I can't do  much with my hair."    OBJECTIVE DATA SUMMARY:   Cognitive/Behavioral Status:  Orientation  Orientation Level: Oriented to person  Cognition  Overall Cognitive Status: Exceptions  Arousal/Alertness: Appropriate responses to stimuli  Following Commands: Follows one step commands consistently  Attention Span: Appears intact  Memory: Appears intact  Safety Judgement: Decreased awareness of need for assistance;Decreased awareness of need for safety  Problem Solving: Decreased  awareness of errors  Initiation: Requires cues for some  Sequencing: Requires cues for some    Functional Mobility and Transfers for ADLs:  Bed Mobility:  Bed Mobility Training  Bed Mobility Training: Yes  Overall Level of Assistance: Minimum assistance;Assist X1  Interventions: Verbal cues;Tactile cues  Rolling: Contact-guard assistance  Supine to Sit: Contact-guard assistance;Minimum assistance  Sit to Supine: Contact-guard assistance;Minimum assistance  Scooting: Stand-by assistance    Transfers:  Art therapist: Yes  Overall Level of Assistance: Minimum assistance;Assist X1;Adaptive equipment;Additional time  Interventions: Verbal cues;Safety awareness training  Sit to Stand: Contact-guard assistance;Additional time  Stand to Sit: Contact-guard assistance;Additional time      Balance:  Balance  Sitting: Intact  Standing: Impaired  Standing - Static: Fair;Unsupported  Standing - Dynamic: Fair;Constant support      ADL Intervention:  Grooming: Setup   Grooming Skilled Clinical Factors: Handing pt comb while semi supine        Therapeutic exercise:    Exercise Sets Reps AROM AAROM PROM Self PROM Comments   Shoulder flex/ext  1  10 [x]  []  []  []     Elbow flex/ext  1  10 [x]  []  []  []     Wrist flex/ext  1  10 []  []  []  []        []  []  []  []       Pain Rating:  0/10   Pain Intervention(s):       Activity Tolerance:   Fair  and requires rest breaks    After treatment patient left in no apparent distress:   Bed locked and returned to lowest position, Patient left in no apparent distress in bed, Call bell within reach, Bed/ chair alarm activated, and Side rails x3, and nsg updated     COMMUNICATION/EDUCATION:   The patient's plan of care was discussed with: Registered nurse    Patient Education  Education Given To: Patient  Education Provided: Role of Therapy;ADL Adaptive Strategies;Energy Conservation;Fall Prevention Method: Verbal  Barriers to Learning:  Cognition  Education Outcome: Verbalized understanding;Continued education needed    Thank you for this referral.  , OT  Minutes: 26

## 2021-11-19 LAB — COMPREHENSIVE METABOLIC PANEL
ALT: 13 U/L (ref 12–78)
AST: 10 U/L — ABNORMAL LOW (ref 15–37)
Albumin/Globulin Ratio: 0.8 — ABNORMAL LOW (ref 1.1–2.2)
Albumin: 2.5 g/dL — ABNORMAL LOW (ref 3.5–5.0)
Alk Phosphatase: 86 U/L (ref 45–117)
Anion Gap: 4 mmol/L — ABNORMAL LOW (ref 5–15)
BUN: 10 mg/dL (ref 6–20)
Bun/Cre Ratio: 14 (ref 12–20)
CO2: 27 mmol/L (ref 21–32)
Calcium: 8.9 mg/dL (ref 8.5–10.1)
Chloride: 105 mmol/L (ref 97–108)
Creatinine: 0.7 mg/dL (ref 0.55–1.02)
Est, Glom Filt Rate: >60 mL/min/{1.73_m2} (ref >60)
Globulin: 3.3 g/dL (ref 2.0–4.0)
Glucose: 86 mg/dL (ref 65–100)
Potassium: 4.3 mmol/L (ref 3.5–5.1)
Sodium: 136 mmol/L (ref 136–145)
Total Bilirubin: 0.2 mg/dL (ref 0.2–1.0)
Total Protein: 5.8 g/dL — ABNORMAL LOW (ref 6.4–8.2)

## 2021-11-19 LAB — CBC WITH AUTO DIFFERENTIAL
Absolute Immature Granulocyte: 0 10*3/uL
Band Neutrophils: 2 % (ref 0–6)
Basophils %: 2 % — ABNORMAL HIGH (ref 0–1)
Basophils Absolute: 0.3 10*3/uL — ABNORMAL HIGH (ref 0.0–0.1)
Eosinophils %: 0 % (ref 0–7)
Eosinophils Absolute: 0 10*3/uL (ref 0.0–0.4)
Hematocrit: 36.8 % (ref 35.0–47.0)
Hemoglobin: 12 g/dL (ref 11.5–16.0)
Immature Granulocytes: 0 %
Lymphocytes %: 35 % (ref 12–49)
Lymphocytes Absolute: 4.5 10*3/uL — ABNORMAL HIGH (ref 0.8–3.5)
MCH: 29.6 PG (ref 26.0–34.0)
MCHC: 32.6 g/dL (ref 30.0–36.5)
MCV: 90.6 FL (ref 80.0–99.0)
MPV: 8.9 FL (ref 8.9–12.9)
Monocytes %: 9 % (ref 5–13)
Monocytes Absolute: 1.2 10*3/uL — ABNORMAL HIGH (ref 0.0–1.0)
Neutrophils %: 52 % (ref 32–75)
Neutrophils Absolute: 6.8 10*3/uL (ref 1.8–8.0)
Nucleated RBCs: 0 PER 100 WBC
Platelets: 457 10*3/uL — ABNORMAL HIGH (ref 150–400)
RBC: 4.06 M/uL (ref 3.80–5.20)
RDW: 14.9 % — ABNORMAL HIGH (ref 11.5–14.5)
WBC: 12.8 10*3/uL — ABNORMAL HIGH (ref 3.6–11.0)
nRBC: 0 10*3/uL (ref 0.00–0.01)

## 2021-11-19 MED FILL — VANCOMYCIN HCL 125 MG PO CAPS: 125 MG | ORAL | Qty: 1

## 2021-11-19 NOTE — Care Coordination-Inpatient (Signed)
CM noted discharge order. Patient's discharge plan is SNF [facility]). Discharge is pending insurance authorization.

## 2021-11-19 NOTE — Plan of Care (Signed)
Problem: Skin/Tissue Integrity  Goal: Absence of new skin breakdown  Description: 1.  Monitor for areas of redness and/or skin breakdown  2.  Assess vascular access sites hourly  3.  Every 4-6 hours minimum:  Change oxygen saturation probe site  4.  Every 4-6 hours:  If on nasal continuous positive airway pressure, respiratory therapy assess nares and determine need for appliance change or resting period.  Outcome: Progressing     Problem: Discharge Planning  Goal: Discharge to home or other facility with appropriate resources  Outcome: Progressing  Flowsheets (Taken 11/19/2021 0737)  Discharge to home or other facility with appropriate resources: Identify barriers to discharge with patient and caregiver     Problem: Pain  Goal: Verbalizes/displays adequate comfort level or baseline comfort level  Outcome: Progressing     Problem: Safety - Adult  Goal: Free from fall injury  Outcome: Progressing  Flowsheets (Taken 11/19/2021 0747)  Free From Fall Injury: Instruct family/caregiver on patient safety     Problem: ABCDS Injury Assessment  Goal: Absence of physical injury  Outcome: Progressing  Flowsheets (Taken 11/19/2021 0747)  Absence of Physical Injury: Implement safety measures based on patient assessment

## 2021-11-19 NOTE — Discharge Summary (Signed)
Discharge Summary    Name: [name]  [id number]  Date of Birth: 04-Sep-1945 (Age: 76 y.o.)   Date of Admission: 11/14/2021  Date of Discharge: 11/19/2021  Attending Physician: Lieutenant Diego, MD    Discharge Diagnosis:   Principal Problem:    Dehydration  Active Problems:    AKI (acute kidney injury) (HCC)    Diarrhea    Hypokalemia    C. difficile colitis  Resolved Problems:    * No resolved hospital problems. *       Consultations:  IP CONSULT TO SPIRITUAL SERVICES  IP WOUND CARE NURSE CONSULT TO EVAL      Brief Admission History/Reason for Admission Per [name], MD:       Brief Hospital Course by Main Problems:   [name] is a 76 year old female with PMH of COPD, HTN and hyperlipidemia that presented to the ER on 10/23 complaining of generalized fatigue and watery diarrhea that has progressively worsened the past couple of days. She was recently admitted with colitis suspected infectious process on 9/26 and discharged on 9/29 to [facility] with Ciprofloxacin and Flagyl. CT abdomen pelvis with IV contrast on 10/23 reveals sigmoid diverticulosis without identified diverticulitis with no identified cause for diarrhea. Chest- xray on 10/23 revealed no acute process. C diff positive.  Patient improved on vancomycin and diarrhea has decreased significantly.  Dehydration has resolved.  Hypokalemia has resolved.  Patient will continue vancomycin for 10 additional days.  Patient is ready for discharge.    Discharge Exam:  Patient seen and examined by me on discharge day.  Pertinent Findings:  Patient Vitals for the past 24 hrs:   BP Temp Temp src Pulse Resp SpO2   11/19/21 0737 116/86 97.7 F (36.5 C) -- 89 18 94 %   11/19/21 0518 136/74 97.9 F (36.6 C) -- 81 16 95 %   11/18/21 2007 128/83 98.4 F (36.9 C) Oral 98 16 96 %   11/18/21 1401 96/66 97.5 F (36.4 C) Oral 85 15 98 %   11/18/21 1001 -- -- -- -- 15 --   11/18/21 0937 133/89 97.3 F (36.3 C)  Axillary (!) 106 16 94 %   11/18/21 0931 -- -- -- -- 16 --         Gen:    Not in distress  Chest: Clear lungs  CVS:   Regular rhythm.  No edema  Abd:  Soft, not distended, not tender    Discharge/Recent Laboratory Results:  Recent Labs     11/19/21  0433   NA 136   K 4.3   CL 105   CO2 27   BUN 10   CREATININE 0.70   GLUCOSE 86   CALCIUM 8.9       Recent Labs     11/19/21  0433   HGB 12.0   HCT 36.8   WBC 12.8*   PLT 457*         Discharge Medications:     Medication List        START taking these medications      vancomycin 125 MG capsule  Commonly known as: VANCOCIN  Take 1 capsule by mouth 4 times daily for 10 days            CONTINUE taking these medications      albuterol sulfate HFA 108 (90 Base) MCG/ACT inhaler  Commonly known as: PROVENTIL;VENTOLIN;PROAIR     aspirin 81 MG EC tablet  Take 1 tablet  by mouth daily     atorvastatin 40 MG tablet  Commonly known as: LIPITOR     metoprolol tartrate 25 MG tablet  Commonly known as: LOPRESSOR     mirtazapine 30 MG tablet  Commonly known as: REMERON     omeprazole 20 MG delayed release capsule  Commonly known as: PRILOSEC            STOP taking these medications      metoprolol succinate 25 MG extended release tablet  Commonly known as: TOPROL XL     polyethylene glycol 17 GM/SCOOP powder  Commonly known as: GLYCOLAX     Trelegy Ellipta 200-62.5-25 MCG/ACT Aepb inhaler  Generic drug: fluticasone-umeclidin-vilant               Where to Get Your Medications        [id number] [employer], [place] - [place] [phone]  [place], [place] [address]      Phone: [phone]   vancomycin 125 MG capsule             DISPOSITION:    Home with Family:       Home with HH/PT/OT/RN:    SNF/LTC: x   SAHR:    OTHER:            Code status:   Recommended diet: regular diet  Recommended activity: activity as tolerated  Wound care: None      Follow up with:   PCP : Rigoberto Noel, APRN - NP  Rigoberto Noel, APRN - NP  [place] [address]  [phone]    Follow up in 1 week(s)            Total time in minutes spent coordinating this discharge (includes going over instructions, follow-up, prescriptions, and preparing report for sign off to her PCP) :  35 minutes

## 2021-11-20 LAB — CULTURE, BLOOD 2: Culture: NO GROWTH

## 2021-11-20 LAB — CBC WITH AUTO DIFFERENTIAL
Absolute Immature Granulocyte: 0 10*3/uL
Basophils %: 1 % (ref 0–1)
Basophils Absolute: 0.1 10*3/uL (ref 0.0–0.1)
Eosinophils %: 0 % (ref 0–7)
Eosinophils Absolute: 0 10*3/uL (ref 0.0–0.4)
Hematocrit: 34.3 % — ABNORMAL LOW (ref 35.0–47.0)
Hemoglobin: 11.3 g/dL — ABNORMAL LOW (ref 11.5–16.0)
Immature Granulocytes: 0 %
Lymphocytes %: 29 % (ref 12–49)
Lymphocytes Absolute: 3.7 10*3/uL — ABNORMAL HIGH (ref 0.8–3.5)
MCH: 29.4 PG (ref 26.0–34.0)
MCHC: 32.9 g/dL (ref 30.0–36.5)
MCV: 89.3 FL (ref 80.0–99.0)
MPV: 8.7 FL — ABNORMAL LOW (ref 8.9–12.9)
Monocytes %: 11 % (ref 5–13)
Monocytes Absolute: 1.4 10*3/uL — ABNORMAL HIGH (ref 0.0–1.0)
Myelocytes: 1 % — ABNORMAL HIGH
Neutrophils %: 58 % (ref 32–75)
Neutrophils Absolute: 7.4 10*3/uL (ref 1.8–8.0)
Nucleated RBCs: 0 PER 100 WBC
Platelets: 464 10*3/uL — ABNORMAL HIGH (ref 150–400)
RBC: 3.84 M/uL (ref 3.80–5.20)
RDW: 15 % — ABNORMAL HIGH (ref 11.5–14.5)
WBC: 12.7 10*3/uL — ABNORMAL HIGH (ref 3.6–11.0)
nRBC: 0 10*3/uL (ref 0.00–0.01)

## 2021-11-20 LAB — COMPREHENSIVE METABOLIC PANEL
ALT: 14 U/L (ref 12–78)
AST: 9 U/L — ABNORMAL LOW (ref 15–37)
Albumin/Globulin Ratio: 0.8 — ABNORMAL LOW (ref 1.1–2.2)
Albumin: 2.6 g/dL — ABNORMAL LOW (ref 3.5–5.0)
Alk Phosphatase: 80 U/L (ref 45–117)
Anion Gap: 4 mmol/L — ABNORMAL LOW (ref 5–15)
BUN: 8 mg/dL (ref 6–20)
Bun/Cre Ratio: 13 (ref 12–20)
CO2: 28 mmol/L (ref 21–32)
Calcium: 8.6 mg/dL (ref 8.5–10.1)
Chloride: 106 mmol/L (ref 97–108)
Creatinine: 0.63 mg/dL (ref 0.55–1.02)
Est, Glom Filt Rate: >60 mL/min/{1.73_m2} (ref >60)
Globulin: 3.1 g/dL (ref 2.0–4.0)
Glucose: 87 mg/dL (ref 65–100)
Potassium: 4 mmol/L (ref 3.5–5.1)
Sodium: 138 mmol/L (ref 136–145)
Total Bilirubin: 0.3 mg/dL (ref 0.2–1.0)
Total Protein: 5.7 g/dL — ABNORMAL LOW (ref 6.4–8.2)

## 2021-11-20 LAB — CULTURE, BLOOD 1: Culture: NO GROWTH

## 2021-11-20 LAB — POCT GLUCOSE: POC Glucose: 106 mg/dL — ABNORMAL HIGH (ref 65–100)

## 2021-11-20 MED ORDER — DIPHENHYDRAMINE HCL 25 MG PO CAPS
25 | Freq: Four times a day (QID) | ORAL | Status: DC | PRN
Start: 2021-11-20 — End: 2021-11-21
  Administered 2021-11-20 – 2021-11-21 (×2): 25 mg via ORAL

## 2021-11-20 MED FILL — VANCOMYCIN HCL 125 MG PO CAPS: 125 MG | ORAL | Qty: 1

## 2021-11-20 MED FILL — DIPHENHYDRAMINE HCL 25 MG PO CAPS: 25 MG | ORAL | Qty: 1

## 2021-11-20 NOTE — Plan of Care (Signed)
Problem: Skin/Tissue Integrity  Goal: Absence of new skin breakdown  Description: 1.  Monitor for areas of redness and/or skin breakdown  2.  Assess vascular access sites hourly  3.  Every 4-6 hours minimum:  Change oxygen saturation probe site  4.  Every 4-6 hours:  If on nasal continuous positive airway pressure, respiratory therapy assess nares and determine need for appliance change or resting period.  Outcome: Progressing     Problem: Discharge Planning  Goal: Discharge to home or other facility with appropriate resources  Outcome: Progressing  Flowsheets  Taken 11/20/2021 0738 by Katha Cabal, RN  Discharge to home or other facility with appropriate resources: Identify barriers to discharge with patient and caregiver  Taken 11/19/2021 2010 by Loma Sender, RN  Discharge to home or other facility with appropriate resources: Arrange for needed discharge resources and transportation as appropriate     Problem: Pain  Goal: Verbalizes/displays adequate comfort level or baseline comfort level  Outcome: Progressing     Problem: Safety - Adult  Goal: Free from fall injury  Outcome: Progressing  Flowsheets (Taken 11/20/2021 0933)  Free From Fall Injury: Instruct family/caregiver on patient safety     Problem: ABCDS Injury Assessment  Goal: Absence of physical injury  Outcome: Progressing  Flowsheets (Taken 11/20/2021 0933)  Absence of Physical Injury: Implement safety measures based on patient assessment

## 2021-11-20 NOTE — Plan of Care (Signed)
Problem: Skin/Tissue Integrity  Goal: Absence of new skin breakdown  Description: 1.  Monitor for areas of redness and/or skin breakdown  2.  Assess vascular access sites hourly  3.  Every 4-6 hours minimum:  Change oxygen saturation probe site  4.  Every 4-6 hours:  If on nasal continuous positive airway pressure, respiratory therapy assess nares and determine need for appliance change or resting period.  11/20/2021 2247 by Marcha Dutton, RN  Outcome: Progressing  11/20/2021 0936 by Katha Cabal, RN  Outcome: Progressing     Problem: Discharge Planning  Goal: Discharge to home or other facility with appropriate resources  11/20/2021 2247 by Marcha Dutton, RN  Outcome: Progressing  11/20/2021 0936 by Katha Cabal, RN  Outcome: Progressing  Flowsheets  Taken 11/20/2021 0738 by Katha Cabal, RN  Discharge to home or other facility with appropriate resources: Identify barriers to discharge with patient and caregiver  Taken 11/19/2021 2010 by Loma Sender, RN  Discharge to home or other facility with appropriate resources: Arrange for needed discharge resources and transportation as appropriate     Problem: Pain  Goal: Verbalizes/displays adequate comfort level or baseline comfort level  11/20/2021 0936 by Katha Cabal, RN  Outcome: Progressing     Problem: Safety - Adult  Goal: Free from fall injury  11/20/2021 2247 by Marcha Dutton, RN  Outcome: Progressing  11/20/2021 0936 by Katha Cabal, RN  Outcome: Progressing  Flowsheets (Taken 11/20/2021 0933)  Free From Fall Injury: Instruct family/caregiver on patient safety     Problem: ABCDS Injury Assessment  Goal: Absence of physical injury  11/20/2021 0936 by Katha Cabal, RN  Outcome: Progressing  Flowsheets (Taken 11/20/2021 0933)  Absence of Physical Injury: Implement safety measures based on patient assessment

## 2021-11-20 NOTE — Discharge Summary (Cosign Needed)
Discharge Summary    Name: [name]  [id number]  Date of Birth: Dec 15, 1945 (Age: 76 y.o.)   Date of Admission: 11/14/2021  Date of Discharge: 11/20/2021  Attending Physician: Lieutenant Diego, MD    Discharge Diagnosis:   Principal Problem:    Dehydration  Active Problems:    AKI (acute kidney injury) (HCC)    Diarrhea    Hypokalemia    C. difficile colitis  Resolved Problems:    * No resolved hospital problems. *       Consultations:  IP CONSULT TO SPIRITUAL SERVICES  IP WOUND CARE NURSE CONSULT TO EVAL      Brief Admission History/Reason for Admission Per [name], MD:       Brief Hospital Course by Main Problems:   [name] is a 76 year old female with PMH of COPD, HTN and hyperlipidemia that presented to the ER on 10/23 complaining of generalized fatigue and watery diarrhea that has progressively worsened the past couple of days. She was recently admitted with colitis suspected infectious process on 9/26 and discharged on 9/29 to [facility] with Ciprofloxacin and Flagyl. CT abdomen pelvis with IV contrast on 10/23 reveals sigmoid diverticulosis without identified diverticulitis with no identified cause for diarrhea. Chest- xray on 10/23 revealed no acute process. C diff positive.  Patient improved on vancomycin and diarrhea has decreased significantly.  Dehydration has resolved.  Hypokalemia has resolved.  Patient will continue vancomycin for 10 additional days.  Patient is ready for discharge.    Discharge Exam:  Patient seen and examined by me on discharge day.  Pertinent Findings:  Patient Vitals for the past 24 hrs:   BP Temp Temp src Pulse Resp SpO2   11/20/21 0738 135/85 97.9 F (36.6 C) Oral 87 19 96 %   11/20/21 0506 130/85 97.5 F (36.4 C) -- 90 16 94 %   11/19/21 2134 114/74 -- -- -- -- --   11/19/21 2131 130/81 97.5 F (36.4 C) Oral 92 20 94 %   11/19/21 2010 94/65 97.5 F (36.4 C) Oral 97 16 95 %   11/19/21 1638 117/88 97.7 F  (36.5 C) Oral 86 18 97 %       Gen:    Not in distress  Chest: Clear lungs  CVS:   Regular rhythm.  No edema  Abd:  Soft, not distended, not tender    Discharge/Recent Laboratory Results:  Recent Labs     11/20/21  0450   NA 138   K 4.0   CL 106   CO2 28   BUN 8   CREATININE 0.63   GLUCOSE 87   CALCIUM 8.6     Recent Labs     11/20/21  0450   HGB 11.3*   HCT 34.3*   WBC 12.7*   PLT 464*       Discharge Medications:     Medication List        START taking these medications      vancomycin 125 MG capsule  Commonly known as: VANCOCIN  Take 1 capsule by mouth 4 times daily for 10 days            CONTINUE taking these medications      albuterol sulfate HFA 108 (90 Base) MCG/ACT inhaler  Commonly known as: PROVENTIL;VENTOLIN;PROAIR     aspirin 81 MG EC tablet  Take 1 tablet by mouth daily     atorvastatin 40 MG tablet  Commonly known as: LIPITOR  metoprolol tartrate 25 MG tablet  Commonly known as: LOPRESSOR     mirtazapine 30 MG tablet  Commonly known as: REMERON     omeprazole 20 MG delayed release capsule  Commonly known as: PRILOSEC            STOP taking these medications      metoprolol succinate 25 MG extended release tablet  Commonly known as: TOPROL XL     polyethylene glycol 17 GM/SCOOP powder  Commonly known as: GLYCOLAX     Trelegy Ellipta 200-62.5-25 MCG/ACT Aepb inhaler  Generic drug: fluticasone-umeclidin-vilant               Where to Get Your Medications        These [id number] [place], [place] - [address] - P [phone] - F [phone]  [address] [place] [address]      Phone: [phone]   vancomycin 125 MG capsule             DISPOSITION:    Home with Family:       Home with HH/PT/OT/RN:    SNF/LTC: x   SAHR:    OTHER:            Code status:   Recommended diet: regular diet  Recommended activity: activity as tolerated  Wound care: None      Follow up with:   PCP : Vevelyn Francois, APRN - NP  Vevelyn Francois, APRN -  NP  [address]  [address]  [place] [address]  [phone]    Follow up in 1 week(s)            Total time in minutes spent coordinating this discharge (includes going over instructions, follow-up, prescriptions, and preparing report for sign off to her PCP) :  35 minutes

## 2021-11-20 NOTE — Case Communication (Addendum)
DC order noted:  CM contacted facility to inquired about auth.  Awaiting a response.    Received a response. No auth as of yet.  Possibly tomorrow.

## 2021-11-21 LAB — COMPREHENSIVE METABOLIC PANEL
ALT: 13 U/L (ref 12–78)
AST: 8 U/L — ABNORMAL LOW (ref 15–37)
Albumin/Globulin Ratio: 0.8 — ABNORMAL LOW (ref 1.1–2.2)
Albumin: 2.6 g/dL — ABNORMAL LOW (ref 3.5–5.0)
Alk Phosphatase: 78 U/L (ref 45–117)
Anion Gap: 5 mmol/L (ref 5–15)
BUN: 12 mg/dL (ref 6–20)
Bun/Cre Ratio: 18 (ref 12–20)
CO2: 27 mmol/L (ref 21–32)
Calcium: 8.7 mg/dL (ref 8.5–10.1)
Chloride: 106 mmol/L (ref 97–108)
Creatinine: 0.67 mg/dL (ref 0.55–1.02)
Est, Glom Filt Rate: >60 mL/min/{1.73_m2} (ref >60)
Globulin: 3.1 g/dL (ref 2.0–4.0)
Glucose: 85 mg/dL (ref 65–100)
Potassium: 4.1 mmol/L (ref 3.5–5.1)
Sodium: 138 mmol/L (ref 136–145)
Total Bilirubin: 0.2 mg/dL (ref 0.2–1.0)
Total Protein: 5.7 g/dL — ABNORMAL LOW (ref 6.4–8.2)

## 2021-11-21 LAB — CBC WITH AUTO DIFFERENTIAL
Absolute Immature Granulocyte: 0.3 10*3/uL — ABNORMAL HIGH (ref 0.00–0.04)
Basophils %: 0 % (ref 0–1)
Basophils Absolute: 0 10*3/uL (ref 0.0–0.1)
Eosinophils %: 4 % (ref 0–7)
Eosinophils Absolute: 0.6 10*3/uL — ABNORMAL HIGH (ref 0.0–0.4)
Hematocrit: 34.4 % — ABNORMAL LOW (ref 35.0–47.0)
Hemoglobin: 11 g/dL — ABNORMAL LOW (ref 11.5–16.0)
Immature Granulocytes: 2 % — ABNORMAL HIGH (ref 0–0.5)
Lymphocytes %: 22 % (ref 12–49)
Lymphocytes Absolute: 3.1 10*3/uL (ref 0.8–3.5)
MCH: 29 PG (ref 26.0–34.0)
MCHC: 32 g/dL (ref 30.0–36.5)
MCV: 90.8 FL (ref 80.0–99.0)
MPV: 8.5 FL — ABNORMAL LOW (ref 8.9–12.9)
Monocytes %: 6 % (ref 5–13)
Monocytes Absolute: 0.9 10*3/uL (ref 0.0–1.0)
Neutrophils %: 66 % (ref 32–75)
Neutrophils Absolute: 9.4 10*3/uL — ABNORMAL HIGH (ref 1.8–8.0)
Nucleated RBCs: 0 PER 100 WBC
Platelets: 505 10*3/uL — ABNORMAL HIGH (ref 150–400)
RBC: 3.79 M/uL — ABNORMAL LOW (ref 3.80–5.20)
RDW: 14.9 % — ABNORMAL HIGH (ref 11.5–14.5)
WBC: 14.3 10*3/uL — ABNORMAL HIGH (ref 3.6–11.0)
nRBC: 0 10*3/uL (ref 0.00–0.01)

## 2021-11-21 MED FILL — VANCOMYCIN HCL 125 MG PO CAPS: 125 MG | ORAL | Qty: 1

## 2021-11-21 NOTE — Progress Notes (Signed)
Attempted to call report to [facility]. Did not receive an answer.

## 2021-11-21 NOTE — Care Coordination-Inpatient (Signed)
CM reviewed Pt medicals, [facility] has gotten auth, Pt will D/C to day to [facility].    Met f/f with Pt, she is glad to be going back to [facility], she asked CM to call her Girl Friend and let her know.    CM called Barnett Applebaum. She is ok with Pt going to [facility].    [facility]  [place]    Report: [phone]  Room [place] [facility]    Pt will need to go by stretcher, Pt may not qualify for stretcher because she walked 6 ft.    CM called Barnett Applebaum she stated that she will have her husband pick her up. Barnett Applebaum stated that her husband will be here at 2:00 pm.      Transition of Care Plan:    RUR: 20%  Prior Level of Functioning: Dependent  Disposition: [facility]  If SNF or IPR: Date FOC offered: Oct 24  Date FOC received: Oct 24  Accepting facility: [facility]  Date authorization started with reference number: Oct 27  Date authorization received and expires: Oct 30     Transportation at discharge: Family  IM/IMM Medicare/Tricare letter given: Yes    Caregiver Contact: Yes  Discharge Caregiver contacted prior to discharge? Yes  Care Conference needed? No  Barriers to discharge: None

## 2021-11-21 NOTE — Discharge Summary (Cosign Needed)
Discharge Summary    Name: [name]  [id number]  Date of Birth: October 01, 1945 (Age: 76 y.o.)   Date of Admission: 11/14/2021  Date of Discharge: 11/21/2021  Attending Physician: Effie Shy, MD    Discharge Diagnosis:   Principal Problem:    Dehydration  Active Problems:    AKI (acute kidney injury) (HCC)    Diarrhea    Hypokalemia    C. difficile colitis  Resolved Problems:    * No resolved hospital problems. *       Consultations:  IP CONSULT TO SPIRITUAL SERVICES  IP WOUND CARE NURSE CONSULT TO EVAL      Brief Admission History/Reason for Admission Per [name], MD:   Generalized fatigue, watery diarrhea    Brief Hospital Course by Main Problems:   [name] is a 76 year old female with PMH of COPD, HTN and hyperlipidemia that presented to the ER on 10/23 complaining of generalized fatigue and watery diarrhea that has progressively worsened the past couple of days. She was recently admitted with colitis suspected infectious process on 09/26 and discharged on 09/29 to [facility] with Ciprofloxacin and Flagyl. CT abdomen pelvis with IV contrast on 10/23 reveals sigmoid diverticulosis without identified diverticulitis with no identified cause for diarrhea. Chest- xray on 10/23 revealed no acute process. C diff positive.  Patient improved on vancomycin and diarrhea has decreased significantly.  Dehydration has resolved.  Hypokalemia has resolved.  Patient will continue vancomycin for 10 additional days.  Patient is ready for discharge. Awaiting authorization at SNF.    Discharge Exam:  Patient seen and examined by me on discharge day.  Pertinent Findings:  Patient Vitals for the past 24 hrs:   BP Temp Temp src Pulse Resp SpO2   11/21/21 0730 124/76 97.7 F (36.5 C) Oral 88 16 96 %   11/21/21 0239 110/62 97.9 F (36.6 C) Oral 90 18 97 %   11/20/21 2013 102/68 97.5 F (36.4 C) Oral 94 18 97 %   11/20/21 1542 115/81 97.3 F (36.3 C) -- 82 -- 97 %          Gen:    Not in distress  Chest: Clear lungs. On RA.  CVS:   Regular rate and rhythm. +S1/S2. No murmur or gallop. No edema  Abd:  Soft, not distended, not tender  Skin: No rashes or lesions.    Discharge/Recent Laboratory Results:  Recent Labs     11/21/21  0511   NA 138   K 4.1   CL 106   CO2 27   BUN 12   CREATININE 0.67   GLUCOSE 85   CALCIUM 8.7       Recent Labs     11/21/21  0511   HGB 11.0*   HCT 34.4*   WBC 14.3*   PLT 505*         Discharge Medications:     Medication List        START taking these medications      vancomycin 125 MG capsule  Commonly known as: VANCOCIN  Take 1 capsule by mouth 4 times daily for 10 days            CONTINUE taking these medications      albuterol sulfate HFA 108 (90 Base) MCG/ACT inhaler  Commonly known as: PROVENTIL;VENTOLIN;PROAIR     aspirin 81 MG EC tablet  Take 1 tablet by mouth daily     atorvastatin 40 MG tablet  Commonly known as:  LIPITOR     metoprolol tartrate 25 MG tablet  Commonly known as: LOPRESSOR     mirtazapine 30 MG tablet  Commonly known as: REMERON     omeprazole 20 MG delayed release capsule  Commonly known as: PRILOSEC            STOP taking these medications      metoprolol succinate 25 MG extended release tablet  Commonly known as: TOPROL XL     polyethylene glycol 17 GM/SCOOP powder  Commonly known as: GLYCOLAX     Trelegy Ellipta 200-62.5-25 MCG/ACT Aepb inhaler  Generic drug: fluticasone-umeclidin-vilant               Where to Get Your Medications        [facility] [place], [place] - [address] - P [phone] - F [phone]  [address] [place] [address]      Phone: [phone]   vancomycin 125 MG capsule           Disposition: SNF  Code status: FULL  Recommended diet: regular diet  Recommended activity: activity as tolerated  Wound care: None      Follow up with:   PCP : Vevelyn Francois, APRN - NP  Vevelyn Francois, APRN - NP  [address]  [address]  [place] [address]  [phone]    Follow up in 1 week(s)            Total time in minutes spent coordinating this discharge (includes going over instructions, follow-up, prescriptions, and preparing report for sign off to her PCP) :  35 minutes

## 2021-11-21 NOTE — Care Coordination-Inpatient (Signed)
CM has  reviewed Pt medicals, Pt has a D/C order. [facility] is getting auth, as of this time they have not gotten [place].

## 2021-11-21 NOTE — Plan of Care (Signed)
Problem: Skin/Tissue Integrity  Goal: Absence of new skin breakdown  Description: 1.  Monitor for areas of redness and/or skin breakdown  2.  Assess vascular access sites hourly  3.  Every 4-6 hours minimum:  Change oxygen saturation probe site  4.  Every 4-6 hours:  If on nasal continuous positive airway pressure, respiratory therapy assess nares and determine need for appliance change or resting period.  11/21/2021 0831 by Elsie Ra, RN  Outcome: Progressing  11/20/2021 2247 by Marcha Dutton, RN  Outcome: Progressing     Problem: Pain  Goal: Verbalizes/displays adequate comfort level or baseline comfort level  Outcome: Progressing     Problem: Safety - Adult  Goal: Free from fall injury  11/21/2021 0831 by Elsie Ra, RN  Outcome: Progressing  11/20/2021 2247 by Marcha Dutton, RN  Outcome: Progressing     Problem: ABCDS Injury Assessment  Goal: Absence of physical injury  Outcome: Progressing

## 2021-11-21 NOTE — Progress Notes (Incomplete)
Patient medically clear for discharge to [facility]. Patient's friend is going to pick her up to transport her to facility at 1400.     Called report to [place], LPN at [facility]. Went over discharge education with [place] and answered all questions.    Patient dressed and belongings have been packed up for transport. PIV removed. Discharge paperwork sent with the patient.

## 2022-02-10 IMAGING — DX DG CHEST 2V
2 series · 2 of 2 positions shown · non-contrast
Comparison: 12/17/2019.

CLINICAL DATA: cough x 3 weeks, hx tobacco use, worsening SOB

EXAM:
CHEST - 2 VIEW

[chest pa]
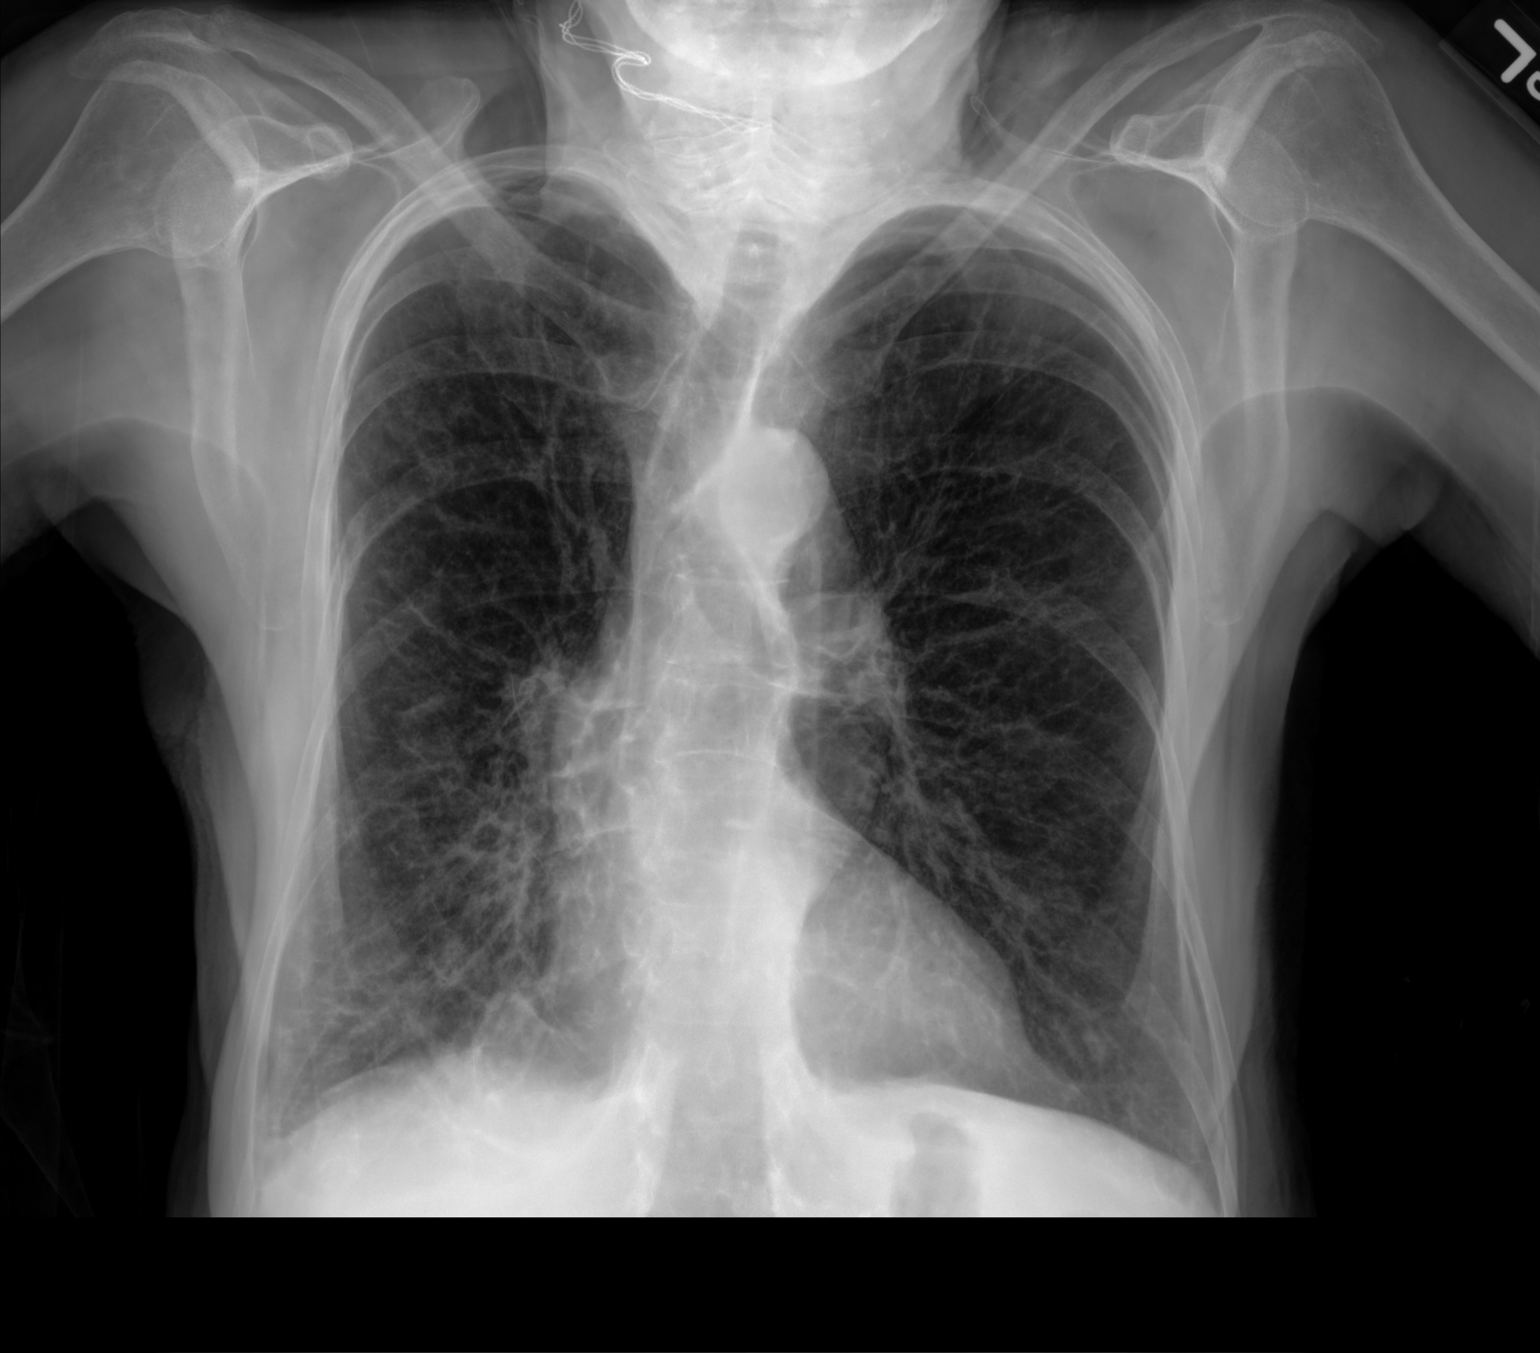

[chest lat]
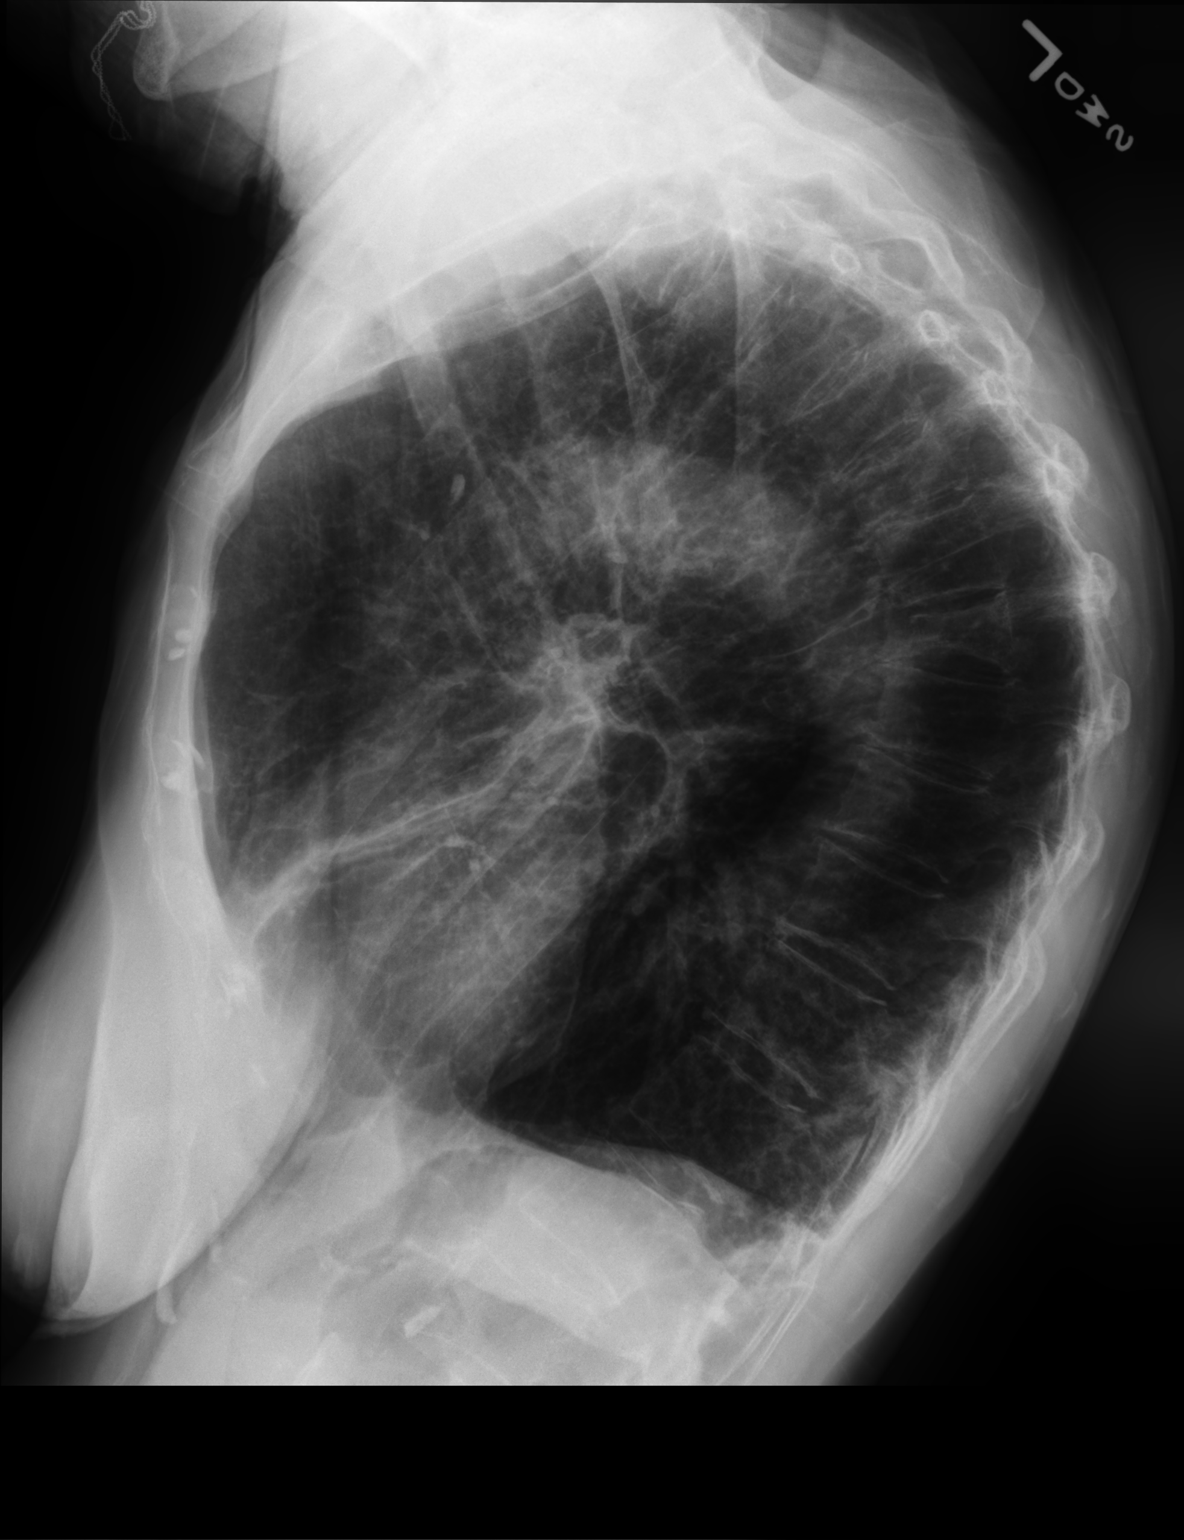

[2 of 2 positions shown; findings below may reference images not displayed]

FINDINGS: No pneumothorax or pleural effusion. Hazy right basilar opacities
and peribronchial cuffing. Diffuse interstitial prominence. Stable
cardiomediastinal silhouette. No acute osseous abnormality.
IMPRESSION: Emphysema. Hazy right basilar opacities. Differential includes
atelectasis, infection or sequela of aspiration.

Peribronchial cuffing can be seen in bronchitis or asthma.

## 2022-02-25 ENCOUNTER — Emergency Department: Admit: 2022-02-26 | Payer: MEDICARE | Primary: Family

## 2022-02-25 DIAGNOSIS — W19XXXA Unspecified fall, initial encounter: Secondary | ICD-10-CM

## 2022-02-25 DIAGNOSIS — S72141A Displaced intertrochanteric fracture of right femur, initial encounter for closed fracture: Secondary | ICD-10-CM

## 2022-02-25 NOTE — ED Provider Notes (Signed)
[facility] EMERGENCY DEPT  EMERGENCY DEPARTMENT HISTORY AND PHYSICAL EXAM      Date: 02/25/2022  Patient Name: [name]  MRN: [id number]  [place] 1945/10/23  Date of evaluation: 02/25/2022  Provider: Virgel Bouquet, DO   Note Started: 4:13 AM EST 02/26/22    HISTORY OF PRESENT ILLNESS     Chief Complaint   Patient presents with    Fall    Hip Injury       History Provided By: Patient    HPI: [name] is a 77 y.o. female with past medical history as reviewed below who presents emergency department after fall prior to arrival in which the patient landed on her right hip.  Patient states that she felt lightheaded, tripped down 1 flight of stairs, and thereafter landed on her right hip.  Currently complaining of severe right hip pain.  Patient was unable to get up on her own, has not been able to ambulate since.  Per EMS, the patient was in severe pain, with limited range of motion secondary to pain.  Denies any head injury or loss conscious.  Denies any prodromal chest pain, shortness of breath or palpitations.  No recent fever, vomiting, diarrhea.  Patient does endorse dysuria and urinary frequency.  No other symptoms complaints.    PAST MEDICAL HISTORY   Past Medical History:  Past Medical History:   Diagnosis Date    Chronic obstructive pulmonary disease ([place])     Hypercholesteremia     Insomnia     Sleep disorder        Past Surgical History:  Past Surgical History:   Procedure Laterality Date    CT GUIDED CHEST TUBE  12/06/2018    CT GUIDED CHEST TUBE    ORTHOPEDIC SURGERY  05/26/2021    LEFT HIP REVISION POSTERIOR APPROACH WITH ALLOGRAFT    PARTIAL HYSTERECTOMY (CERVIX NOT REMOVED)      SHOULDER SURGERY Left        Family History:  Family History   Family history unknown: Yes       Social History:  Social History     Tobacco Use    Smoking status: Former     Current packs/day: 0.00     Types: Cigarettes     Quit date: 01/24/2019     Years since quitting: 3.0    Smokeless tobacco: Never   Substance Use Topics     Alcohol use: Yes     Alcohol/week: 1.0 standard drink of alcohol    Drug use: Never       Allergies:  Allergies   Allergen Reactions    Cheese Nausea And Vomiting     Severe vomiting  Severe vomiting  Severe vomiting       PCP: Rigoberto Noel, APRN - NP    Current Meds:   Current Facility-Administered Medications   Medication Dose Route Frequency Provider Last Rate Last Admin    lactated ringers bolus bolus 1,000 mL  1,000 mL IntraVENous NOW Ashley Akin W, DO        albuterol sulfate HFA (PROVENTIL;VENTOLIN;PROAIR) 108 (90 Base) MCG/ACT inhaler 2 puff  2 puff Inhalation Q4H PRN Crawford Givens, MD        aspirin EC tablet 81 mg  81 mg Oral Daily Crawford Givens, MD        atorvastatin (LIPITOR) tablet 40 mg  40 mg Oral Daily Crawford Givens, MD        mirtazapine (REMERON) tablet 30 mg  30 mg  Oral QHS Crawford Givens, MD        metoprolol succinate (TOPROL XL) extended release tablet 25 mg  25 mg Oral Daily Crawford Givens, MD        pantoprazole (PROTONIX) tablet 20 mg  20 mg Oral QAM AC Crawford Givens, MD        sodium chloride flush 0.9 % injection 5-40 mL  5-40 mL IntraVENous 2 times per day Crawford Givens, MD        sodium chloride flush 0.9 % injection 5-40 mL  5-40 mL IntraVENous PRN Crawford Givens, MD        0.9 % sodium chloride infusion   IntraVENous PRN Crawford Givens, MD        Derrill Memo ON 02/27/2022] enoxaparin Sodium (LOVENOX) injection 30 mg  30 mg SubCUTAneous Daily Garnette Gunner, [place], MD        ondansetron (ZOFRAN-ODT) disintegrating tablet 4 mg  4 mg Oral Q8H PRN Crawford Givens, MD        Or    ondansetron (ZOFRAN) injection 4 mg  4 mg IntraVENous Q6H PRN Crawford Givens, MD        acetaminophen (TYLENOL) tablet 650 mg  650 mg Oral Q6H PRN Crawford Givens, MD        Or    acetaminophen (TYLENOL) suppository 650 mg  650 mg Rectal Q6H PRN Crawford Givens, MD        0.9 % sodium chloride infusion   IntraVENous  Continuous Crawford Givens, MD 75 mL/hr at 02/26/22 0247 New Bag at 02/26/22 0247     Current Outpatient Medications   Medication Sig Dispense Refill    metoprolol succinate (TOPROL XL) 25 MG extended release tablet Take 1 tablet by mouth daily      vitamin D (ERGOCALCIFEROL) 1.25 MG (50000 UT) CAPS capsule Take 1 capsule by mouth once a week      mirtazapine (REMERON) 30 MG tablet Take 1 tablet by mouth nightly      atorvastatin (LIPITOR) 40 MG tablet Take 1 tablet by mouth daily      aspirin 81 MG EC tablet Take 1 tablet by mouth daily 30 tablet 3    albuterol sulfate HFA (PROVENTIL;VENTOLIN;PROAIR) 108 (90 Base) MCG/ACT inhaler albuterol sulfate HFA 90 mcg/actuation aerosol inhaler   INHALE 2 PUFFS EVERY 6 HOURS AS NEEDED      omeprazole (PRILOSEC) 20 MG delayed release capsule Take 1 capsule by mouth daily         Social Determinants of Health:   Social Determinants of Health     Tobacco Use: Medium Risk (02/26/2022)    Patient History     Smoking Tobacco Use: Former     Smokeless Tobacco Use: Never     Passive Exposure: Not on file   Alcohol Use: Not At Risk (02/25/2022)    AUDIT-C     Frequency of Alcohol Consumption: Never     Average Number of Drinks: Patient does not drink     Frequency of Binge Drinking: Never   Financial Resource Strain: Not on file   Food Insecurity: Not on file   Transportation Needs: Not on file   Physical Activity: Not on file   Stress: Not on file   Social Connections: Not on file   Intimate Partner Violence: Not on file   Depression: Not at risk (07/15/2021)    PHQ-2     PHQ-2 Score: 0   Housing Stability: Not on file   Interpersonal Safety: Not on file   Utilities:  Not on file       PHYSICAL EXAM   Physical Exam  Constitutional:       General: She is not in acute distress.     Appearance: Normal appearance.   HENT:      Head: Normocephalic and atraumatic.      Right Ear: External ear normal.      Left Ear: External ear normal.      Mouth/Throat:      Mouth: Mucous membranes are  dry.      Pharynx: Oropharynx is clear.   Eyes:      Extraocular Movements: Extraocular movements intact.      Conjunctiva/sclera: Conjunctivae normal.   Cardiovascular:      Rate and Rhythm: Normal rate and regular rhythm.   Pulmonary:      Effort: Pulmonary effort is normal.      Breath sounds: Normal breath sounds.   Abdominal:      General: Abdomen is flat.      Palpations: Abdomen is soft.      Tenderness: There is no abdominal tenderness. There is no guarding or rebound.   Musculoskeletal:         General: No deformity or signs of injury.      Cervical back: Normal range of motion and neck supple.      Comments: Right hip tender to palpation, limited range of motion secondary to pain, 5 out of 5 motor strength, neurovascular intact, no overlying skin changes    5 out of 5 motor strength to bilateral upper extremities, neurovascularly intact   Skin:     General: Skin is warm and dry.      Comments: Ecchymosis to right wrist, ecchymosis to left forearm   Neurological:      General: No focal deficit present.      Mental Status: She is alert and oriented to person, place, and time.   Psychiatric:         Mood and Affect: Mood normal.         Behavior: Behavior normal.           SCREENINGS                   LAB, EKG AND DIAGNOSTIC RESULTS   Labs:  Recent Results (from the past 12 hour(s))   CBC    Collection Time: 02/25/22 11:12 PM   Result Value Ref Range    WBC 11.8 (H) 3.6 - 11.0 K/uL    RBC 4.29 3.80 - 5.20 M/uL    Hemoglobin 12.7 11.5 - 16.0 g/dL    Hematocrit 16.1 09.6 - 47.0 %    MCV 93.2 80.0 - 99.0 FL    MCH 29.6 26.0 - 34.0 PG    MCHC 31.8 30.0 - 36.5 g/dL    RDW 04.5 (H) 40.9 - 14.5 %    Platelets 359 150 - 400 K/uL    MPV 8.8 (L) 8.9 - 12.9 FL    Nucleated RBCs 0.0 0.0 PER 100 WBC    nRBC 0.00 0.00 - 0.01 K/uL   Comprehensive Metabolic Panel    Collection Time: 02/25/22 11:12 PM   Result Value Ref Range    Sodium 138 136 - 145 mmol/L    Potassium 4.3 3.5 - 5.1 mmol/L    Chloride 108 97 - 108 mmol/L     CO2 28 21 - 32 mmol/L    Anion Gap 2 (L) 5 - 15 mmol/L    Glucose 90  65 - 100 mg/dL    BUN 27 (H) 6 - 20 mg/dL    Creatinine 1.61 0.96 - 1.02 mg/dL    Bun/Cre Ratio 31 (H) 12 - 20      Est, Glom Filt Rate >60 >60 ml/min/1.49m2    Calcium 9.2 8.5 - 10.1 mg/dL    Total Bilirubin 0.3 0.2 - 1.0 mg/dL    AST 18 15 - 37 U/L    ALT 17 12 - 78 U/L    Alk Phosphatase 108 45 - 117 U/L    Total Protein 7.0 6.4 - 8.2 g/dL    Albumin 3.3 (L) 3.5 - 5.0 g/dL    Globulin 3.7 2.0 - 4.0 g/dL    Albumin/Globulin Ratio 0.9 (L) 1.1 - 2.2     TYPE AND SCREEN    Collection Time: 02/25/22 11:12 PM   Result Value Ref Range    Crossmatch expiration date 02/28/2022,2359     ABO/Rh O Positive     Antibody Screen Negative    Protime-INR    Collection Time: 02/25/22 11:12 PM   Result Value Ref Range    Protime 14.1 11.9 - 14.6 sec    INR 1.0 0.9 - 1.1     Troponin    Collection Time: 02/25/22 11:12 PM   Result Value Ref Range    Troponin, High Sensitivity 6 0 - 51 ng/L       EKG:.EKG interpreted by me. Shows Normal Sinus Rhythm with a HR of 85.  No STEMI. EKG interpreted by me.  Shows Normal Sinus Rhythm.  No ST elevations or depressions concerning for ischemia. Normal intervals. Normal axis. No TWIs.     Dr. Fidela Juneau    Radiologic Studies:  Non-plain film images such as CT, Ultrasound and MRI are read by the radiologist. Plain radiographic images are visualized and preliminarily interpreted by the ED Provider with the following findings: Xray Interpreted by me.  Shows R femur fracture    Interpretation per the Radiologist below, if available at the time of this note:  XR KNEE RIGHT (3 VIEWS)   Final Result   No acute fracture of the right wrist or right knee.         XR WRIST RIGHT (MIN 3 VIEWS)   Final Result   No acute fracture of the right wrist or right knee.         XR HIP 2-3 VW W PELVIS RIGHT   Final Result   Acute nondisplaced right intertrochanteric femur fracture.           EMERGENCY DEPARTMENT COURSE and DIFFERENTIAL  DIAGNOSIS/MDM   4:21 AM DDx, ED Course, and Reassessment:     77 year old female with history of multiple comorbidities who presents to the emergency department after a fall prior to arrival currently complaining of right hip pain.  Denies any head injury or loss conscious.  Patient with right hip tenderness on physical exam, limited range of motion secondary to pain, 5 out of 5 motor strength bilateral lower extremities, neurovasc intact.  Will obtain basic labs, and x-ray right hip.  Will provide pain control here.  Dispo pending results.    Clinical Management Tools:  Not Applicable    Records Reviewed (source and summary of external notes): Prior medical records and Nursing notes    Vitals:    Vitals:    02/26/22 0030 02/26/22 0130 02/26/22 0200 02/26/22 0245   BP: (!) 142/82 (!) 174/91 (!) 167/85 (!) 176/96   Pulse: 73  74 77 85   Resp: 18 14 22 22    Temp:       TempSrc:       SpO2:   90% 91%   Weight:       Height:            ED COURSE  ED Course as of 02/26/22 0421   Sun Feb 26, 2022   0300 Right hip x-ray showing right intertrochanteric femur fracture.  Will speak with orthopedic surgery. [RD]   [phone] Spoke with orthopedic surgery, Dr. Jaci Standard, advising to admit to hospitalist, will evaluate while inpatient. [RD]   [phone] Spoke with hospitalist, will admit. [RD]      ED Course User Index  [RD] Virgel Bouquet, DO       Sepsis Reassessment: Sepsis reassessment not applicable    Disposition Considerations (Tests not done, Shared Decision Making, Pt Expectation of Test or Treatment.): See MDM    Patient was given the following medications:  Medications   lactated ringers bolus bolus 1,000 mL (1,000 mLs IntraVENous Not Given 02/26/22 0030)   albuterol sulfate HFA (PROVENTIL;VENTOLIN;PROAIR) 108 (90 Base) MCG/ACT inhaler 2 puff (has no administration in time range)   aspirin EC tablet 81 mg (has no administration in time range)   atorvastatin (LIPITOR) tablet 40 mg (has no administration in time range)   mirtazapine  (REMERON) tablet 30 mg (30 mg Oral Not Given 02/26/22 0146)   metoprolol succinate (TOPROL XL) extended release tablet 25 mg (has no administration in time range)   pantoprazole (PROTONIX) tablet 20 mg (has no administration in time range)   sodium chloride flush 0.9 % injection 5-40 mL (has no administration in time range)   sodium chloride flush 0.9 % injection 5-40 mL (has no administration in time range)   0.9 % sodium chloride infusion (has no administration in time range)   enoxaparin Sodium (LOVENOX) injection 30 mg (has no administration in time range)   ondansetron (ZOFRAN-ODT) disintegrating tablet 4 mg (has no administration in time range)     Or   ondansetron (ZOFRAN) injection 4 mg (has no administration in time range)   acetaminophen (TYLENOL) tablet 650 mg (has no administration in time range)     Or   acetaminophen (TYLENOL) suppository 650 mg (has no administration in time range)   0.9 % sodium chloride infusion ( IntraVENous New Bag 02/26/22 0247)   fentaNYL (SUBLIMAZE) injection 50 mcg (50 mcg IntraVENous Given 02/25/22 2329)   sodium chloride 0.9 % bolus 1,000 mL (0 mLs IntraVENous Stopped 02/26/22 0230)   HYDROmorphone HCl PF (DILAUDID) injection 0.5 mg (0.5 mg IntraVENous Given 02/26/22 0142)       CONSULTS: (Who and What was discussed)  IP CONSULT TO ORTHOPEDIC SURGERY     Social Determinants affecting Dx or Tx: None    Smoking Cessation: Not Applicable    PROCEDURES   Unless otherwise noted above, none  Procedures      CRITICAL CARE TIME   Patient does not meet Critical Care Time, 0 minutes    ED FINAL IMPRESSION     1. Fall, initial encounter          DISPOSITION/PLAN   DISPOSITION Admitted 02/26/2022 12:56:21 AM    Admit Note: Pt is being admitted by hospitalist. The results of their tests and reason(s) for their admission have been discussed with pt and/or available family. They convey agreement and understanding for the need to be admitted and for the admission diagnosis.  PATIENT REFERRED  TO:  No follow-up provider specified.      DISCHARGE MEDICATIONS:     Medication List        STOP taking these medications      metoprolol tartrate 25 MG tablet  Commonly known as: LOPRESSOR            ASK your doctor about these medications      albuterol sulfate HFA 108 (90 Base) MCG/ACT inhaler  Commonly known as: PROVENTIL;VENTOLIN;PROAIR     aspirin 81 MG EC tablet  Take 1 tablet by mouth daily     atorvastatin 40 MG tablet  Commonly known as: LIPITOR     metoprolol succinate 25 MG extended release tablet  Commonly known as: TOPROL XL     mirtazapine 30 MG tablet  Commonly known as: REMERON     omeprazole 20 MG delayed release capsule  Commonly known as: PRILOSEC     vitamin D 1.25 MG (50000 UT) Caps capsule  Commonly known as: ERGOCALCIFEROL                DISCONTINUED MEDICATIONS:  Current Discharge Medication List          I am the Primary Clinician of Record. Clarene Critchley, DO (electronically signed)    (Please note that parts of this dictation were completed with voice recognition software. Quite often unanticipated grammatical, syntax, homophones, and other interpretive errors are inadvertently transcribed by the computer software. Please disregards these errors. Please excuse any errors that have escaped final proofreading.)     Clarene Critchley, DO  02/26/22 0421

## 2022-02-25 NOTE — ED Triage Notes (Signed)
Pt from home by EMS after GLF, c/o right hip pain. R leg is shortened and rotated, distal pulses are present and strong

## 2022-02-26 ENCOUNTER — Inpatient Hospital Stay
Admission: EM | Admit: 2022-02-26 | Discharge: 2022-03-02 | Disposition: A | Discharge status: Home Health | Payer: MEDICARE | Admitting: Student in an Organized Health Care Education/Training Program

## 2022-02-26 ENCOUNTER — Inpatient Hospital Stay: Payer: MEDICARE | Primary: Family

## 2022-02-26 ENCOUNTER — Emergency Department: Admit: 2022-02-26 | Payer: MEDICARE | Primary: Family

## 2022-02-26 ENCOUNTER — Inpatient Hospital Stay: Admit: 2022-02-26 | Payer: MEDICARE | Primary: Family

## 2022-02-26 ENCOUNTER — Inpatient Hospital Stay: Admit: 2022-02-26 | Discharge: 2022-03-01 | Payer: MEDICARE | Primary: Family

## 2022-02-26 DIAGNOSIS — S72141A Displaced intertrochanteric fracture of right femur, initial encounter for closed fracture: Secondary | ICD-10-CM

## 2022-02-26 LAB — RENAL FUNCTION PANEL
Albumin: 3.2 g/dL — ABNORMAL LOW (ref 3.5–5.0)
Anion Gap: 5 mmol/L (ref 5–15)
BUN/Creatinine Ratio: 30 — ABNORMAL HIGH (ref 12–20)
BUN: 21 mg/dL — ABNORMAL HIGH (ref 6–20)
CO2: 23 mmol/L (ref 21–32)
Calcium: 8.7 mg/dL (ref 8.5–10.1)
Chloride: 111 mmol/L — ABNORMAL HIGH (ref 97–108)
Creatinine: 0.7 mg/dL (ref 0.55–1.02)
Est, Glom Filt Rate: >60 mL/min/{1.73_m2} (ref >60)
Glucose: 112 mg/dL — ABNORMAL HIGH (ref 65–100)
Phosphorus: 2.6 mg/dL (ref 2.6–4.7)
Potassium: 3.6 mmol/L (ref 3.5–5.1)
Sodium: 139 mmol/L (ref 136–145)

## 2022-02-26 LAB — VAS DUP CAROTID BILATERAL
Body Surface Area: 1.38 m2
Left CCA dist EDV: 15.1 cm/s
Left CCA dist PSV: 65.6 cm/s
Left CCA prox EDV: 9 cm/s
Left CCA prox PSV: 60.1 cm/s
Left ECA EDV: 5.88 cm/s
Left ECA PSV: 69.8 cm/s
Left ICA dist EDV: 17.7 cm/s
Left ICA dist PSV: 59.1 cm/s
Left ICA prox EDV: 10.3 cm/s
Left ICA prox PSV: 129 cm/s
Left ICA/CCA PSV: 1.97
Left subclavian prox EDV: 6.1 cm/s
Left subclavian prox PSV: 102 cm/s
Left vertebral EDV: 10.2 cm/s
Left vertebral PSV: 76.8 cm/s
Right CCA dist EDV: 18.9 cm/s
Right CCA prox EDV: 13.7 cm/s
Right CCA prox PSV: 103 cm/s
Right ECA EDV: 0 cm/s
Right ECA PSV: 94.3 cm/s
Right ICA dist EDV: 20.5 cm/s
Right ICA dist PSV: 66 cm/s
Right ICA prox EDV: 30.9 cm/s
Right ICA prox PSV: 109 cm/s
Right ICA/CCA PSV: 1.06
Right cca dist PSV: 103 cm/s
Right subclavian prox EDV: 0 cm/s
Right subclavian prox PSV: 84.3 cm/s
Right vertebral EDV: 16.8 cm/s
Right vertebral PSV: 69.1 cm/s

## 2022-02-26 LAB — COMPREHENSIVE METABOLIC PANEL
ALT: 17 U/L (ref 12–78)
AST: 18 U/L (ref 15–37)
Albumin/Globulin Ratio: 0.9 — ABNORMAL LOW (ref 1.1–2.2)
Albumin: 3.3 g/dL — ABNORMAL LOW (ref 3.5–5.0)
Alk Phosphatase: 108 U/L (ref 45–117)
Anion Gap: 2 mmol/L — ABNORMAL LOW (ref 5–15)
BUN: 27 mg/dL — ABNORMAL HIGH (ref 6–20)
Bun/Cre Ratio: 31 — ABNORMAL HIGH (ref 12–20)
CO2: 28 mmol/L (ref 21–32)
Calcium: 9.2 mg/dL (ref 8.5–10.1)
Chloride: 108 mmol/L (ref 97–108)
Creatinine: 0.86 mg/dL (ref 0.55–1.02)
Est, Glom Filt Rate: >60 mL/min/{1.73_m2} (ref >60)
Globulin: 3.7 g/dL (ref 2.0–4.0)
Glucose: 90 mg/dL (ref 65–100)
Potassium: 4.3 mmol/L (ref 3.5–5.1)
Sodium: 138 mmol/L (ref 136–145)
Total Bilirubin: 0.3 mg/dL (ref 0.2–1.0)
Total Protein: 7 g/dL (ref 6.4–8.2)

## 2022-02-26 LAB — CBC
Hematocrit: 40 % (ref 35.0–47.0)
Hemoglobin: 12.7 g/dL (ref 11.5–16.0)
MCH: 29.6 PG (ref 26.0–34.0)
MCHC: 31.8 g/dL (ref 30.0–36.5)
MCV: 93.2 FL (ref 80.0–99.0)
MPV: 8.8 FL — ABNORMAL LOW (ref 8.9–12.9)
Nucleated RBCs: 0 PER 100 WBC
Platelets: 359 10*3/uL (ref 150–400)
RBC: 4.29 M/uL (ref 3.80–5.20)
RDW: 15.6 % — ABNORMAL HIGH (ref 11.5–14.5)
WBC: 11.8 10*3/uL — ABNORMAL HIGH (ref 3.6–11.0)
nRBC: 0 10*3/uL (ref 0.00–0.01)

## 2022-02-26 LAB — EKG 12-LEAD
Atrial Rate: 71 {beats}/min
Diagnosis: NORMAL
P Axis: 56 degrees
P-R Interval: 178 ms
Q-T Interval: 386 ms
QRS Duration: 72 ms
QTc Calculation (Bazett): 419 ms
R Axis: 24 degrees
T Axis: 62 degrees
Ventricular Rate: 71 {beats}/min

## 2022-02-26 LAB — PROTIME-INR
INR: 1 (ref 0.9–1.1)
Protime: 14.1 s (ref 11.9–14.6)

## 2022-02-26 LAB — TYPE AND SCREEN
ABO/Rh: O POS
Antibody Screen: NEGATIVE

## 2022-02-26 LAB — TROPONIN: Troponin, High Sensitivity: 6 ng/L (ref 0–51)

## 2022-02-26 MED ORDER — HYDROMORPHONE HCL PF 1 MG/ML IJ SOLN
1 MG/ML | INTRAMUSCULAR | Status: AC
Start: 2022-02-26 — End: 2022-02-26

## 2022-02-26 MED ORDER — ONDANSETRON HCL 4 MG/2ML IJ SOLN
42 MG/2ML | Freq: Four times a day (QID) | INTRAMUSCULAR | Status: DC | PRN
Start: 2022-02-26 — End: 2022-03-02
  Administered 2022-02-26: 10:00:00 4 mg via INTRAVENOUS

## 2022-02-26 MED ORDER — MIRTAZAPINE 30 MG PO TABS
30 MG | Freq: Every evening | ORAL | Status: DC
Start: 2022-02-26 — End: 2022-03-02
  Administered 2022-02-27 – 2022-03-02 (×4): 30 mg via ORAL

## 2022-02-26 MED ORDER — PANTOPRAZOLE SODIUM 20 MG PO TBEC
20 MG | Freq: Every day | ORAL | Status: DC
Start: 2022-02-26 — End: 2022-03-02
  Administered 2022-02-26 – 2022-03-02 (×5): 20 mg via ORAL

## 2022-02-26 MED ORDER — HYDROMORPHONE HCL 1 MG/ML IJ SOLN
1 MG/ML | INTRAMUSCULAR | Status: AC
Start: 2022-02-26 — End: 2022-02-26
  Administered 2022-02-26: 07:00:00 0.5 via INTRAVENOUS

## 2022-02-26 MED ORDER — HYDROMORPHONE HCL PF 1 MG/ML IJ SOLN
1 MG/ML | INTRAMUSCULAR | Status: DC | PRN
Start: 2022-02-26 — End: 2022-03-01

## 2022-02-26 MED ORDER — SODIUM CHLORIDE 0.9 % IV SOLN
0.9 % | INTRAVENOUS | Status: DC | PRN
Start: 2022-02-26 — End: 2022-03-02

## 2022-02-26 MED ORDER — NORMAL SALINE FLUSH 0.9 % IV SOLN
0.9 % | INTRAVENOUS | Status: DC | PRN
Start: 2022-02-26 — End: 2022-03-02

## 2022-02-26 MED ORDER — OXYCODONE-ACETAMINOPHEN 5-325 MG PO TABS
5-325 MG | ORAL | Status: DC | PRN
Start: 2022-02-26 — End: 2022-03-01
  Administered 2022-02-28 – 2022-03-01 (×3): 1 via ORAL

## 2022-02-26 MED ORDER — HYDROMORPHONE HCL PF 1 MG/ML IJ SOLN
1 MG/ML | Freq: Once | INTRAMUSCULAR | Status: DC
Start: 2022-02-26 — End: 2022-02-26

## 2022-02-26 MED ORDER — LACTATED RINGERS IV BOLUS
INTRAVENOUS | Status: AC
Start: 2022-02-26 — End: 2022-02-26

## 2022-02-26 MED ORDER — ATORVASTATIN CALCIUM 40 MG PO TABS
40 MG | Freq: Every day | ORAL | Status: AC
Start: 2022-02-26 — End: 2022-03-02
  Administered 2022-02-26 – 2022-03-02 (×5): 40 mg via ORAL

## 2022-02-26 MED ORDER — SODIUM CHLORIDE 0.9 % IV SOLN
0.9 % | INTRAVENOUS | Status: AC
Start: 2022-02-26 — End: 2022-02-28
  Administered 2022-02-26 – 2022-02-27 (×2): via INTRAVENOUS

## 2022-02-26 MED ORDER — ASPIRIN 81 MG PO TBEC
81 MG | Freq: Every day | ORAL | Status: AC
Start: 2022-02-26 — End: 2022-02-27
  Administered 2022-02-26: 13:00:00 81 mg via ORAL

## 2022-02-26 MED ORDER — ALBUTEROL SULFATE HFA 108 (90 BASE) MCG/ACT IN AERS
108 (90 Base) MCG/ACT | RESPIRATORY_TRACT | Status: AC | PRN
Start: 2022-02-26 — End: 2022-03-02

## 2022-02-26 MED ORDER — METOPROLOL SUCCINATE ER 50 MG PO TB24
50 MG | Freq: Every day | ORAL | Status: DC
Start: 2022-02-26 — End: 2022-03-02
  Administered 2022-02-26 – 2022-03-02 (×4): 25 mg via ORAL

## 2022-02-26 MED ORDER — FENTANYL CITRATE (PF) 100 MCG/2ML IJ SOLN
100 MCG/2ML | INTRAMUSCULAR | Status: AC
Start: 2022-02-26 — End: 2022-02-25
  Administered 2022-02-26: 04:00:00 50 ug via INTRAVENOUS

## 2022-02-26 MED ORDER — NORMAL SALINE FLUSH 0.9 % IV SOLN
0.9 % | Freq: Two times a day (BID) | INTRAVENOUS | Status: DC
Start: 2022-02-26 — End: 2022-03-02
  Administered 2022-02-26 – 2022-03-02 (×8): 10 mL via INTRAVENOUS

## 2022-02-26 MED ORDER — ACETAMINOPHEN 325 MG PO TABS
325 | Freq: Four times a day (QID) | ORAL | Status: DC | PRN
Start: 2022-02-26 — End: 2022-03-01
  Administered 2022-02-26: 10:00:00 650 mg via ORAL

## 2022-02-26 MED ORDER — HYDROMORPHONE HCL PF 1 MG/ML IJ SOLN
1 | INTRAMUSCULAR | Status: DC | PRN
Start: 2022-02-26 — End: 2022-02-26

## 2022-02-26 MED ORDER — ENOXAPARIN SODIUM 30 MG/0.3ML IJ SOSY
300.3 MG/0.3ML | Freq: Every day | INTRAMUSCULAR | Status: DC
Start: 2022-02-26 — End: 2022-02-27

## 2022-02-26 MED ORDER — SODIUM CHLORIDE 0.9 % IV BOLUS
0.9 % | Freq: Once | INTRAVENOUS | Status: AC
Start: 2022-02-26 — End: 2022-02-26
  Administered 2022-02-26: 06:00:00 1000 mL via INTRAVENOUS

## 2022-02-26 MED ORDER — ONDANSETRON 4 MG PO TBDP
4 MG | Freq: Three times a day (TID) | ORAL | Status: AC | PRN
Start: 2022-02-26 — End: 2022-03-02

## 2022-02-26 MED ORDER — ACETAMINOPHEN 650 MG RE SUPP
650 MG | Freq: Four times a day (QID) | RECTAL | Status: DC | PRN
Start: 2022-02-26 — End: 2022-03-01

## 2022-02-26 MED FILL — LACTATED RINGERS IV SOLN: INTRAVENOUS | Qty: 1000

## 2022-02-26 MED FILL — ONDANSETRON HCL 4 MG/2ML IJ SOLN: 4 MG/2ML | INTRAMUSCULAR | Qty: 2

## 2022-02-26 MED FILL — ATORVASTATIN CALCIUM 40 MG PO TABS: 40 MG | ORAL | Qty: 1

## 2022-02-26 MED FILL — SODIUM CHLORIDE 0.9 % IV SOLN: 0.9 % | INTRAVENOUS | Qty: 1000

## 2022-02-26 MED FILL — HYDROMORPHONE HCL 1 MG/ML IJ SOLN: 1 MG/ML | INTRAMUSCULAR | Qty: 0.5

## 2022-02-26 MED FILL — TYLENOL 325 MG PO TABS: 325 MG | ORAL | Qty: 2

## 2022-02-26 MED FILL — ASPIRIN LOW DOSE 81 MG PO TBEC: 81 MG | ORAL | Qty: 1

## 2022-02-26 MED FILL — METOPROLOL SUCCINATE ER 50 MG PO TB24: 50 MG | ORAL | Qty: 1

## 2022-02-26 MED FILL — FENTANYL CITRATE (PF) 100 MCG/2ML IJ SOLN: 100 MCG/2ML | INTRAMUSCULAR | Qty: 2

## 2022-02-26 MED FILL — SODIUM CHLORIDE FLUSH 0.9 % IV SOLN: 0.9 % | INTRAVENOUS | Qty: 40

## 2022-02-26 MED FILL — PANTOPRAZOLE SODIUM 20 MG PO TBEC: 20 MG | ORAL | Qty: 1

## 2022-02-26 NOTE — ED Notes (Signed)
[name] acknowledged report being done at this time.

## 2022-02-26 NOTE — ED Notes (Signed)
ED TO INPATIENT SBAR HANDOFF    Patient Name: [name]   Preferred Name: [name]  DOB: November 03, 1945  77 y.o.   Family/Caregiver Present: no   Code Status Order: Full Code  PO Status: NPO. Patient going to surgery this morning.  Telemetry Order:   C-SSRS: Risk of Suicide: No Risk  Sitter no  Restraints:     Sepsis Risk Score Sepsis Risk Score: 0.78    Situation  Chief Complaint   Patient presents with    Fall    Hip Injury     Brief Description of Patient's Condition: Patient came in due to [facility]. Patient was complaining of right hip pain which was shortened and rotated. Patient has pedal pulses. Patient uses the purewick and had a bowel movement down here on this shift.    Mental Status: oriented, alert, coherent, logical, thought processes intact, and able to concentrate and follow conversation  Arrived from:Home  Imaging:   XR KNEE RIGHT (3 VIEWS)   Final Result   No acute fracture of the right wrist or right knee.         XR WRIST RIGHT (MIN 3 VIEWS)   Final Result   No acute fracture of the right wrist or right knee.         XR HIP 2-3 VW W PELVIS RIGHT   Final Result   Acute nondisplaced right intertrochanteric femur fracture.        Abnormal labs:   Abnormal Labs Reviewed   CBC - Abnormal; Notable for the following components:       Result Value    WBC 11.8 (*)     RDW 15.6 (*)     MPV 8.8 (*)     All other components within normal limits   COMPREHENSIVE METABOLIC PANEL - Abnormal; Notable for the following components:    Anion Gap 2 (*)     BUN 27 (*)     Bun/Cre Ratio 31 (*)     Albumin 3.3 (*)     Albumin/Globulin Ratio 0.9 (*)     All other components within normal limits       Background  Allergies:   Allergies   Allergen Reactions    Cheese Nausea And Vomiting     Severe vomiting  Severe vomiting  Severe vomiting     History:   Past Medical History:   Diagnosis Date    Chronic obstructive pulmonary disease (HCC)     Hypercholesteremia     Insomnia     Sleep disorder        Assessment  Vitals: MEWS Score: 1   Level of Consciousness: Alert (0)   Vitals:    02/26/22 0000 02/26/22 0030 02/26/22 0130 02/26/22 0200   BP: 137/87 (!) 142/82 (!) 174/91 (!) 167/85   Pulse: 74 73 74 77   Resp: 16 18 14 22    Temp:       TempSrc:       SpO2: 92%   90%   Weight:       Height:         Deterioration Index (DI): Deterioration Index: 32.75  Deterioration Index (DI) Interventions Performed:    O2 Flow Rate:    O2 Device:    Cardiac Rhythm: Cardiac Rhythm: Normal sinus rhythm  Critical Lab Results: @LABCR @  Cultures: None  NIH Score: NIH     Active LDA's:   Peripheral IV 02/25/22 Proximal;Right Forearm (Active)   Site Assessment Clean, dry & intact  02/25/22 2312   Line Status Blood return noted 02/25/22 2312   Phlebitis Assessment Erythema at access site with or without pain 02/25/22 2312   Infiltration Assessment 0 02/25/22 2312   Dressing Status New dressing applied;Clean, dry & intact 02/25/22 2312     Active Central Lines:                          Active Wounds:    Active Foley's:    Active Feeding Tubes:      Administered Medications:   Medications   lactated ringers bolus bolus 1,000 mL (1,000 mLs IntraVENous Not Given 02/26/22 0030)   albuterol sulfate HFA (PROVENTIL;VENTOLIN;PROAIR) 108 (90 Base) MCG/ACT inhaler 2 puff (has no administration in time range)   aspirin EC tablet 81 mg (has no administration in time range)   atorvastatin (LIPITOR) tablet 40 mg (has no administration in time range)   mirtazapine (REMERON) tablet 30 mg (30 mg Oral Not Given 02/26/22 0146)   metoprolol succinate (TOPROL XL) extended release tablet 25 mg (has no administration in time range)   pantoprazole (PROTONIX) tablet 20 mg (has no administration in time range)   sodium chloride flush 0.9 % injection 5-40 mL (has no administration in time range)   sodium chloride flush 0.9 % injection 5-40 mL (has no administration in time range)   0.9 % sodium chloride infusion (has no administration in time range)   enoxaparin Sodium (LOVENOX) injection 30 mg (has no  administration in time range)   ondansetron (ZOFRAN-ODT) disintegrating tablet 4 mg (has no administration in time range)     Or   ondansetron (ZOFRAN) injection 4 mg (has no administration in time range)   acetaminophen (TYLENOL) tablet 650 mg (has no administration in time range)     Or   acetaminophen (TYLENOL) suppository 650 mg (has no administration in time range)   0.9 % sodium chloride infusion ( IntraVENous New Bag 02/26/22 0247)   fentaNYL (SUBLIMAZE) injection 50 mcg (50 mcg IntraVENous Given 02/25/22 2329)   sodium chloride 0.9 % bolus 1,000 mL (0 mLs IntraVENous Stopped 02/26/22 0230)   HYDROmorphone HCl PF (DILAUDID) injection 0.5 mg (0.5 mg IntraVENous Given 02/26/22 0142)     Last documented pain medication administration: Dilaudid 0.5mg  @ 0142.  Pertinent or High Risk Medications/Drips: no   If Yes, please provide details: none  Blood Product Administration: no  If Yes, please provide details: none  Process Protocols/Bundles: none    Recommendation  Incomplete STAT orders: none  Overdue Medications: none  Patient Belongings:    Additional Comments: none  If any further questions, please call Sending RN at [phone].    Electronically signed by: Electronically signed by Robinette Haines, RN on 02/26/2022 at 3:09 AM

## 2022-02-26 NOTE — H&P (Signed)
Hospitalist History & Physical Notes.           [facility].              Name : [name]      MRN number : [id number]     Date of Birth : Sep 04, 1945     Subjective :   Chief Complaint : Right hip pain after fall at home    Source of information : Patient, ED provider.  Reviewed previous records.    History of present illness:   [name] is  77 y.o. female with COPD, hyperlipidemia presents to the emergency room after a fall at home.  States she was feeling dizzy since morning and not feeling good.  She tried to get up to go to the bathroom, felt dizzy and fell.  She knows that she is falling, did not lose consciousness, shouted to so family members attended immediately.  Denies any chest pain, palpitations, cough or trouble breathing, nausea or vomiting.  No urinary frequency or burning.    Found with right intertrochanteric nondisplaced fracture.  Already contacted orthopedic physician, will be following up.    Past Medical History:   Diagnosis Date    Chronic obstructive pulmonary disease ([place])     Hypercholesteremia     Insomnia     Sleep disorder      Past Surgical History:   Procedure Laterality Date    CT GUIDED CHEST TUBE  12/06/2018    CT GUIDED CHEST TUBE    ORTHOPEDIC SURGERY  05/26/2021    LEFT HIP REVISION POSTERIOR APPROACH WITH ALLOGRAFT    PARTIAL HYSTERECTOMY (CERVIX NOT REMOVED)      SHOULDER SURGERY Left      Family History   Family history unknown: Yes      Social History     Tobacco Use    Smoking status: Former     Current packs/day: 0.00     Types: Cigarettes     Quit date: 01/24/2019     Years since quitting: 3.0    Smokeless tobacco: Never   Substance Use Topics    Alcohol use: Yes     Alcohol/week: 1.0 standard drink of alcohol       Allergies   Allergen Reactions    Cheese Nausea And Vomiting     Severe vomiting  Severe vomiting  Severe vomiting      Current Facility-Administered Medications   Medication Dose Route Frequency Provider Last Rate Last Admin    lactated  ringers bolus bolus 1,000 mL  1,000 mL IntraVENous NOW Dowell, Russell W, DO        sodium chloride 0.9 % bolus 1,000 mL  1,000 mL IntraVENous Once Virgel Bouquet, DO 983.6 mL/hr at 02/26/22 0032 1,000 mL at 02/26/22 0032     Current Outpatient Medications   Medication Sig Dispense Refill    metoprolol succinate (TOPROL XL) 25 MG extended release tablet Take 1 tablet by mouth daily      vitamin D (ERGOCALCIFEROL) 1.25 MG (50000 UT) CAPS capsule Take 1 capsule by mouth once a week      mirtazapine (REMERON) 30 MG tablet Take 1 tablet by mouth nightly      atorvastatin (LIPITOR) 40 MG tablet Take 1 tablet by mouth daily      aspirin 81 MG EC tablet Take 1 tablet by mouth daily 30 tablet 3    albuterol sulfate HFA (PROVENTIL;VENTOLIN;PROAIR) 108 (90 Base) MCG/ACT inhaler albuterol sulfate HFA  90 mcg/actuation aerosol inhaler   INHALE 2 PUFFS EVERY 6 HOURS AS NEEDED      omeprazole (PRILOSEC) 20 MG delayed release capsule Take 1 capsule by mouth daily              Review of Systems: All the systems are reviewed, she denies any other complaints other than HPI.  .    Vitals:     Vitals:    02/25/22 2315 02/25/22 2330 02/26/22 0000 02/26/22 0030   BP:   137/87 (!) 142/82   Pulse: 76 71 74 73   Resp: 15 19 16 18    Temp:       TempSrc:       SpO2: 93% 95% 92%    Weight:       Height:           Physical Exam:   General : Thin built, looks comfortable, no acute respiratory distress noted.  HEENT : PERRLA, dry oral mucosa, atraumatic normocephalic, Normal ear and nose.  Neck : Supple, no JVD,.  Lungs : Breath sounds with moderate air entry bilaterally, no wheezes or rales, no accessory muscle use.  CVS : Rhythm rate regular, S1+, S2+, no murmur or gallop.  Abdomen : Soft, nontender,  bowel sounds active.  Extremities : No edema noted,  pedal pulses palpable.  Musculoskeletal :  no joint swelling or effusion, muscle tone and power appears decreased  Skin : Dry, warm, .  Neurological : Awake, alert, oriented to time place  person.    Psychiatric : Mood and affect appears appropriate .         Data Review:   Recent Results (from the past 24 hour(s))   CBC    Collection Time: 02/25/22 11:12 PM   Result Value Ref Range    WBC 11.8 (H) 3.6 - 11.0 K/uL    RBC 4.29 3.80 - 5.20 M/uL    Hemoglobin 12.7 11.5 - 16.0 g/dL    Hematocrit 40.0 35.0 - 47.0 %    MCV 93.2 80.0 - 99.0 FL    MCH 29.6 26.0 - 34.0 PG    MCHC 31.8 30.0 - 36.5 g/dL    RDW 15.6 (H) 11.5 - 14.5 %    Platelets 359 150 - 400 K/uL    MPV 8.8 (L) 8.9 - 12.9 FL    Nucleated RBCs 0.0 0.0 PER 100 WBC    nRBC 0.00 0.00 - 0.01 K/uL   Comprehensive Metabolic Panel    Collection Time: 02/25/22 11:12 PM   Result Value Ref Range    Sodium 138 136 - 145 mmol/L    Potassium 4.3 3.5 - 5.1 mmol/L    Chloride 108 97 - 108 mmol/L    CO2 28 21 - 32 mmol/L    Anion Gap 2 (L) 5 - 15 mmol/L    Glucose 90 65 - 100 mg/dL    BUN 27 (H) 6 - 20 mg/dL    Creatinine 0.86 0.55 - 1.02 mg/dL    Bun/Cre Ratio 31 (H) 12 - 20      Est, Glom Filt Rate >60 >60 ml/min/1.81m2    Calcium 9.2 8.5 - 10.1 mg/dL    Total Bilirubin 0.3 0.2 - 1.0 mg/dL    AST 18 15 - 37 U/L    ALT 17 12 - 78 U/L    Alk Phosphatase 108 45 - 117 U/L    Total Protein 7.0 6.4 - 8.2 g/dL    Albumin 3.3 (L) 3.5 -  5.0 g/dL    Globulin 3.7 2.0 - 4.0 g/dL    Albumin/Globulin Ratio 0.9 (L) 1.1 - 2.2     TYPE AND SCREEN    Collection Time: 02/25/22 11:12 PM   Result Value Ref Range    Crossmatch expiration date 02/28/2022,2359     ABO/Rh O Positive     Antibody Screen Negative    Protime-INR    Collection Time: 02/25/22 11:12 PM   Result Value Ref Range    Protime 14.1 11.9 - 14.6 sec    INR 1.0 0.9 - 1.1         Radiologic Studies :   Imaging:   XR KNEE RIGHT (3 VIEWS)   Final Result   No acute fracture of the right wrist or right knee.         XR WRIST RIGHT (MIN 3 VIEWS)   Final Result   No acute fracture of the right wrist or right knee.         XR HIP 2-3 VW W PELVIS RIGHT   Final Result   Acute nondisplaced right intertrochanteric femur  fracture.             Assessment and Plan :     Fracture right intertrochanteric displaced closed initial encounter: Consultation placed to orthopedics.    Dizziness: Etiology unclear.  Once she is stable need further evaluation.    Chronic obstructive pulmonary disease: Only on as needed albuterol inhaler which is continued.  Stable with no evidence of pulmonary exacerbation at this time.    Hyperlipidemia: On atorvastatin we will continue    Essential hypertension: Mild, on a very small dose of metoprolol XL 25 mg daily that is continued    Admitted to orthopedic floor.      Medications Home :    Reviewed with external Rx history    Code status : Full code    VTE prophylaxis :  Enoxaparin -will start after surgery    Advance Medical directive : Health care decision maker information is on file.         Discussion/MDM:   I have discussed patient's presentation/findings and clinical course to date with ED provider.     Given the patient's current clinical presentation, I have a high level of concern for decompensation if discharged from the emergency department as patient has multiple medical comorbidities with increased risk of morbidity and mortality  that warrants admission to hospital.     I have reviewed patient's presenting subjective and objective findings, as well as all laboratory studies, imaging studies, and vital signs to date as well as treatment rendered and patient's response to those treatments.  In addition, prior medical, surgical and relevant social and family histories were reviewed.      CC : Rigoberto Noel, APRN - NP  Signed By: Crawford Givens, MD     February 26, 2022      This dictation was done by dragon, computer voice recognition software.  Often unanticipated grammatical, syntax, Homer phones and other interpretive errors are inadvertently transcribed.  Please excuse errors that have escaped final proofreading.

## 2022-02-26 NOTE — ED Notes (Signed)
Pt. Assisted onto bedpan, tolerated well

## 2022-02-26 NOTE — Progress Notes (Signed)
Carotid duplex completed. Final results to follow.

## 2022-02-26 NOTE — Plan of Care (Signed)
Problem: Discharge Planning  Goal: Discharge to home or other facility with appropriate resources  Outcome: Progressing  Flowsheets (Taken 02/26/2022 0518)  Discharge to home or other facility with appropriate resources:   Identify barriers to discharge with patient and caregiver   Arrange for needed discharge resources and transportation as appropriate     Problem: Pain  Goal: Verbalizes/displays adequate comfort level or baseline comfort level  Outcome: Progressing     Problem: Skin/Tissue Integrity  Goal: Absence of new skin breakdown  Description: 1.  Monitor for areas of redness and/or skin breakdown  2.  Assess vascular access sites hourly  3.  Every 4-6 hours minimum:  Change oxygen saturation probe site  4.  Every 4-6 hours:  If on nasal continuous positive airway pressure, respiratory therapy assess nares and determine need for appliance change or resting period.  Outcome: Progressing

## 2022-02-26 NOTE — Care Coordination-Inpatient (Signed)
02/26/22 1617   Service Assessment   Patient Orientation Alert and Oriented   Cognition Alert   History Provided By Patient   Primary Caregiver Other (Comment)  (roommates)   Support Systems Family Members;Friends/Neighbors   PCP Verified by CM Yes  Vilinda Blanks, NP)   Last Visit to PCP Within last 3 months   Prior Functional Level Cooking;Housework;Shopping;Mobility;Assistance with the following:   Current Functional Level Assistance with the following:;Cooking;Housework;Shopping;Mobility   Can patient return to prior living arrangement Yes   Ability to make needs known: Good   Family able to assist with home care needs: Yes   Would you like for me to discuss the discharge plan with any other family members/significant others, and if so, who? No   Financial Resources [employer] Resources None   Social/Functional History   Lives With Family;Other (comment)  (roommate)   Home Equipment Cane;Walker, rolling   Receives Help From Family     Met with the pt at bedside.  Stated she is ind with adls and assist with iadls.  Has a cane and walker available for her use at home.  Stated her roommates provide for daily iadls and assist with out of home appointments, and shopping. Her friend Barnett Applebaum [email or web address]@ [phone] will transport at DC.  Expect Home with Home Health.    CM to follow for needs  HH/SNF rehab.    Advance Care Planning     General Advance Care Planning (ACP) Conversation    Date of Conversation: 02/25/2022  Conducted with: Patient with Decision Making Capacity    Healthcare Decision Maker:  No healthcare decision makers have been documented.  Click here to complete Haematologist of the Research scientist (physical sciences) Relationship (ie "Primary")   Today we documented Decision Maker(s) consistent with Legal Next of Kin hierarchy.    Content/Action Overview:  Has ACP document(s) on file - reflects the patient's care preferences  Reviewed DNR/DNI and patient elects Full Code  (Attempt Resuscitation)        Length of Voluntary ACP Conversation in minutes:  <16 minutes (Non-Billable)    Lance Coon, RN

## 2022-02-26 NOTE — Plan of Care (Signed)
Problem: Discharge Planning  Goal: Discharge to home or other facility with appropriate resources  02/26/2022 0937 by Burnice Logan', RN  Outcome: Progressing  02/26/2022 0553 by Glenis Smoker, RN  Outcome: Progressing  Flowsheets  Taken 02/26/2022 0530  Discharge to home or other facility with appropriate resources:   Identify barriers to discharge with patient and caregiver   Arrange for needed discharge resources and transportation as appropriate  Taken 02/26/2022 0518  Discharge to home or other facility with appropriate resources:   Identify barriers to discharge with patient and caregiver   Arrange for needed discharge resources and transportation as appropriate     Problem: Pain  Goal: Verbalizes/displays adequate comfort level or baseline comfort level  02/26/2022 0937 by Burnice Logan', RN  Outcome: Progressing  02/26/2022 0553 by Glenis Smoker, RN  Outcome: Progressing     Problem: Skin/Tissue Integrity  Goal: Absence of new skin breakdown  Description: 1.  Monitor for areas of redness and/or skin breakdown  2.  Assess vascular access sites hourly  3.  Every 4-6 hours minimum:  Change oxygen saturation probe site  4.  Every 4-6 hours:  If on nasal continuous positive airway pressure, respiratory therapy assess nares and determine need for appliance change or resting period.  02/26/2022 7858 by Burnice Logan', RN  Outcome: Progressing  02/26/2022 0553 by Glenis Smoker, RN  Outcome: Progressing     Problem: Safety - Adult  Goal: Free from fall injury  Outcome: Progressing     Problem: ABCDS Injury Assessment  Goal: Absence of physical injury  Outcome: Progressing

## 2022-02-26 NOTE — Consults (Signed)
Department of Orthopedic Surgery  Attending Consult Note        Reason for Consult: Evaluation right intertrochanteric hip fracture secondary to ground-level fall    Requesting Physician:  Dr. Ashley Akin    CHIEF COMPLAINT: Right hip pain    History Obtained From:  patient, electronic medical record    HISTORY OF PRESENT ILLNESS:                The patient is a 77 y.o. female who presents with a slightly displaced impacted right intertrochanteric hip fracture after ground-level fall when she got dizzy getting out of bed to go to the bathroom and fell directly onto the right hip.  She denies any loss of consciousness, chest pain associated with the fall, and denies hitting her head.  She has had no shortness of breath or any other constitutional symptoms.  She denies any pain in the left upper extremity or the right upper extremity.  She denies any pain in the left lower extremity.  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    Past Medical History:        Diagnosis Date    Chronic obstructive pulmonary disease ([place])     Hypercholesteremia     Insomnia     Sleep disorder      Past Surgical History:        Procedure Laterality Date    CT GUIDED CHEST TUBE  12/06/2018    CT GUIDED CHEST TUBE    ORTHOPEDIC SURGERY  05/26/2021    LEFT HIP REVISION POSTERIOR APPROACH WITH ALLOGRAFT    PARTIAL HYSTERECTOMY (CERVIX NOT REMOVED)      SHOULDER SURGERY Left      Current Medications:   Current Facility-Administered Medications: lactated ringers bolus bolus 1,000 mL, 1,000 mL, IntraVENous, NOW  albuterol sulfate HFA (PROVENTIL;VENTOLIN;PROAIR) 108 (90 Base) MCG/ACT inhaler 2 puff, 2 puff, Inhalation, Q4H PRN  aspirin EC tablet 81 mg, 81 mg, Oral, Daily  atorvastatin (LIPITOR) tablet 40 mg, 40 mg, Oral, Daily  mirtazapine (REMERON) tablet 30 mg, 30 mg, Oral, QHS  metoprolol succinate (TOPROL XL)  extended release tablet 25 mg, 25 mg, Oral, Daily  pantoprazole (PROTONIX) tablet 20 mg, 20 mg, Oral, QAM AC  sodium chloride flush 0.9 % injection 5-40 mL, 5-40 mL, IntraVENous, 2 times per day  sodium chloride flush 0.9 % injection 5-40 mL, 5-40 mL, IntraVENous, PRN  0.9 % sodium chloride infusion, , IntraVENous, PRN  [START ON 02/27/2022] enoxaparin Sodium (LOVENOX) injection 30 mg, 30 mg, SubCUTAneous, Daily  ondansetron (ZOFRAN-ODT) disintegrating tablet 4 mg, 4 mg, Oral, Q8H PRN **OR** ondansetron (ZOFRAN) injection 4 mg, 4 mg, IntraVENous, Q6H PRN  acetaminophen (TYLENOL) tablet 650 mg, 650 mg, Oral, Q6H PRN **OR** acetaminophen (TYLENOL) suppository 650 mg, 650 mg, Rectal, Q6H PRN  0.9 % sodium chloride infusion, , IntraVENous, Continuous  Allergies:  Cheese    Social History:   TOBACCO:  Former smoker.  Type of tobacco used:  Cigarettes.  Quantity per day:  .5 pack(s).  Duration of tobacco use:  10+ years.  Approximate Quit Date:  2021.  ETOH:  Current alcohol usage:  Type of Drink(s):  unknown.  Frequency of use:  Weekly.  Duration of alcohol use:  10+ years.  Approximate date of last drink:  last week.  DRUGS:  Never used recreational drugs  Family History:  Family history unknown: Yes     REVIEW OF SYSTEMS:    CONSTITUTIONAL:  negative  HEENT:  negative  CARDIOVASCULAR:  negative for  chest pain, dyspnea, palpitations, orthopnea, PND, edema  MUSCULOSKELETAL:  positive for  myalgias, arthralgias, pain, joint swelling, muscle weakness, bone pain, and right hip pain  NEUROLOGICAL:  negative.  There is full range of motion to the ankles and toes both lower extremities.  Right leg is slightly shortened and externally rotated.    PHYSICAL EXAM:    VITALS:  BP 115/83   Pulse (!) 107   Temp 98.1 F (36.7 C) (Oral)   Resp 16   Ht 1.6 m (5\' 3" )   Wt 42.6 kg (94 lb)   SpO2 92%   BMI 16.65 kg/m   CONSTITUTIONAL:  awake, alert, cooperative, no apparent distress, and appears stated age  CARDIOVASCULAR:   regular rate and rhythm  MUSCULOSKELETAL:  RIGHT HIP:  redness absent  warmth absent  swelling present  tenderness present  NEUROLOGIC:  Motor Exam:  Right ankle dorsi-flexion 5 out of 5  Right ankle plantar-flexion 5 out of 5  Sensory:  Touch:  Right Lower Extremity:  normal  SKIN:  no bruising or bleeding and normal skin color, texture, turgor    DATA:    CBC:   Lab Results   Component Value Date/Time    WBC 11.8 02/25/2022 11:12 PM    RBC 4.29 02/25/2022 11:12 PM    HGB 12.7 02/25/2022 11:12 PM    HCT 40.0 02/25/2022 11:12 PM    MCV 93.2 02/25/2022 11:12 PM    MCH 29.6 02/25/2022 11:12 PM    MCHC 31.8 02/25/2022 11:12 PM    RDW 15.6 02/25/2022 11:12 PM    PLT 359 02/25/2022 11:12 PM    MPV 8.8 02/25/2022 11:12 PM     CBC with Differential:    Lab Results   Component Value Date/Time    WBC 11.8 02/25/2022 11:12 PM    RBC 4.29 02/25/2022 11:12 PM    HGB 12.7 02/25/2022 11:12 PM    HCT 40.0 02/25/2022 11:12 PM    PLT 359 02/25/2022 11:12 PM    MCV 93.2 02/25/2022 11:12 PM    MCH 29.6 02/25/2022 11:12 PM    MCHC 31.8 02/25/2022 11:12 PM    RDW 15.6 02/25/2022 11:12 PM    NRBC 0.0 02/25/2022 11:12 PM    NRBC 0.00 02/25/2022 11:12 PM    LYMPHOPCT 22 11/21/2021 05:11 AM    MONOPCT 6 11/21/2021 05:11 AM    MYELOPCT 1 11/20/2021 04:50 AM    BASOPCT 0 11/21/2021 05:11 AM    MONOSABS 0.9 11/21/2021 05:11 AM    LYMPHSABS 3.1 11/21/2021 05:11 AM    EOSABS 0.6 11/21/2021 05:11 AM    BASOSABS 0.0 11/21/2021 05:11 AM    DIFFTYPE AUTOMATED 11/21/2021 05:11 AM     WBC:    Lab Results   Component Value Date/Time    WBC 11.8 02/25/2022 11:12 PM     Platelets:    Lab Results   Component Value Date/Time    PLT 359 02/25/2022 11:12 PM     Hemoglobin/Hematocrit:    Lab Results   Component Value Date/Time    HGB 12.7 02/25/2022 11:12 PM    HCT 40.0 02/25/2022 11:12 PM     CMP:    Lab Results   Component Value Date/Time    NA 139 02/26/2022 04:56 AM    K 3.6 02/26/2022 04:56 AM  CL 111 02/26/2022 04:56 AM    CO2 23 02/26/2022 04:56 AM     BUN 21 02/26/2022 04:56 AM    CREATININE 0.70 02/26/2022 04:56 AM    GFRAA 41 03/20/2020 08:16 AM    AGRATIO 0.9 02/25/2022 11:12 PM    AGRATIO 0.8 01/28/2021 08:50 AM    LABGLOM >60 02/26/2022 04:56 AM    GLUCOSE 112 02/26/2022 04:56 AM    PROT 7.0 02/25/2022 11:12 PM    LABALBU 3.2 02/26/2022 04:56 AM    CALCIUM 8.7 02/26/2022 04:56 AM    BILITOT 0.3 02/25/2022 11:12 PM    ALKPHOS 108 02/25/2022 11:12 PM    ALKPHOS 102 01/28/2021 08:50 AM    AST 18 02/25/2022 11:12 PM    ALT 17 02/25/2022 11:12 PM     BMP:    Lab Results   Component Value Date/Time    NA 139 02/26/2022 04:56 AM    K 3.6 02/26/2022 04:56 AM    CL 111 02/26/2022 04:56 AM    CO2 23 02/26/2022 04:56 AM    BUN 21 02/26/2022 04:56 AM    LABALBU 3.2 02/26/2022 04:56 AM    CREATININE 0.70 02/26/2022 04:56 AM    CALCIUM 8.7 02/26/2022 04:56 AM    GFRAA 41 03/20/2020 08:16 AM    LABGLOM >60 02/26/2022 04:56 AM    GLUCOSE 112 02/26/2022 04:56 AM       IMPRESSION/RECOMMENDATIONS:    Assessment: Mildly displaced right intertrochanteric hip fracture secondary to ground-level fall    Plan: CT scan of the right hip will be obtained.    We will plan open reduction internal fixation of the right hip with a Synthes trochanteric femoral nail tomorrow.  I reviewed the surgical procedure with the patient in detail today.  All questions were answered.  Potential risks were discussed including, but not limited to, bleeding, infection, failure of the hardware, bone nonunion, possible need for further surgery, stroke, DVT and pulmonary embolism, MI, and the anesthetic complications will be discussed by the anesthesiologist.    N.p.o. after midnight tonight.    Ancef 2 g IV on-call to the operating room.    Thank you for this consult.    Lynett Grimes, MD, R.Ph., [facility]

## 2022-02-26 NOTE — Progress Notes (Signed)
4 Eyes Skin Assessment     NAME:  [name]  DATE OF BIRTH:  [birth year]  MEDICAL RECORD NUMBER:  [id number]    The patient is being assessed for  Admission    I agree that at least one RN has performed a thorough Head to Toe Skin Assessment on the patient. ALL assessment sites listed below have been assessed.      Areas assessed by both nurses:    Head, Face, Ears, Shoulders, Back, Chest, Arms, Elbows, Hands, Sacrum. Buttock, Coccyx, Ischium, and Legs. Feet and Heels        Does the Patient have a Wound? No noted wound(s) pt has no noted sacral wounds. Pt has minor redness noted on the sacrum. Zinc cream and foam placed . Pt skin coo, dry and intact        Braden Prevention initiated by RN: Yes  Wound Care Orders initiated by RN: No    Pressure Injury (Stage 3,4, Unstageable, DTI, NWPT, and Complex wounds) if present, place Wound referral order by RN under ORDER ENTRY: No    New Ostomies, if present place, Ostomy referral order under ORDER ENTRY: No     Nurse 1 eSignature: Electronically signed by Glenis Smoker, RN on 02/26/22 at 6:09 AM EST    **SHARE this note so that the co-signing nurse can place an eSignature**    Nurse 2 eSignature: Electronically signed by Janalyn Rouse, RN on 02/26/22 at 8:16 AM EST

## 2022-02-26 NOTE — Progress Notes (Signed)
Hospitalist Progress Note    NAME:   [name]   DOB: 12-29-1945   MRN: [id number]     Date/Time: 02/26/2022 12:22 PM  Patient PCP: Rigoberto Noel, APRN - NP    Estimated discharge date: 48 hours  Barriers: Surgery, orthopedic surgery clearance, CT head, echo, carotid duplex    Hospital Course:  [name] is  77 y.o. female with COPD, hyperlipidemia who was admitted 02/25/22 after a fall at home due to dizziness. X-ray right hip and pelvis show acute nondisplaced right intertrochanteric femur fracture, CT hip pending.  Ortho consulted, with plans to operate tomorrow.  CT head pending, echo pending, duplex carotids pending.    Assessment / Plan:  Acute nondisplaced right intertrochanteric femur fracture  -X-ray right hip and pelvis show acute nondisplaced right intertrochanteric femur fracture, CT hip pending  - Pain control  - N.p.o. at midnight  - Ortho consulted, will operate tomorrow    Dizziness  -CT head pending  - Echo pending  - Vascular duplex carotids pending  - Lipid panel and A1c ordered  - Continue atorvastatin and aspirin    GERD  - Continue Protonix  COPD  - Continue albuterol as needed    Hyperlipidemia  - Continue atorvastatin    Medical Decision Making:   I personally reviewed labs: BMP, CBC.  I personally reviewed imaging: X-ray hip, right.  X-ray right wrist  Toxic drug monitoring: Lovenox for bleeding, monitor H&H  Discussed case with: Patient, nurse, Dr. Jaci Standard        Code Status: Full  DVT Prophylaxis: Lovenox  GI Prophylaxis: Protonix    Subjective:     Chief Complaint / Reason for Physician Visit  Patient examined at bedside.  Patient describes her fall occurring after she stood up too quickly began feeling dizzy.  She did not hit her head.  She states over the last few weeks she has had frequent dizziness.  Her right leg hurts, no other pain..  Discussed with RN events overnight.       Objective:     VITALS:   Last 24hrs VS reviewed since prior progress note. Most recent are:  Patient  Vitals for the past 24 hrs:   BP Temp Temp src Pulse Resp SpO2 Height Weight   02/26/22 0933 115/83 98.1 F (36.7 C) Oral (!) 107 16 92 % -- --   02/26/22 0436 (!) 164/96 98.1 F (36.7 C) Oral 89 15 95 % -- --   02/26/22 0245 (!) 176/96 -- -- 85 22 91 % -- --   02/26/22 0200 (!) 167/85 -- -- 77 22 90 % -- --   02/26/22 0130 (!) 174/91 -- -- 74 14 -- -- --   02/26/22 0030 (!) 142/82 -- -- 73 18 -- -- --   02/26/22 0000 137/87 -- -- 74 16 92 % -- --   02/25/22 2330 -- -- -- 71 19 95 % -- --   02/25/22 2315 -- -- -- 76 15 93 % -- --   02/25/22 2307 (!) 152/85 98.1 F (36.7 C) Oral 75 19 94 % 1.6 m (5' 3"$ ) 42.6 kg (94 lb)       No intake or output data in the 24 hours ending 02/26/22 1222     I had a face to face encounter and independently examined this patient on 02/26/2022, as outlined below:    Review of Systems   Constitutional:  Negative for diaphoresis, fatigue and fever.   Respiratory:  Negative  for cough and shortness of breath.    Cardiovascular:  Negative for chest pain and leg swelling.   Gastrointestinal:  Negative for abdominal pain.   Genitourinary:  Negative for dysuria.   Musculoskeletal:         Right leg pain     Neurological:  Positive for dizziness. Negative for facial asymmetry, speech difficulty, weakness, light-headedness and headaches.   Psychiatric/Behavioral:  Negative for confusion.         PHYSICAL EXAM:  Physical Exam  Constitutional:       Appearance: She is not ill-appearing.   Eyes:      Extraocular Movements: Extraocular movements intact.   Cardiovascular:      Rate and Rhythm: Normal rate and regular rhythm.   Pulmonary:      Effort: Pulmonary effort is normal.      Breath sounds: Normal breath sounds.   Abdominal:      Palpations: Abdomen is soft.      Tenderness: There is no abdominal tenderness.   Musculoskeletal:      Comments: Dorsalis pedis 2+ bilaterally  Tenderness to palpation right leg and thigh  No edema or erythema   Neurological:      General: No focal deficit present.       Mental Status: She is alert and oriented to person, place, and time.      Sensory: No sensory deficit.          Reviewed most current lab test results and cultures  YES  Reviewed most current radiology test results   YES  Review and summation of old records today    NO  Reviewed patient's current orders and MAR    YES  PMH/SH reviewed - no change compared to H&P  ________________________________________________________________________  Care Plan discussed with:    Comments   Patient x    Family      RN x    Care Manager     Consultant                        Multidiciplinary team rounds were held today with case manager, nursing, pharmacist and Occupational psychologist.  Patient's plan of care was discussed; medications were reviewed and discharge planning was addressed.     ________________________________________________________________________  Total NON critical care TIME:  35  Minutes    Total CRITICAL CARE TIME Spent:   Minutes non procedure based      Comments   >50% of visit spent in counseling and coordination of care     ________________________________________________________________________  Durwin Reges, PA-C     Procedures: see electronic medical records for all procedures/Xrays and details which were not copied into this note but were reviewed prior to creation of Plan.      LABS:  I reviewed today's most current labs and imaging studies.  Pertinent labs include:  Recent Labs     02/25/22  2312   WBC 11.8*   HGB 12.7   HCT 40.0   PLT 359     Recent Labs     02/25/22  2312 02/26/22  0456   NA 138 139   K 4.3 3.6   CL 108 111*   CO2 28 23   BUN 27* 21*   PHOS  --  2.6   ALT 17  --    INR 1.0  --        Signed: Durwin Reges, PA-C

## 2022-02-27 ENCOUNTER — Inpatient Hospital Stay: Admit: 2022-02-27 | Payer: MEDICARE | Primary: Family

## 2022-02-27 ENCOUNTER — Inpatient Hospital Stay: Payer: MEDICARE | Primary: Family

## 2022-02-27 LAB — RENAL FUNCTION PANEL
Albumin: 2.8 g/dL — ABNORMAL LOW (ref 3.5–5.0)
Anion Gap: 8 mmol/L (ref 5–15)
BUN/Creatinine Ratio: 14 (ref 12–20)
BUN: 11 mg/dL (ref 6–20)
CO2: 21 mmol/L (ref 21–32)
Calcium: 8.6 mg/dL (ref 8.5–10.1)
Chloride: 108 mmol/L (ref 97–108)
Creatinine: 0.76 mg/dL (ref 0.55–1.02)
Est, Glom Filt Rate: >60 mL/min/{1.73_m2} (ref >60)
Glucose: 132 mg/dL — ABNORMAL HIGH (ref 65–100)
Phosphorus: 3.3 mg/dL (ref 2.6–4.7)
Potassium: 3.7 mmol/L (ref 3.5–5.1)
Sodium: 137 mmol/L (ref 136–145)

## 2022-02-27 LAB — LIPID PANEL
Chol/HDL Ratio: 2.2 (ref 0.0–5.0)
Cholesterol, Total: 129 mg/dL (ref <200)
HDL: 60 mg/dL
LDL Calculated: 56.4 mg/dL (ref 0–100)
Triglycerides: 63 mg/dL (ref <150)
VLDL Cholesterol Calculated: 12.6 mg/dL

## 2022-02-27 LAB — CBC WITH AUTO DIFFERENTIAL
Basophils %: 0 % (ref 0–1)
Basophils Absolute: 0 10*3/uL (ref 0.0–0.1)
Eosinophils %: 0 % (ref 0–7)
Eosinophils Absolute: 0.1 10*3/uL (ref 0.0–0.4)
Hematocrit: 39.8 % (ref 35.0–47.0)
Hemoglobin: 12.4 g/dL (ref 11.5–16.0)
Immature Granulocytes %: 1 % — ABNORMAL HIGH (ref 0–0.5)
Immature Granulocytes Absolute: 0.1 10*3/uL — ABNORMAL HIGH (ref 0.00–0.04)
Lymphocytes %: 5 % — ABNORMAL LOW (ref 12–49)
Lymphocytes Absolute: 0.8 10*3/uL (ref 0.8–3.5)
MCH: 29.4 PG (ref 26.0–34.0)
MCHC: 31.2 g/dL (ref 30.0–36.5)
MCV: 94.3 FL (ref 80.0–99.0)
MPV: 9.2 FL (ref 8.9–12.9)
Monocytes %: 3 % — ABNORMAL LOW (ref 5–13)
Monocytes Absolute: 0.5 10*3/uL (ref 0.0–1.0)
Neutrophils %: 91 % — ABNORMAL HIGH (ref 32–75)
Neutrophils Absolute: 14.6 10*3/uL — ABNORMAL HIGH (ref 1.8–8.0)
Nucleated RBCs: 0 PER 100 WBC
Platelets: 312 10*3/uL (ref 150–400)
RBC: 4.22 M/uL (ref 3.80–5.20)
RDW: 15.3 % — ABNORMAL HIGH (ref 11.5–14.5)
WBC: 16.1 10*3/uL — ABNORMAL HIGH (ref 3.6–11.0)
nRBC: 0 10*3/uL (ref 0.00–0.01)

## 2022-02-27 LAB — PROCALCITONIN: Procalcitonin: <0.05 ng/mL — ABNORMAL HIGH

## 2022-02-27 LAB — HEMOGLOBIN A1C
Estimated Avg Glucose: 114 mg/dL
Hemoglobin A1C: 5.6 % (ref 4.0–5.6)

## 2022-02-27 MED ORDER — ROCURONIUM BROMIDE 50 MG/5ML IV SOLN
50 MG/5ML | INTRAVENOUS | Status: DC | PRN
  Administered 2022-02-27: 15:00:00 30 via INTRAVENOUS

## 2022-02-27 MED ORDER — STERILE WATER FOR INJECTION (MIXTURES ONLY)
1 | INTRAMUSCULAR | Status: AC
Start: 2022-02-27 — End: 2022-02-27
  Administered 2022-02-27: 15:00:00 1000 mg via INTRAVENOUS

## 2022-02-27 MED ORDER — BUPIVACAINE HCL (PF) 0.5 % IJ SOLN
0.5 | INTRAMUSCULAR | Status: AC
Start: 2022-02-27 — End: ?

## 2022-02-27 MED ORDER — PROPOFOL 200 MG/20ML IV EMUL
200 | INTRAVENOUS | Status: AC
Start: 2022-02-27 — End: ?

## 2022-02-27 MED ORDER — FENTANYL CITRATE (PF) 100 MCG/2ML IJ SOLN
100 MCG/2ML | INTRAMUSCULAR | Status: DC | PRN
  Administered 2022-02-27 (×2): 25 via INTRAVENOUS

## 2022-02-27 MED ORDER — SODIUM CHLORIDE 0.9 % IV SOLN
0.9 | INTRAVENOUS | Status: DC | PRN
Start: 2022-02-27 — End: 2022-02-27

## 2022-02-27 MED ORDER — PROPOFOL 200 MG/20ML IV EMUL
200 MG/20ML | INTRAVENOUS | Status: DC | PRN
  Administered 2022-02-27 (×3): 50 via INTRAVENOUS

## 2022-02-27 MED ORDER — DEXTROSE 10 % IV SOLN
10 | INTRAVENOUS | Status: DC | PRN
Start: 2022-02-27 — End: 2022-02-27

## 2022-02-27 MED ORDER — SUGAMMADEX SODIUM 200 MG/2ML IV SOLN
200 MG/2ML | INTRAVENOUS | Status: DC | PRN
  Administered 2022-02-27: 16:00:00 100 via INTRAVENOUS

## 2022-02-27 MED ORDER — GLUCOSE 4 G PO CHEW
4 | ORAL | Status: DC | PRN
Start: 2022-02-27 — End: 2022-02-27

## 2022-02-27 MED ORDER — PHENYLEPHRINE HCL (PRESSORS) 0.4 MG/10ML IV SOSY
0.4 MG/10ML | INTRAVENOUS | Status: DC | PRN
  Administered 2022-02-27: 16:00:00 200 via INTRAVENOUS
  Administered 2022-02-27 (×2): 100 via INTRAVENOUS
  Administered 2022-02-27: 15:00:00 200 via INTRAVENOUS
  Administered 2022-02-27 (×2): 100 via INTRAVENOUS

## 2022-02-27 MED ORDER — ROCURONIUM BROMIDE 50 MG/5ML IV SOLN
50 | INTRAVENOUS | Status: AC
Start: 2022-02-27 — End: ?

## 2022-02-27 MED ORDER — LACTATED RINGERS IV SOLN
Freq: Once | INTRAVENOUS | Status: DC
Start: 2022-02-27 — End: 2022-02-27

## 2022-02-27 MED ORDER — FENTANYL CITRATE PF 50 MCG/ML IJ SOSY
50 | INTRAMUSCULAR | Status: DC | PRN
Start: 2022-02-27 — End: 2022-02-27

## 2022-02-27 MED ORDER — ONDANSETRON HCL 4 MG/2ML IJ SOLN
4 MG/2ML | INTRAMUSCULAR | Status: DC | PRN
  Administered 2022-02-27: 15:00:00 4 via INTRAVENOUS

## 2022-02-27 MED ORDER — LACTATED RINGERS IV SOLN
INTRAVENOUS | Status: DC | PRN
  Administered 2022-02-27: 15:00:00 via INTRAVENOUS

## 2022-02-27 MED ORDER — CEFAZOLIN SODIUM 1 G IJ SOLR
1 | Freq: Three times a day (TID) | INTRAMUSCULAR | Status: AC
Start: 2022-02-27 — End: 2022-02-28
  Administered 2022-02-27 – 2022-02-28 (×2): 1000 mg via INTRAVENOUS

## 2022-02-27 MED ORDER — LIDOCAINE 4 % EX PTCH
4 | CUTANEOUS | Status: DC | PRN
Start: 2022-02-27 — End: 2022-02-27

## 2022-02-27 MED ORDER — GLUCAGON HCL (DIAGNOSTIC) 1 MG IJ SOLR
1 | INTRAMUSCULAR | Status: DC | PRN
Start: 2022-02-27 — End: 2022-02-27

## 2022-02-27 MED ORDER — EPHEDRINE SULFATE (PRESSORS) 50 MG/ML IV SOLN
50 | INTRAVENOUS | Status: AC
Start: 2022-02-27 — End: ?

## 2022-02-27 MED ORDER — ONDANSETRON HCL 4 MG/2ML IJ SOLN
4 | INTRAMUSCULAR | Status: AC
Start: 2022-02-27 — End: ?

## 2022-02-27 MED ORDER — KETOROLAC TROMETHAMINE 30 MG/ML IJ SOLN
30 | INTRAMUSCULAR | Status: AC
Start: 2022-02-27 — End: ?

## 2022-02-27 MED ORDER — DEXTROSE 10 % IV BOLUS
INTRAVENOUS | Status: DC | PRN
Start: 2022-02-27 — End: 2022-02-27

## 2022-02-27 MED ORDER — FENTANYL CITRATE (PF) 100 MCG/2ML IJ SOLN
100 | INTRAMUSCULAR | Status: AC
Start: 2022-02-27 — End: ?

## 2022-02-27 MED ORDER — BUPIVACAINE HCL 0.5 % IJ SOLN (MIXTURES ONLY)
0.5 | INTRAMUSCULAR | Status: DC | PRN
Start: 2022-02-27 — End: 2022-02-27
  Administered 2022-02-27: 16:00:00 17 via INTRADERMAL

## 2022-02-27 MED ORDER — TRANEXAMIC ACID 1000 MG/10ML IV SOLN
1000 | INTRAVENOUS | Status: AC
Start: 2022-02-27 — End: ?

## 2022-02-27 MED ORDER — EPHEDRINE SULFATE (PRESSORS) 50 MG/ML IV SOLN
50 MG/ML | INTRAVENOUS | Status: DC | PRN
  Administered 2022-02-27 (×3): 5 via INTRAVENOUS

## 2022-02-27 MED ORDER — TRAMADOL HCL 50 MG PO TABS
50 | ORAL | Status: DC | PRN
Start: 2022-02-27 — End: 2022-02-27

## 2022-02-27 MED ORDER — LIDOCAINE-EPINEPHRINE 1 %-1:100000 IJ SOLN
1 | INTRAMUSCULAR | Status: AC
Start: 2022-02-27 — End: ?

## 2022-02-27 MED ORDER — DIPHENHYDRAMINE HCL 50 MG/ML IJ SOLN
50 | Freq: Once | INTRAMUSCULAR | Status: DC | PRN
Start: 2022-02-27 — End: 2022-02-27

## 2022-02-27 MED ORDER — SUGAMMADEX SODIUM 200 MG/2ML IV SOLN
200 | INTRAVENOUS | Status: AC
Start: 2022-02-27 — End: ?

## 2022-02-27 MED ORDER — ONDANSETRON HCL 4 MG/2ML IJ SOLN
4 | Freq: Once | INTRAMUSCULAR | Status: DC | PRN
Start: 2022-02-27 — End: 2022-02-27

## 2022-02-27 MED ORDER — PHENYLEPHRINE HCL 1 MG/10ML IV SOSY
1 | INTRAVENOUS | Status: AC
Start: 2022-02-27 — End: ?

## 2022-02-27 MED ORDER — LIDOCAINE HCL (PF) 2 % IJ SOLN
2 % | INTRAMUSCULAR | Status: DC | PRN
  Administered 2022-02-27: 15:00:00 60 via INTRAVENOUS

## 2022-02-27 MED ORDER — NORMAL SALINE FLUSH 0.9 % IV SOLN
0.9 | Freq: Two times a day (BID) | INTRAVENOUS | Status: DC
Start: 2022-02-27 — End: 2022-02-27

## 2022-02-27 MED ORDER — LIDOCAINE HCL (PF) 2 % IJ SOLN
2 | INTRAMUSCULAR | Status: AC
Start: 2022-02-27 — End: ?

## 2022-02-27 MED ORDER — HYDRALAZINE HCL 20 MG/ML IJ SOLN
20 | INTRAMUSCULAR | Status: DC | PRN
Start: 2022-02-27 — End: 2022-02-27

## 2022-02-27 MED ORDER — ASPIRIN 81 MG PO CHEW
81 MG | Freq: Every day | ORAL | Status: DC
Start: 2022-02-27 — End: 2022-03-02
  Administered 2022-02-27 – 2022-03-02 (×4): 324 mg via ORAL

## 2022-02-27 MED ORDER — SODIUM CHLORIDE 0.9 % IV SOLN (MINI-BAG)
0.9 | Freq: Once | INTRAVENOUS | Status: DC
Start: 2022-02-27 — End: 2022-03-02

## 2022-02-27 MED ORDER — DEXAMETHASONE 4 MG/ML IJ SOLN (MIXTURES ONLY)
4 MG/ML | INTRAMUSCULAR | Status: DC | PRN
  Administered 2022-02-27: 15:00:00 4 via INTRAVENOUS

## 2022-02-27 MED ORDER — HYDROMORPHONE HCL PF 1 MG/ML IJ SOLN
1 | INTRAMUSCULAR | Status: DC | PRN
Start: 2022-02-27 — End: 2022-02-27

## 2022-02-27 MED ORDER — STERILE WATER FOR INJECTION IJ SOLN
INTRAMUSCULAR | Status: AC
Start: 2022-02-27 — End: 2022-02-27

## 2022-02-27 MED ORDER — NORMAL SALINE FLUSH 0.9 % IV SOLN
0.9 | INTRAVENOUS | Status: DC | PRN
Start: 2022-02-27 — End: 2022-02-27

## 2022-02-27 MED ORDER — IPRATROPIUM-ALBUTEROL 0.5-2.5 (3) MG/3ML IN SOLN
Freq: Once | RESPIRATORY_TRACT | Status: DC | PRN
Start: 2022-02-27 — End: 2022-02-27

## 2022-02-27 MED ORDER — LIDOCAINE-EPINEPHRINE 1 %-1:100000 IJ SOLN
1 | INTRAMUSCULAR | Status: DC | PRN
Start: 2022-02-27 — End: 2022-02-27
  Administered 2022-02-27: 16:00:00 17 via INTRADERMAL

## 2022-02-27 MED ORDER — LABETALOL HCL 5 MG/ML IV SOLN
5 | INTRAVENOUS | Status: DC | PRN
Start: 2022-02-27 — End: 2022-02-27

## 2022-02-27 MED ORDER — KETOROLAC TROMETHAMINE 30 MG/ML IJ SOLN
30 MG/ML | INTRAMUSCULAR | Status: DC | PRN
  Administered 2022-02-27: 16:00:00 15 via INTRAVENOUS

## 2022-02-27 MED ORDER — DEXTROSE 5 % IV SOLN
5 % | INTRAVENOUS | Status: DC | PRN
Start: 2022-02-27 — End: 2022-02-27
  Administered 2022-02-27: 15:00:00 1000 via INTRAVENOUS

## 2022-02-27 MED ORDER — CEFAZOLIN SODIUM 1 G IJ SOLR
1 | INTRAMUSCULAR | Status: AC
Start: 2022-02-27 — End: 2022-02-27

## 2022-02-27 MED ORDER — LIDOCAINE HCL 1 % IJ SOLN
1 | INTRAMUSCULAR | Status: AC
Start: 2022-02-27 — End: ?

## 2022-02-27 MED ORDER — HYDRALAZINE HCL 20 MG/ML IJ SOLN
20 MG/ML | INTRAMUSCULAR | Status: DC | PRN
Start: 2022-02-27 — End: 2022-03-02
  Administered 2022-02-27: 12:00:00 10 mg via INTRAVENOUS

## 2022-02-27 MED FILL — BRIDION 200 MG/2ML IV SOLN: 200 MG/2ML | INTRAVENOUS | Qty: 2

## 2022-02-27 MED FILL — CEFAZOLIN SODIUM 1 G IJ SOLR: 1 g | INTRAMUSCULAR | Qty: 2000

## 2022-02-27 MED FILL — TRANEXAMIC ACID 1000 MG/10ML IV SOLN: 1000 MG/10ML | INTRAVENOUS | Qty: 10

## 2022-02-27 MED FILL — ASPIRIN LOW STRENGTH 81 MG PO CHEW: 81 MG | ORAL | Qty: 4

## 2022-02-27 MED FILL — MIRTAZAPINE 30 MG PO TABS: 30 MG | ORAL | Qty: 1

## 2022-02-27 MED FILL — XYLOCAINE-MPF 2 % IJ SOLN: 2 % | INTRAMUSCULAR | Qty: 5

## 2022-02-27 MED FILL — XYLOCAINE/EPINEPHRINE 1 %-1:100000 IJ SOLN: 1 %-:00000 | INTRAMUSCULAR | Qty: 20

## 2022-02-27 MED FILL — STERILE WATER FOR INJECTION IJ SOLN: INTRAMUSCULAR | Qty: 20

## 2022-02-27 MED FILL — LACTATED RINGERS IV SOLN: INTRAVENOUS | Qty: 1000

## 2022-02-27 MED FILL — LIDOCAINE HCL 1 % IJ SOLN: 1 % | INTRAMUSCULAR | Qty: 20

## 2022-02-27 MED FILL — ONDANSETRON HCL 4 MG/2ML IJ SOLN: 4 MG/2ML | INTRAMUSCULAR | Qty: 2

## 2022-02-27 MED FILL — SENSORCAINE-MPF 0.5 % IJ SOLN: 0.5 % | INTRAMUSCULAR | Qty: 30

## 2022-02-27 MED FILL — PANTOPRAZOLE SODIUM 20 MG PO TBEC: 20 MG | ORAL | Qty: 1

## 2022-02-27 MED FILL — METOPROLOL SUCCINATE ER 50 MG PO TB24: 50 MG | ORAL | Qty: 1

## 2022-02-27 MED FILL — HYDRALAZINE HCL 20 MG/ML IJ SOLN: 20 MG/ML | INTRAMUSCULAR | Qty: 1

## 2022-02-27 MED FILL — KETOROLAC TROMETHAMINE 30 MG/ML IJ SOLN: 30 MG/ML | INTRAMUSCULAR | Qty: 1

## 2022-02-27 MED FILL — SODIUM CHLORIDE FLUSH 0.9 % IV SOLN: 0.9 % | INTRAVENOUS | Qty: 40

## 2022-02-27 MED FILL — PHENYLEPHRINE HCL (PRESSORS) 1 MG/10ML IV SOSY: 1 MG/0ML | INTRAVENOUS | Qty: 10

## 2022-02-27 MED FILL — PROPOFOL 200 MG/20ML IV EMUL: 200 MG/20ML | INTRAVENOUS | Qty: 20

## 2022-02-27 MED FILL — AKOVAZ 50 MG/ML IV SOLN: 50 MG/ML | INTRAVENOUS | Qty: 1

## 2022-02-27 MED FILL — CEFAZOLIN SODIUM 1 G IJ SOLR: 1 g | INTRAMUSCULAR | Qty: 1000

## 2022-02-27 MED FILL — ATORVASTATIN CALCIUM 40 MG PO TABS: 40 MG | ORAL | Qty: 1

## 2022-02-27 MED FILL — ROCURONIUM BROMIDE 50 MG/5ML IV SOLN: 50 MG/5ML | INTRAVENOUS | Qty: 5

## 2022-02-27 MED FILL — FENTANYL CITRATE (PF) 100 MCG/2ML IJ SOLN: 100 MCG/2ML | INTRAMUSCULAR | Qty: 2

## 2022-02-27 MED FILL — DEXTROSE 10 % IV SOLN: 10 % | INTRAVENOUS | Qty: 1000

## 2022-02-27 NOTE — Care Coordination-Inpatient (Signed)
Chart reviewed. ORIF of the right hip with a Synthes trochanteric femoral nail today.    DCP: Pending PT/OT evals when appropriate.     CM will continue to follow patient and recs of medical team.

## 2022-02-27 NOTE — Anesthesia Pre-Procedure Evaluation (Signed)
0Department of Anesthesiology  Preprocedure Note       Name:  [name]   Age:  77 y.o.  DOB:  1945/10/04                                          MRN:  [id number]         Date:  02/27/2022      Surgeon: Moishe Spice):  Muron, Elmon Else, MD    Procedure: Procedure(s):  OPEN REDUCTION INTERNAL FIXATION RIGHT HIP, SYNTHES SHORT TFN    Medications prior to admission:   Prior to Admission medications    Medication Sig Start Date End Date Taking? Authorizing Provider   metoprolol succinate (TOPROL XL) 25 MG extended release tablet Take 1 tablet by mouth daily 02/02/22  Yes [provider]   vitamin D (ERGOCALCIFEROL) 1.25 MG (50000 UT) CAPS capsule Take 1 capsule by mouth once a week 01/17/22  Yes [provider]   mirtazapine (REMERON) 30 MG tablet Take 1 tablet by mouth nightly 10/12/21   [provider]   atorvastatin (LIPITOR) 40 MG tablet Take 1 tablet by mouth daily 07/07/21   [provider]   aspirin 81 MG EC tablet Take 1 tablet by mouth daily 06/09/21   [name], MD   albuterol sulfate HFA (PROVENTIL;VENTOLIN;PROAIR) 108 (90 Base) MCG/ACT inhaler albuterol sulfate HFA 90 mcg/actuation aerosol inhaler   INHALE 2 PUFFS EVERY 6 HOURS AS NEEDED    Automatic Reconciliation, Ar   omeprazole (PRILOSEC) 20 MG delayed release capsule Take 1 capsule by mouth daily 08/11/20   Automatic Reconciliation, Ar       Current medications:    Current Facility-Administered Medications   Medication Dose Route Frequency Provider Last Rate Last Admin    hydrALAZINE (APRESOLINE) injection 10 mg  10 mg IntraVENous Q4H PRN Nancie Neas, MD   10 mg at 02/27/22 0640    sterile water injection             ceFAZolin (ANCEF) 1 g injection             ceFAZolin (ANCEF) 2,000 mg in sterile water 20 mL IV syringe  2,000 mg IntraVENous On Call to OR Muron, Elmon Else, MD        tranexamic acid (CYKLOKAPRON) 1,000 mg in sodium chloride 0.9 % 110 mL IVPB (mini-bag)  1,000 mg IntraVENous Once Murlean Iba,  MD        albuterol sulfate HFA (PROVENTIL;VENTOLIN;PROAIR) 108 (90 Base) MCG/ACT inhaler 2 puff  2 puff Inhalation Q4H PRN [name], MD        [Held by provider] aspirin EC tablet 81 mg  81 mg Oral Daily [name], MD   81 mg at 02/26/22 0824    atorvastatin (LIPITOR) tablet 40 mg  40 mg Oral Daily [name], MD   40 mg at 02/27/22 0813    mirtazapine (REMERON) tablet 30 mg  30 mg Oral QHS [name], MD   30 mg at 02/26/22 2033    metoprolol succinate (TOPROL XL) extended release tablet 25 mg  25 mg Oral Daily [name], MD   25 mg at 02/27/22 0813    pantoprazole (PROTONIX) tablet 20 mg  20 mg Oral QAM AC [name], MD   20 mg at 02/27/22 0640    sodium chloride flush 0.9 % injection 5-40 mL  5-40 mL IntraVENous  2 times per day [name], MD   10 mL at 02/27/22 0814    sodium chloride flush 0.9 % injection 5-40 mL  5-40 mL IntraVENous PRN [name], MD        0.9 % sodium chloride infusion   IntraVENous PRN [name], MD        [facility] by provider] enoxaparin Sodium (LOVENOX) injection 30 mg  30 mg SubCUTAneous Daily Celso Sickle, Sakuntla, MD        ondansetron (ZOFRAN-ODT) disintegrating tablet 4 mg  4 mg Oral Q8H PRN [name], MD        Or    ondansetron [facility]) injection 4 mg  4 mg IntraVENous Q6H PRN [name], MD   4 mg at 02/26/22 0454    acetaminophen (TYLENOL) tablet 650 mg  650 mg Oral Q6H PRN [name], MD   650 mg at 02/26/22 0453    Or    acetaminophen (TYLENOL) suppository 650 mg  650 mg Rectal Q6H PRN [name], MD        oxyCODONE-acetaminophen (PERCOCET) 5-325 MG per tablet 1 tablet  1 tablet Oral Q4H PRN Lackey, Richelle, PA-C        HYDROmorphone HCl PF (DILAUDID) injection 0.5 mg  0.5 mg IntraVENous Q4H PRN Lackey, Richelle, PA-C           Allergies:    Allergies   Allergen Reactions    Cheese Nausea And Vomiting     Severe vomiting  Severe  vomiting  Severe vomiting       Problem List:    Patient Active Problem List   Diagnosis Code    Cardiac syncope R55    Ambulatory dysfunction R26.2    Sepsis (HCC) A41.9    Hip fracture (HCC) S72.009A    Closed fracture of neck of left femur (HCC) S72.002A    Femur fracture (HCC) S72.90XA    Colitis K52.9    AKI (acute kidney injury) (HCC) N17.9    Diarrhea R19.7    Hypokalemia E87.6    C. difficile colitis A04.72    Intertrochanteric fracture of right femur, closed, initial encounter (HCC) S72.141A    Dizziness R42    Fracture, intertrochanteric, right femur, closed, initial encounter (HCC) S72.141A       Past Medical History:        Diagnosis Date    Chronic obstructive pulmonary disease (HCC)     Hypercholesteremia     Insomnia     Sleep disorder        Past Surgical History:        Procedure Laterality Date    CT GUIDED CHEST TUBE  12/06/2018    CT GUIDED CHEST TUBE    ORTHOPEDIC SURGERY  05/26/2021    LEFT HIP REVISION POSTERIOR APPROACH WITH ALLOGRAFT    PARTIAL HYSTERECTOMY (CERVIX NOT REMOVED)      SHOULDER SURGERY Left        Social History:    Social History     Tobacco Use    Smoking status: Former     Current packs/day: 0.00     Types: Cigarettes     Quit date: 01/24/2019     Years since quitting: 3.0    Smokeless tobacco: Never   Substance Use Topics    Alcohol use: Yes     Alcohol/week: 1.0 standard drink of alcohol  Counseling given: Not Answered      Vital Signs (Current):   Vitals:    02/26/22 2020 02/27/22 0326 02/27/22 0800 02/27/22 0830   BP: (!) 157/89 (!) 164/81 (!) 155/85 136/76   Pulse: (!) 102 95 (!) 112 (!) 122   Resp: 18 16 18 16    Temp: 98.8 F (37.1 C) 97.9 F (36.6 C) 97.4 F (36.3 C) 98 F (36.7 C)   TempSrc: Oral Axillary Oral Oral   SpO2: (!) 89% 99% 94% 95%   Weight:       Height:                                                  BP Readings from Last 3 Encounters:   02/27/22 136/76   11/21/21 124/76   10/21/21 (!) 143/81       NPO Status:                                                                                  BMI:   Wt Readings from Last 3 Encounters:   02/25/22 42.6 kg (94 lb)   11/14/21 40.8 kg (90 lb)   10/20/21 41.7 kg (91 lb 14 oz)     Body mass index is 16.65 kg/m.    CBC:   Lab Results   Component Value Date/Time    WBC 11.8 02/25/2022 11:12 PM    RBC 4.29 02/25/2022 11:12 PM    HGB 12.7 02/25/2022 11:12 PM    HCT 40.0 02/25/2022 11:12 PM    MCV 93.2 02/25/2022 11:12 PM    RDW 15.6 02/25/2022 11:12 PM    PLT 359 02/25/2022 11:12 PM       CMP:   Lab Results   Component Value Date/Time    NA 139 02/26/2022 04:56 AM    K 3.6 02/26/2022 04:56 AM    CL 111 02/26/2022 04:56 AM    CO2 23 02/26/2022 04:56 AM    BUN 21 02/26/2022 04:56 AM    CREATININE 0.70 02/26/2022 04:56 AM    GFRAA 41 03/20/2020 08:16 AM    AGRATIO 0.9 02/25/2022 11:12 PM    AGRATIO 0.8 01/28/2021 08:50 AM    LABGLOM >60 02/26/2022 04:56 AM    GLUCOSE 112 02/26/2022 04:56 AM    PROT 7.0 02/25/2022 11:12 PM    CALCIUM 8.7 02/26/2022 04:56 AM    BILITOT 0.3 02/25/2022 11:12 PM    ALKPHOS 108 02/25/2022 11:12 PM    ALKPHOS 102 01/28/2021 08:50 AM    AST 18 02/25/2022 11:12 PM    ALT 17 02/25/2022 11:12 PM       POC Tests: No results for input(s): "POCGLU", "POCNA", "POCK", "POCCL", "POCBUN", "POCHEMO", "POCHCT" in the last 72 hours.    Coags:   Lab Results   Component Value Date/Time    PROTIME 14.1 02/25/2022 11:12 PM    INR 1.0 02/25/2022 11:12 PM    APTT 27.4 05/24/2021 06:51 PM       HCG (If Applicable): No results found for: "PREGTESTUR", "  PREGSERUM", "HCG", "HCGQUANT"     ABGs: No results found for: "PHART", "PO2ART", "PCO2ART", "HCO3ART", "BEART", "O2SATART"     Type & Screen (If Applicable):  No results found for: "LABABO", "[place]"    Drug/Infectious Status (If Applicable):  No results found for: "HIV", "HEPCAB"    COVID-19 Screening (If Applicable):   Lab Results   Component Value Date/Time    COVID19 DETECTED 01/25/2021 05:47 PM           Anesthesia Evaluation  Patient summary  reviewed and Nursing notes reviewed   no history of anesthetic complications:   Airway: Mallampati: II  TM distance: >3 FB   Neck ROM: full  Mouth opening: > = 3 FB   Dental:    (+) edentulous      Pulmonary:normal exam  breath sounds clear to auscultation  (+)  COPD:                                    ROS comment: Former 1 ppd smoker for a long time. Patient quit smoking approximately 6 months ago    Cardiovascular:    (+) hypertension:, hyperlipidemia      ECG reviewed  Rhythm: regular  Rate: normal  Echocardiogram reviewed               ROS comment: 02/25/2022 EKG:  Normal sinus rhythm  Normal ECG  When compared with ECG of 25-Feb-2022 22:58, (Unconfirmed)  No significant change was found  Confirmed by Broadus John MD, MATHEW ([id number]) on 02/26/2022 8:57:42 AM      11/30/2020 TTE:  Interpretation Summary    This is a summary report. The complete report is available in the patient's medical record. If you cannot access the medical record, please contact the sending organization for a detailed fax or copy.       Left Ventricle: Low normal left ventricular systolic function with a visually estimated EF of 50 - 55%. Left ventricle is smaller than normal. Mild septal thickening. Mild posterior thickening. See diagram for wall motion findings. Grade I diastolic dysfunction with normal LAP.    Right Ventricle: Reduced systolic function.    Aortic Valve: Tricuspid valve. Mild sclerosis of the aortic valve cusp. Mild regurgitation.    Mitral Valve: Valve structure is normal. Mild annular calcification at the posterior leaflet of the mitral valve. Mild to moderate paravalvular regurgitation.    Right Atrium: Right atrium is mildly dilated.    Technical qualifiers: Echo study was technically difficult due to patient's body habitus.           Neuro/Psych:   (+) dementia             ROS comment: 02/26/22 Vascular Duplex Carotids:  Impression:  1.  Bilateral subclavian arteries are patent.  2.  Bilateral common carotid arteries are  patent.  3.  Right ECA shows focal minimal echodense plaque.  4.  Right proximal ICA shows moderate amount of irregular mixed plaque with elevated plaque.  Peak systolic velocity however is within normal limits.  On grayscale imaging plaque burden is causing about 40 to 50% luminal compromise.  5.  Left ECA shows tortuous vessel with  moderate amount of echodense plaque.  6.  Left proximal ICA shows moderate amount of irregular diffuse heterogeneous plaque.  Peak systolic velocity is moderately elevated.  Per criteria 50 to 69% stenosis.  7.  Bilateral vertebral arteries patent and show antegrade flow.  GI/Hepatic/Renal:   (+) GERD:          Endo/Other:    (+) : arthritis: OA.Marland Kitchen                 Abdominal:              PE comment: Deferred.   Vascular: negative vascular ROS.         Other Findings:             Anesthesia Plan      general     ASA 3     (Standard ASA monitors: continuous EKG, BP, HR, pulse oximeter, temperature, and capnography.)  Induction: intravenous.  continuous noninvasive hemodynamic monitor  MIPS: Postoperative opioids intended and Prophylactic antiemetics administered.  Anesthetic plan and risks discussed with patient and child/children (Telephone consent for anesthesia obtained from patient's son [name]. DNR discussion also performed with patient's son [name]).      Plan discussed with CRNA.    Attending anesthesiologist reviewed and agrees with Preprocedure content                Donivan Scull., MD   02/27/2022

## 2022-02-27 NOTE — Op Note (Signed)
Operative Note      Patient: [name]  Date of Birth: 09/27/1945  MRN: [id number]    Date of Procedure: March 09, 2022    Pre-Op Diagnosis Codes:     * Closed displaced intertrochanteric fracture of right femur, initial encounter ([place]) [S72.141A]    Post-Op Diagnosis: Same       Procedure(s):  OPEN REDUCTION INTERNAL FIXATION RIGHT HIP, SYNTHES SHORT TFN; INTERTROCHANTERIC HIP FRACTURE    Surgeon(s):  Zakhari Fogel, Grayling Congress, MD    Assistant:   Surgical Assistant: Caryl Pina    Anesthesia: General    Estimated Blood Loss (mL): less than 50     Complications: None    Specimens:   * No specimens in log *    Implants:  Implant Name Type Inv. Item Serial No. Manufacturer Lot No. LRB No. Used Action   NAIL IM L170MM DIA9MM 130DEG SHT PROX FEM GRN TI CANN ANAT - [id number]  NAIL IM L170MM DIA9MM 130DEG SHT PROX FEM GRN TI CANN ANAT  DEPUY SYNTHES [employer]WD [id number] Right 1 Implanted   BLADE IM L90MM DIA10.35MM PROX FEM G TI CANN FEN HELI FOR - [id number]  BLADE IM L90MM DIA10.35MM PROX FEM G TI CANN FEN HELI FOR  DEPUY SYNTHES [employer]WD [id number] Right 1 Implanted   SCREW LCK 5X32MM W/T25 STRDRV F/IM NAIL STRL - [id number]  SCREW LCK 5X32MM W/T25 STRDRV F/IM NAIL STRL  DEPUY SYNTHES [employer]-WD NA Right 1 Implanted         Drains:   External Urinary Catheter (Active)   Site Assessment Clean,dry & intact 03/09/2022 0830       Findings: Displaced Right IT Hip Fracture, anatomic alignment was achieved with traction and slight internal rotation.        Detailed Description of Procedure:   The patient is a 77 year old female who sustained a fall at home after she got up out of bed going to the bathroom early yesterday morning.  She presented to [facility] and was complaining of right hip pain.  X-rays of the right hip showed a displaced intertrochanteric hip fracture confirmed by CT scan.  Recommendations are made to the patient and her son Mr. Dorien Chihuahua for open reduction internal fixation of this fracture using a Synthes  trochanteric femoral nail.  The potential risks and benefits were outlined with the patient and her son which includes but is not limited to, infection, bleeding, DVT and pulmonary embolism, stroke, MI, failure of the bone unite, possible need for further surgery, and chronic pain and leg length inequality's.  Anesthetic complications will be discussed by the anesthesiologist.  Consent was obtained from the son.  I spoke with him over the phone at phone number [phone] this morning.  The patient received 1 g of Ancef preoperatively within 1 hour surgery.  She was taken to the operating room and underwent general anesthesia in the supine position on the bed.  The patient was then transferred onto the operating room fracture table in the supine position where the right lower extremity was placed in longitudinal boot traction of the left lower extremity abducted.  Preliminary fluoroscopic imaging showed anatomic reduction of the fracture after manipulating the traction.    The right hip and leg were then fully prepped and draped in sterile fashion.  The patient received 1 g of TXA preoperatively.    A timeout was called to verify the patient's identity and right hip as the appropriate surgical site.  Anesthesia had no concerns and the patient remained hemodynamically  stable.  All Synthes equipment was accounted for on the back table, and the Synthes representative was present.    The proposed incision sites were injected with a combination of 1% lidocaine and 0.5% Marcaine.  A 1 inch incision was initiated just proximal to the greater trochanter with a #10 blade, and carried down through subcutaneous tissues to the fascia, which was sharply incised in line with the incision.  The abductors were bluntly dissected down to the trochanter.  A threaded tip guidepin was then passed down the canal at an anatomic starting point in the trochanter.  Alignment was checked under fluoroscopic imaging and the pin was noted to be  anatomically aligned.  The proximal trochanteric region was then reamed with a 16 mm reamer.  A preselected 9 mm x 130 degree angle Synthes TFN was then placed into the proximal femur over the guidepin.  The guidepin was then withdrawn.  The TFN was placed at an anatomic location.  The outrigger was applied and a small incision made laterally and the barrel placed up against the femur.  A threaded tip guidepin was then passed in the central portion of the femoral head and neck as judged under AP and lateral fluoroscopic imaging.  Depth measured 90 mm.  The outer cortex was drilled and a 90 mm spiral blade was passed up in the central portion of the femoral head and neck.  The proximal interlocking screw was then tightened.  Next using the AO technique, a small incision was made distally and a 32 mm interlocking screw was placed without difficulty.  Final fluoroscopic imaging revealed anatomic alignment of the fracture with all hardware in good position.    Wounds were thoroughly irrigated with saline solution.  Fascia was closed with 0 Vicryl suture, subcutaneous tissues reapproximated with 2-0 Vicryl suture, skin closed with a running 3-0 Monocryl suture proximally.  Distal stab incisions were closed with staples.  The incision sites were reinjected.  Mepilex dressings were applied.    The patient was removed from the fracture table, placed in the supine position on the bed, where she was extubated and taken to the PACU in stable condition.    There were no complications.    Lynett Grimes, Md, R.Ph, FAAOS  Electronically signed by Lynett Grimes, MD on 02/27/2022 at 11:45 AM

## 2022-02-27 NOTE — Progress Notes (Cosign Needed)
Hospitalist Progress Note    NAME:   [name]   DOB: 1945-12-02   MRN: [id number]     Date/Time: 02/27/2022 1:00 PM  Patient PCP: Vevelyn Francois, APRN - NP    Estimated discharge date: 48 hours  Barriers: Postop recovery, echo    Hospital Course:  [name] is  77 y.o. female with COPD, hyperlipidemia who was admitted 02/25/22 after a fall at home due to dizziness. X-ray right hip and pelvis show acute nondisplaced right intertrochanteric femur fracture, CT hip pending.  Ortho consulted, with plans to operate tomorrow.  CT head pending, echo pending, duplex carotids pending.  Vascular duplex carotid bilateral revealed 40 to 50% luminal compromise of right proximal ICA, 50 to 69% stenosis of left proximal ICA, however patent and antegrade flow, patient will need to follow-up with vascular surgery as an outpatient.  Orthopedics performed ORIF right hip, short TFN 2/5.    Assessment / Plan:  Acute nondisplaced right intertrochanteric femur fracture, s/p ORIF POD #0  -Continue pain control, PT/OT when able, ambulate, incentive spirometer  - Ortho following    Dizziness  -CT head negative  - Echo pending  - Vascular duplex carotids with moderate luminal compromise of left and right ICA, suspect likely cause of dizziness  - Will have patient follow-up as an outpatient with vascular surgery for regular monitoring  - Lipid panel and A1c normal  - Continue atorvastatin and aspirin    GERD  - Continue Protonix    COPD  - Continue albuterol as needed    Hyperlipidemia  - Continue atorvastatin    Hypotension  - BP 89/62 following surgery, will give 500 cc of fluid and reassess    Leukocytosis  - WBC 16.1  - Suspect reactive  - Monitor Pro-Cal  - Ancef    Medical Decision Making:   I personally reviewed labs: BMP, CBC, A1c, lipid panel  I personally reviewed imaging: CT head  Toxic drug monitoring: Lovenox for bleeding, monitor H&H  Discussed case with: Patient, nurse        Code Status: Full  DVT Prophylaxis: SCDs  GI  Prophylaxis: Protonix    Subjective:     Chief Complaint / Reason for Physician Visit  Patient seen in room.  She is disoriented following anesthesia from her procedure.  She is not in any acute distress.  She does report some mild right leg pain.      Objective:     VITALS:   Last 24hrs VS reviewed since prior progress note. Most recent are:  Patient Vitals for the past 24 hrs:   BP Temp Temp src Pulse Resp SpO2   02/27/22 1145 102/64 97.9 F (36.6 C) Axillary 92 20 96 %   02/27/22 1135 109/61 -- -- (!) 101 29 96 %   02/27/22 1130 106/63 -- Oral (!) 101 23 95 %   02/27/22 0830 136/76 98 F (36.7 C) Oral (!) 122 16 95 %   02/27/22 0800 (!) 155/85 97.4 F (36.3 C) Oral (!) 112 18 94 %   02/27/22 0326 (!) 164/81 97.9 F (36.6 C) Axillary 95 16 99 %   02/26/22 2020 (!) 157/89 98.8 F (37.1 C) Oral (!) 102 18 (!) 89 %   02/26/22 1456 118/84 98.4 F (36.9 C) -- (!) 109 14 91 %           Intake/Output Summary (Last 24 hours) at 02/27/2022 1300  Last data filed at 02/27/2022 1129  Gross per 24  hour   Intake 750 ml   Output 755 ml   Net -5 ml        I had a face to face encounter and independently examined this patient on 02/27/2022, as outlined below:    Review of Systems   Constitutional:  Negative for diaphoresis, fatigue and fever.   Respiratory:  Negative for cough and shortness of breath.    Cardiovascular:  Negative for chest pain and leg swelling.   Gastrointestinal:  Negative for abdominal pain.   Genitourinary:  Negative for dysuria.   Musculoskeletal:         Right leg pain     Neurological:  Negative for dizziness, facial asymmetry, speech difficulty, weakness, light-headedness and headaches.   Psychiatric/Behavioral:  Negative for confusion.         PHYSICAL EXAM:  Physical Exam  Constitutional:       Appearance: She is not ill-appearing.   Eyes:      Extraocular Movements: Extraocular movements intact.   Cardiovascular:      Rate and Rhythm: Normal rate and regular rhythm.   Pulmonary:      Effort: Pulmonary  effort is normal.      Breath sounds: Normal breath sounds.   Abdominal:      Palpations: Abdomen is soft.      Tenderness: There is no abdominal tenderness.   Musculoskeletal:      Comments: Surgical dressing clean dry intact  Dorsalis pedis 2+ bilaterally  Tenderness to palpation right leg and thigh  No edema or erythema   Neurological:      General: No focal deficit present.      Mental Status: She is alert and oriented to person, place, and time.      Sensory: No sensory deficit.          Reviewed most current lab test results and cultures  YES  Reviewed most current radiology test results   YES  Review and summation of old records today    NO  Reviewed patient's current orders and MAR    YES  PMH/SH reviewed - no change compared to H&P  ________________________________________________________________________  Care Plan discussed with:    Comments   Patient x    Family      RN x    Care Manager     Consultant                        Multidiciplinary team rounds were held today with case manager, nursing, pharmacist and Higher education careers adviser.  Patient's plan of care was discussed; medications were reviewed and discharge planning was addressed.     ________________________________________________________________________  Total NON critical care TIME:  35  Minutes    Total CRITICAL CARE TIME Spent:   Minutes non procedure based      Comments   >50% of visit spent in counseling and coordination of care     ________________________________________________________________________  Aida Puffer, PA-C     Procedures: see electronic medical records for all procedures/Xrays and details which were not copied into this note but were reviewed prior to creation of Plan.      LABS:  I reviewed today's most current labs and imaging studies.  Pertinent labs include:  Recent Labs     02/25/22  2312 02/27/22  1215   WBC 11.8* 16.1*   HGB 12.7 12.4   HCT 40.0 39.8   PLT 359 312       Recent  Labs     02/25/22  2312  02/26/22  0456   NA 138 139   K 4.3 3.6   CL 108 111*   CO2 28 23   BUN 27* 21*   PHOS  --  2.6   ALT 17  --    INR 1.0  --          Signed: Aida Puffer, PA-C

## 2022-02-28 ENCOUNTER — Inpatient Hospital Stay: Admit: 2022-02-28 | Payer: MEDICARE | Primary: Family

## 2022-02-28 LAB — COMPREHENSIVE METABOLIC PANEL
ALT: 10 U/L — ABNORMAL LOW (ref 12–78)
AST: 9 U/L — ABNORMAL LOW (ref 15–37)
Albumin/Globulin Ratio: 0.8 — ABNORMAL LOW (ref 1.1–2.2)
Albumin: 2.4 g/dL — ABNORMAL LOW (ref 3.5–5.0)
Alk Phosphatase: 74 U/L (ref 45–117)
Anion Gap: 5 mmol/L (ref 5–15)
BUN/Creatinine Ratio: 20 (ref 12–20)
BUN: 16 mg/dL (ref 6–20)
CO2: 24 mmol/L (ref 21–32)
Calcium: 8 mg/dL — ABNORMAL LOW (ref 8.5–10.1)
Chloride: 112 mmol/L — ABNORMAL HIGH (ref 97–108)
Creatinine: 0.8 mg/dL (ref 0.55–1.02)
Est, Glom Filt Rate: >60 mL/min/{1.73_m2} (ref >60)
Globulin: 3 g/dL (ref 2.0–4.0)
Glucose: 106 mg/dL — ABNORMAL HIGH (ref 65–100)
Potassium: 3.7 mmol/L (ref 3.5–5.1)
Sodium: 141 mmol/L (ref 136–145)
Total Bilirubin: 0.2 mg/dL (ref 0.2–1.0)
Total Protein: 5.4 g/dL — ABNORMAL LOW (ref 6.4–8.2)

## 2022-02-28 LAB — CBC WITH AUTO DIFFERENTIAL
Basophils %: 0 % (ref 0–1)
Basophils Absolute: 0 10*3/uL (ref 0.0–0.1)
Eosinophils %: 1 % (ref 0–7)
Eosinophils Absolute: 0.1 10*3/uL (ref 0.0–0.4)
Hematocrit: 30.8 % — ABNORMAL LOW (ref 35.0–47.0)
Hemoglobin: 10.1 g/dL — ABNORMAL LOW (ref 11.5–16.0)
Immature Granulocytes %: 1 % — ABNORMAL HIGH (ref 0–0.5)
Immature Granulocytes Absolute: 0.1 10*3/uL — ABNORMAL HIGH (ref 0.00–0.04)
Lymphocytes %: 14 % (ref 12–49)
Lymphocytes Absolute: 1.9 10*3/uL (ref 0.8–3.5)
MCH: 29.9 PG (ref 26.0–34.0)
MCHC: 32.8 g/dL (ref 30.0–36.5)
MCV: 91.1 FL (ref 80.0–99.0)
MPV: 9.4 FL (ref 8.9–12.9)
Monocytes %: 9 % (ref 5–13)
Monocytes Absolute: 1.3 10*3/uL — ABNORMAL HIGH (ref 0.0–1.0)
Neutrophils %: 75 % (ref 32–75)
Neutrophils Absolute: 10.5 10*3/uL — ABNORMAL HIGH (ref 1.8–8.0)
Nucleated RBCs: 0 PER 100 WBC
Platelets: 292 10*3/uL (ref 150–400)
RBC: 3.38 M/uL — ABNORMAL LOW (ref 3.80–5.20)
RDW: 15.8 % — ABNORMAL HIGH (ref 11.5–14.5)
WBC: 13.8 10*3/uL — ABNORMAL HIGH (ref 3.6–11.0)
nRBC: 0 10*3/uL (ref 0.00–0.01)

## 2022-02-28 LAB — RENAL FUNCTION PANEL
Albumin: 2.4 g/dL — ABNORMAL LOW (ref 3.5–5.0)
Anion Gap: 4 mmol/L — ABNORMAL LOW (ref 5–15)
BUN/Creatinine Ratio: 21 — ABNORMAL HIGH (ref 12–20)
BUN: 16 mg/dL (ref 6–20)
CO2: 25 mmol/L (ref 21–32)
Calcium: 7.9 mg/dL — ABNORMAL LOW (ref 8.5–10.1)
Chloride: 112 mmol/L — ABNORMAL HIGH (ref 97–108)
Creatinine: 0.78 mg/dL (ref 0.55–1.02)
Est, Glom Filt Rate: >60 mL/min/{1.73_m2} (ref >60)
Glucose: 106 mg/dL — ABNORMAL HIGH (ref 65–100)
Phosphorus: 2.4 mg/dL — ABNORMAL LOW (ref 2.6–4.7)
Potassium: 3.7 mmol/L (ref 3.5–5.1)
Sodium: 141 mmol/L (ref 136–145)

## 2022-02-28 LAB — URINALYSIS WITH REFLEX TO CULTURE
Bilirubin Urine: NEGATIVE
Blood, Urine: NEGATIVE
Glucose, UA: 150 mg/dL — AB
Ketones, Urine: 5 mg/dL — AB
Nitrite, Urine: POSITIVE — AB
Protein, UA: NEGATIVE mg/dL
Specific Gravity, UA: 1.018 (ref 1.003–1.030)
Urobilinogen, Urine: 0.1 EU/dL (ref 0.1–1.0)
WBC, UA: >100 /hpf — ABNORMAL HIGH (ref 0–4)
pH, Urine: 6 (ref 5.0–8.0)

## 2022-02-28 LAB — PROCALCITONIN: Procalcitonin: <0.05 ng/mL — ABNORMAL HIGH

## 2022-02-28 MED ORDER — SODIUM CHLORIDE 0.9 % IV BOLUS
0.9 | Freq: Once | INTRAVENOUS | Status: AC
Start: 2022-02-28 — End: 2022-02-28
  Administered 2022-02-28: 10:00:00 500 mL via INTRAVENOUS

## 2022-02-28 MED ORDER — SODIUM CHLORIDE 0.9 % IV BOLUS
0.9 | Freq: Once | INTRAVENOUS | Status: DC
Start: 2022-02-28 — End: 2022-02-28

## 2022-02-28 MED FILL — ASPIRIN LOW STRENGTH 81 MG PO CHEW: 81 MG | ORAL | Qty: 4

## 2022-02-28 MED FILL — CEFAZOLIN SODIUM 1 G IJ SOLR: 1 g | INTRAMUSCULAR | Qty: 1000

## 2022-02-28 MED FILL — PANTOPRAZOLE SODIUM 20 MG PO TBEC: 20 MG | ORAL | Qty: 1

## 2022-02-28 MED FILL — OXYCODONE-ACETAMINOPHEN 5-325 MG PO TABS: 5-325 MG | ORAL | Qty: 1

## 2022-02-28 MED FILL — SODIUM CHLORIDE 0.9 % IV SOLN: 0.9 % | INTRAVENOUS | Qty: 500

## 2022-02-28 MED FILL — MIRTAZAPINE 30 MG PO TABS: 30 MG | ORAL | Qty: 1

## 2022-02-28 MED FILL — ATORVASTATIN CALCIUM 40 MG PO TABS: 40 MG | ORAL | Qty: 1

## 2022-02-28 NOTE — Progress Notes (Signed)
Orthopedic Progress Note    Date:02/28/2022       Room:508/01  Patient Name:[name]     Date of Birth:25-May-1945     Age:77 y.o.  female     SUBJECTIVE    01/28/2022: POD #1: Patient is status postop TFN of right hip.  She is resting comfortably in bed on rounds.  She states that overall she is doing well.  She denies any new pain/discomfort, nausea/vomiting, shortness of breath or chest pain.  She states her pain is well-controlled currently.  She has no additional complaints no concerns today.    VITALS         Temp  Avg: 97.6 F (36.4 C)  Min: 97.2 F (36.2 C)  Max: 97.9 F (36.6 C)  Pulse  Avg: 88.3  Min: 79  Max: 101  Systolic (24hrs), Avg:101 , Min:83 , Max:116    Diastolic (24hrs), Avg:66, Min:53, Max:80    Resp  Avg: 19.5  Min: 16  Max: 29  SpO2  Avg: 95.4 %  Min: 93 %  Max: 98 %  LABS      Recent Labs     02/25/22  2312 02/27/22  1215 02/28/22  0647   WBC 11.8* 16.1* 13.8*   RBC 4.29 4.22 3.38*   HGB 12.7 12.4 10.1*   HCT 40.0 39.8 30.8*   MCV 93.2 94.3 91.1   RDW 15.6* 15.3* 15.8*   PLT 359 312 292     Recent Labs     02/25/22  2312 02/26/22  0456 02/27/22  1215   NA 138 139 137   K 4.3 3.6 3.7   CL 108 111* 108   CO2 28 23 21    BUN 27* 21* 11   GLUCOSE 90 112* 132*   PHOS  --  2.6 3.3   PT/INR:  Recent Labs     02/25/22  2312   PROTIME 14.1   INR 1.0     ESR: --   Lab Results   Component Value Date/Time    CRP 5.30 10/20/2021 05:33 AM      Results       ** No results found for the last 336 hours. **           Physical Exam   AAOx  Non-labored Respirations  Abdomen Non-tender  MSK:  Right lower extremity: Dressing clean dry and intact.  No tenderness palpation to calf.  Plantar and dorsiflexion present.  Pedal pulse present.  Neurovascular grossly intact.    ASSESSMENT/PLAN         Patient is status postop day 1 following TFN of right hip.  Hemoglobin 10.1.  Will continue to monitor labs.  Overall patient is doing well.    Weight bearing: Toe-touch weightbearing, PT/OT ordered  DVT Prophylaxis: Aspirin  324 and SCDs  Continue pain management  Continue to apply ice to right hip  Anticipated discharge: To be determined  Ortho f/u: 2 weeks    Electronically signed by Marikay Alar, PA-C on 02/28/2022 at 10:38 AM

## 2022-02-28 NOTE — Progress Notes (Signed)
Physician Progress Note      PATIENT:               [name], [name]  CSN #:                  [id number]  DOB:                       1945/09/09  ADMIT DATE:       02/25/2022 10:52 PM  DISCH DATE:  Earnstine Regal  PROVIDER #:        Ilda Foil PA-C          QUERY TEXT:    Patient admitted with right hip fracture following fall.  Patient noted to have leukocytosis per Attending documentation, elevated   heartrate.  If possible, please document in progress notes and discharge summary if you   are evaluating and/or treating any of the following:    The medical record reflects the following:  Risk Factors:  -89 F presents with right hip pain following fall  -admitted with right intertrochanteric femur fracture    Clinical Indicators:  -11.8...16.1...13.8  -heartrates beginning 2/4 0933   107...109..102...95...112...122...101...101...92  -per Attending PN dated 02/27/22, "Leukocytosis; suspect reactive"  -procalcitonin <0.05    Treatment:  -labs, IV Ancef, IV fluids    Thank you,  Lucretia Field, BSN, RN, CCDS, CRCR  Clinical Documentation Specialist  [email or web address] or contact via [employer]  Options provided:  -- SIRS of non-infectious origin due without acute organ dysfunction  -- Other - I will add my own diagnosis  -- Disagree - Not applicable / Not valid  -- Disagree - Clinically unable to determine / Unknown  -- Refer to Clinical Documentation Reviewer    PROVIDER RESPONSE TEXT:    This patient has SIRS of non-infectious origin without acute organ   dysfunction.    Query created by: Lucretia Field on 02/28/2022 8:24 AM      Electronically signed by:  Ilda Foil PA-C 02/28/2022 2:52 PM

## 2022-02-28 NOTE — Progress Notes (Signed)
OCCUPATIONAL THERAPY EVALUATION  Patient: [name] (77 y.o. female)  Date: 02/28/2022  Primary Diagnosis: Fall, initial encounter [W19.XXXA]  Fracture, intertrochanteric, right femur, closed, initial encounter ([place]) [S72.141A]  Closed 2-part intertrochanteric fracture of right femur, initial encounter ([place]) [S72.141A]  Procedure(s) (LRB):  OPEN REDUCTION INTERNAL FIXATION RIGHT HIP, SYNTHES SHORT TFN; INTERTROCHANTERIC HIP FRACTURE (Right) 1 Day Post-Op   Precautions: Fall Risk, Weight Bearing Right Lower Extremity Weight Bearing: Toe Touch Weight Bearing              Recommendations for nursing mobility: AD and gt belt for bed to chair , Amb to bathroom with AD and gait belt, and Assist x1    In place during session:EKG/telemetry   ASSESSMENT  Pt is a 77 y.o. female presenting to [facility] s/p fall w/ c/o R hip pain, admitted 2/3 and currently s/p TFN of R hip; POD #1. Pt w/ orders to TTWB to RLE; pt educated prior to session and verbalized understanding. Pt received semi-supine in bed upon arrival, AXO x4 (additional time for month), and agreeable to OT evaluation.     Based on current observations, pt presents with decreased  functional mobility, independence in ADLs, ROM, strength, activity tolerance, endurance, balance, increased pain levels (see below for objective details and assist levels).     Overall, pt tolerates session fair with c/o 6/10 pain in RLE; nsg notified. Pt req'd min-mod A for sup>sit transfer and min A for STS using RW; unable to tolerate steps at this time d/t pain in RLE. Min vc's for maintaining WB status during session. Unable to adjust B socks at EOB; OT educated on compensatory strategies for LB dressing; pt verbalized understanding. Declined ADLs at this time. Demonstrated intact B grip and ROM. Pt will benefit from continued skilled OT services to address current impairments and improve IND and safety with self cares and functional transfers/mobility. Potential barriers for safe  discharge: pt is a high fall risk. Current OT d/c recommendation [place] once medically appropriate.    GOALS:    Problem: Occupational Therapy - Adult  Goal: By Discharge: Performs self-care activities at highest level of function for planned discharge setting.  See evaluation for individualized goals.  Description: FUNCTIONAL STATUS PRIOR TO ADMISSION:  pt reports she was IND w/ functional mobility and ADLs; h/o hip fx on LLE    HOME SUPPORT: Pt lives with roommates; she reports her roommate is home 24/7     Occupational Therapy Goals:  Initiated 02/28/2022  Patient/Family stated goal: I want to get better  Patient will perform grooming in standing with Modified Independence within 7 day(s).  Patient will perform UB bathing with Independence within 7 day(s).  Patient will perform LB bathing with Independence within 7 day(s).  Patient will perform toilet transfers with Modified Independence  within 7 day(s).  Patient will perform all aspects of toileting with Independence within 7 day(s).  Patient will participate in upper extremity therapeutic exercise/activities with Independence within 7 day(s).    Outcome: Progressing        PLAN :  Recommendations and Planned Interventions: self care training, therapeutic activities, functional mobility training, balance training, therapeutic exercise, endurance activities, and patient education    Recommend next OT session: LB dressing, LB bathing, and standing grooming    Frequency/Duration: Patient will be followed by occupational therapy:  3-5x/week to address goals.    Recommendation for discharge: (in order for the patient to meet his/her long term goals)  [place]  IF patient discharges home will need the following DME: TBD     SUBJECTIVE:   Patient stated "My hips is starting to hurt."    OBJECTIVE DATA SUMMARY:     Past Medical History:   Diagnosis Date    Chronic obstructive pulmonary disease ([place])     Hypercholesteremia     Insomnia      Sleep disorder      Past Surgical History:   Procedure Laterality Date    CT GUIDED CHEST TUBE  12/06/2018    CT GUIDED CHEST TUBE    HIP SURGERY Right 02/27/2022    OPEN REDUCTION INTERNAL FIXATION RIGHT HIP, SYNTHES SHORT TFN; INTERTROCHANTERIC HIP FRACTURE performed by Lynett Grimes, MD at [facility]  05/26/2021    LEFT HIP REVISION POSTERIOR APPROACH WITH ALLOGRAFT    PARTIAL HYSTERECTOMY (CERVIX NOT REMOVED)      SHOULDER SURGERY Left         Expanded or extensive additional review of patient history:   Lives With: Family, Other (comment) (roommate)  Type of Home: House  Home Layout: One level  Home Access: Stairs to enter with rails     Entrance Stairs - Rails: Both              Home Equipment: Angela Burke, rolling     Social/Functional History  Lives With: Family, Other (comment) (roommate)  Type of Home: House  Home Layout: One level  Home Access: Stairs to enter with [place] - Number of Steps: 3  Entrance Stairs - Rails: Both  Home Equipment: Angela Burke, [place] Help From: Family  ADL Assistance: Independent  Ambulation Assistance: Independent  Transfer Assistance: Independent      EXAMINATION OF PERFORMANCE DEFICITS:    Cognitive/Behavioral Status:  Orientation  Orientation Level: Oriented X4  Cognition  Overall Cognitive Status: Exceptions  Following Commands: Follows one step commands consistently  Safety Judgement: Decreased awareness of need for assistance  Problem Solving: Assistance required to generate solutions;Assistance required to identify errors made;Decreased awareness of errors  Insights: Decreased awareness of deficits  Initiation: Requires cues for some  Sequencing: Requires cues for some      Range of Motion:   AROM: Within functional limits       Strength:  Strength: Generally decreased, functional    Coordination:        Coordination: Generally decreased, functional      Tone & Sensation:      Sensation: Intact      Functional Mobility  and Transfers for ADLs:  Bed Mobility:  Bed Mobility Training  Bed Mobility Training: Yes  Interventions: Verbal cues;Safety awareness training;Tactile cues  Rolling: Minimum assistance;Assist X1;Additional time  Supine to Sit: Minimum assistance;Moderate assistance;Assist X1;Additional time  Scooting: Assist X1;Moderate assistance    Transfers:  Pharmacologist: Yes  Interventions: Verbal cues;Safety awareness training;Tactile cues;Weight shifting training/pressure relief  Sit to Stand: Minimum assistance;Assist X1;Adaptive equipment;Additional time  Stand to Sit: Minimum assistance;Assist X1;Adaptive equipment  Bed to Chair: Minimum assistance;Assist X1;Adaptive equipment      Balance:  Balance  Sitting: Intact  Standing: Impaired  Standing - Static: Fair;Constant support  Standing - Dynamic: Fair;Constant support      ADL Assessment:           LE Dressing: Dependent/Total  LE Dressing Skilled Clinical Factors: adjust B socks  Functional Measure:    KB Home	[place] AM-PACTM "6 Clicks"                                                       Daily Activity Inpatient Short Form  How much help from another person does the patient currently need... Total; A Lot A Little None   1.  Putting on and taking off regular lower body clothing? [x]   1 []   2 []   3 []   4   2.  Bathing (including washing, rinsing, drying)? []   1 [x]   2 []   3 []   4   3.  Toileting, which includes using toilet, bedpan or urinal? []  1 [x]   2 []   3 []   4   4.  Putting on and taking off regular upper body clothing? []   1 []   2 [x]   3 []   4   5.  Taking care of personal grooming such as brushing teeth? []   1 []   2 [x]   3 []   4   6.  Eating meals? []   1 []   2 []   3 [x]   4    2007, [employer], under license to [employer]. All rights reserved     Score: 15/24     Interpretation of Tool:  Represents clinically-significant functional categories (i.e. Activities of daily living).  Percentage of  Impairment CH    0%   CI    1-19% CJ    20-39% CK    40-59% CL    60-79% CM    80-99% CN     100%   AMPAC  Score 6-24 24 23  20-22 15-19 10-14 7-9 6     Occupational Therapy Evaluation Charge Determination   History Examination Decision-Making   LOW Complexity : Brief history review  LOW Complexity: 1-3 Performance deficits relating to physical, cognitive, or psychosocial skills that result in activity limitations and/or participation restrictions MEDIUM Complexity: Patient may present with comorbidities that affect occupational performance. Minimal to moderate modifications of tasks or assist (eg. physical or verbal) with assist is necessary to enable pt to complete eval      Based on the above components, the patient evaluation is determined to be of the following complexity level: Low    Pain Rating:  6/10   Pain Intervention(s):   nursing notified and rest    Activity Tolerance:   Fair  and requires rest breaks    After treatment patient left in no apparent distress:    Patient left in no apparent distress in bed, Call bell within reach, Bed/ chair alarm activated, and Side rails x3, bed locked and in lowest position    COMMUNICATION/EDUCATION:   The patient's plan of care was discussed with: Registered nurse    Patient Education  Education Given To: Patient  Education Provided: Role of Therapy;Plan of Care  Education Method: Demonstration;Verbal  Education Outcome: Verbalized understanding;Demonstrated understanding    The supervising occupational therapist and treating occupational therapist assistant have met to review this patient's progress and plan of care.    This patient's plan of care is appropriate for delegation to OTA.       Thank you for this referral,  Dewain Penning, OT  Minutes: 23

## 2022-02-28 NOTE — Progress Notes (Signed)
Hospitalist Progress Note    NAME:   [name]   DOB: Aug 28, 1945   MRN: [id number]     Date/Time: 02/28/2022 1:46 PM  Patient PCP: Rigoberto Noel, APRN - NP    Estimated discharge date: 24-48 hours  Barriers: Continued clinical improvement, echo, placement    Hospital Course:  [name] is  77 y.o. female with COPD, hyperlipidemia who was admitted 02/25/22 after a fall at home due to dizziness. X-ray right hip and pelvis show acute nondisplaced right intertrochanteric femur fracture, CT hip pending.  Ortho consulted, with plans to operate tomorrow.  CT head pending, echo pending, duplex carotids pending.  Vascular duplex carotid bilateral revealed 40 to 50% luminal compromise of right proximal ICA, 50 to 69% stenosis of left proximal ICA, however patent and antegrade flow, patient will need to follow-up with vascular surgery as an outpatient.  Orthopedics performed ORIF right hip, short TFN 2/5.  Patient recovering appropriately.  PT evaluated patient, recommended SNF.  Echo pending.  XR thoracic, lumbar spine revealed marked osseous demineralization limiting evaluation, multiple compression deformities of the thoracic vertebral bodies with kyphotic angulation.  WBC, Pro-Cal trending down.    Assessment / Plan:  Acute nondisplaced right intertrochanteric femur fracture, s/p ORIF POD #1  -Continue pain control, PT/OT, ambulate, incentive spirometer  - Ortho following  - PT recommended SNF    Dizziness, resolved  -CT head negative  - Echo pending  - Vascular duplex carotids with moderate luminal compromise of left and right ICA, suspect likely cause of dizziness  - Will have patient follow-up as an outpatient with vascular surgery for regular monitoring  - Lipid panel and A1c normal  - Continue atorvastatin and aspirin    GERD  - Continue Protonix    COPD  - Continue albuterol as needed    Hyperlipidemia  - Continue atorvastatin    Hypotension, resolved  - Suspect secondary to volume  contraction    Leukocytosis, resolving  - WBC 16.1, now 13.8  - Suspect reactive secondary to ongoing pain  - Pro-Cal remains 0.05  - Ancef completed    Medical Decision Making:   I personally reviewed labs: CMP, renal function panel, Pro-Cal, CBC  I personally reviewed imaging: XR thoracic and lumbar spine  Toxic drug monitoring: Lovenox for bleeding, monitor H&H  Discussed case with: Patient, nurse, Ortho        Code Status: Full  DVT Prophylaxis: SCDs  GI Prophylaxis: Protonix    Subjective:     Chief Complaint / Reason for Physician Visit  Patient seen in room.  She is much more alert and oriented today.  Discussed ongoing treatment plan, need for echo results prior to discharge.  She states that she would not be interested in SNF, however I related that I think it would be beneficial for her.  No other complaints at this time.  She does not report any pain.      Objective:     VITALS:   Last 24hrs VS reviewed since prior progress note. Most recent are:  Patient Vitals for the past 24 hrs:   BP Temp Temp src Pulse Resp SpO2   02/28/22 0821 102/66 97.5 F (36.4 C) Oral 80 18 93 %   02/28/22 0645 116/80 -- -- 83 -- --   02/28/22 0346 (!) 86/53 -- -- 79 -- 95 %   02/28/22 0345 (!) 83/58 97.2 F (36.2 C) Oral 80 17 96 %   02/27/22 2156 -- -- -- --  19 --   02/27/22 2126 -- -- -- -- 20 --   02/27/22 2120 105/68 -- Oral 95 20 93 %   02/27/22 2000 110/74 97.9 F (36.6 C) Oral 95 18 95 %   02/27/22 1502 101/68 97.7 F (36.5 C) Oral 80 18 98 %         No intake or output data in the 24 hours ending 02/28/22 1346       I had a face to face encounter and independently examined this patient on 02/28/2022, as outlined below:    Review of Systems   Constitutional:  Negative for diaphoresis, fatigue and fever.   Respiratory:  Negative for cough and shortness of breath.    Cardiovascular:  Negative for chest pain and leg swelling.   Gastrointestinal:  Negative for abdominal pain.   Genitourinary:  Negative for dysuria.    Musculoskeletal:         Right leg pain     Neurological:  Negative for dizziness, facial asymmetry, speech difficulty, weakness, light-headedness and headaches.   Psychiatric/Behavioral:  Negative for confusion.         PHYSICAL EXAM:  Physical Exam  Constitutional:       Appearance: She is not ill-appearing.   Eyes:      Extraocular Movements: Extraocular movements intact.   Cardiovascular:      Rate and Rhythm: Normal rate and regular rhythm.   Pulmonary:      Effort: Pulmonary effort is normal.      Breath sounds: Normal breath sounds.   Abdominal:      Palpations: Abdomen is soft.      Tenderness: There is no abdominal tenderness.   Musculoskeletal:      Comments: Surgical dressing clean dry intact  Dorsalis pedis 2+ bilaterally  Tenderness to palpation right leg and thigh  No edema or erythema   Neurological:      General: No focal deficit present.      Mental Status: She is alert and oriented to person, place, and time.      Sensory: No sensory deficit.          Reviewed most current lab test results and cultures  YES  Reviewed most current radiology test results   YES  Review and summation of old records today    NO  Reviewed patient's current orders and MAR    YES  PMH/SH reviewed - no change compared to H&P  ________________________________________________________________________  Care Plan discussed with:    Comments   Patient x    Family      RN x    Care Manager     Consultant                        Multidiciplinary team rounds were held today with case manager, nursing, pharmacist and Occupational psychologist.  Patient's plan of care was discussed; medications were reviewed and discharge planning was addressed.     ________________________________________________________________________  Total NON critical care TIME:  35  Minutes    Total CRITICAL CARE TIME Spent:   Minutes non procedure based      Comments   >50% of visit spent in counseling and coordination of care      ________________________________________________________________________  Debarah Crape, PA-C     Procedures: see electronic medical records for all procedures/Xrays and details which were not copied into this note but were reviewed prior to creation of Plan.  LABS:  I reviewed today's most current labs and imaging studies.  Pertinent labs include:  Recent Labs     02/25/22  2312 02/27/22  1215 02/28/22  0647   WBC 11.8* 16.1* 13.8*   HGB 12.7 12.4 10.1*   HCT 40.0 39.8 30.8*   PLT 359 312 292       Recent Labs     02/25/22  2312 02/26/22  0456 02/27/22  1215 02/28/22  0647   NA 138 139 137 141  141   K 4.3 3.6 3.7 3.7  3.7   CL 108 111* 108 112*  112*   CO2 28 23 21 25  24    BUN 27* 21* 11 16  16    PHOS  --  2.6 3.3 2.4*   ALT 17  --   --  10*   INR 1.0  --   --   --          Signed: Debarah Crape, PA-C

## 2022-02-28 NOTE — Plan of Care (Signed)
PHYSICAL THERAPY EVALUATION  Patient: [name] (77 y.o. female)  Date: 02/28/2022  Primary Diagnosis: Fall, initial encounter [W19.XXXA]  Fracture, intertrochanteric, right femur, closed, initial encounter ([place]) [S72.141A]  Closed 2-part intertrochanteric fracture of right femur, initial encounter ([place]) [S72.141A]  Procedure(s) (LRB):  OPEN REDUCTION INTERNAL FIXATION RIGHT HIP, SYNTHES SHORT TFN; INTERTROCHANTERIC HIP FRACTURE (Right) 1 Day Post-Op   Precautions: Fall Risk, Weight Bearing Right Lower Extremity Weight Bearing: Toe Touch Weight Bearing                    Recommendations for nursing mobility: Out of bed to chair for meals, Encourage HEP in prep for ADLs/mobility; see handout for details, Frequent repositioning to prevent skin breakdown, Use of bed/chair alarm for safety, Use of BSC for toileting , AD and gt belt for bed to chair , and Assist x1    In place during session: External Catheter    ASSESSMENT  Pt is a 77 y.o. female with PMH of  COPD, hyperlipidemia admitted on 02/25/2022  following a fall; pt currently being treated for IT fracture R femur, ORIF R LE . Pt received semi supine on bed upon PT arrival, agreeable to evaluation. Pt A&O x self, time, disoriented to place.      Based on the objective data described below, the patient currently presents with impaired functional mobility, decreased ROM, impaired strength, impaired sensation, decreased safety awareness, impaired cognition, decreased coordination, impaired balance, and increased pain levels. (See below for objective details and assist levels).     Overall pt tolerated session fair today with c/o pain r LE with movement - 6-7/10. Pt educated about toe touch weight bearing R LE, verbalizes understanding. Pt  required additional time, minA/modA and vc's for bed mobility and minA for transfers with cues for safety and technique. Pt able to take few steps towards the chair - 2' with min/modA, gt belt and Rw; demonstrates unsteadiness  with decreased step clearance, requires vc's to maintain TTWB R LE effectively. Pt will benefit from continued skilled PT to address above deficits and return to PLOF. Potential barriers for safe discharge: pts current support system is unable to meet their requirements for physical assistance, pt has poor safety awareness, pt has impaired cognition, pt is a high fall risk, pt is not safe to be alone, and concern for pt safely navigating or managing the home environment. Current PT DC recommendation [place] once medically appropriate. Pt's caregiver/roommate in the room and reports 3 recent falls, will be unable to take care of her and requesting for placement.      Start of Session End of Session   SPO2 (%) 95 93   Heart Rate (BPM) 104 110     GOALS:  Problem: Physical Therapy - Adult  Goal: By Discharge: Performs mobility at highest level of function for planned discharge setting.  See evaluation for individualized goals.  Description: FUNCTIONAL STATUS PRIOR TO ADMISSION: Patient was independent and active without use of DME.    HOME SUPPORT PRIOR TO ADMISSION: The patient lived with 2 roomamtes but did not require assistance.    Physical Therapy Goals  Initiated 02/28/2022  Pt stated goal: To go home  Pt will be I with LE HEP in 7 days.  Pt will perform bed mobility with Modified [place] in 7 days.  Pt will perform transfers with Modified [place] in 7 days.   Pt will amb 40 feet with LRAD safely with Modified [place] in 7 days.  Pt will ascend/descend 3 steps with b/l handrail(s) and Modified [place] in 7 days to safely enter/navigate home.   Outcome: Progressing     PLAN :  Recommendations and Planned Interventions: bed mobility training, transfer training, gait training, therapeutic exercises, neuromuscular re-education, patient and family training/education, and therapeutic activities    Frequency/Duration: Patient will be followed by physical therapy:  3-5x/week to address  goals.    Recommendation for discharge: (in order for the patient to meet his/her long term goals)  [place]    IF patient discharges home will need the following DME: continuing to assess with progress       SUBJECTIVE:   Patient stated " It hurts when I move"    OBJECTIVE DATA SUMMARY:   HISTORY:    Past Medical History:   Diagnosis Date    Chronic obstructive pulmonary disease ([place])     Hypercholesteremia     Insomnia     Sleep disorder      Past Surgical History:   Procedure Laterality Date    CT GUIDED CHEST TUBE  12/06/2018    CT GUIDED CHEST TUBE    ORTHOPEDIC SURGERY  05/26/2021    LEFT HIP REVISION POSTERIOR APPROACH WITH ALLOGRAFT    PARTIAL HYSTERECTOMY (CERVIX NOT REMOVED)      SHOULDER SURGERY Left        Home Situation:  Social/Functional History  Lives With: Family, Other (comment) (roommate)  Type of Home: House  Home Layout: One level  Home Access: Stairs to enter with rails  Entrance Stairs - Number of Steps: 3  Entrance Stairs - Rails: Both  Home Equipment: Angela Burke, rolling  Receives Help From: Family  ADL Assistance: Independent  Ambulation Assistance: Independent  Transfer Assistance: Independent    Cognitive/Behavioral Status:  Orientation  Orientation Level: Oriented to time;Oriented to person;Disoriented to place  Cognition  Overall Cognitive Status: Exceptions  Following Commands: Follows one step commands consistently  Safety Judgement: Decreased awareness of need for assistance  Problem Solving: Assistance required to generate solutions;Assistance required to identify errors made;Decreased awareness of errors  Insights: Decreased awareness of deficits  Initiation: Requires cues for some  Sequencing: Requires cues for some    Skin: intact where exposed    Strength:    Strength: Generally decreased, functional    Range Of Motion:  AROM: Generally decreased, functional       Coordination:  Coordination: Generally decreased, functional     Tone & Sensation:   Tone:  Normal  Sensation: Impaired (decreased on R LE)      Functional Mobility:  Bed Mobility:     Bed Mobility Training  Bed Mobility Training: Yes  Interventions: Verbal cues;Safety awareness training;Tactile cues  Rolling: Minimum assistance;Assist X1;Additional time  Supine to Sit: Minimum assistance;Moderate assistance;Assist X1;Additional time  Scooting: Minimum assistance;Assist X1  Transfers:     Transfer Training  Transfer Training: Yes  Interventions: Verbal cues;Safety awareness training;Tactile cues;Weight shifting training/pressure relief  Sit to Stand: Minimum assistance;Assist X1;Adaptive equipment;Additional time  Stand to Sit: Minimum assistance;Assist X1;Adaptive equipment  Bed to Chair: Minimum assistance;Assist X1;Adaptive equipment  Balance:     Balance  Sitting: Intact  Standing: Impaired  Standing - Static: Fair;Constant support  Standing - Dynamic: Fair;Poor;Constant support  Wheelchair Mobility:     Wheelchair Management  Wheelchair Management: No  Ambulation/Gait Training:     Gait Training: Yes  Right Side Weight Bearing: Toe touch    Gait  Overall Level of Assistance: Minimum assistance;Assist X1;Moderate  assistance  Distance (ft): 2 Feet  Assistive Device: Gait belt;Walker, rolling  Interventions: Verbal cues;Safety awareness training;Tactile cues;Weight shifting training/pressure relief  Base of Support: Narrowed  Gait Abnormalities: Decreased step clearance    Functional Measure:  KB Home	[place] AM-PACT "6 Clicks"         Basic Mobility Inpatient Short Form  How much difficulty does the patient currently have... Unable A Lot A Little None   1.  Turning over in bed (including adjusting bedclothes, sheets and blankets)?   []  1   []  2   [x]  3   []  4   2.  Sitting down on and standing up from a chair with arms ( e.g., wheelchair, bedside commode, etc.)   []  1   []  2   [x]  3   []  4   3.  Moving from lying on back to sitting on the side of the bed?   []  1   [x]  2   []  3   []  4          How much  help from another person does the patient currently need... Total A Lot A Little None   4.  Moving to and from a bed to a chair (including a wheelchair)?   []  1   [x]  2   []  3   []  4   5.  Need to walk in hospital room?   []  1   [x]  2   []  3   []  4   6.  Climbing 3-5 steps with a railing?   [x]  1   []  2   []  3   []  4    2007, Trustees [employer], under license to [employer]. All rights reserved     Score:  Initial: 13/24 Most Recent: X (Date: 02/28/2022 )   Interpretation of Tool:  Represents activities that are increasingly more difficult (i.e. Bed mobility, Transfers, Gait).  Score 24 23 22-20 19-15 14-10 9-7 6   Modifier CH CI CJ CK CL CM CN         Physical Therapy Evaluation Charge Determination   History Examination Presentation Decision-Making   MEDIUM  Complexity : 1-2 comorbidities / personal factors will impact the outcome/ POC  MEDIUM Complexity : 3 Standardized tests and measures addressing body structure, function, activity limitation and / or participation in recreation  LOW Complexity : Stable, uncomplicated  Other outcome measures ampac 6  MEDIUM      Based on the above components, the patient evaluation is determined to be of the following complexity level: LOW    Pain Rating:  7/10 Pain R LE  Pain Intervention(s):   patient medicated for pain prior to session    Activity Tolerance:   Fair     After treatment patient left in no apparent distress:   Bed locked and in lowest position Patient left in no apparent distress sitting up in chair, Call bell within reach, and Bed/ chair alarm activated and nsg updated.    COMMUNICATION/EDUCATION:   The patient's plan of care was discussed with: Registered nurse    Patient Education  Education Given To: Patient  Education Provided: Role of Therapy;Plan of Care;Transfer Training;Equipment;Orientation  Education Method: Verbal  Barriers to Learning: Cognition  Education Outcome: Verbalized understanding;Continued education needed     Thank you for  this referral.  Gerri Spore, PT  Minutes: 43

## 2022-02-28 NOTE — Plan of Care (Signed)
Problem: Discharge Planning  Goal: Discharge to home or other facility with appropriate resources  Outcome: Progressing  Flowsheets (Taken 02/27/2022 2118 by Tonita Cong, LPN)  Discharge to home or other facility with appropriate resources: Identify barriers to discharge with patient and caregiver     Problem: Pain  Goal: Verbalizes/displays adequate comfort level or baseline comfort level  Outcome: Progressing     Problem: Skin/Tissue Integrity  Goal: Absence of new skin breakdown  Description: 1.  Monitor for areas of redness and/or skin breakdown  2.  Assess vascular access sites hourly  3.  Every 4-6 hours minimum:  Change oxygen saturation probe site  4.  Every 4-6 hours:  If on nasal continuous positive airway pressure, respiratory therapy assess nares and determine need for appliance change or resting period.  Outcome: Progressing     Problem: Safety - Adult  Goal: Free from fall injury  Outcome: Progressing     Problem: ABCDS Injury Assessment  Goal: Absence of physical injury  Outcome: Progressing     Problem: Physical Therapy - Adult  Goal: By Discharge: Performs mobility at highest level of function for planned discharge setting.  See evaluation for individualized goals.  Description: FUNCTIONAL STATUS PRIOR TO ADMISSION: Patient was independent and active without use of DME.    HOME SUPPORT PRIOR TO ADMISSION: The patient lived with 2 roomamtes but did not require assistance.    Physical Therapy Goals  Initiated 02/28/2022  Pt stated goal: To go home  Pt will be I with LE HEP in 7 days.  Pt will perform bed mobility with Modified Independence in 7 days.  Pt will perform transfers with Modified Independence in 7 days.   Pt will amb 40 feet with LRAD safely with Modified [place] in 7 days.  Pt will ascend/descend 3 steps with b/l handrail(s) and Modified [place] in 7 days to safely enter/navigate home.   02/28/2022 0938 by Gerri Spore, PT  Outcome: Progressing

## 2022-02-28 NOTE — Progress Notes (Signed)
Echo completed. Report to follow.

## 2022-03-01 LAB — ECHO (TTE) LIMITED (PRN CONTRAST/BUBBLE/STRAIN/3D)
AV Mean Gradient: 4 mmHg
AV Mean Velocity: 0.9 m/s
AV Peak Gradient: 6 mmHg
AV Peak Velocity: 1.3 m/s
AV VTI: 25.6 cm
AV Velocity Ratio: 0.69
Body Surface Area: 1.38 m2
E/E' Lateral: 6.5
E/E' Ratio (Averaged): 8.13
Fractional Shortening 2D: 32 % (ref 28–44)
IVSd: 0.8 cm (ref 0.6–0.9)
LA Area 4C: 15.2 cm2
LA Diameter: 3 cm
LA Major Axis: 5.2 cm
LA Size Index: 2.14 cm/m2
LA Volume Index MOD A4C: 24 ml/m2 (ref 16–34)
LA Volume MOD A4C: 34 mL (ref 22–52)
LV E' Lateral Velocity: 12 cm/s
LV E' Septal Velocity: 8 cm/s
LV EDV A4C: 67 mL
LV EDV Index A4C: 48 mL/m2
LV ESV A4C: 30 mL
LV ESV Index A4C: 21 mL/m2
LV Ejection Fraction A4C: 56 %
LV Mass 2D Index: 63.8 g/m2 (ref 43–95)
LV Mass 2D: 89.4 g (ref 67–162)
LV RWT Ratio: 0.34
LVIDd Index: 2.93 cm/m2
LVIDd: 4.1 cm (ref 3.9–5.3)
LVIDs Index: 2 cm/m2
LVIDs: 2.8 cm
LVOT Mean Gradient: 2 mmHg
LVOT Peak Gradient: 4 mmHg
LVOT Peak Velocity: 0.9 m/s
LVOT VTI: 20.2 cm
LVOT:AV VTI Index: 0.79
LVPWd: 0.7 cm (ref 0.6–0.9)
MR Peak Gradient: 29 mmHg
MR Peak Velocity: 2.7 m/s
MV A Velocity: 0.8 m/s
MV E Velocity: 0.78 m/s
MV E Wave Deceleration Time: 161 ms
MV E/A: 0.98
MV Max Velocity: 1 m/s
MV Mean Gradient: 2 mmHg
MV Mean Velocity: 0.7 m/s
MV Peak Gradient: 4 mmHg
MV VTI: 26.4 cm
MV:LVOT VTI Index: 1.31
RV Free Wall Peak S': 15 cm/s
TAPSE: 2.5 cm (ref 1.7–?)

## 2022-03-01 LAB — CBC WITH AUTO DIFFERENTIAL
Basophils %: 0 % (ref 0–1)
Basophils Absolute: 0 10*3/uL (ref 0.0–0.1)
Eosinophils %: 7 % (ref 0–7)
Eosinophils Absolute: 0.7 10*3/uL — ABNORMAL HIGH (ref 0.0–0.4)
Hematocrit: 30.8 % — ABNORMAL LOW (ref 35.0–47.0)
Hemoglobin: 9.9 g/dL — ABNORMAL LOW (ref 11.5–16.0)
Immature Granulocytes %: 1 % — ABNORMAL HIGH (ref 0–0.5)
Immature Granulocytes Absolute: 0.1 10*3/uL — ABNORMAL HIGH (ref 0.00–0.04)
Lymphocytes %: 21 % (ref 12–49)
Lymphocytes Absolute: 2.1 10*3/uL (ref 0.8–3.5)
MCH: 29.5 PG (ref 26.0–34.0)
MCHC: 32.1 g/dL (ref 30.0–36.5)
MCV: 91.7 FL (ref 80.0–99.0)
MPV: 9.4 FL (ref 8.9–12.9)
Monocytes %: 8 % (ref 5–13)
Monocytes Absolute: 0.8 10*3/uL (ref 0.0–1.0)
Neutrophils %: 63 % (ref 32–75)
Neutrophils Absolute: 6.5 10*3/uL (ref 1.8–8.0)
Nucleated RBCs: 0 PER 100 WBC
Platelets: 297 10*3/uL (ref 150–400)
RBC: 3.36 M/uL — ABNORMAL LOW (ref 3.80–5.20)
RDW: 15.8 % — ABNORMAL HIGH (ref 11.5–14.5)
WBC: 10.1 10*3/uL (ref 3.6–11.0)
nRBC: 0 10*3/uL (ref 0.00–0.01)

## 2022-03-01 LAB — RENAL FUNCTION PANEL
Albumin: 2.3 g/dL — ABNORMAL LOW (ref 3.5–5.0)
Anion Gap: 3 mmol/L — ABNORMAL LOW (ref 5–15)
BUN/Creatinine Ratio: 23 — ABNORMAL HIGH (ref 12–20)
BUN: 16 mg/dL (ref 6–20)
CO2: 26 mmol/L (ref 21–32)
Calcium: 7.9 mg/dL — ABNORMAL LOW (ref 8.5–10.1)
Chloride: 115 mmol/L — ABNORMAL HIGH (ref 97–108)
Creatinine: 0.71 mg/dL (ref 0.55–1.02)
Est, Glom Filt Rate: >60 mL/min/{1.73_m2} (ref >60)
Glucose: 89 mg/dL (ref 65–100)
Phosphorus: 2.1 mg/dL — ABNORMAL LOW (ref 2.6–4.7)
Potassium: 3.6 mmol/L (ref 3.5–5.1)
Sodium: 144 mmol/L (ref 136–145)

## 2022-03-01 LAB — PROCALCITONIN: Procalcitonin: 0.09 ng/mL — ABNORMAL HIGH

## 2022-03-01 MED ORDER — ACETAMINOPHEN 500 MG PO TABS
500 MG | Freq: Three times a day (TID) | ORAL | Status: DC
Start: 2022-03-01 — End: 2022-03-02
  Administered 2022-03-01 – 2022-03-02 (×3): 1000 mg via ORAL

## 2022-03-01 MED ORDER — CEFTRIAXONE SODIUM 1 G IJ SOLR
1 g | INTRAMUSCULAR | Status: DC
Start: 2022-03-01 — End: 2022-03-02
  Administered 2022-03-01 – 2022-03-02 (×2): 1000 mg via INTRAVENOUS

## 2022-03-01 MED ORDER — ASPIRIN 81 MG PO CHEW
81 | ORAL_TABLET | Freq: Every day | ORAL | 0 refills | Status: AC
Start: 2022-03-01 — End: 2022-04-01

## 2022-03-01 MED ORDER — OXYCODONE HCL 5 MG PO TABS
5 MG | ORAL | Status: DC | PRN
Start: 2022-03-01 — End: 2022-03-02
  Administered 2022-03-02: 02:00:00 5 mg via ORAL

## 2022-03-01 MED ORDER — ACETAMINOPHEN 650 MG RE SUPP
650 MG | Freq: Three times a day (TID) | RECTAL | Status: DC
Start: 2022-03-01 — End: 2022-03-02

## 2022-03-01 MED ORDER — CEPHALEXIN 500 MG PO CAPS
500 MG | ORAL_CAPSULE | Freq: Two times a day (BID) | ORAL | 0 refills | Status: DC
Start: 2022-03-01 — End: 2022-03-02

## 2022-03-01 MED FILL — OXYCODONE-ACETAMINOPHEN 5-325 MG PO TABS: 5-325 MG | ORAL | Qty: 1

## 2022-03-01 MED FILL — METOPROLOL SUCCINATE ER 50 MG PO TB24: 50 MG | ORAL | Qty: 1

## 2022-03-01 MED FILL — CEFTRIAXONE SODIUM 1 G IJ SOLR: 1 g | INTRAMUSCULAR | Qty: 1000

## 2022-03-01 MED FILL — PANTOPRAZOLE SODIUM 20 MG PO TBEC: 20 MG | ORAL | Qty: 1

## 2022-03-01 MED FILL — ATORVASTATIN CALCIUM 40 MG PO TABS: 40 MG | ORAL | Qty: 1

## 2022-03-01 MED FILL — MIRTAZAPINE 30 MG PO TABS: 30 MG | ORAL | Qty: 1

## 2022-03-01 MED FILL — ASPIRIN LOW STRENGTH 81 MG PO CHEW: 81 MG | ORAL | Qty: 4

## 2022-03-01 MED FILL — ACETAMINOPHEN EXTRA STRENGTH 500 MG PO TABS: 500 MG | ORAL | Qty: 2

## 2022-03-01 NOTE — Discharge Summary (Cosign Needed)
Discharge Summary    Name: [name]  [id number]  Date of Birth: March 27, 1945 (Age: 77 y.o.)   Date of Admission: 02/25/2022  Date of Discharge: 03/01/2022  Attending Physician: Caitlin Kohut, MD    Discharge Diagnosis:   Principal Problem (Resolved):    Fracture, intertrochanteric, right femur, closed, initial encounter (HCC)  Active Problems:    S/P ORIF (open reduction internal fixation) fracture  Resolved Problems:    Intertrochanteric fracture of right femur, closed, initial encounter (HCC)    Dizziness       Consultations:  IP CONSULT TO ORTHOPEDIC SURGERY      Brief Admission History/Reason for Admission Per Caitlin Cloud, MD:   [name] is  77 y.o. female with COPD, hyperlipidemia presents to the emergency room after a fall at home.  States she was feeling dizzy since morning and not feeling good.  She tried to get up to go to the bathroom, felt dizzy and fell.  She knows that she is falling, did not lose consciousness, shouted to so family members attended immediately.  Denies any chest pain, palpitations, cough or trouble breathing, nausea or vomiting.  No urinary frequency or burning. Found with right intertrochanteric nondisplaced fracture.  Already contacted orthopedic physician, will be following up.    Brief Hospital Course by Main Problems:   Patient was admitted to the hospital for further evaluation and management.X-ray right hip and pelvis show acute nondisplaced right intertrochanteric femur fracture, CT hip pending.  Ortho consulted, suggested surgical fixation.   Vascular duplex carotid bilateral revealed 40 to 50% luminal compromise of right proximal ICA, 50 to 69% stenosis of left proximal ICA, however patent and antegrade flow, patient will need to follow-up with vascular surgery as an outpatient, likely because of dizziness.  Orthopedics performed ORIF right hip, short TFN 2/5.  Patient recovered appropriately.  PT evaluated patient,  recommended SNF, however patient declined and would like to go home with home health.  Echo revealed EF 55 to 60%, normal left ventricular systolic function.  XR thoracic, lumbar spine revealed marked osseous demineralization limiting evaluation, multiple compression deformities of the thoracic vertebral bodies with kyphotic angulation.  WBC, Pro-Cal continuing to trend down.  Nutrition evaluated patient, recommended Ensure Enlive x 2 daily. UA revealed UTI, Keflex initiated.  Lipid panel and A1c normal.  Patient improved symptomatically back to baseline, dizziness has resolved.  Orthopedics cleared patient for discharge with outpatient follow-up, continue ASA 324, outpatient PT/OT.  New patient referral sent to vascular surgery for carotid artery stenosis.  Patient should also follow-up with PCP within 1 week for recheck of CBC, BMP.  At this time patient is medically stable for discharge, understands and agrees with the plan. Discharge is pending final HH recs from PT and DME, likely will d/c tomorrow 2/8.    Discharge Exam:  Patient seen and examined by me on discharge day.  Pertinent Findings:  Patient Vitals for the past 24 hrs:   BP Temp Temp src Pulse Resp SpO2 Height   03/01/22 1426 124/77 97.7 F (36.5 C) Oral 90 16 97 % --   03/01/22 1303 -- -- -- -- -- -- 1.6 m (5\' 3" )   03/01/22 0845 131/74 98.1 F (36.7 C) Oral (!) 103 16 93 % --   03/01/22 0108 118/68 97.8 F (36.6 C) Axillary 89 20 94 % --   02/28/22 2156 -- -- -- -- 18 -- --   02/28/22 2116 126/87 97.3 F (36.3 C) Oral (!) 113 20 94 % --  02/28/22 1608 -- -- -- -- 18 -- --   02/28/22 1511 98/61 97.5 F (36.4 C) Oral 94 18 94 % --       Physical Exam  Constitutional:       General: She is not in acute distress.     Appearance: She is not ill-appearing.   Cardiovascular:      Rate and Rhythm: Normal rate and regular rhythm.      Pulses: Normal pulses.   Pulmonary:      Effort: Pulmonary effort is normal. No respiratory distress.      Breath  sounds: Normal breath sounds. No wheezing.   Abdominal:      General: Abdomen is flat. There is no distension.      Palpations: Abdomen is soft.      Tenderness: There is no abdominal tenderness.   Musculoskeletal:         General: No tenderness. Normal range of motion.      Right lower leg: No edema.      Left lower leg: No edema.      Comments: Right-sided post op dressing clean dry intact   Skin:     General: Skin is warm and dry.   Neurological:      Mental Status: She is alert and oriented to person, place, and time.   Psychiatric:         Mood and Affect: Mood normal.         Behavior: Behavior normal.         Discharge/Recent Laboratory Results:  Recent Labs     03/01/22  0505   NA 144   K 3.6   CL 115*   CO2 26   BUN 16   CREATININE 0.71   GLUCOSE 89   CALCIUM 7.9*   PHOS 2.1*     Recent Labs     03/01/22  0505   HGB 9.9*   HCT 30.8*   WBC 10.1   PLT 297       Discharge Medications:     Medication List        START taking these medications      aspirin 81 MG chewable tablet  Take 4 tablets by mouth daily  Start taking on: March 02, 2022  Replaces: aspirin 81 MG EC tablet     cephALEXin 500 MG capsule  Commonly known as: KEFLEX  Take 1 capsule by mouth 2 times daily for 5 days            CONTINUE taking these medications      albuterol sulfate HFA 108 (90 Base) MCG/ACT inhaler  Commonly known as: PROVENTIL;VENTOLIN;PROAIR     atorvastatin 40 MG tablet  Commonly known as: LIPITOR     metoprolol succinate 25 MG extended release tablet  Commonly known as: TOPROL XL     mirtazapine 30 MG tablet  Commonly known as: REMERON     omeprazole 20 MG delayed release capsule  Commonly known as: PRILOSEC     vitamin D 1.25 MG (50000 UT) Caps capsule  Commonly known as: ERGOCALCIFEROL            STOP taking these medications      aspirin 81 MG EC tablet  Replaced by: aspirin 81 MG chewable tablet     metoprolol tartrate 25 MG tablet  Commonly known as: LOPRESSOR               Where to Get Your Medications  These  medications were sent to [id number] - [place], [place] - [address] - P [phone] - F [phone]  [address] [place] [address]      Phone: [phone]   aspirin 81 MG chewable tablet  cephALEXin 500 MG capsule             DISPOSITION:    Home with Family:       Home with HH/PT/OT/RN: x   SNF/LTC:    SA[facility]:    OTHER:            Code status:   Recommended diet:  Regular, high fiber, with ensure enlive BID  Recommended activity: activity as tolerated and no driving for today, continue PT/OT with home health  Wound care:  Gently cleanse incision site, cover with dressing until reevaluated by orthopedics      Follow up with:   PCP : Vevelyn Francois, APRN - NP  Vevelyn Francois, APRN - NP  [address]  [address]  [place] [address]  [phone]    Follow up in 1 week(s)  Follow up for recent hospital admission, patient will need recheck of CBC, BMP, assess for resolution of UTI    Jun, Hong Joon, MD  [address]  [address]  [place] [address]  [phone]    Follow up in 2 week(s)  New patient referral.  Patient was admitted to the hospital for dizziness, vascular duplex carotid bilateral revealed 40 to 50% luminal compromise of right proximal ICA, 50 to 69% stenosis of left proximal ICA, however patent and antegrade flow    Muron, Elmon Else, MD  [address]  [address]  [place] [address]  [phone]    Follow up in 1 week(s)  Follow-up for recent hospital admission, patient s/p TFN right hip          Total time in minutes spent coordinating this discharge (includes going over instructions, follow-up, prescriptions, and preparing report for sign off to her PCP) :  35 minutes

## 2022-03-01 NOTE — Progress Notes (Signed)
..  4 Eyes Skin Assessment     NAME:  [name]  DATE OF BIRTH:  05/02/1945  MEDICAL RECORD NUMBER:  [id number]    The patient is being assessed for  Other Weekly skin assessment    I agree that at least one RN has performed a thorough Head to Toe Skin Assessment on the patient. ALL assessment sites listed below have been assessed.      Areas assessed by both nurses:    Head, Face, Ears, Shoulders, Back, Chest, Arms, Elbows, Hands, Sacrum. Buttock, Coccyx, Ischium, and Legs. Feet and Heels        Does the Patient have a Wound? No noted wound(s)       Braden Prevention initiated by RN: Yes  Wound Care Orders initiated by RN: Yes    Pressure Injury (Stage 3,4, Unstageable, DTI, NWPT, and Complex wounds) if present, place Wound referral order by RN under ORDER ENTRY: No    New Ostomies, if present place, Ostomy referral order under ORDER ENTRY: No     Nurse 1 eSignature: Electronically signed by Currie Paris, LPN on 5/0/93 at 2:67 PM EST    **SHARE this note so that the co-signing nurse can place an eSignature**    Nurse 2 eSignature: Electronically signed by Thressa Sheller, RN on 03/01/22 at 8:26 PM EST

## 2022-03-01 NOTE — Care Coordination-Inpatient (Addendum)
Chart reviewed. POD #2 TFN of right hip. Therapy recs noted for SNF.    CM met with patient at bedside regarding discharge recs for SNF. Patient declined SNF. She has friends that are able to help her at home. CM offered HH and patient is agreeable. Choice letter signed. No preference of agency.    Referrals sent via [facility].    1410: [facility] has accepted. --Anticipated Start of Care 03-02-2022

## 2022-03-01 NOTE — Progress Notes (Addendum)
Orthopedic Progress Note    Date:03/01/2022       Room:508/01  Patient Name:[name]     Date of Birth:09-17-1945     Age:77 y.o.  female     SUBJECTIVE    02/28/2022: POD #1: Patient is status postop TFN of right hip.  She is resting comfortably in bed on rounds.  She states that overall she is doing well.  She denies any new pain/discomfort, nausea/vomiting, shortness of breath or chest pain.  She states her pain is well-controlled currently.  She has no additional complaints  or no concerns today.    03/01/2022: POD #2: Patient is status post-op TFN of right hip. She is sitting comfortable in bed, eating breakfast during rounds. She status that PT went well yesterday. She had no acute events overnight. She has no additional complaints or concerns today.     VITALS         Temp  Avg: 97.7 F (36.5 C)  Min: 97.3 F (36.3 C)  Max: 98.1 F (36.7 C)  Pulse  Avg: 99.8  Min: 89  Max: 595  Systolic (63OVF), IEP:329 , Min:98 , JJO:841    Diastolic (66AYT), KZS:01, Min:61, Max:87    Resp  Avg: 18.3  Min: 16  Max: 20  SpO2  Avg: 93.8 %  Min: 93 %  Max: 94 %  LABS      Recent Labs     02/27/22  1215 02/28/22  0647 03/01/22  0505   WBC 16.1* 13.8* 10.1   RBC 4.22 3.38* 3.36*   HGB 12.4 10.1* 9.9*   HCT 39.8 30.8* 30.8*   MCV 94.3 91.1 91.7   RDW 15.3* 15.8* 15.8*   PLT 312 292 297     Recent Labs     02/27/22  1215 02/28/22  0647 03/01/22  0505   NA 137 141  141 144   K 3.7 3.7  3.7 3.6   CL 108 112*  112* 115*   CO2 21 25  24 26    BUN 11 16  16 16    GLUCOSE 132* 106*  106* 89   PHOS 3.3 2.4* 2.1*   PT/INR:  No results for input(s): "PROTIME", "INR" in the last 72 hours.    ESR: --   Lab Results   Component Value Date/Time    CRP 5.30 10/20/2021 05:33 AM      Results       Procedure Component Value Units Date/Time    Culture, Urine [[id number]] Collected: 02/28/22 1420    Order Status: Canceled Specimen: Urine     Culture, Urine [[id number]] Collected: 02/28/22 1400    Order Status: No result Specimen: Urine Updated:  02/28/22 1805           Physical Exam   AAOx3  Non-labored Respirations  Abdomen Non-tender  MSK:  Right lower extremity: Dressing clean dry and intact.  No tenderness palpation to calf.  Plantar and dorsiflexion present.  Pedal pulse present.  Neurovascular grossly intact.    ASSESSMENT/PLAN         Patient is status postop day 2 following TFN of right hip. Hemoglobin 9.9 Will continue to monitor labs.  Overall patient is doing well.    Weight bearing: Toe-touch weightbearing, PT/OT ordered  DVT Prophylaxis: Aspirin 324 and SCDs  Continue pain management  Continue to apply ice to right hip  Anticipated discharge: To be determined  Ortho f/u: 2 weeks      14:42 PT/OT recommended discharge to  SNF when medically appropriate.  Patient refused to be discharged to a SNF and would like to do [facility].  She was approved by [facility] with an anticipated start date of 03/02/2022.  She has been medically cleared by primary team.  Spoke with Dr. Amparo Bristol about discharge plans per primary team and PT/OT and she is in agreement with discharge by primary team.      16:01: Discharged planned tentatively for tomorrow. DME will be needed prior to discharge.     Electronically signed by Peterson Lombard, PA-C on 03/01/2022 at 9:07 AM

## 2022-03-01 NOTE — Progress Notes (Signed)
Physician Progress Note      PATIENT:               VAYDA, DUNGEE  CSN #:                  [id number]  DOB:                       09-29-1945  ADMIT DATE:       02/25/2022 10:52 PM  [place] DATE:  Earnstine Regal  PROVIDER #:        Ilda Foil PA-C          QUERY TEXT:    Pt admitted with right hip fracture following fall.  Pt noted to have a decrease in hemoglobin since admission.  If possible, please document in the progress notes and discharge summary if   you are evaluating and/or treating any of the following:    The medical record reflects the following:  Risk Factors:  -31 F presents with right hip pain following fall; admitted with right hip   fracture    Clinical Indicators:  -hemoglobin beginning at admission 12.7.Marland KitchenMarland Kitchen12.4...10.1  -ORIF performed on 02/27/22 with EBL <102ml documented    Treatment:  -labs  Options provided:  -- Postoperative acute blood loss anemia  -- Dilutional anemia  -- Other - I will add my own diagnosis  -- Disagree - Not applicable / Not valid  -- Disagree - Clinically unable to determine / Unknown  -- Refer to Clinical Documentation Reviewer    PROVIDER RESPONSE TEXT:    This patient has postoperative acute blood loss anemia.    Query created by: Lucretia Field on 03/01/2022 4:59 AM      Electronically signed by:  Ilda Foil PA-C 03/01/2022 12:46 PM

## 2022-03-01 NOTE — Progress Notes (Signed)
Back to bed X1 person assist patient did very well will cont to monitor.

## 2022-03-01 NOTE — Plan of Care (Signed)
PHYSICAL THERAPY TREATMENT     Patient: [name] (77 y.o. female)  Date: 03/01/2022  Diagnosis: Fall, initial encounter [W19.XXXA]  Fracture, intertrochanteric, right femur, closed, initial encounter ([place]) [S72.141A]  Closed 2-part intertrochanteric fracture of right femur, initial encounter ([place]) [S72.141A] Fracture, intertrochanteric, right femur, closed, initial encounter ([place])  Procedure(s) (LRB):  OPEN REDUCTION INTERNAL FIXATION RIGHT HIP, SYNTHES SHORT TFN; INTERTROCHANTERIC HIP FRACTURE (Right) 2 Days Post-Op  Precautions: Fall Risk, Weight Bearing Right Lower Extremity Weight Bearing: Toe Touch Weight Bearing                    Recommendations for nursing mobility: Out of bed to chair for meals, Encourage HEP in prep for ADLs/mobility; see handout for details, Frequent repositioning to prevent skin breakdown, and Use of BSC for toileting     In place during session: None  Chart, physical therapy assessment, plan of care and goals were reviewed.  ASSESSMENT  Patient continues with skilled PT services and is progressing towards goals. Pt semi supine upon PT arrival, agreeable to session. Pt A&O x 4. (See below for objective details and assist levels).     Overall pt tolerated session fair today with bed mobility, transfers and therex, limited by activity tolerance and endurance. Pt participated in supine therex with manual cues for technique. Able to transfer to EOB with min mod A and cueing for task sequence. Pt with complaints of dizziness in sitting, BP 131/79, nursing notified. Pt required increased time for recovery, educated on techniques to improve dizziness. Tolerated transfer to recliner with mod A for transfers. Pt initially with increased fear of falling demo'ing posterior lean, improved with time. Demo's fair carry over with weightbearing precautions. Pt with complaints of pain at rest that improved post mobility. Pt declined ice at this time, reports too cold to tolerate, educated on  benefits of modality, pt reports will attempt at a later time. Will continue to benefit from skilled PT services, and will continue to progress as tolerated. Potential barriers for safe discharge: pt is a high fall risk, pt is not safe to be alone, concern for pt safely navigating or managing the home environment, and pt has new weight bearing restrictions limiting activity or patient is unable to maintain. Current PT DC recommendation [place] once medically appropriate.     Start of Session End of Session   SPO2 (%) 92 91   Heart Rate (BPM) 82 95     GOALS:    Problem: Physical Therapy - Adult  Goal: By Discharge: Performs mobility at highest level of function for planned discharge setting.  See evaluation for individualized goals.  Description: FUNCTIONAL STATUS PRIOR TO ADMISSION: Patient was independent and active without use of DME.    HOME SUPPORT PRIOR TO ADMISSION: The patient lived with 2 roomamtes but did not require assistance.    Physical Therapy Goals  Initiated 02/28/2022  Pt stated goal: To go home  Pt will be I with [facility] in 7 days.  Pt will perform bed mobility with Modified [place] in 7 days.  Pt will perform transfers with Modified [place] in 7 days.   Pt will amb 40 feet with LRAD safely with Modified [place] in 7 days.  Pt will ascend/descend 3 steps with b/l handrail(s) and Modified [place] in 7 days to safely enter/navigate home.   Outcome: Progressing          PLAN :  Patient continues to benefit from skilled intervention to address functional  impairments. Continue treatment per established plan of care to address goals.    Recommendation for discharge: (in order for the patient to meet his/her long term goals)  [place]    IF patient discharges home will need the following [facility]:[id number] to assess with progress     SUBJECTIVE:   Patient stated "I feel better."    OBJECTIVE DATA SUMMARY:   Critical Behavior:  Orientation  Orientation Level:  Oriented X4  Cognition  Following Commands: Follows one step commands consistently  Attention Span: Appears intact  Problem Solving: Assistance required to generate solutions;Assistance required to identify errors made;Decreased awareness of errors  Insights: Decreased awareness of deficits  Initiation: Requires cues for some  Sequencing: Requires cues for some    Functional Mobility Training:  Bed Mobility:  Bed Mobility Training  Interventions: Verbal cues;Safety awareness training;Tactile cues  Rolling: Minimum assistance;Assist X1;Additional time  Supine to Sit: Minimum assistance;Moderate assistance;Assist X1;Additional time  Scooting: Assist X1;Moderate assistance  Transfers:  Pharmacologist: Yes  Sit to Stand: Minimum assistance;Assist X1;Adaptive equipment;Additional time  Stand to Sit: Minimum assistance;Assist X1;Adaptive equipment  Balance:  Balance  Sitting: Intact  Standing: Impaired  Standing - Static: Fair;Constant support  Standing - Dynamic: Fair;Constant support  Wheelchair Mobility:     Ambulation/Gait Training:     Gait Training: Yes  Right Side Weight Bearing: Toe touch                       Gait  Overall Level of Assistance: Moderate assistance;Assist X1;Additional time  Distance (ft): 4 Feet  Assistive Device: Gait belt;Walker, rolling  Base of Support: Narrowed  Speed/Cadence: Shuffled;Slow                     Therapeutic Exercises:     EXERCISE   Sets   Reps   Active Active Assist   Passive Self ROM   Comments   Ankle Pumps 1 10 [x]  []  []  []     Quad Sets/Glut Sets 1 10 [x]  []  []  []     Hamstring Sets   []  []  []  []     Short Arc Quads   []  []  []  []     Heel Slides   []  []  []  []     Straight Leg Raises   []  []  []  []     Hip abd/add   []  []  []  []     Long Arc Quads   []  []  []  []     Marching   []  []  []  []        []  []  []  []        Pain Rating:  7-8/10 back and R hip pain reported  Pain Intervention(s):   rest and pain is at a level acceptable to the patient    Activity Tolerance:    Fair  and requires frequent rest breaks    After treatment patient left in no apparent distress:   Bed locked and returned to lowest position, Patient left in no apparent distress sitting up in chair and Call bell within reach, and nsg updated     COMMUNICATION/COLLABORATION:   The patient's plan of care was discussed with: Registered nurse    Patient Education  Education Given To: Patient  Education Provided: Transfer Training;Energy Conservation;Home Exercise Program;Plan of Care  Education Method: Verbal  Barriers to Learning: None  Education Outcome: Verbalized understanding;Continued education needed    Oneita Kras, PTA  Minutes: 31

## 2022-03-01 NOTE — Plan of Care (Signed)
Problem: Pain  Goal: Verbalizes/displays adequate comfort level or baseline comfort level  Outcome: Progressing  Flowsheets (Taken 02/28/2022 2116 by Meredeth Ide, RN)  Verbalizes/displays adequate comfort level or baseline comfort level:   Encourage patient to monitor pain and request assistance   Assess pain using appropriate pain scale   Administer analgesics based on type and severity of pain and evaluate response     Problem: Safety - Adult  Goal: Free from fall injury  Recent Flowsheet Documentation  Taken 02/28/2022 2116 by Meredeth Ide, RN  Free From Fall Injury: Instruct family/caregiver on patient safety

## 2022-03-01 NOTE — Progress Notes (Signed)
Comprehensive Nutrition Assessment    Type and Reason for Visit:  Initial (Low BMI)    Nutrition Recommendations/Plan:   Continue diet as High Fiber.  Ensure Enlive x2/d (700 kcal, 40 gm PRO).  Continue to monitor and document PO and ONS intakes, BM in I/Os.     Malnutrition Assessment:  Malnutrition Status:  No malnutrition (03/01/22 1302)    Context:  Acute Illness     Findings of the 6 clinical characteristics of malnutrition:  Energy Intake:  No significant decrease in energy intake  Weight Loss:  No significant weight loss     Body Fat Loss:  No significant body fat loss (age-related wasting)     Muscle Mass Loss:  No significant muscle mass loss (age-related wasting)    Fluid Accumulation:  No significant fluid accumulation    Grip Strength:  Not Performed    Nutrition Assessment:    Admitted for R. intertrochanteric fx. Screened for low BMI per stated weight. RD UTO CBW at visit, in chair. Pt familiar to RD, endorsed baseline of 100# >2 years ago; however, h/o weights as 91-119# within last 4 mths. Today, POD 3 TFN of R. Hip. PO intakes reported as ~25% of B tray w/ larger intake at L and D meals per Pt. Pt endorsed baseline of small Breakfast(hot/cold cereal +coffee); will update food preferences and add ONS. Continue diet w/ high fiber for theraoeutic management of diverticulosis. Labs: Cl 115, Ca 7.9, Phos 2.1, H/H 9.9/30.8. Meds; PPI, remeron, lopressor, rocephin, lipitor, aspirin.    Nutrition Related Findings:    NFPE w/ chronic wasting likely age-related per Pt. No N/V/D/C nor chewing/swallowing difficulties reported. Denied dysphagia hx. Last BM 2/6- soft. Trace RLE edema. Wound Type: Surgical Incision (R. femoral)       Current Nutrition Intake & Therapies:    Average Meal Intake: 1-25%  Average Supplements Intake: None Ordered  ADULT DIET; Regular    Anthropometric Measures:  Height: 160 cm (5\' 3" )  Ideal Body Weight (IBW): 115 lbs (52 kg)    Current Body Weight: 42.6 kg (94 lb), 81.7 % IBW. Weight  Source: Stated  Current BMI (kg/m2): 16.7  Weight Adjustment For: No Adjustment  BMI Categories: Underweight (BMI less than 22) age over 74    Estimated Daily Nutrient Needs:  Energy Requirements Based On: Kcal/kg  Weight Used for Energy Requirements: Current  Energy (kcal/day): 2952-8413 kcal/d (30-35 kcal/kg, underweight, weight gain)  Weight Used for Protein Requirements: Current  Protein (g/day): 51 gm/d (1.2 gm/kg, underweight, elderly)  Method Used for Fluid Requirements: Other (Comment)  Fluid (ml/day): 1500 mL/d adult min    Nutrition Diagnosis:   Underweight related to early satiety as evidenced by intake 0-25%, weight loss, BMI    Nutrition Interventions:   Food and/or Nutrient Delivery: Start Oral Nutrition Supplement, Modify Current Diet  Nutrition Education/Counseling: No recommendation at this time  Coordination of Nutrition Care: Continue to monitor while inpatient  Plan of Care discussed with: RN, Pt    Goals:  Goals: Meet at least 75% of estimated needs, PO intake 50% or greater, within 7 days    Nutrition Monitoring and Evaluation:   Behavioral-Environmental Outcomes: None Identified  Food/Nutrient Intake Outcomes: Diet Advancement/Tolerance, Supplement Intake, Food and Nutrient Intake  Physical Signs/Symptoms Outcomes: Biochemical Data, Meal Time Behavior, Weight, Nutrition Focused Physical Findings    Discharge Planning:    Continue current diet, Continue Oral Nutrition Supplement     Terrall Laity, RD  Contact: ext. [phone].

## 2022-03-02 LAB — CULTURE, URINE: Colony count: 20000

## 2022-03-02 MED FILL — CEFTRIAXONE SODIUM 1 G IJ SOLR: 1 g | INTRAMUSCULAR | Qty: 1000

## 2022-03-02 MED FILL — ACETAMINOPHEN EXTRA STRENGTH 500 MG PO TABS: 500 MG | ORAL | Qty: 2

## 2022-03-02 MED FILL — ASPIRIN LOW STRENGTH 81 MG PO CHEW: 81 MG | ORAL | Qty: 4

## 2022-03-02 MED FILL — METOPROLOL SUCCINATE ER 50 MG PO TB24: 50 MG | ORAL | Qty: 1

## 2022-03-02 MED FILL — MIRTAZAPINE 30 MG PO TABS: 30 MG | ORAL | Qty: 1

## 2022-03-02 MED FILL — PANTOPRAZOLE SODIUM 20 MG PO TBEC: 20 MG | ORAL | Qty: 1

## 2022-03-02 MED FILL — OXYCODONE HCL 5 MG PO TABS: 5 MG | ORAL | Qty: 1

## 2022-03-02 MED FILL — ATORVASTATIN CALCIUM 40 MG PO TABS: 40 MG | ORAL | Qty: 1

## 2022-03-02 NOTE — Plan of Care (Signed)
Problem: Pain  Goal: Verbalizes/displays adequate comfort level or baseline comfort level  03/02/2022 0805 by Currie Paris, LPN  Outcome: Progressing  03/02/2022 0516 by Glenis Smoker, RN  Outcome: Progressing     Problem: Safety - Adult  Goal: Free from fall injury  03/02/2022 0805 by Currie Paris, LPN  Outcome: Progressing  03/02/2022 0516 by Glenis Smoker, RN  Outcome: Progressing

## 2022-03-02 NOTE — Plan of Care (Signed)
Problem: Discharge Planning  Goal: Discharge to home or other facility with appropriate resources  Outcome: Progressing  Flowsheets (Taken 03/01/2022 2000)  Discharge to home or other facility with appropriate resources:   Identify barriers to discharge with patient and caregiver   Arrange for needed discharge resources and transportation as appropriate     Problem: Pain  Goal: Verbalizes/displays adequate comfort level or baseline comfort level  Outcome: Progressing     Problem: Skin/Tissue Integrity  Goal: Absence of new skin breakdown  Description: 1.  Monitor for areas of redness and/or skin breakdown  2.  Assess vascular access sites hourly  3.  Every 4-6 hours minimum:  Change oxygen saturation probe site  4.  Every 4-6 hours:  If on nasal continuous positive airway pressure, respiratory therapy assess nares and determine need for appliance change or resting period.  Outcome: Progressing     Problem: Safety - Adult  Goal: Free from fall injury  Outcome: Progressing

## 2022-03-02 NOTE — Progress Notes (Signed)
..Discharge Summary    Date: 03/02/2022  Patient Name: [name]    Date of Birth: January 13, 1946     Age: 77 y.o.    Admit Date: 02/25/2022  Discharge Date:  Discharge Condition:    Admission Diagnosis  Fall, initial encounter [W19.XXXA];Fracture, intertrochanteric, right femur, closed, initial encounter ([place]) [S72.141A];Closed 2-part intertrochanteric fracture of right femur, initial encounter ([place]) [S72.141A]      Discharge Diagnosis  Principal Problem (Resolved):    Fracture, intertrochanteric, right femur, closed, initial encounter ([place])  Active Problems:    S/P ORIF (open reduction internal fixation) fracture  Resolved Problems:    Intertrochanteric fracture of right femur, closed, initial encounter [facility])    [facility] Hospital Stay  Narrative of Hospital Course:      Consultants:  [place]    Surgeries/procedures Performed:      Treatments:            Discharge Plan/Disposition:  Home    Hospital/Incidental Findings Requiring Follow Up:    Patient Instructions:    Diet:    Activity:  For number of days (if applicable):      Other Instructions:    Provider Follow-Up:   No follow-ups on file.     Significant Diagnostic Studies:    Recent Labs:  Admission on 02/25/2022  No results displayed because visit has over 200 results.    ------------    Radiology last 7 days:  FL LESS THAN 1 HOUR    Result Date: 02/27/2022  Fluoroscopic imaging during procedure. REPORT PROVIDED FOR COMPLIANCE ONLY AT NO CHARGE.     CT HEAD WO CONTRAST    Result Date: 02/26/2022  No acute abnormality.     CT HIP RIGHT WO CONTRAST    Result Date: 02/26/2022  Redemonstrated acute intertrochanteric right femur fracture.     XR KNEE RIGHT (3 VIEWS)    Result Date: 02/26/2022  No acute fracture of the right wrist or right knee.     XR WRIST RIGHT (MIN 3 VIEWS)    Result Date: 02/26/2022  No acute fracture of the right wrist or right knee.     XR HIP 2-3 VW W PELVIS RIGHT    Result Date: 02/25/2022  Acute nondisplaced right  intertrochanteric femur fracture.       @DCPENDLAB @    Discharge Medications    Current Discharge Medication List    START taking these medications    aspirin 81 MG chewable tablet  Take 4 tablets by mouth daily  Qty: 120 tablet Refills: 0          Current Discharge Medication List        Current Discharge Medication List    CONTINUE these medications which have NOT CHANGED    metoprolol succinate (TOPROL XL) 25 MG extended release tablet  Take 1 tablet by mouth daily    vitamin D (ERGOCALCIFEROL) 1.25 MG (50000 UT) CAPS capsule  Take 1 capsule by mouth once a week    mirtazapine (REMERON) 30 MG tablet  Take 1 tablet by mouth nightly    atorvastatin (LIPITOR) 40 MG tablet  Take 1 tablet by mouth daily    albuterol sulfate HFA (PROVENTIL;VENTOLIN;PROAIR) 108 (90 Base) MCG/ACT inhaler  albuterol sulfate HFA 90 mcg/actuation aerosol inhaler   INHALE 2 PUFFS EVERY 6 HOURS AS NEEDED    omeprazole (PRILOSEC) 20 MG delayed release capsule  Take 1 capsule by mouth daily  Current Discharge Medication List    STOP taking these medications    aspirin 81 MG EC tablet  Comments:  Reason for Stopping:          Time Spent on Discharge:  minutes were spent in patient examination, evaluation, counseling as well as medication reconciliation, prescriptions for required medications, discharge plan, and follow up.    Electronically signed by Currie Paris, LPN on 0/9/32 at 3:55 PM EST

## 2022-03-02 NOTE — Care Coordination-Inpatient (Addendum)
CM met with patient this morning. Patient reports she is already open with [facility] and prefers to continue services with their company.    Clinicals uploaded to [employer].    Patient states she already has necessary DME in her home.     DCP: Home with [place] 03-03-2022    1115: Per therapy team, patient will need wheelchair. Order placed by Ortho.    Referral sent via [facility])     [facility] will provide wheel chair transport. Pick up time: 3:30pm. Primary nurse made aware.       Home address confirmed by patient:    [place], [place] [address]      Transition of Care Plan:    RUR: 18%  Prior Level of Functioning: Independent  Disposition: Home w/HH and DME  If SNF or IPR: Date FOC offered: N/A  Date FOC received: N/A  Accepting facility: N/A  Date authorization started with reference number:   Date authorization received and expires:   Follow up appointments: Discharge summary  DME needed: Wheelchair  Transportation at discharge: Hospital to Home (w/c transport)  IM/IMM Medicare/Tricare letter given: Yes  Is patient a Veteran and connected with VA? N/A   If yes, was [place] transfer form completed and VA notified?   Caregiver Contact: Per patient  Discharge Caregiver contacted prior to discharge?   Care Conference needed? N/A  Barriers to discharge: N/A

## 2022-03-02 NOTE — Progress Notes (Signed)
Spoke with patient this morning to make sure she has DME equipment at home, which she states she does.  Will sign off from this patient from an orthopedic standpoint.

## 2022-03-02 NOTE — Progress Notes (Signed)
OCCUPATIONAL THERAPY TREATMENT  Patient: [name] (77 y.o. female)  Date: 03/02/2022  Primary Diagnosis: Fall, initial encounter [W19.XXXA]  Fracture, intertrochanteric, right femur, closed, initial encounter ([place]) [S72.141A]  Closed 2-part intertrochanteric fracture of right femur, initial encounter ([place]) [S72.141A]  Procedure(s) (LRB):  OPEN REDUCTION INTERNAL FIXATION RIGHT HIP, SYNTHES SHORT TFN; INTERTROCHANTERIC HIP FRACTURE (Right) 3 Days Post-Op   Precautions: Fall Risk, Weight Bearing Right Lower Extremity Weight Bearing: Toe Touch Weight Bearing              Recommendations for nursing mobility: Out of bed to chair for meals, Encourage HEP in prep for ADLs/mobility; see handout for details, Use of BSC for toileting , AD and gt belt for bed to chair , and Assist x1    In place during session: External Catheter and EKG/telemetry   Chart, occupational therapy assessment, plan of care, and goals were reviewed.  ASSESSMENT  Patient continues with skilled OT services and is progressing towards goals. Pt semi supine in bed upon COTA arrival, agreeable to session. Pt A&O x 4. Pt required overall min A with bed mobility, sts and t/f to recliner w/ extra time to complete all tasks.  Pt completed UE therex, see grid below for details, to maintain/ increase strength and endurance to aid in adl performance.   Discussed d/c plan.. ie.. benefits from SNF to increase strength/ independence to decrease fall risks.  Pt adamant that she is going home and that she has people to help her. Discussed DME, pt reports that she would benefit from a W/C.  Discussed need of w/c with ortho PA and CM.   (See below for objective details and assist levels).     Overall pt tolerated session fair today with frequent rest breaks. Potential barriers for safe discharge: pt has poor safety awareness, pt is a high fall risk, pt is not safe to be alone, and concern for pt safely navigating or managing the home environment. Current OT  recommendations for discharge [place]. Will continue to benefit from skilled OT services, and will continue to progress as tolerated.      Start of Session End of Session   SPO2 (%) 94 96   Heart Rate (BPM) 87 88     GOALS:    Problem: Occupational Therapy - Adult  Goal: By Discharge: Performs self-care activities at highest level of function for planned discharge setting.  See evaluation for individualized goals.  Description: FUNCTIONAL STATUS PRIOR TO ADMISSION:  pt reports she was IND w/ functional mobility and ADLs; h/o hip fx on LLE    HOME SUPPORT: Pt lives with roommates; she reports her roommate is home 24/7     Occupational Therapy Goals:  Initiated 02/28/2022  Patient/Family stated goal: I want to get better  Patient will perform grooming in standing with Modified [place] within 7 day(s).  Patient will perform UB bathing with Independence within 7 day(s).  Patient will perform LB bathing with Independence within 7 day(s).  Patient will perform toilet transfers with Modified [place]  within 7 day(s).  Patient will perform all aspects of toileting with Independence within 7 day(s).  Patient will participate in upper extremity therapeutic exercise/activities with Independence within 7 day(s).    Outcome: Progressing        PLAN :  Patient continues to benefit from skilled intervention to address functional impairments. Continue treatment per established plan of care to address goals.    Recommend next OT session: Toileting and Seated grooming  Recommendation for discharge: (in order for the patient to meet his/her long term goals): [place]    IF patient discharges home will need the following DME: wheelchair      SUBJECTIVE:   Patient stated "I have people that can help me at home."      OBJECTIVE DATA SUMMARY:   Cognitive/Behavioral Status:  Orientation  Overall Orientation Status: Within Functional Limits  Orientation Level: Oriented X4  Cognition  Overall  Cognitive Status: Exceptions  Arousal/Alertness: Appropriate responses to stimuli  Following Commands: Follows one step commands consistently;Follows multistep commands with increased time  Attention Span: Appears intact  Memory: Appears intact  Safety Judgement: Decreased awareness of need for assistance;Decreased awareness of need for safety  Problem Solving: Assistance required to generate solutions;Assistance required to identify errors made;Decreased awareness of errors  Insights: Decreased awareness of deficits  Initiation: Requires cues for some  Sequencing: Requires cues for some    Functional Mobility and Transfers for ADLs:  Bed Mobility:  Bed Mobility Training  Bed Mobility Training: Yes  Interventions: Verbal cues;Safety awareness training;Tactile cues  Rolling: Contact-guard assistance;Additional time;Assist X1  Supine to Sit: Minimum assistance;Assist X1;Additional time  Scooting: Minimum assistance;Assist X1;Additional time    Transfers:  Pharmacologist: Yes  Overall Level of Assistance: Minimum assistance;Assist X1;Adaptive equipment;Additional time  Interventions: Verbal cues;Safety awareness training  Sit to Stand: Minimum assistance;Assist X1;Adaptive equipment;Additional time  Stand to Sit: Minimum assistance;Contact-guard assistance;Assist X1;Additional time  Bed to Chair: Minimum assistance;Assist X1;Adaptive equipment      Balance:  Balance  Sitting: Intact  Standing: Impaired  Standing - Static: Fair;Good;Constant support  Standing - Dynamic: Fair;Constant support      ADL Intervention:    Grooming: Setup   Grooming Skilled Clinical Factors: to wash face and hand sitting eob.      Therapeutic exercise:  Completed sitting in recliner, min vc's for proper technique and self pacing.  Exercise Sets Reps AROM AAROM PROM Self PROM Comments   Shoulder flex/ext 1 12 [x]  []  []  []     Elbow flex/ext 1 12 [x]  []  []  []     Wrist flex/ext 1 12 [x]  []  []  []     Hand flex/ ext 1 12 [x]  []   []  []       Pain Rating:  5/10 RLE  Pain Intervention(s):   pain is at a level acceptable to the patient    Activity Tolerance:   Fair  and requires rest breaks    After treatment patient left in no apparent distress:   Bed locked and returned to lowest position, Patient left in no apparent distress sitting up in chair and Call bell within reach, and nsg updated     COMMUNICATION/EDUCATION:   The patient's plan of care was discussed with: Registered nurse and Physician    Patient Education  Education Given To: Patient  Education Provided: Role of Therapy;Plan of Care;Energy Conservation;IADL Safety  Education Method: Demonstration;Verbal  Education Outcome: Verbalized understanding;Demonstrated understanding;Continued education needed    Thank you for this referral.  Benjaman Pott, OTA  Minutes: 33

## 2022-03-02 NOTE — Discharge Summary (Cosign Needed)
Discharge Summary    Name: [name]  [id number]  Date of Birth: Sep 29, 1945 (Age: 77 y.o.)   Date of Admission: 02/25/2022  Date of Discharge: 03/02/2022  Attending Physician: Mellissa Kohut, MD    Discharge Diagnosis:   Principal Problem (Resolved):    Fracture, intertrochanteric, right femur, closed, initial encounter ([facility])  Active Problems:    S/P ORIF (open reduction internal fixation) fracture  Resolved Problems:    Intertrochanteric fracture of right femur, closed, initial encounter ([facility])    Dizziness       Consultations:  IP CONSULT TO ORTHOPEDIC SURGERY      Brief Admission History/Reason for Admission Per [name], MD:   [name] is  77 y.o. female with COPD, hyperlipidemia presents to the emergency room after a fall at home.  States she was feeling dizzy since morning and not feeling good.  She tried to get up to go to the bathroom, felt dizzy and fell.  She knows that she is falling, did not lose consciousness, shouted to so family members attended immediately.  Denies any chest pain, palpitations, cough or trouble breathing, nausea or vomiting.  No urinary frequency or burning. Found with right intertrochanteric nondisplaced fracture.  Already contacted orthopedic physician, will be following up.    Brief Hospital Course by Main Problems:   Patient was admitted to the hospital for further evaluation and management.X-ray right hip and pelvis show acute nondisplaced right intertrochanteric femur fracture, CT hip pending.  Ortho consulted, suggested surgical fixation.   Vascular duplex carotid bilateral revealed 40 to 50% luminal compromise of right proximal ICA, 50 to 69% stenosis of left proximal ICA, however patent and antegrade flow, patient will need to follow-up with vascular surgery as an outpatient, likely because of dizziness.  Orthopedics performed ORIF right hip, short TFN 2/5.  Patient recovered appropriately.  PT evaluated patient,  recommended SNF, however patient declined and would like to go home with home health.  Echo revealed EF 55 to 60%, normal left ventricular systolic function.  XR thoracic, lumbar spine revealed marked osseous demineralization limiting evaluation, multiple compression deformities of the thoracic vertebral bodies with kyphotic angulation.  WBC, Pro-Cal continuing to trend down.  Nutrition evaluated patient, recommended Ensure Enlive x 2 daily. UA revealed UTI, Keflex initiated, however discontinued as urine culture negative.  Lipid panel and A1c normal.  Patient improved symptomatically back to baseline, dizziness has resolved.  Orthopedics cleared patient for discharge with outpatient follow-up, continue ASA 324, outpatient PT/OT.  New patient referral sent to vascular surgery for carotid artery stenosis.  Patient should also follow-up with PCP within 1 week for recheck of CBC, BMP.  At this time patient is medically stable for discharge, understands and agrees with the plan.  Patient will discharge home with home health and appropriate DME.    Patient remained stable for discharge, pleasantly eating and breakfast.  She states she has had bowel movements, and is working well with PT.  No other complaints at this time.    Discharge Exam:  Patient seen and examined by me on discharge day.  Pertinent Findings:  Patient Vitals for the past 24 hrs:   BP Temp Temp src Pulse Resp SpO2   03/02/22 0823 117/84 97.5 F (36.4 C) Oral 96 -- 95 %   03/02/22 0309 125/75 97.7 F (36.5 C) Oral 83 16 94 %   03/01/22 2147 -- -- -- -- 16 --   03/01/22 2008 134/80 97.7 F (36.5 C) Oral (!) 107 16 94 %  Physical Exam  Constitutional:       General: She is not in acute distress.     Appearance: She is not ill-appearing.   Cardiovascular:      Rate and Rhythm: Normal rate and regular rhythm.      Pulses: Normal pulses.   Pulmonary:      Effort: Pulmonary effort is normal. No respiratory distress.      Breath sounds: Normal breath  sounds. No wheezing.   Abdominal:      General: Abdomen is flat. There is no distension.      Palpations: Abdomen is soft.      Tenderness: There is no abdominal tenderness.   Musculoskeletal:         General: No tenderness. Normal range of motion.      Right lower leg: No edema.      Left lower leg: No edema.      Comments: Right-sided post op dressing clean dry intact   Skin:     General: Skin is warm and dry.   Neurological:      Mental Status: She is alert and oriented to person, place, and time.   Psychiatric:         Mood and Affect: Mood normal.         Behavior: Behavior normal.         Discharge/Recent Laboratory Results:  Recent Labs     03/01/22  0505   NA 144   K 3.6   CL 115*   CO2 26   BUN 16   CREATININE 0.71   GLUCOSE 89   CALCIUM 7.9*   PHOS 2.1*     Recent Labs     03/01/22  0505   HGB 9.9*   HCT 30.8*   WBC 10.1   PLT 297       Discharge Medications:     Medication List        START taking these medications      aspirin 81 MG chewable tablet  Take 4 tablets by mouth daily  Replaces: aspirin 81 MG EC tablet            CONTINUE taking these medications      albuterol sulfate HFA 108 (90 Base) MCG/ACT inhaler  Commonly known as: PROVENTIL;VENTOLIN;PROAIR     atorvastatin 40 MG tablet  Commonly known as: LIPITOR     metoprolol succinate 25 MG extended release tablet  Commonly known as: TOPROL XL     mirtazapine 30 MG tablet  Commonly known as: REMERON     omeprazole 20 MG delayed release capsule  Commonly known as: PRILOSEC     vitamin D 1.25 MG (50000 UT) Caps capsule  Commonly known as: ERGOCALCIFEROL            STOP taking these medications      aspirin 81 MG EC tablet  Replaced by: aspirin 81 MG chewable tablet     metoprolol tartrate 25 MG tablet  Commonly known as: LOPRESSOR               Where to Get Your Medications        These medications were sent to [id number] [place], [place] - [address] - P [phone] - F [phone]  [address] [place] [address]      Phone: [phone]   aspirin 81 MG chewable tablet  DISPOSITION:    Home with Family:       Home with HH/PT/OT/RN: x   SNF/LTC:    SAHR:    OTHER:            Code status:   Recommended diet:  Regular, high fiber, with ensure enlive BID  Recommended activity: activity as tolerated and no driving for today, continue PT/OT with home health  Wound care:  Gently cleanse incision site, cover with dressing until reevaluated by orthopedics      Follow up with:   PCP : Vevelyn Francois, APRN - NP  Vevelyn Francois, APRN - NP  [address]  [address]  [place] [address]  [phone]    Follow up in 1 week(s)  Follow up for recent hospital admission, patient will need recheck of CBC, BMP, assess for resolution of UTI    Jun, Hong Joon, MD  [address]  [address]  [place] [address]  [phone]    Follow up in 2 week(s)  New patient referral.  Patient was admitted to the hospital for dizziness, vascular duplex carotid bilateral revealed 40 to 50% luminal compromise of right proximal ICA, 50 to 69% stenosis of left proximal ICA, however patent and antegrade flow    Muron, Elmon Else, MD  [address]  [address]  [place] [address]  [phone]    Follow up in 1 week(s)  Follow-up for recent hospital admission, patient s/p TFN right hip          Total time in minutes spent coordinating this discharge (includes going over instructions, follow-up, prescriptions, and preparing report for sign off to her PCP) :  35 minutes

## 2022-03-30 ENCOUNTER — Encounter: Payer: MEDICARE | Primary: Family

## 2022-04-11 ENCOUNTER — Encounter: Admit: 2022-04-11 | Discharge: 2022-04-11 | Payer: MEDICARE | Primary: Family

## 2022-04-11 DIAGNOSIS — S72141A Displaced intertrochanteric fracture of right femur, initial encounter for closed fracture: Secondary | ICD-10-CM

## 2022-04-11 NOTE — Progress Notes (Signed)
Subjective:      Patient ID: [name] is a 77 y.o. Caucasian female.    Chief Complaint   Patient presents with    Post-Op Check     R hip   Patient states that she fell. She is not having any pain but it is aching she states     The patient is a 77 year old white female who sustained a ground-level fall that resulted in a right intertrochanteric hip fracture that was repaired with open reduction internal fixation on   February 25, 2022.  There were no postoperative complications.  She is now at home doing well with home health PT, partial weightbearing to the right lower extremity.  She denies any rating pain down the leg, weakness, or paresthesias.  She is here for her first postoperative visit and still has staples in place and the surgical dressing on.     Social History     Occupational History    Not on file   Tobacco Use    Smoking status: Former     Current packs/day: 0.00     Types: Cigarettes     Quit date: 01/24/2019     Years since quitting: 3.2    Smokeless tobacco: Never   Substance and Sexual Activity    Alcohol use: Yes     Alcohol/week: 1.0 standard drink of alcohol    Drug use: Never    Sexual activity: Not on file      Review of Systems  Denies any fevers or chills or colds or upper respiratory infections.  Denies any chest pain or shortness of breath.  Denies any abdominal pain.  Musculoskeletal positive for mild right hip pain on ambulation.  No pain at rest.  Denies any paresthesias or weakness in the lower extremity.  Objective:   Ortho Exam  Evaluation of the right hip shows the surgical incision site to be completely healed.  The staples were removed today.  The proximal incision has healed with a dissolvable suture.  No swelling or erythema and no sign of infection.  Leg lengths are equal.  Neurological exam both lower extremities is normal.    Assessment:     1. Closed displaced intertrochanteric fracture of right femur, initial encounter ([place])    2. S/P ORIF (open reduction internal  fixation) fracture      Patient is doing well.    Plan:     Continue partial weightbearing right lower extremity.  Follow-up in 3 weeks for x-rays of the right hip prior to coming into the office visit.  May start bathing fully.  No dressings necessary to the right hip.

## 2022-04-11 NOTE — Progress Notes (Signed)
Identified pt with two pt identifiers (name and DOB). Reviewed chart in preparation for visit and have obtained necessary documentation.    [name] is a 77 y.o. female  Chief Complaint   Patient presents with    Post-Op Check     R hip   Patient states that she fell. She is not having any pain but it is aching she states     Ht 1.6 m (5\' 3" )   Wt 42.6 kg (94 lb)   BMI 16.65 kg/m     1. Have you been to the ER, urgent care clinic since your last visit?  Hospitalized since your last visit?no    2. Have you seen or consulted any other health care providers outside of the [facility] since your last visit?  Include any pap smears or colon screening. no

## 2022-04-12 NOTE — Telephone Encounter (Signed)
[facility] called in requesting to know why Dr. Jaci Standard ordered imaging for the patient as stated in the AVS. Upon checking the patient's release of PHI, I advised Barnett Applebaum I would be unable to share that information as the patient did not authorize [facility] to obtain her PHI. Advised I would need verbal consent from the patient for this phone call today otherwise the patient would have to complete an updated PHI release form in office. Barnett Applebaum understood and stated she would call back tomorrow.

## 2022-05-05 ENCOUNTER — Encounter: Payer: MEDICARE | Primary: Family

## 2022-05-15 ENCOUNTER — Encounter: Payer: MEDICARE | Primary: Family

## 2022-05-24 ENCOUNTER — Inpatient Hospital Stay: Admit: 2022-05-24 | Payer: MEDICARE | Primary: Family

## 2022-05-24 DIAGNOSIS — S72141A Displaced intertrochanteric fracture of right femur, initial encounter for closed fracture: Secondary | ICD-10-CM

## 2022-05-29 ENCOUNTER — Ambulatory Visit: Admit: 2022-05-29 | Discharge: 2022-05-29 | Payer: MEDICARE | Primary: Family

## 2022-05-29 DIAGNOSIS — Z9889 Other specified postprocedural states: Secondary | ICD-10-CM

## 2022-05-29 NOTE — Progress Notes (Signed)
Identified pt with two pt identifiers (name and DOB). Reviewed chart in preparation for visit and have obtained necessary documentation.    [name] is a 77 y.o. female  Chief Complaint   Patient presents with    Follow-up     Rt hip      BP 124/84 (Site: Right Upper Arm, Position: Sitting, Cuff Size: Small Adult)   Pulse 97   Temp 97.8 F (36.6 C) (Oral)   Resp 18   Ht 1.6 m (5\' 3" )   Wt 42.6 kg (94 lb)   SpO2 95%   BMI 16.65 kg/m     1. Have you been to the ER, urgent care clinic since your last visit?  Hospitalized since your last visit?no    2. Have you seen or consulted any other health care providers outside of the [facility] since your last visit?  Include any pap smears or colon screening. no

## 2022-05-29 NOTE — Progress Notes (Signed)
Subjective:      Patient ID: [name] is a 77 y.o. Caucasian female.    Chief Complaint   Patient presents with    Follow-up     Rt hip       Post-Op Check       R hip   Patient states that she fell. She is not having any pain but it is aching she states      The patient is a 77 year old white female who sustained a ground-level fall on 02/25/2022 that resulted in a right intertrochanteric hip fracture that was repaired with open reduction internal fixation by me on February 27, 2022.  There were no postoperative complications.      She is now at home doing well with home health PT, full weightbearing to the right lower extremity.  Caretaker who accompanied her states that she has not been walking very much and continues to complain of some thigh and lower leg pain due to muscle weakness.  She is very deconditioned.  She denies any rating pain down the leg or paresthesias.  She is here for her second postoperative visit.  There have not been any surgical incision problems.    Social History     Occupational History    Not on file   Tobacco Use    Smoking status: Former     Current packs/day: 0.00     Types: Cigarettes     Quit date: 01/24/2019     Years since quitting: 3.3    Smokeless tobacco: Never   Substance and Sexual Activity    Alcohol use: Yes     Alcohol/week: 1.0 standard drink of alcohol    Drug use: Never    Sexual activity: Not on file      Review of Systems  Denies any fevers or chills or colds or upper respiratory infections. Denies any chest pain or shortness of breath. Denies any abdominal pain. Musculoskeletal positive for mild right hip pain on ambulation. No pain at rest. Denies any paresthesias or weakness in the lower extremity.   Objective:   Ortho Exam  Evaluation of the right hip shows the surgical incision site to be completely healed. The proximal incision has healed with a dissolvable suture. No swelling or erythema and no sign of infection. Leg lengths are equal. Neurological exam both  lower extremities is normal.  She is walking normally with a walker full weightbearing without a limp.  She does have some quadriceps atrophy on the right compared to the left.    Assessment:     1. S/P ORIF (open reduction internal fixation) fracture    2. Closed 2-part intertrochanteric fracture of proximal end of right femur, with routine healing, subsequent encounter      X-rays of the right hip shows excellent anatomic alignment of the intertrochanteric fracture maintained, but the fracture line is still visible.  The [employer] trochanteric femoral nail was in good position without change.  No complications.  Radiographic results are shared with the patient and her caretaker today.    Plan:     Continue full weightbearing as tolerated with a walker.  Follow-up in 4 weeks for repeat x-ray right hip AP and lateral views.

## 2022-06-28 ENCOUNTER — Encounter: Payer: MEDICARE | Primary: Family

## 2022-10-08 NOTE — Anesthesia Postprocedure Evaluation (Signed)
 Department of Anesthesiology  Postprocedure Note    Patient: [name]  MRN: [id number]  Birthdate: Oct 27, 1945  Date of evaluation: 10/08/2022    Procedure Summary       Date: 02/27/22 Room / Location: [facility] OR 08 / [facility]    Anesthesia Start: 0953 Anesthesia Stop: 1138    Procedure: OPEN REDUCTION INTERNAL FIXATION RIGHT HIP, SYNTHES SHORT TFN; INTERTROCHANTERIC HIP FRACTURE (Right) Diagnosis:       Closed displaced intertrochanteric fracture of right femur, initial encounter ([facility])      (Closed displaced intertrochanteric fracture of right femur, initial encounter ([facility]) [D27.858J])    Surgeons: Dietrich Alm PARAS, MD Responsible Provider: Lorrayne Lamar LITTIE Mickey., MD    Anesthesia Type: general ASA Status: 3            Anesthesia Type: No value filed.    Aldrete Phase I: Aldrete Score: 9    Aldrete Phase II:      Anesthesia Post Evaluation    Patient location during evaluation: PACU  Patient participation: complete - patient cannot participate  Level of consciousness: awake  Pain score: 0  Airway patency: patent  Nausea & Vomiting: no nausea and no vomiting  Cardiovascular status: hemodynamically stable  Respiratory status: acceptable  Hydration status: stable  Multimodal analgesia pain management approach    No notable events documented.
